# Patient Record
Sex: Female | Born: 1945 | Race: White | Hispanic: No | Smoking: Former smoker
Health system: Southern US, Community
[De-identification: ages and names within clinical notes are randomized; demographics above are authoritative.]

## PROBLEM LIST (undated history)

## (undated) DIAGNOSIS — Z9989 Dependence on other enabling machines and devices: Secondary | ICD-10-CM

## (undated) DIAGNOSIS — I509 Heart failure, unspecified: Secondary | ICD-10-CM

## (undated) DIAGNOSIS — R1310 Dysphagia, unspecified: Secondary | ICD-10-CM

## (undated) DIAGNOSIS — Z720 Tobacco use: Secondary | ICD-10-CM

## (undated) DIAGNOSIS — E119 Type 2 diabetes mellitus without complications: Secondary | ICD-10-CM

## (undated) DIAGNOSIS — I10 Essential (primary) hypertension: Secondary | ICD-10-CM

## (undated) DIAGNOSIS — E785 Hyperlipidemia, unspecified: Secondary | ICD-10-CM

## (undated) DIAGNOSIS — J9690 Respiratory failure, unspecified, unspecified whether with hypoxia or hypercapnia: Secondary | ICD-10-CM

## (undated) DIAGNOSIS — F419 Anxiety disorder, unspecified: Secondary | ICD-10-CM

## (undated) DIAGNOSIS — J449 Chronic obstructive pulmonary disease, unspecified: Secondary | ICD-10-CM

## (undated) DIAGNOSIS — I639 Cerebral infarction, unspecified: Secondary | ICD-10-CM

## (undated) DIAGNOSIS — R600 Localized edema: Secondary | ICD-10-CM

## (undated) HISTORY — PX: FOOT SURGERY: SHX648

## (undated) HISTORY — PX: CATARACT EXTRACTION: SUR2

## (undated) HISTORY — PX: HIP SURGERY: SHX245

---

## 1973-07-23 DIAGNOSIS — I639 Cerebral infarction, unspecified: Secondary | ICD-10-CM

## 1973-07-23 HISTORY — DX: Cerebral infarction, unspecified: I63.9

## 2001-09-19 ENCOUNTER — Inpatient Hospital Stay (HOSPITAL_COMMUNITY): Admission: AC | Admit: 2001-09-19 | Discharge: 2001-10-09 | Payer: Self-pay

## 2001-09-19 ENCOUNTER — Encounter: Payer: Self-pay | Admitting: General Surgery

## 2001-09-20 ENCOUNTER — Encounter: Payer: Self-pay | Admitting: General Surgery

## 2001-10-03 ENCOUNTER — Encounter: Payer: Self-pay | Admitting: Orthopedic Surgery

## 2001-10-09 ENCOUNTER — Inpatient Hospital Stay (HOSPITAL_COMMUNITY)
Admission: RE | Admit: 2001-10-09 | Discharge: 2001-10-23 | Payer: Self-pay | Admitting: Physical Medicine & Rehabilitation

## 2001-10-16 ENCOUNTER — Encounter: Payer: Self-pay | Admitting: Physical Medicine & Rehabilitation

## 2001-10-22 ENCOUNTER — Encounter: Payer: Self-pay | Admitting: Physical Medicine & Rehabilitation

## 2002-02-12 ENCOUNTER — Encounter: Payer: Self-pay | Admitting: Family Medicine

## 2002-02-12 ENCOUNTER — Ambulatory Visit (HOSPITAL_COMMUNITY): Admission: RE | Admit: 2002-02-12 | Discharge: 2002-02-12 | Payer: Self-pay | Admitting: Family Medicine

## 2003-12-07 ENCOUNTER — Ambulatory Visit (HOSPITAL_COMMUNITY): Admission: RE | Admit: 2003-12-07 | Discharge: 2003-12-07 | Payer: Self-pay | Admitting: Internal Medicine

## 2004-04-06 ENCOUNTER — Ambulatory Visit (HOSPITAL_COMMUNITY): Admission: RE | Admit: 2004-04-06 | Discharge: 2004-04-06 | Payer: Self-pay | Admitting: Family Medicine

## 2004-04-30 ENCOUNTER — Inpatient Hospital Stay (HOSPITAL_COMMUNITY): Admission: EM | Admit: 2004-04-30 | Discharge: 2004-05-04 | Payer: Self-pay | Admitting: *Deleted

## 2004-05-03 ENCOUNTER — Ambulatory Visit: Payer: Self-pay | Admitting: Physical Medicine & Rehabilitation

## 2004-06-27 ENCOUNTER — Ambulatory Visit (HOSPITAL_COMMUNITY): Admission: RE | Admit: 2004-06-27 | Discharge: 2004-06-27 | Payer: Self-pay | Admitting: Specialist

## 2004-07-11 ENCOUNTER — Encounter (HOSPITAL_COMMUNITY): Admission: RE | Admit: 2004-07-11 | Discharge: 2004-08-10 | Payer: Self-pay | Admitting: Specialist

## 2006-03-07 ENCOUNTER — Emergency Department (HOSPITAL_COMMUNITY): Admission: EM | Admit: 2006-03-07 | Discharge: 2006-03-07 | Payer: Self-pay | Admitting: Emergency Medicine

## 2006-03-15 ENCOUNTER — Inpatient Hospital Stay (HOSPITAL_COMMUNITY): Admission: EM | Admit: 2006-03-15 | Discharge: 2006-03-22 | Payer: Self-pay | Admitting: Emergency Medicine

## 2006-10-06 ENCOUNTER — Ambulatory Visit: Payer: Self-pay | Admitting: Orthopedic Surgery

## 2006-10-06 ENCOUNTER — Inpatient Hospital Stay (HOSPITAL_COMMUNITY): Admission: EM | Admit: 2006-10-06 | Discharge: 2006-10-14 | Payer: Self-pay | Admitting: Emergency Medicine

## 2006-10-08 ENCOUNTER — Encounter: Payer: Self-pay | Admitting: Orthopedic Surgery

## 2006-12-10 ENCOUNTER — Ambulatory Visit: Payer: Self-pay | Admitting: Orthopedic Surgery

## 2007-02-24 ENCOUNTER — Ambulatory Visit: Payer: Self-pay | Admitting: Orthopedic Surgery

## 2007-07-09 ENCOUNTER — Encounter: Payer: Self-pay | Admitting: Orthopedic Surgery

## 2007-08-01 DIAGNOSIS — T380X5A Adverse effect of glucocorticoids and synthetic analogues, initial encounter: Secondary | ICD-10-CM

## 2007-08-01 DIAGNOSIS — E099 Drug or chemical induced diabetes mellitus without complications: Secondary | ICD-10-CM

## 2007-08-14 ENCOUNTER — Telehealth: Payer: Self-pay | Admitting: Orthopedic Surgery

## 2007-08-14 ENCOUNTER — Encounter: Payer: Self-pay | Admitting: Orthopedic Surgery

## 2007-09-09 ENCOUNTER — Ambulatory Visit: Payer: Self-pay | Admitting: Orthopedic Surgery

## 2007-09-09 DIAGNOSIS — S72143A Displaced intertrochanteric fracture of unspecified femur, initial encounter for closed fracture: Secondary | ICD-10-CM

## 2007-09-09 DIAGNOSIS — M76899 Other specified enthesopathies of unspecified lower limb, excluding foot: Secondary | ICD-10-CM | POA: Insufficient documentation

## 2007-09-09 DIAGNOSIS — M7150 Other bursitis, not elsewhere classified, unspecified site: Secondary | ICD-10-CM

## 2007-09-11 ENCOUNTER — Emergency Department (HOSPITAL_COMMUNITY): Admission: EM | Admit: 2007-09-11 | Discharge: 2007-09-11 | Payer: Self-pay | Admitting: Emergency Medicine

## 2007-10-12 ENCOUNTER — Emergency Department (HOSPITAL_COMMUNITY): Admission: EM | Admit: 2007-10-12 | Discharge: 2007-10-12 | Payer: Self-pay | Admitting: Emergency Medicine

## 2007-12-09 ENCOUNTER — Emergency Department (HOSPITAL_COMMUNITY): Admission: EM | Admit: 2007-12-09 | Discharge: 2007-12-09 | Payer: Self-pay | Admitting: Emergency Medicine

## 2008-01-11 ENCOUNTER — Emergency Department (HOSPITAL_COMMUNITY): Admission: EM | Admit: 2008-01-11 | Discharge: 2008-01-11 | Payer: Self-pay | Admitting: Emergency Medicine

## 2008-01-28 ENCOUNTER — Emergency Department (HOSPITAL_COMMUNITY): Admission: EM | Admit: 2008-01-28 | Discharge: 2008-01-28 | Payer: Self-pay | Admitting: Emergency Medicine

## 2008-02-09 ENCOUNTER — Emergency Department (HOSPITAL_COMMUNITY): Admission: EM | Admit: 2008-02-09 | Discharge: 2008-02-09 | Payer: Self-pay | Admitting: Emergency Medicine

## 2008-02-19 ENCOUNTER — Emergency Department (HOSPITAL_COMMUNITY): Admission: EM | Admit: 2008-02-19 | Discharge: 2008-02-19 | Payer: Self-pay | Admitting: Emergency Medicine

## 2008-03-04 ENCOUNTER — Emergency Department (HOSPITAL_COMMUNITY): Admission: EM | Admit: 2008-03-04 | Discharge: 2008-03-04 | Payer: Self-pay | Admitting: Emergency Medicine

## 2008-03-15 ENCOUNTER — Emergency Department (HOSPITAL_COMMUNITY): Admission: EM | Admit: 2008-03-15 | Discharge: 2008-03-15 | Payer: Self-pay | Admitting: Emergency Medicine

## 2008-03-25 ENCOUNTER — Emergency Department (HOSPITAL_COMMUNITY): Admission: EM | Admit: 2008-03-25 | Discharge: 2008-03-25 | Payer: Self-pay | Admitting: Emergency Medicine

## 2008-04-04 ENCOUNTER — Emergency Department (HOSPITAL_COMMUNITY): Admission: EM | Admit: 2008-04-04 | Discharge: 2008-04-04 | Payer: Self-pay | Admitting: Emergency Medicine

## 2008-04-17 ENCOUNTER — Emergency Department (HOSPITAL_COMMUNITY): Admission: EM | Admit: 2008-04-17 | Discharge: 2008-04-17 | Payer: Self-pay | Admitting: Emergency Medicine

## 2008-05-07 ENCOUNTER — Emergency Department (HOSPITAL_COMMUNITY): Admission: EM | Admit: 2008-05-07 | Discharge: 2008-05-07 | Payer: Self-pay | Admitting: Emergency Medicine

## 2008-05-20 ENCOUNTER — Emergency Department (HOSPITAL_COMMUNITY): Admission: EM | Admit: 2008-05-20 | Discharge: 2008-05-20 | Payer: Self-pay | Admitting: Emergency Medicine

## 2008-07-22 ENCOUNTER — Emergency Department (HOSPITAL_COMMUNITY): Admission: EM | Admit: 2008-07-22 | Discharge: 2008-07-22 | Payer: Self-pay | Admitting: Emergency Medicine

## 2008-08-09 ENCOUNTER — Emergency Department (HOSPITAL_COMMUNITY): Admission: EM | Admit: 2008-08-09 | Discharge: 2008-08-09 | Payer: Self-pay | Admitting: Emergency Medicine

## 2008-09-08 ENCOUNTER — Emergency Department (HOSPITAL_COMMUNITY): Admission: EM | Admit: 2008-09-08 | Discharge: 2008-09-08 | Payer: Self-pay | Admitting: Emergency Medicine

## 2008-09-20 ENCOUNTER — Emergency Department (HOSPITAL_COMMUNITY): Admission: EM | Admit: 2008-09-20 | Discharge: 2008-09-20 | Payer: Self-pay | Admitting: Emergency Medicine

## 2008-09-30 ENCOUNTER — Emergency Department (HOSPITAL_COMMUNITY): Admission: EM | Admit: 2008-09-30 | Discharge: 2008-09-30 | Payer: Self-pay | Admitting: Emergency Medicine

## 2009-10-17 ENCOUNTER — Ambulatory Visit (HOSPITAL_COMMUNITY): Admission: RE | Admit: 2009-10-17 | Discharge: 2009-10-17 | Payer: Self-pay | Admitting: Family Medicine

## 2009-11-01 ENCOUNTER — Encounter: Admission: RE | Admit: 2009-11-01 | Discharge: 2009-11-01 | Payer: Self-pay | Admitting: Family Medicine

## 2010-08-13 ENCOUNTER — Encounter: Payer: Self-pay | Admitting: Family Medicine
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
## 2010-12-08 NOTE — Op Note (Signed)
[facility]  Patient:    Tara Park, Tara Park Visit Number: [id number] MRN: [id number]          Service Type: SUR Location: 4W 0471 [id number] Attending Physician:  Verlee Rossetti. Dictated by:   Sherri Rad, M.D. Proc. Date: 10/03/01 Admit Date:  10/03/2001                             Operative Report  PREOPERATIVE DIAGNOSIS:  Closed right calcaneus fracture, intraarticular, Sanders III.  POSTOPERATIVE DIAGNOSIS:  Closed right calcaneus fracture, intraarticular, Sanders III.  OPERATION:  Open reduction, internal fixation, right intraarticular calcaneus fracture.  ANESTHESIA:  General endotracheal tube.  SURGEON:  Sherri Rad, M.D.  ASSISTANT:  Almedia Balls. Ranell Patrick, M.D.  ESTIMATED BLOOD LOSS:  Minimal.  TOURNIQUET TIME:  Two hours.  COMPLICATIONS:  None.  DISPOSITION:  Stable to PAR.  INDICATIONS:  This is a 65 year old female, who sustained a right calcaneus fracture as well as a tibial plateau fracture.  The tibial plateau fracture is being managed by Dr. Ranell Patrick, currently conservatively.  As for the right calcaneus fracture, she was explained the risks of the procedure which include infection, neurovascular injury, wound breakdown especially if she continues to smoke, stiffness, arthritis, DVT, and possible PE were all explained. Questions were answered.  DESCRIPTION OF OPERATION:  The patient brought to the operating room and placed initially in the supine position.  After adequate general endotracheal tube anesthesia was administered as well as Ancef 1 g IV piggyback, the patient was then placed in the lateral decubitus position with the operative site up after a proximal thigh tourniquet was placed in the right lower extremity.  The right lower extremity was then prepped and draped in a sterile manner after all bony prominences were well-padded.  We then made an L-shaped extensile approach to the calcaneus.  Sural nerve was identified  and protected throughout the entire procedure.  Peroneal tendons were identified distally and protected as well.  A large full-thickness flap off the calcaneus was then elevated, taking care to protect the soft tissues of the flap.  Two 1.6 mm K-wires were then placed in the lateral aspect of the talus and bent up to protect the flap, and the flap was not touched through the remaining portion of the procedure.  The lateral wall of the calcaneus was removed, and there was a depressed lateral portion of the posterior facet that was removed as well for later fixation.  There was an extremely large portion of the posterior facet that was no perpendicular to the bottom of the foot that was depressed.  This was then elevated and freed up as well.  We then identified the primary fracture line, freed this up as well.  We then reduced the calcaneal tuber and fired two 1.6 mm K-wires to reduce the tuber back to the medial wall.  This was visualized under C-arm guidance in the axial of the hindfoot as well as lateral planes of the hindfoot.  Once this was done, we then reduced the large portion of the posterior facet to the medial portion of the posterior facet.  This was then reduced visually and two 1.6 mm K-wires were then placed.  This was visualized under C-arm guidance in the lateral Brodens view as well as axial view of the calcaneus.  The two K-wires were then cut flush with the bone, and then the lateral portion of the posterior facet was then  placed and held reduced with K-wire.  We then placed a 3.5 mm fully-threaded cortical lag screw into this piece and had excellent purchase and compression.  The joint was visualized, and it was anatomic.  We then placed approximately 10 cc of cancellous croutons into the large opened space within the calcaneus.  Once this was done, we then reduced the angled Glissanes fragment to the posterior facet, and a 1.6 mm K-wire was fired, not only axially but  also transversely which held this fragment anatomic.  We then fashioned a Letournel plate to the lateral side of the wall.  We then placed this on the back table.  We then placed the lateral wall piece back to the calcaneus and then applied the plate.  We then placed several 3.5 mm fully-threaded cortical screws in a lag fashion, and this compressed the lateral wall beautifully.  Once this was done, we then cut flush the posterior facet K-wire as well as the angled Glissanes fragment K-wire fixation.  We copiously irrigated the wound with normal saline.  Final x-rays were taken in the lateral axial views of the hindfoot as well as Brodens views of the subtalar joint as well.  The subtalar joint was visualized, and it was anatomically reduced as well.  Once the wound was copiously irrigated with normal saline, the tourniquet was deflated, and bleeding was minimal.  We then closed the deep, soft tissues with 2-0 Vicryl in a series of sutures and then closed them to apply universal pressure over the entire flap as we closed it. The skin was closed with Alcauer-Donati suture technique.  Prior to the procedure, the skin had excellent wrinkles in the skin.  There was ecchymosis. There was no fracture blisters over the lateral aspect of the foot as well. Sterile dressing was applied.  We then obtained an AP and lateral view of the tibial plateau, and there was essentially no change in its alignment from preoperatively.  We then applied a long leg Jones dressing, and patient was stable to the PAR. Dictated by:   Sherri Rad, M.D. Attending Physician:  Malon Kindle R. DD:  10/03/01 TD:  10/06/01 Job: [id number]
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
## 2010-12-08 NOTE — Discharge Summary (Signed)
NAME:  Tara Park, Tara Park               ACCOUNT NO.:  [id number]   MEDICAL RECORD NO.:  [id number]          PATIENT TYPE:  INP   LOCATION:  A201                          FACILITY:  [facility]   PHYSICIAN:  Madelin Rear. Sherwood Gambler, MD  DATE OF BIRTH:  1945-12-26   DATE OF ADMISSION:  03/15/2006  DATE OF DISCHARGE:  08/31/2007LH                                 DISCHARGE SUMMARY   DISCHARGE DIAGNOSES:  1. Acute exacerbation of chronic obstructive pulmonary disease.  2. Respiratory insufficiency.  3. Noninsulin-dependent diabetes mellitus.  4. Tobacco use disorder.  5. Acute bronchitis.  6. Hypokalemia   DISCHARGE MEDICATIONS:  1. Medrol Dosepak.  2. Zithromax 500 mg p.o. daily.  3. Diflucan 100 mg p.o. daily.  4. Theophylline 300 mg p.o. b.i.d.  5. Actos, as previously taking.  6. Metformin, as previously taking.   SUMMARY:  The patient was admitted with respiratory difficulty.  She was  noted to have diffuse inspiratory and expiratory wheezing and prolonged  expiratory phase consistent with acute exacerbation of COPD.  She suffered  mild hypokalemia in-house; however, IV bronchodilators and IV steroids as  well as round-the-clock nebulizers improved her situation.  Her bronchitis  was treated with antibiotics primarily and subsequently changed to p.o.  therapy.  She was discharged for followup in my office in 1 week back to  baseline.      Madelin Rear. Sherwood Gambler, MD  Electronically Signed     LJF/MEDQ  D:  04/18/2006  T:  04/18/2006  Job:  [id number]
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
## 2010-12-08 NOTE — Group Therapy Note (Signed)
NAME:  Tara Park, Tara Park               ACCOUNT NO.:  [id number]   MEDICAL RECORD NO.:  [id number]          PATIENT TYPE:  INP   LOCATION:  A314                          FACILITY:  [facility]   PHYSICIAN:  Vickki Hearing, M.D.DATE OF BIRTH:  05/25/1946   DATE OF PROCEDURE:  DATE OF DISCHARGE:                                 PROGRESS NOTE   Ms. Louissaint had a gamma nail placed for right  basicervical/intertrochanteric fracture on October 08, 2006.  She is doing  well.  She has progressed to be a walk 30 feet, partial weightbearing on  the right leg.  She was stable for discharge but does not have a  disposition at this time.  This will be worked on next week.  She is  uninsured.  She is not qualified for Medicare today.  Today, I stopped  her Rocephin and Zithromax, pulled her IV out.  I did leave her IM  morphine 4 mg q.2 h. p.r.n.  She also has oral pain medicines ordered.  Her wound looks fine today.  The staples can be removed on the 28th if  the patient is still in the hospital.  I need to see her in the office  the week of April 7 for a checkup.  Please send a copy to Dr. Sanjuan Dame  office as he will be covering in my absence.      Vickki Hearing, M.D.  Electronically Signed     SEH/MEDQ  D:  10/12/2006  T:  10/12/2006  Job:  [id number]   cc:   Teola Bradley, M.D.  Fax: [phone]
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
## 2010-12-08 NOTE — H&P (Signed)
NAME:  Tara Park, Tara Park               ACCOUNT NO.:  [id number]   MEDICAL RECORD NO.:  [id number]          PATIENT TYPE:  INP   LOCATION:  IC10                          FACILITY:  [facility]   PHYSICIAN:  Madelin Rear. Sherwood Gambler, MD  DATE OF BIRTH:  09/05/1945   DATE OF ADMISSION:  03/15/2006  DATE OF DISCHARGE:  LH                                HISTORY & PHYSICAL   CHIEF COMPLAINT:  Shortness of breath.   HISTORY OF PRESENT ILLNESS:  The patient had progressively increasing  shortness of breath. She states this was proceeded by rather heated  discussion with her insurance company regarding nonpayment for  prescriptions. She had some recent sinus drainage which may have contributed  to this starting two days prior to it. She got progressively more short of  breath and presented to the emergency department where she was found to be  in distress. She denies any chest pain. She has had a cough that has been  nonproductive.   PAST MEDICAL HISTORY:  1. Noninsulin-dependent diabetes mellitus.  2. Status post surgical repair of fractures.  3. Incidental spleen laceration.   ALLERGIES:  SHE IS NOTED TO BE ALLERGIC TO SULFA AND KEFLEX.   SOCIAL HISTORY:  Positive cigarette smoker. Occasional alcohol use. No  illicit drug use.   FAMILY HISTORY:  Noncontributory for this illness.   REVIEW OF SYSTEMS:  As under HPI. All else is negative.   PHYSICAL EXAMINATION:  GENERAL:  Acutely dyspneic.  HEAD AND NECK:  No JVD or adenopathy. Neck was supple.  CHEST:  Diffuse inspiratory and expiratory wheezing.  CARDIAC:  Regular rhythm. Tachycardic without gallop or rub.  ABDOMEN:  Soft. No organomegaly or masses.  EXTREMITIES:  Without clubbing, cyanosis, or edema.  NEUROLOGICAL:  Nonfocal.   LABORATORY DATA:  Chest x-ray revealed no acute infiltrates or disease. CT  chest was obtained and verbally reported to me as negative for pulmonary  embolus. Electrolytes revealed mild hypokalemia but otherwise  unremarkable.  CBC:  Modest elevation of white count with a slight left shift.   IMPRESSION:  1. Acute exacerbation of chronic obstructive pulmonary disease on no      bronchodilators. The patient will be admitted for monitoring.      Parenteral bronchodilators and around-the-clock nebulizers and      steroids. Antibiotics      will be administered as well to cover atypical and typical community-      acquired infections.  2. Diabetes mellitus. Monitor sugars on steroids. Insulin coverage as      needed. Will maintain her oral hyperglycemic medication.      Madelin Rear. Sherwood Gambler, MD  Electronically Signed     LJF/MEDQ  D:  03/16/2006  T:  03/16/2006  Job:  [id number]
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
## 2012-12-11 ENCOUNTER — Emergency Department (HOSPITAL_COMMUNITY): Payer: Medicare Other

## 2012-12-11 ENCOUNTER — Emergency Department (HOSPITAL_COMMUNITY)
Admission: EM | Admit: 2012-12-11 | Discharge: 2012-12-11 | Disposition: A | Discharge status: Home / Self Care | Payer: Medicare Other | Attending: Emergency Medicine | Admitting: Emergency Medicine

## 2012-12-11 DIAGNOSIS — Y939 Activity, unspecified: Secondary | ICD-10-CM | POA: Insufficient documentation

## 2012-12-11 DIAGNOSIS — Z9889 Other specified postprocedural states: Secondary | ICD-10-CM | POA: Insufficient documentation

## 2012-12-11 DIAGNOSIS — Y92009 Unspecified place in unspecified non-institutional (private) residence as the place of occurrence of the external cause: Secondary | ICD-10-CM | POA: Insufficient documentation

## 2012-12-11 DIAGNOSIS — M5144 Schmorl's nodes, thoracic region: Secondary | ICD-10-CM | POA: Insufficient documentation

## 2012-12-11 DIAGNOSIS — S32000D Wedge compression fracture of unspecified lumbar vertebra, subsequent encounter for fracture with routine healing: Secondary | ICD-10-CM

## 2012-12-11 DIAGNOSIS — M5137 Other intervertebral disc degeneration, lumbosacral region: Secondary | ICD-10-CM | POA: Insufficient documentation

## 2012-12-11 DIAGNOSIS — M51379 Other intervertebral disc degeneration, lumbosacral region without mention of lumbar back pain or lower extremity pain: Secondary | ICD-10-CM | POA: Insufficient documentation

## 2012-12-11 DIAGNOSIS — M5136 Other intervertebral disc degeneration, lumbar region: Secondary | ICD-10-CM

## 2012-12-11 DIAGNOSIS — R296 Repeated falls: Secondary | ICD-10-CM | POA: Insufficient documentation

## 2012-12-11 DIAGNOSIS — S32009A Unspecified fracture of unspecified lumbar vertebra, initial encounter for closed fracture: Secondary | ICD-10-CM | POA: Insufficient documentation

## 2012-12-11 DIAGNOSIS — S20229A Contusion of unspecified back wall of thorax, initial encounter: Secondary | ICD-10-CM

## 2012-12-11 DIAGNOSIS — R609 Edema, unspecified: Secondary | ICD-10-CM | POA: Insufficient documentation

## 2012-12-11 DIAGNOSIS — L97409 Non-pressure chronic ulcer of unspecified heel and midfoot with unspecified severity: Secondary | ICD-10-CM | POA: Insufficient documentation

## 2012-12-11 MED ORDER — OXYCODONE-ACETAMINOPHEN 5-325 MG PO TABS: 1.0000 | ORAL_TABLET | ORAL | Status: DC | PRN

## 2012-12-11 MED ORDER — OXYCODONE-ACETAMINOPHEN 5-325 MG PO TABS
1.0000 | ORAL_TABLET | Freq: Once | ORAL | Status: AC
  Administered 2012-12-11: 1 via ORAL
  Filled 2012-12-11: qty 1

## 2012-12-11 NOTE — ED Provider Notes (Signed)
History    This chart was scribed for Flint Melter, MD by Quintella Reichert, ED scribe.  This patient was seen in room APA05/APA05 and the patient's care was started at 2:50 PM.  CSN: [id number]  Arrival date & time 12/11/12  1357      Chief Complaint  Patient presents with  . Fall     The history is provided by the patient. No language interpreter was used.    HPI Comments: Tara Park is a 67 y.o. female who presents to the Emergency Department complaining of a fall that occurred 2 weeks ago, with subsequent constant lower back and bilateral hip pain.  Pt states that she landed on her lower back and hips.  She denies head impact or LOC.  Pain is described as exacerbated by walking and by some movements while lying down or standing, and she states the pain radiates intermittently into posterior left mid-thigh.  Hip pain is worse on left side than right.  She has attempted to treat pain with ibuprofen, without relief.  She denies numbness or tingling or decreased appetite.  She also reports chronic mild urinary and bowel leakage but states it is not presently above baseline.  Pt denies h/o back surgery.  She admits to h/o surgery on foot, gall bladder, tonsils, hip, and left knee.  Pt goes to [facility].  Pt also reports severe, constant, gradually-worsening swelling to the bilateral lower calves and feet that began 2 weeks ago, with accompanying drainage.  She notes that she smokes occasionally.  PCP is Dr. Janna Arch   No past medical history on file.  No past surgical history on file.  No family history on file.  History  Substance Use Topics  . Smoking status: Not on file  . Smokeless tobacco: Not on file  . Alcohol Use: Not on file    OB History   No data available      Review of Systems A complete 10 system review of systems was obtained and all systems are negative except as noted in the HPI and PMH.    Allergies  Review of patient's allergies  indicates no known allergies.  Home Medications   Current Outpatient Rx  Name  Route  Sig  Dispense  Refill  . albuterol (PROAIR HFA) 108 (90 BASE) MCG/ACT inhaler   Inhalation   Inhale 2 puffs into the lungs every 6 (six) hours as needed for wheezing or shortness of breath.         Marland Kitchen albuterol (PROVENTIL) (2.5 MG/3ML) 0.083% nebulizer solution   Nebulization   Take 2.5 mg by nebulization every 6 (six) hours as needed for wheezing or shortness of breath.         . ciprofloxacin (CIPRO) 500 MG tablet   Oral   Take 500 mg by mouth 2 (two) times daily.         Marland Kitchen ibuprofen (ADVIL,MOTRIN) 200 MG tablet   Oral   Take 400 mg by mouth every 6 (six) hours as needed for pain.         Marland Kitchen lisinopril (PRINIVIL,ZESTRIL) 10 MG tablet   Oral   Take 10 mg by mouth every morning.         . metFORMIN (GLUCOPHAGE) 500 MG tablet   Oral   Take 500 mg by mouth 2 (two) times daily with a meal.         . mometasone (NASONEX) 50 MCG/ACT nasal spray   Nasal   Place 1  spray into the nose 2 (two) times daily.         . pravastatin (PRAVACHOL) 40 MG tablet   Oral   Take 40 mg by mouth every evening.         Marland Kitchen oxyCODONE-acetaminophen (PERCOCET/ROXICET) 5-325 MG per tablet   Oral   Take 1 tablet by mouth every 4 (four) hours as needed for pain.   30 tablet   0     BP 153/55  Pulse 97  Temp(Src) 97.8 F (36.6 C) (Oral)  Resp 20  Ht 5\' 4"  (1.626 m)  Wt 139 lb (63.05 kg)  BMI 23.85 kg/m2  SpO2 98%  Physical Exam  Nursing note and vitals reviewed. Constitutional: She is oriented to person, place, and time. She appears well-developed and well-nourished.  HENT:  Head: Normocephalic and atraumatic.  Eyes: Conjunctivae and EOM are normal. Pupils are equal, round, and reactive to light.  Neck: Normal range of motion and phonation normal. Neck supple.  Cardiovascular: Normal rate, regular rhythm and intact distal pulses.   Pulmonary/Chest: Effort normal and breath sounds normal.  She exhibits no tenderness.  Decreased air movement bilaterally  Abdominal: Soft. She exhibits no distension. There is no tenderness. There is no guarding.  Musculoskeletal: Normal range of motion. She exhibits edema (3+ edema to right lower leg, 2+ to left lower leg.  Asymmetry normal for pt.) and tenderness (Thoraco-lumbar kyphosis.  No lumbar or thoracic tenderness.).  Neurological: She is alert and oriented to person, place, and time. She has normal strength. She exhibits normal muscle tone.  Skin: Skin is warm and dry.  Right lateral heel has a chronic appearing wound, draining a small amount of serous discharge, no associated discharge or swelling.  Psychiatric: She has a normal mood and affect. Her behavior is normal. Judgment and thought content normal.    ED Course  Procedures (including critical care time)  DIAGNOSTIC STUDIES: Oxygen Saturation is 98% on room air, normal by my interpretation.    COORDINATION OF CARE: 2:50 PM-Discussed treatment plan which includes imaging and pain medication with pt at bedside and pt agreed to plan.     5:15 PM Reevaluation with update and discussion. After initial assessment and treatment, an updated evaluation reveals Pt feels better, able to ambulate with decreased pain. Repeat vitals normal. Radiology findings discussed with pt. Mancel Bale L    Dg Lumbar Spine Complete  12/11/2012   *RADIOLOGY REPORT*  Clinical Data: Fall approximately 2 weeks ago, persistent low back pain.  LUMBAR SPINE - COMPLETE 4+ VIEW  Comparison: Lumbar spine x-rays 08/09/2008.  Findings: Five non-rib bearing lumbar vertebrae.  Degenerative grade 1 spondylolisthesis of L4 on L5 approximating 11 mm.  Stable compression fractures involving T11, T12, L1, L3, and L5.  No new compression fractures.  Relatively well-preserved disc spaces. Diffuse facet degenerative changes.  Generalized osseous demineralization.  Visualized sacroiliac joints intact with degenerative  changes.  IMPRESSION: No acute osseous abnormality.  Stable compression fractures of T11, T12, L1, L3, and L5 since January, 2010.  Degenerative grade 1 spondylolisthesis of L4 on L5 approximating 11 mm, increased since the prior examination.   Original Report Authenticated By: Hulan Saas, M.D.     1. Fall at home, initial encounter   2. Contusion, back, unspecified laterality, initial encounter   3. Degenerative disc disease, lumbar   4. Compression fracture of lumbar vertebra, with routine healing, subsequent encounter       MDM  Subacute fall with persistent, ongoing, low back pain.  She's improved with analgesia in the ED. Plain film imaging does not show increased fractures or significant change in her chronic lower back degenerative joint disease. She stable for discharge, with symptomatic treatment.   Nursing Notes Reviewed/ Care Coordinated Applicable Imaging Reviewed. Radiologic imaging report reviewed and images by Radiography  - viewed, by me. Interpretation of Laboratory Data incorporated into ED treatment    I personally performed the services described in this documentation, which was scribed in my presence. The recorded information has been reviewed and is accurate.     Flint Melter, MD 12/11/12 2132

## 2012-12-11 NOTE — ED Notes (Signed)
Pt ambulated in hall independently with cane. States she feels better. nad noted.

## 2012-12-11 NOTE — ED Notes (Signed)
States that she fell about 2 weeks ago and landed on her back.  States that she is having worsening lower back pain and bilateral hip pain that is made worse with ambulation.

## 2013-05-12 ENCOUNTER — Other Ambulatory Visit (HOSPITAL_COMMUNITY): Payer: Self-pay | Admitting: Family Medicine

## 2013-05-12 ENCOUNTER — Ambulatory Visit (HOSPITAL_COMMUNITY)
Admission: RE | Admit: 2013-05-12 | Discharge: 2013-05-12 | Disposition: A | Discharge status: Home / Self Care | Payer: Medicare Other | Source: Ambulatory Visit | Attending: Family Medicine | Admitting: Family Medicine

## 2013-05-12 DIAGNOSIS — J449 Chronic obstructive pulmonary disease, unspecified: Secondary | ICD-10-CM

## 2013-05-12 DIAGNOSIS — R05 Cough: Secondary | ICD-10-CM | POA: Insufficient documentation

## 2013-05-12 DIAGNOSIS — R918 Other nonspecific abnormal finding of lung field: Secondary | ICD-10-CM | POA: Insufficient documentation

## 2013-05-12 DIAGNOSIS — J438 Other emphysema: Secondary | ICD-10-CM | POA: Insufficient documentation

## 2013-05-12 DIAGNOSIS — R059 Cough, unspecified: Secondary | ICD-10-CM | POA: Insufficient documentation

## 2013-07-31 ENCOUNTER — Ambulatory Visit (HOSPITAL_COMMUNITY)
Admission: RE | Admit: 2013-07-31 | Discharge: 2013-07-31 | Disposition: A | Discharge status: Home / Self Care | Payer: Medicare Other | Source: Ambulatory Visit | Attending: Podiatry | Admitting: Podiatry

## 2013-07-31 ENCOUNTER — Other Ambulatory Visit (HOSPITAL_COMMUNITY): Payer: Self-pay | Admitting: Podiatry

## 2013-07-31 DIAGNOSIS — R609 Edema, unspecified: Secondary | ICD-10-CM

## 2013-07-31 DIAGNOSIS — M79609 Pain in unspecified limb: Secondary | ICD-10-CM | POA: Insufficient documentation

## 2013-07-31 DIAGNOSIS — M7989 Other specified soft tissue disorders: Secondary | ICD-10-CM | POA: Insufficient documentation

## 2015-05-31 ENCOUNTER — Inpatient Hospital Stay (HOSPITAL_COMMUNITY): Payer: Medicare Other

## 2015-05-31 ENCOUNTER — Emergency Department (HOSPITAL_COMMUNITY): Payer: Medicare Other

## 2015-05-31 ENCOUNTER — Encounter (HOSPITAL_COMMUNITY): Payer: Self-pay | Admitting: Emergency Medicine

## 2015-05-31 ENCOUNTER — Inpatient Hospital Stay (HOSPITAL_COMMUNITY)
Admission: EM | Admit: 2015-05-31 | Discharge: 2015-06-10 | DRG: 065 | Disposition: A | Discharge status: Skilled Nursing Facility | Payer: Medicare Other | Attending: Family Medicine | Admitting: Family Medicine

## 2015-05-31 DIAGNOSIS — Z7984 Long term (current) use of oral hypoglycemic drugs: Secondary | ICD-10-CM | POA: Diagnosis not present

## 2015-05-31 DIAGNOSIS — Z8249 Family history of ischemic heart disease and other diseases of the circulatory system: Secondary | ICD-10-CM | POA: Diagnosis not present

## 2015-05-31 DIAGNOSIS — J449 Chronic obstructive pulmonary disease, unspecified: Secondary | ICD-10-CM

## 2015-05-31 DIAGNOSIS — E119 Type 2 diabetes mellitus without complications: Secondary | ICD-10-CM

## 2015-05-31 DIAGNOSIS — G8194 Hemiplegia, unspecified affecting left nondominant side: Secondary | ICD-10-CM | POA: Diagnosis present

## 2015-05-31 DIAGNOSIS — F17210 Nicotine dependence, cigarettes, uncomplicated: Secondary | ICD-10-CM | POA: Diagnosis not present

## 2015-05-31 DIAGNOSIS — I255 Ischemic cardiomyopathy: Secondary | ICD-10-CM | POA: Diagnosis not present

## 2015-05-31 DIAGNOSIS — I639 Cerebral infarction, unspecified: Principal | ICD-10-CM | POA: Diagnosis present

## 2015-05-31 DIAGNOSIS — R059 Cough, unspecified: Secondary | ICD-10-CM

## 2015-05-31 DIAGNOSIS — Z825 Family history of asthma and other chronic lower respiratory diseases: Secondary | ICD-10-CM

## 2015-05-31 DIAGNOSIS — R531 Weakness: Secondary | ICD-10-CM | POA: Diagnosis present

## 2015-05-31 DIAGNOSIS — R0602 Shortness of breath: Secondary | ICD-10-CM

## 2015-05-31 DIAGNOSIS — I6350 Cerebral infarction due to unspecified occlusion or stenosis of unspecified cerebral artery: Secondary | ICD-10-CM | POA: Diagnosis not present

## 2015-05-31 DIAGNOSIS — I10 Essential (primary) hypertension: Secondary | ICD-10-CM | POA: Diagnosis present

## 2015-05-31 DIAGNOSIS — J441 Chronic obstructive pulmonary disease with (acute) exacerbation: Secondary | ICD-10-CM | POA: Diagnosis present

## 2015-05-31 DIAGNOSIS — R29898 Other symptoms and signs involving the musculoskeletal system: Secondary | ICD-10-CM

## 2015-05-31 DIAGNOSIS — Z720 Tobacco use: Secondary | ICD-10-CM | POA: Diagnosis present

## 2015-05-31 DIAGNOSIS — E785 Hyperlipidemia, unspecified: Secondary | ICD-10-CM | POA: Diagnosis present

## 2015-05-31 DIAGNOSIS — R05 Cough: Secondary | ICD-10-CM

## 2015-05-31 HISTORY — DX: Tobacco use: Z72.0

## 2015-05-31 HISTORY — DX: Localized edema: R60.0

## 2015-05-31 HISTORY — DX: Dependence on other enabling machines and devices: Z99.89

## 2015-05-31 HISTORY — DX: Essential (primary) hypertension: I10

## 2015-05-31 HISTORY — DX: Type 2 diabetes mellitus without complications: E11.9

## 2015-05-31 LAB — I-STAT CHEM 8, ED
BUN: 30 mg/dL — ABNORMAL HIGH (ref 6–20)
CREATININE: 0.7 mg/dL (ref 0.44–1.00)
Calcium, Ion: 1.06 mmol/L — ABNORMAL LOW (ref 1.13–1.30)
Chloride: 107 mmol/L (ref 101–111)
GLUCOSE: 95 mg/dL (ref 65–99)
HCT: 51 % — ABNORMAL HIGH (ref 36.0–46.0)
HEMOGLOBIN: 17.3 g/dL — AB (ref 12.0–15.0)
POTASSIUM: 7.2 mmol/L — AB (ref 3.5–5.1)
Sodium: 139 mmol/L (ref 135–145)
TCO2: 25 mmol/L (ref 0–100)

## 2015-05-31 LAB — DIFFERENTIAL
BASOS ABS: 0.1 10*3/uL (ref 0.0–0.1)
Basophils Relative: 1 %
EOS ABS: 0.3 10*3/uL (ref 0.0–0.7)
Eosinophils Relative: 4 %
LYMPHS ABS: 1.5 10*3/uL (ref 0.7–4.0)
LYMPHS PCT: 20 %
MONOS PCT: 8 %
Monocytes Absolute: 0.6 10*3/uL (ref 0.1–1.0)
NEUTROS ABS: 5 10*3/uL (ref 1.7–7.7)
Neutrophils Relative %: 67 %

## 2015-05-31 LAB — COMPREHENSIVE METABOLIC PANEL
ALK PHOS: 126 U/L (ref 38–126)
ALT: 23 U/L (ref 14–54)
AST: 24 U/L (ref 15–41)
Albumin: 3.6 g/dL (ref 3.5–5.0)
Anion gap: 6 (ref 5–15)
BILIRUBIN TOTAL: 0.4 mg/dL (ref 0.3–1.2)
BUN: 20 mg/dL (ref 6–20)
CALCIUM: 8.8 mg/dL — AB (ref 8.9–10.3)
CO2: 26 mmol/L (ref 22–32)
CREATININE: 0.77 mg/dL (ref 0.44–1.00)
Chloride: 108 mmol/L (ref 101–111)
GFR calc Af Amer: >60 mL/min (ref >60)
Glucose, Bld: 101 mg/dL — ABNORMAL HIGH (ref 65–99)
Potassium: 4.4 mmol/L (ref 3.5–5.1)
Sodium: 140 mmol/L (ref 135–145)
TOTAL PROTEIN: 6.9 g/dL (ref 6.5–8.1)

## 2015-05-31 LAB — RAPID URINE DRUG SCREEN, HOSP PERFORMED
Amphetamines: NOT DETECTED
BARBITURATES: NOT DETECTED
Benzodiazepines: NOT DETECTED
Cocaine: NOT DETECTED
Opiates: NOT DETECTED
TETRAHYDROCANNABINOL: POSITIVE — AB

## 2015-05-31 LAB — CBC
HEMATOCRIT: 46.2 % — AB (ref 36.0–46.0)
HEMOGLOBIN: 16.1 g/dL — AB (ref 12.0–15.0)
MCH: 27.8 pg (ref 26.0–34.0)
MCHC: 34.8 g/dL (ref 30.0–36.0)
MCV: 79.8 fL (ref 78.0–100.0)
Platelets: 201 10*3/uL (ref 150–400)
RBC: 5.79 MIL/uL — ABNORMAL HIGH (ref 3.87–5.11)
RDW: 14.1 % (ref 11.5–15.5)
WBC: 7.4 10*3/uL (ref 4.0–10.5)

## 2015-05-31 LAB — URINALYSIS, ROUTINE W REFLEX MICROSCOPIC
BILIRUBIN URINE: NEGATIVE
Glucose, UA: NEGATIVE mg/dL
Hgb urine dipstick: NEGATIVE
KETONES UR: NEGATIVE mg/dL
LEUKOCYTES UA: NEGATIVE
NITRITE: NEGATIVE
PROTEIN: NEGATIVE mg/dL
Specific Gravity, Urine: 1.01 (ref 1.005–1.030)
Urobilinogen, UA: 0.2 mg/dL (ref 0.0–1.0)
pH: 6.5 (ref 5.0–8.0)

## 2015-05-31 LAB — PROTIME-INR
INR: 1.11 (ref 0.00–1.49)
Prothrombin Time: 14.5 seconds (ref 11.6–15.2)

## 2015-05-31 LAB — I-STAT TROPONIN, ED: TROPONIN I, POC: 0.01 ng/mL (ref 0.00–0.08)

## 2015-05-31 LAB — ETHANOL: Alcohol, Ethyl (B): <5 mg/dL (ref <5)

## 2015-05-31 LAB — APTT: APTT: 27 s (ref 24–37)

## 2015-05-31 MED ORDER — INSULIN ASPART 100 UNIT/ML ~~LOC~~ SOLN
0.0000 [IU] | Freq: Three times a day (TID) | SUBCUTANEOUS | Status: DC
  Administered 2015-06-01 – 2015-06-04 (×8): 1 [IU] via SUBCUTANEOUS
  Administered 2015-06-04: 2 [IU] via SUBCUTANEOUS
  Administered 2015-06-04: 1 [IU] via SUBCUTANEOUS
  Administered 2015-06-05: 2 [IU] via SUBCUTANEOUS
  Administered 2015-06-05 (×2): 1 [IU] via SUBCUTANEOUS
  Administered 2015-06-06: 2 [IU] via SUBCUTANEOUS
  Administered 2015-06-06 (×2): 1 [IU] via SUBCUTANEOUS
  Administered 2015-06-07 – 2015-06-08 (×4): 2 [IU] via SUBCUTANEOUS
  Administered 2015-06-08: 1 [IU] via SUBCUTANEOUS
  Administered 2015-06-08 – 2015-06-09 (×2): 2 [IU] via SUBCUTANEOUS
  Administered 2015-06-09: 3 [IU] via SUBCUTANEOUS
  Administered 2015-06-09 – 2015-06-10 (×3): 2 [IU] via SUBCUTANEOUS

## 2015-05-31 MED ORDER — AZITHROMYCIN 250 MG PO TABS
250.0000 mg | ORAL_TABLET | Freq: Every day | ORAL | Status: AC
  Administered 2015-06-02 – 2015-06-05 (×4): 250 mg via ORAL
  Filled 2015-05-31 (×4): qty 1

## 2015-05-31 MED ORDER — HYDROCODONE-ACETAMINOPHEN 7.5-325 MG PO TABS
1.0000 | ORAL_TABLET | Freq: Four times a day (QID) | ORAL | Status: DC | PRN
  Administered 2015-06-09 (×2): 1 via ORAL
  Filled 2015-05-31 (×2): qty 1

## 2015-05-31 MED ORDER — METHYLPREDNISOLONE SODIUM SUCC 125 MG IJ SOLR
60.0000 mg | Freq: Every day | INTRAMUSCULAR | Status: DC
  Administered 2015-06-01 – 2015-06-02 (×3): 60 mg via INTRAVENOUS
  Filled 2015-05-31 (×3): qty 2

## 2015-05-31 MED ORDER — IPRATROPIUM-ALBUTEROL 0.5-2.5 (3) MG/3ML IN SOLN
3.0000 mL | RESPIRATORY_TRACT | Status: DC
  Administered 2015-05-31: 3 mL via RESPIRATORY_TRACT
  Filled 2015-05-31: qty 3

## 2015-05-31 MED ORDER — NAPROXEN 250 MG PO TABS
500.0000 mg | ORAL_TABLET | Freq: Two times a day (BID) | ORAL | Status: DC
  Administered 2015-06-01 – 2015-06-03 (×6): 500 mg via ORAL
  Filled 2015-05-31 (×6): qty 2

## 2015-05-31 MED ORDER — STROKE: EARLY STAGES OF RECOVERY BOOK
Freq: Once | Status: AC
  Administered 2015-06-01: 10:00:00
  Filled 2015-05-31: qty 1

## 2015-05-31 MED ORDER — FLUTICASONE PROPIONATE 50 MCG/ACT NA SUSP
1.0000 | Freq: Every day | NASAL | Status: DC
  Administered 2015-06-01 – 2015-06-03 (×3): 1 via NASAL
  Filled 2015-05-31: qty 16

## 2015-05-31 MED ORDER — SODIUM CHLORIDE 0.9 % IV SOLN
INTRAVENOUS | Status: DC
  Administered 2015-06-01 – 2015-06-03 (×3): via INTRAVENOUS
  Administered 2015-06-04: 1000 mL via INTRAVENOUS
  Administered 2015-06-04: 09:00:00 via INTRAVENOUS

## 2015-05-31 MED ORDER — ASPIRIN 325 MG PO TABS
325.0000 mg | ORAL_TABLET | Freq: Every day | ORAL | Status: DC
  Administered 2015-06-01 – 2015-06-10 (×10): 325 mg via ORAL
  Filled 2015-05-31 (×10): qty 1

## 2015-05-31 MED ORDER — ASPIRIN 300 MG RE SUPP
300.0000 mg | Freq: Every day | RECTAL | Status: DC
  Filled 2015-05-31 (×13): qty 1

## 2015-05-31 MED ORDER — TRAZODONE HCL 50 MG PO TABS
50.0000 mg | ORAL_TABLET | Freq: Every day | ORAL | Status: DC
  Administered 2015-06-01 – 2015-06-09 (×8): 50 mg via ORAL
  Filled 2015-05-31 (×9): qty 1

## 2015-05-31 MED ORDER — SENNOSIDES-DOCUSATE SODIUM 8.6-50 MG PO TABS: 1.0000 | ORAL_TABLET | Freq: Every evening | ORAL | Status: DC | PRN

## 2015-05-31 MED ORDER — HEPARIN SODIUM (PORCINE) 5000 UNIT/ML IJ SOLN
5000.0000 [IU] | Freq: Three times a day (TID) | INTRAMUSCULAR | Status: DC
  Administered 2015-06-01 – 2015-06-10 (×29): 5000 [IU] via SUBCUTANEOUS
  Filled 2015-05-31 (×30): qty 1

## 2015-05-31 MED ORDER — PNEUMOCOCCAL VAC POLYVALENT 25 MCG/0.5ML IJ INJ
0.5000 mL | INJECTION | INTRAMUSCULAR | Status: AC
  Administered 2015-06-01: 0.5 mL via INTRAMUSCULAR
  Filled 2015-05-31 (×2): qty 0.5

## 2015-05-31 MED ORDER — AZITHROMYCIN 250 MG PO TABS
500.0000 mg | ORAL_TABLET | Freq: Every day | ORAL | Status: AC
  Administered 2015-06-01: 500 mg via ORAL
  Filled 2015-05-31: qty 2

## 2015-05-31 MED ORDER — PRAVASTATIN SODIUM 40 MG PO TABS
40.0000 mg | ORAL_TABLET | Freq: Every evening | ORAL | Status: DC
  Administered 2015-06-01 – 2015-06-09 (×10): 40 mg via ORAL
  Filled 2015-05-31 (×11): qty 1

## 2015-05-31 MED ORDER — ALBUTEROL SULFATE (2.5 MG/3ML) 0.083% IN NEBU
2.5000 mg | INHALATION_SOLUTION | RESPIRATORY_TRACT | Status: DC | PRN
  Administered 2015-06-01 – 2015-06-08 (×9): 2.5 mg via RESPIRATORY_TRACT
  Filled 2015-05-31 (×11): qty 3

## 2015-05-31 MED ORDER — DM-GUAIFENESIN ER 30-600 MG PO TB12
1.0000 | ORAL_TABLET | Freq: Two times a day (BID) | ORAL | Status: DC
  Administered 2015-06-01 – 2015-06-10 (×19): 1 via ORAL
  Filled 2015-05-31 (×20): qty 1

## 2015-05-31 MED ORDER — VARENICLINE TARTRATE 1 MG PO TABS
1.0000 mg | ORAL_TABLET | Freq: Two times a day (BID) | ORAL | Status: DC
  Administered 2015-06-02 (×2): 1 mg via ORAL
  Filled 2015-05-31 (×25): qty 1

## 2015-05-31 NOTE — ED Notes (Signed)
Patient ambulated to restroom with assist, tolerated well 

## 2015-05-31 NOTE — H&P (Addendum)
[facility] History and Physical  Tara Park ZOX:[id number] DOB: 11-29-1945 DOA: 05/31/2015  Referring physician: ED physician PCP: Isabella Stalling, MD  Specialists:   Chief Complaint: Left-sided weakness  HPI: Tara Park is a 69 y.o. female with PMH of COPD, hypertension, hyperlipidemia, diabetes mellitus, tobacco abuse, COPD, who presented with left-sided weakness.  Patient reports that she started having sudden onset left-sided weakness when she was sitting in the chair at about 18:06. She did not have vision change or hearing loss. Denies tingling sensations. The symptoms lasted for about 15-20 minutes, then resolved spontaneously.  Patient reports that she has cough with yellow colored sputum production. She also has shortness of breath, which is close to her baseline. No chest pain, fever or chills. Patient does not have abdominal pain, diarrhea, symptoms of UTI, rashes, leg edema.  In ED, patient was found to have INR 1.11, PTT 27, troponin negative, positive UDS for THC, negative urinalysis, WBC 7.5, temperature normal, slightly tachycardic, potassium 4.4, renal function okay. MRI of the brain showed 5 mm acute ischemic nonhemorrhagic infarct within the left centrum semi ovale. No significant mass effect; remote lacunar infarcts involving the left lentiform nucleus and right thalamus; age-related cerebral atrophy with moderate chronic microvascular ischemic disease. Patient is admitted to inpatient for further evaluation and treatment.  Where does patient live?   At home    Can patient participate in ADLs?  Some   Review of Systems:   General: no fevers, chills, no changes in body weight, has fatigue HEENT: no blurry vision, hearing changes or sore throat Pulm: has dyspnea, coughing, wheezing CV: no chest pain, palpitations Abd: no nausea, vomiting, abdominal pain, diarrhea, constipation GU: no dysuria, burning on urination, increased urinary frequency, hematuria   Ext: no leg edema Neuro: has right-sided unilateral weakness, no vision change or hearing loss Skin: no rash MSK: No muscle spasm, no deformity, no limitation of range of movement in spin Heme: No easy bruising.  Travel history: No recent long distant travel.  Allergy: No Known Allergies  Past Medical History  Diagnosis Date  . Diabetes mellitus without complication (HCC)   . Hypertension   . Tobacco abuse   . Use of cane as ambulatory aid   . Leg edema, right     chronic    Past Surgical History  Procedure Laterality Date  . Cataract extraction    . Hip surgery    . Foot surgery      Social History:  reports that she has been smoking Cigarettes.  She does not have any smokeless tobacco history on file. She reports that she does not drink alcohol or use illicit drugs.  Family History:  Family History  Problem Relation Age of Onset  . Transient ischemic attack Mother   . COPD Father   . Heart disease Brother      Prior to Admission medications   Medication Sig Start Date End Date Taking? Authorizing Provider  albuterol (PROAIR HFA) 108 (90 BASE) MCG/ACT inhaler Inhale 2 puffs into the lungs every 6 (six) hours as needed for wheezing or shortness of breath.   Yes Historical Provider, MD  albuterol (PROVENTIL) (2.5 MG/3ML) 0.083% nebulizer solution Take 2.5 mg by nebulization every 6 (six) hours as needed for wheezing or shortness of breath.   Yes Historical Provider, MD  chlorpheniramine (CHLOR-TRIMETON) 4 MG tablet Take 4 mg by mouth every 4 (four) hours.   Yes Historical Provider, MD  COMBIVENT RESPIMAT 20-100 MCG/ACT AERS respimat Inhale 2  puffs into the lungs 4 (four) times daily.  05/23/15  Yes Historical Provider, MD  HYDROcodone-acetaminophen (NORCO) 7.5-325 MG tablet Take 1 tablet by mouth every 6 (six) hours as needed for moderate pain or severe pain.  05/04/15  Yes Historical Provider, MD  lisinopril (PRINIVIL,ZESTRIL) 10 MG tablet Take 10 mg by mouth every  morning.   Yes Historical Provider, MD  metFORMIN (GLUCOPHAGE) 500 MG tablet Take 500 mg by mouth 2 (two) times daily with a meal.   Yes Historical Provider, MD  mometasone (NASONEX) 50 MCG/ACT nasal spray Place 1-2 sprays into the nose daily as needed (for allergies).    Yes Historical Provider, MD  naproxen (NAPROSYN) 500 MG tablet Take 500 mg by mouth 2 (two) times daily. 04/27/15  Yes Historical Provider, MD  phenylephrine (SUDAFED PE) 10 MG TABS tablet Take 10 mg by mouth every 4 (four) hours.   Yes Historical Provider, MD  pravastatin (PRAVACHOL) 40 MG tablet Take 40 mg by mouth every evening.   Yes Historical Provider, MD  traZODone (DESYREL) 50 MG tablet Take 50 mg by mouth at bedtime. 05/09/15  Yes Historical Provider, MD  CHANTIX CONTINUING MONTH PAK 1 MG tablet Take 1 tablet by mouth 2 (two) times daily. 05/06/15   Historical Provider, MD    Physical Exam: Filed Vitals:   05/31/15 2352 06/01/15 0143 06/01/15 0343 06/01/15 0543  BP:  139/82 134/79 130/89  Pulse:  98 90 104  Temp:  97.7 F (36.5 C) 98 F (36.7 C) 98.4 F (36.9 C)  TempSrc:  Oral Oral Oral  Resp:  20 20 20   Height:      Weight:      SpO2: 99% 99% 99% 97%   General: Not in acute distress HEENT:       Eyes: PERRL, EOMI, no scleral icterus.       ENT: No discharge from the ears and nose, no pharynx injection, no tonsillar enlargement.        Neck: No JVD, no bruit, no mass felt. Heme: No neck lymph node enlargement. Cardiac: S1/S2, RRR, No murmurs, No gallops or rubs. Pulm: Has bilateral wheezing, No rales or rubs. Abd: Soft, nondistended, nontender, no rebound pain, no organomegaly, BS present. Ext: 1+ pitting leg edema bilaterally. As venous insufficient change. 2+DP/PT pulse bilaterally. Musculoskeletal: No joint deformities, No joint redness or warmth, no limitation of ROM in spin. Skin: No rashes.  Neuro: Alert, oriented X3, cranial nerves II-XII grossly intact, muscle strength 5/5 in all extremities,  sensation to light touch intact. Brachial reflex 2+ bilaterally. Knee reflex 1+ bilaterally. Negative Babinski's sign. Normal finger to nose test. Psych: Patient is not psychotic, no suicidal or hemocidal ideation.  Labs on Admission:  Basic Metabolic Panel:  Recent Labs Lab 05/31/15 2026 05/31/15 2045  NA 139 140  K 7.2* 4.4  CL 107 108  CO2  --  26  GLUCOSE 95 101*  BUN 30* 20  CREATININE 0.70 0.77  CALCIUM  --  8.8*   Liver Function Tests:  Recent Labs Lab 05/31/15 2045  AST 24  ALT 23  ALKPHOS 126  BILITOT 0.4  PROT 6.9  ALBUMIN 3.6   No results for input(s): LIPASE, AMYLASE in the last 168 hours. No results for input(s): AMMONIA in the last 168 hours. CBC:  Recent Labs Lab 05/31/15 2020 05/31/15 2026  WBC 7.4  --   NEUTROABS 5.0  --   HGB 16.1* 17.3*  HCT 46.2* 51.0*  MCV 79.8  --  PLT 201  --    Cardiac Enzymes: No results for input(s): CKTOTAL, CKMB, CKMBINDEX, TROPONINI in the last 168 hours.  BNP (last 3 results) No results for input(s): BNP in the last 8760 hours.  ProBNP (last 3 results) No results for input(s): PROBNP in the last 8760 hours.  CBG:  Recent Labs Lab 06/01/15 0039  GLUCAP 105*    Radiological Exams on Admission: Dg Chest 2 View  05/31/2015  CLINICAL DATA:  Sudden onset of left-sided weakness, shortness of breath and cough tonight. EXAM: CHEST  2 VIEW COMPARISON:  05/12/2013. FINDINGS: The cardiac silhouette, mediastinal and hilar contours are within normal limits and stable. There is tortuosity of the thoracic aorta. There are chronic emphysematous and bronchitic lung changes but no acute overlying pulmonary process. No definite pleural effusions. The bony thorax appears stable. Numerous remote thoracic and lumbar compression deformities and osteoporosis. IMPRESSION: Chronic emphysematous and bronchitic lung changes but no acute overlying pulmonary process. Electronically Signed   By: Rudie Meyer M.D.   On: 05/31/2015 23:04    Mr Brain Wo Contrast (neuro Protocol)  05/31/2015  CLINICAL DATA:  Initial evaluation for acute onset left-sided weakness. EXAM: MRI HEAD WITHOUT CONTRAST TECHNIQUE: Multiplanar, multiecho pulse sequences of the brain and surrounding structures were obtained without intravenous contrast. COMPARISON:  None. FINDINGS: There is a in 5 mm focus of restricted diffusion involving the deep white matter of the left centrum semi ovale and the mid to posterior left frontal lobe (series 100, image 38), consistent with a small acute ischemic infarct. No associated edema or hemorrhage. No other acute ischemic infarct. Normal intracranial vascular flow voids are maintained. No acute intracranial hemorrhage. Mild age-related cerebral atrophy is present. Patchy and confluent T2/FLAIR hyperintensity within the periventricular and deep white matter both cerebral hemispheres most likely related to chronic small vessel ischemic disease, moderate nature. Similar changes seen within the pons. Remote hemorrhagic lacunar type infarct within the left lentiform nucleus. Additional remote lacunar infarct within the right thalamus. No mass lesion, midline shift, or mass effect. No hydrocephalus. No extra-axial fluid collection. Craniocervical junction within normal limits. Pituitary gland normal. No acute abnormality about the orbits. Sequela prior bilateral lens extraction noted. Minimal layering fluid within the maxillary sinuses bilaterally. Paranasal sinuses and mastoid air cells are otherwise clear. Inner ear structures grossly normal. Bone marrow signal intensity within normal limits. No scalp soft tissue abnormality. IMPRESSION: 1. 5 mm acute ischemic nonhemorrhagic infarct within the left centrum semi ovale. No significant mass effect. 2. Remote lacunar infarcts involving the left lentiform nucleus and right thalamus. 3. Age-related cerebral atrophy with moderate chronic microvascular ischemic disease. Electronically Signed   By:  Rise Mu M.D.   On: 05/31/2015 20:20    EKG: Independently reviewed.  QTC 438, no ischemic change  Assessment/Plan Principal Problem:   Acute ischemic stroke [facility]) Active Problems:   Tobacco abuse   Diabetes mellitus without complication (HCC)   Hypertension   COPD exacerbation (HCC)  Acute ischemic stroke [facility]): as showed by MRI. EDP discussed with the neurologist, Dr. Roseanne Reno, who recommend keep patient in [facility] and be seen by [facility] Neuro MD in the morning.   - will admit to tele bed - May need to call [facility] neuro in AM - Risk factor modification: HgbA1c, fasting lipid panel  - MRA of the brain without contrast  - PT consult, OT consult, Speech consult  - 2 d Echocardiogram  - Ekg  - Carotid dopplers. 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 Patient is taking med forming at home -SSI -Check A1c  Hypertension:  -Hold lisinopril in the setting of acute stroke  Tobacco abuse: -Did counseling about importance of quitting smoking -Chantix  COPD exacerbation (HCC): Patient has some mild COPD exacerbation with wheezing and a productive cough. -CXR -Nebulizers: scheduled Duoneb and prn albuterol -Solu-Medrol 60 mg IV daily  -Oral azithromycin for 5 days.  -Mucinex for cough  -Urine S. pneumococcal antigen -Follow up blood culture x2, sputum culture, Flu pcr  HLD: Last LDL was not on record -Continue home medications: Pravastatin -Check FLP   DVT ppx: SQ Heparin    Code Status: Full code Family Communication:   Yes, patient's son and daughter-in-law   at bed side Disposition Plan: Admit to inpatient   Date of Service 06/01/2015    Lorretta Harp [facility] Pager [phone]  If 7PM-7AM, please contact night-coverage [email or web address] Password [email or web address] 06/01/2015, 6:03 AM

## 2015-05-31 NOTE — ED Notes (Signed)
Pt c/o sudden onset of left side weakness that started around 1806 and resolved by 1820.

## 2015-05-31 NOTE — ED Provider Notes (Signed)
CSN: [id number]     Arrival date & time 05/31/15  1907 History   First MD Initiated Contact with Patient 05/31/15 1915     Chief Complaint  Patient presents with  . Weakness      HPI  Pt was seen at 1920.  Per pt and her family, c/o sudden onset and resolution of one episode of left sided "weakness" that occurred approximately 1805 PTA. Pt states he was sitting in a chair watching TV when her symptoms began. Pt's symptoms included: slurred speech, LUE and LLE "heaviness," with "decreased movement" as well as inability to walk. Pt states her symptoms lasted until 1820, when they fully resolved. Pt's family states pt was then able to walk and speak per her baseline. Pt currently denies any complaints. Denies CP/palpitations, no SOB/cough, no abd pain, no N/V/D, no facial droop, no visual changes.    Past Medical History  Diagnosis Date  . Diabetes mellitus without complication (HCC)   . Hypertension   . Tobacco abuse   . Use of cane as ambulatory aid   . Leg edema, right     chronic   Past Surgical History  Procedure Laterality Date  . Cataract extraction    . Hip surgery    . Foot surgery      Social History  Substance Use Topics  . Smoking status: Current Every Day Smoker    Types: Cigarettes  . Smokeless tobacco: None  . Alcohol Use: No    Review of Systems ROS: Statement: All systems negative except as marked or noted in the HPI; Constitutional: Negative for fever and chills. ; ; Eyes: Negative for eye pain, redness and discharge. ; ; ENMT: Negative for ear pain, hoarseness, nasal congestion, sinus pressure and sore throat. ; ; Cardiovascular: Negative for chest pain, palpitations, diaphoresis, dyspnea and peripheral edema. ; ; Respiratory: Negative for cough, wheezing and stridor. ; ; Gastrointestinal: Negative for nausea, vomiting, diarrhea, abdominal pain, blood in stool, hematemesis, jaundice and rectal bleeding. . ; ; Genitourinary: Negative for dysuria, flank pain and  hematuria. ; ; Musculoskeletal: Negative for back pain and neck pain. Negative for new swelling and trauma.; ; Skin: Negative for pruritus, rash, abrasions, blisters, bruising and skin lesion.; ; Neuro: +slurred speech, left sided weakness, left sided paresthesias.  Negative for headache, lightheadedness and neck stiffness. Negative for weakness, altered level of consciousness , altered mental status, involuntary movement, seizure and syncope.      Allergies  Review of patient's allergies indicates no known allergies.  Home Medications   Prior to Admission medications   Medication Sig Start Date End Date Taking? Authorizing Provider  albuterol (PROAIR HFA) 108 (90 BASE) MCG/ACT inhaler Inhale 2 puffs into the lungs every 6 (six) hours as needed for wheezing or shortness of breath.   Yes Historical Provider, MD  albuterol (PROVENTIL) (2.5 MG/3ML) 0.083% nebulizer solution Take 2.5 mg by nebulization every 6 (six) hours as needed for wheezing or shortness of breath.   Yes Historical Provider, MD  chlorpheniramine (CHLOR-TRIMETON) 4 MG tablet Take 4 mg by mouth every 4 (four) hours.   Yes Historical Provider, MD  COMBIVENT RESPIMAT 20-100 MCG/ACT AERS respimat Inhale 2 puffs into the lungs 4 (four) times daily.  05/23/15  Yes Historical Provider, MD  HYDROcodone-acetaminophen (NORCO) 7.5-325 MG tablet Take 1 tablet by mouth every 6 (six) hours as needed for moderate pain or severe pain.  05/04/15  Yes Historical Provider, MD  lisinopril (PRINIVIL,ZESTRIL) 10 MG tablet Take 10 mg  by mouth every morning.   Yes Historical Provider, MD  metFORMIN (GLUCOPHAGE) 500 MG tablet Take 500 mg by mouth 2 (two) times daily with a meal.   Yes Historical Provider, MD  mometasone (NASONEX) 50 MCG/ACT nasal spray Place 1-2 sprays into the nose daily as needed (for allergies).    Yes Historical Provider, MD  naproxen (NAPROSYN) 500 MG tablet Take 500 mg by mouth 2 (two) times daily. 04/27/15  Yes Historical Provider, MD   phenylephrine (SUDAFED PE) 10 MG TABS tablet Take 10 mg by mouth every 4 (four) hours.   Yes Historical Provider, MD  pravastatin (PRAVACHOL) 40 MG tablet Take 40 mg by mouth every evening.   Yes Historical Provider, MD  traZODone (DESYREL) 50 MG tablet Take 50 mg by mouth at bedtime. 05/09/15  Yes Historical Provider, MD  CHANTIX CONTINUING MONTH PAK 1 MG tablet Take 1 tablet by mouth 2 (two) times daily. 05/06/15   Historical Provider, MD   BP 162/102 mmHg  Pulse 98  Temp(Src) 97.4 F (36.3 C) (Oral)  Resp 22  Ht 5\' 5"  (1.651 m)  Wt 140 lb (63.504 kg)  BMI 23.30 kg/m2  SpO2 97% Physical Exam  1925: Physical examination:  Nursing notes reviewed; Vital signs and O2 SAT reviewed;  Constitutional: Well developed, Well nourished, Well hydrated, In no acute distress; Head:  Normocephalic, atraumatic; Eyes: EOMI, PERRL, No scleral icterus; ENMT: Mouth and pharynx normal, Mucous membranes moist; Neck: Supple, Full range of motion, No lymphadenopathy; Cardiovascular: Regular rate and rhythm, No gallop; Respiratory: Breath sounds clear & equal bilaterally, No wheezes.  Speaking full sentences with ease, Normal respiratory effort/excursion; Chest: Nontender, Movement normal; Abdomen: Soft, Nontender, Nondistended, Normal bowel sounds; Genitourinary: No CVA tenderness; Extremities: Pulses normal, No tenderness, +1 RLE edema per baseline.;; Neuro: AA&Ox3, Major CN grossly intact. Speech clear.  No facial droop.  No nystagmus. Grips equal. Strength 5/5 equal bilat UE's and LE's.  DTR 2/4 equal bilat UE's and LE's.  No gross sensory deficits.  Normal cerebellar testing bilat UE's (finger-nose) and LE's (heel-shin).; Skin: Color normal, Warm, Dry.   ED Course  Procedures (including critical care time) Labs Review   Imaging Review  I have personally reviewed and evaluated these images and lab results as part of my medical decision-making.   EKG Interpretation   Date/Time:  Tuesday May 31 2015  19:26:39 EST Ventricular Rate:  90 PR Interval:  137 QRS Duration: 85 QT Interval:  358 QTC Calculation: 438 R Axis:   85 Text Interpretation:  Sinus rhythm Borderline right axis deviation  Baseline wander Artifact When compared with ECG of 10/06/2006 No  significant change was found Confirmed by [facility]  MD, Nicholos JohnsKATHLEEN [phone]([id number]) on  05/31/2015 7:35:22 PM      MDM  MDM Reviewed: previous chart, nursing note and vitals Reviewed previous: labs and ECG Interpretation: labs, ECG and MRI     Results for orders placed or performed during the hospital encounter of 05/31/15  Ethanol  Result Value Ref Range   Alcohol, Ethyl (B) <5 <5 mg/dL  Protime-INR  Result Value Ref Range   Prothrombin Time 14.5 11.6 - 15.2 seconds   INR 1.11 0.00 - 1.49  APTT  Result Value Ref Range   aPTT 27 24 - 37 seconds  CBC  Result Value Ref Range   WBC 7.4 4.0 - 10.5 K/uL   RBC 5.79 (H) 3.87 - 5.11 MIL/uL   Hemoglobin 16.1 (H) 12.0 - 15.0 g/dL   HCT 13.046.2 (H) 86.536.0 -  46.0 %   MCV 79.8 78.0 - 100.0 fL   MCH 27.8 26.0 - 34.0 pg   MCHC 34.8 30.0 - 36.0 g/dL   RDW 16.1 09.6 - 04.5 %   Platelets 201 150 - 400 K/uL  Differential  Result Value Ref Range   Neutrophils Relative % 67 %   Neutro Abs 5.0 1.7 - 7.7 K/uL   Lymphocytes Relative 20 %   Lymphs Abs 1.5 0.7 - 4.0 K/uL   Monocytes Relative 8 %   Monocytes Absolute 0.6 0.1 - 1.0 K/uL   Eosinophils Relative 4 %   Eosinophils Absolute 0.3 0.0 - 0.7 K/uL   Basophils Relative 1 %   Basophils Absolute 0.1 0.0 - 0.1 K/uL  Comprehensive metabolic panel  Result Value Ref Range   Sodium 140 135 - 145 mmol/L   Potassium 4.4 3.5 - 5.1 mmol/L   Chloride 108 101 - 111 mmol/L   CO2 26 22 - 32 mmol/L   Glucose, Bld 101 (H) 65 - 99 mg/dL   BUN 20 6 - 20 mg/dL   Creatinine, Ser 4.09 0.44 - 1.00 mg/dL   Calcium 8.8 (L) 8.9 - 10.3 mg/dL   Total Protein 6.9 6.5 - 8.1 g/dL   Albumin 3.6 3.5 - 5.0 g/dL   AST 24 15 - 41 U/L   ALT 23 14 - 54 U/L   Alkaline  Phosphatase 126 38 - 126 U/L   Total Bilirubin 0.4 0.3 - 1.2 mg/dL   GFR calc non Af Amer >60 >60 mL/min   GFR calc Af Amer >60 >60 mL/min   Anion gap 6 5 - 15  Urine rapid drug screen (hosp performed)not at [facility]  Result Value Ref Range   Opiates NONE DETECTED NONE DETECTED   Cocaine NONE DETECTED NONE DETECTED   Benzodiazepines NONE DETECTED NONE DETECTED   Amphetamines NONE DETECTED NONE DETECTED   Tetrahydrocannabinol POSITIVE (A) NONE DETECTED   Barbiturates NONE DETECTED NONE DETECTED  Urinalysis, Routine w reflex microscopic (not at [facility])  Result Value Ref Range   Color, Urine YELLOW YELLOW   APPearance CLEAR CLEAR   Specific Gravity, Urine 1.010 1.005 - 1.030   pH 6.5 5.0 - 8.0   Glucose, UA NEGATIVE NEGATIVE mg/dL   Hgb urine dipstick NEGATIVE NEGATIVE   Bilirubin Urine NEGATIVE NEGATIVE   Ketones, ur NEGATIVE NEGATIVE mg/dL   Protein, ur NEGATIVE NEGATIVE mg/dL   Urobilinogen, UA 0.2 0.0 - 1.0 mg/dL   Nitrite NEGATIVE NEGATIVE   Leukocytes, UA NEGATIVE NEGATIVE  I-Stat Chem 8, ED  (not at [facility])  Result Value Ref Range   Sodium 139 135 - 145 mmol/L   Potassium 7.2 (HH) 3.5 - 5.1 mmol/L   Chloride 107 101 - 111 mmol/L   BUN 30 (H) 6 - 20 mg/dL   Creatinine, Ser 8.11 0.44 - 1.00 mg/dL   Glucose, Bld 95 65 - 99 mg/dL   Calcium, Ion 9.14 (L) 1.13 - 1.30 mmol/L   TCO2 25 0 - 100 mmol/L   Hemoglobin 17.3 (H) 12.0 - 15.0 g/dL   HCT 78.2 (H) 95.6 - 21.3 %   Comment NOTIFIED PHYSICIAN   I-stat troponin, ED (not at [facility], [facility])  Result Value Ref Range   Troponin i, poc 0.01 0.00 - 0.08 ng/mL   Comment 3           Mr Brain Wo Contrast (neuro Protocol) 05/31/2015  CLINICAL DATA:  Initial evaluation for acute onset left-sided weakness. EXAM: MRI HEAD WITHOUT  CONTRAST TECHNIQUE: Multiplanar, multiecho pulse sequences of the brain and surrounding structures were obtained without intravenous contrast. COMPARISON:  None. FINDINGS: There is a in 5 mm focus of restricted  diffusion involving the deep white matter of the left centrum semi ovale and the mid to posterior left frontal lobe (series 100, image 38), consistent with a small acute ischemic infarct. No associated edema or hemorrhage. No other acute ischemic infarct. Normal intracranial vascular flow voids are maintained. No acute intracranial hemorrhage. Mild age-related cerebral atrophy is present. Patchy and confluent T2/FLAIR hyperintensity within the periventricular and deep white matter both cerebral hemispheres most likely related to chronic small vessel ischemic disease, moderate nature. Similar changes seen within the pons. Remote hemorrhagic lacunar type infarct within the left lentiform nucleus. Additional remote lacunar infarct within the right thalamus. No mass lesion, midline shift, or mass effect. No hydrocephalus. No extra-axial fluid collection. Craniocervical junction within normal limits. Pituitary gland normal. No acute abnormality about the orbits. Sequela prior bilateral lens extraction noted. Minimal layering fluid within the maxillary sinuses bilaterally. Paranasal sinuses and mastoid air cells are otherwise clear. Inner ear structures grossly normal. Bone marrow signal intensity within normal limits. No scalp soft tissue abnormality. IMPRESSION: 1. 5 mm acute ischemic nonhemorrhagic infarct within the left centrum semi ovale. No significant mass effect. 2. Remote lacunar infarcts involving the left lentiform nucleus and right thalamus. 3. Age-related cerebral atrophy with moderate chronic microvascular ischemic disease. Electronically Signed   By: Rise Mu M.D.   On: 05/31/2015 20:20    2155:  Pt not code stroke on arrival due to resolved symptoms; no indication for intervention at this time. Pt has been ambulatory with her cane per baseline gait. Pt continues to deny any complaints since arrival to the ED. Neuro exam remains intact and unchanged. Dx and testing d/w pt and family.   Questions answered.  Verb understanding, agreeable to admit. T/C to [facility] Dr. Clyde Lundborg, case discussed, including:  HPI, pertinent PM/SHx, VS/PE, dx testing, ED course and treatment:  Agreeable to admit, requests to call [facility] Neuro to see if they want him transferred to [facility]. T/C to [facility] Neuro Dr. Roseanne Reno , case discussed, including:  HPI, pertinent PM/SHx, VS/PE, dx testing, ED course and treatment:  Agrees there is no indication for intervention at this time due to pt's resolved symptoms, pt can stay at [facility] and be seen by [facility] Neuro MD in the morning. [facility] MD updated.    Samuel Jester, DO 06/02/15 2353

## 2015-05-31 NOTE — ED Notes (Signed)
Pt presently in xray-

## 2015-06-01 ENCOUNTER — Inpatient Hospital Stay (HOSPITAL_COMMUNITY): Payer: Medicare Other

## 2015-06-01 ENCOUNTER — Encounter (HOSPITAL_COMMUNITY): Payer: Self-pay | Admitting: Internal Medicine

## 2015-06-01 DIAGNOSIS — I639 Cerebral infarction, unspecified: Secondary | ICD-10-CM | POA: Diagnosis not present

## 2015-06-01 LAB — GLUCOSE, CAPILLARY
GLUCOSE-CAPILLARY: 130 mg/dL — AB (ref 65–99)
GLUCOSE-CAPILLARY: 146 mg/dL — AB (ref 65–99)
Glucose-Capillary: 105 mg/dL — ABNORMAL HIGH (ref 65–99)
Glucose-Capillary: 140 mg/dL — ABNORMAL HIGH (ref 65–99)
Glucose-Capillary: 202 mg/dL — ABNORMAL HIGH (ref 65–99)

## 2015-06-01 LAB — LIPID PANEL
CHOL/HDL RATIO: 5.3 ratio
Cholesterol: 155 mg/dL (ref 0–200)
HDL: 29 mg/dL — AB (ref >40)
LDL CALC: 113 mg/dL — AB (ref 0–99)
Triglycerides: 67 mg/dL (ref <150)
VLDL: 13 mg/dL (ref 0–40)

## 2015-06-01 LAB — BRAIN NATRIURETIC PEPTIDE: B Natriuretic Peptide: 203 pg/mL — ABNORMAL HIGH (ref 0.0–100.0)

## 2015-06-01 LAB — INFLUENZA PANEL BY PCR (TYPE A & B)
H1N1FLUPCR: NOT DETECTED
INFLBPCR: NEGATIVE
Influenza A By PCR: NEGATIVE

## 2015-06-01 LAB — EXPECTORATED SPUTUM ASSESSMENT W REFEX TO RESP CULTURE

## 2015-06-01 LAB — EXPECTORATED SPUTUM ASSESSMENT W GRAM STAIN, RFLX TO RESP C

## 2015-06-01 LAB — STREP PNEUMONIAE URINARY ANTIGEN: Strep Pneumo Urinary Antigen: NEGATIVE

## 2015-06-01 MED ORDER — IPRATROPIUM-ALBUTEROL 0.5-2.5 (3) MG/3ML IN SOLN
3.0000 mL | Freq: Four times a day (QID) | RESPIRATORY_TRACT | Status: DC
  Administered 2015-06-01 – 2015-06-02 (×6): 3 mL via RESPIRATORY_TRACT
  Filled 2015-06-01 (×6): qty 3

## 2015-06-01 NOTE — Progress Notes (Signed)
Patient admitted with left hemiparesis which was transient persisting for 10-15 minutes which has resolved completely spontaneously shortly after admission patient is alert and oriented imaging reveals a 5 mm nonhemorrhagic infarct of left semi-centrum semiovale carotids revealed less than 50% bilateral stenosis echo is pending at this time. Patient currently on full dose aspirin Tara Park ZOX:[id number] DOB: 1945/11/22 DOA: 05/31/2015 PCP: Isabella Stalling, MD             Physical Exam: Blood pressure 131/74, pulse 110, temperature 98.3 F (36.8 C), temperature source Oral, resp. rate 20, height  (1.626 m), weight 139 lb 1.8 oz (63.1 kg), SpO2 98 %. alert and oriented 3 cranial nerve XII grossly intact sensorimotor 5 over 5 all extremities plantar responses downgoing bilaterally no tremor visible neck she has no carotid bruits no thyromegaly no thyroid bruits lungs show prolonged history phase scattered rhonchi no rales no wheezes appreciable heart regular rhythm no S3 or S4 no heaves thrills rubs abdomen soft nontender bowel sounds normoactive guarding or rebound masses no megaly extremities trace to 1+ pedal edema bilaterally   Investigations:  No results found for this or any previous visit (from the past 240 hour(s)).   Basic Metabolic Panel:  Recent Labs  81/19/14 2026 05/31/15 2045  NA 139 140  K 7.2* 4.4  CL 107 108  CO2  --  26  GLUCOSE 95 101*  BUN 30* 20  CREATININE 0.70 0.77  CALCIUM  --  8.8*   Liver Function Tests:  Recent Labs  05/31/15 2045  AST 24  ALT 23  ALKPHOS 126  BILITOT 0.4  PROT 6.9  ALBUMIN 3.6     CBC:  Recent Labs  05/31/15 2020 05/31/15 2026  WBC 7.4  --   NEUTROABS 5.0  --   HGB 16.1* 17.3*  HCT 46.2* 51.0*  MCV 79.8  --   PLT 201  --     Dg Chest 2 View  05/31/2015  CLINICAL DATA:  Sudden onset of left-sided weakness, shortness of breath and cough tonight. EXAM: CHEST  2 VIEW COMPARISON:  05/12/2013.  FINDINGS: The cardiac silhouette, mediastinal and hilar contours are within normal limits and stable. There is tortuosity of the thoracic aorta. There are chronic emphysematous and bronchitic lung changes but no acute overlying pulmonary process. No definite pleural effusions. The bony thorax appears stable. Numerous remote thoracic and lumbar compression deformities and osteoporosis. IMPRESSION: Chronic emphysematous and bronchitic lung changes but no acute overlying pulmonary process. Electronically Signed   By: Rudie Meyer M.D.   On: 05/31/2015 23:04   Mr Maxine Glenn Head Wo Contrast  06/01/2015  CLINICAL DATA:  Left-sided weakness. Acute infarct in the left centrum semiovale on MRI yesterday. EXAM: MRA HEAD WITHOUT CONTRAST TECHNIQUE: Angiographic images of the Circle of Willis were obtained using MRA technique without intravenous contrast. COMPARISON:  None. FINDINGS: The visualized distal right vertebral artery is patent and dominant without stenosis. The distal left vertebral artery appears hypoplastic. PICA origins are patent. Right AICA and bilateral SCA origins are patent. Basilar artery is patent and slightly small in caliber developmentally without superimposed stenosis. There are prominent posterior communicating arteries bilaterally. The right P1 segment is hypoplastic, and the left P1 segment may be absent. The proximal P2 segments are widely patent. The more distal PCAs are not well evaluated due to diminished flow related signal, and underlying mid to distal P2 stenoses are not excluded. The internal carotid arteries are patent from skullbase to carotid termini. There is  bilateral carotid siphon atherosclerotic irregularity with minimal proximal right cavernous stenosis and mild proximal left supraclinoid ICA stenosis. The M1 segments are patent without stenosis. MCA bifurcations are patent without evidence of major branch vessel occlusion, however there is mild-to-moderate MCA branch vessel  irregularity and attenuation bilaterally. Right A1 segment is widely patent. There is a mild proximal left A1 stenosis. No intracranial aneurysm is identified. IMPRESSION: 1. No evidence of medium or large vessel intracranial occlusion or flow-limiting proximal stenosis. 2. Mild left supraclinoid ICA and proximal left ACA stenoses. 3. Anterior and posterior circulation branch vessel irregularity and attenuation suggestive of atherosclerosis. Electronically Signed   By: Sebastian AcheAllen  Grady M.D.   On: 06/01/2015 09:02   Mr Brain Wo Contrast (neuro Protocol)  05/31/2015  CLINICAL DATA:  Initial evaluation for acute onset left-sided weakness. EXAM: MRI HEAD WITHOUT CONTRAST TECHNIQUE: Multiplanar, multiecho pulse sequences of the brain and surrounding structures were obtained without intravenous contrast. COMPARISON:  None. FINDINGS: There is a in 5 mm focus of restricted diffusion involving the deep white matter of the left centrum semi ovale and the mid to posterior left frontal lobe (series 100, image 38), consistent with a small acute ischemic infarct. No associated edema or hemorrhage. No other acute ischemic infarct. Normal intracranial vascular flow voids are maintained. No acute intracranial hemorrhage. Mild age-related cerebral atrophy is present. Patchy and confluent T2/FLAIR hyperintensity within the periventricular and deep white matter both cerebral hemispheres most likely related to chronic small vessel ischemic disease, moderate nature. Similar changes seen within the pons. Remote hemorrhagic lacunar type infarct within the left lentiform nucleus. Additional remote lacunar infarct within the right thalamus. No mass lesion, midline shift, or mass effect. No hydrocephalus. No extra-axial fluid collection. Craniocervical junction within normal limits. Pituitary gland normal. No acute abnormality about the orbits. Sequela prior bilateral lens extraction noted. Minimal layering fluid within the maxillary sinuses  bilaterally. Paranasal sinuses and mastoid air cells are otherwise clear. Inner ear structures grossly normal. Bone marrow signal intensity within normal limits. No scalp soft tissue abnormality. IMPRESSION: 1. 5 mm acute ischemic nonhemorrhagic infarct within the left centrum semi ovale. No significant mass effect. 2. Remote lacunar infarcts involving the left lentiform nucleus and right thalamus. 3. Age-related cerebral atrophy with moderate chronic microvascular ischemic disease. Electronically Signed   By: Rise MuBenjamin  McClintock M.D.   On: 05/31/2015 20:20   [place] Carotid Bilateral  06/01/2015  CLINICAL DATA:  Left hemispheric cerebral infarction, hypertension, syncope, hyperlipidemia, diabetes and tobacco use. EXAM: BILATERAL CAROTID DUPLEX ULTRASOUND TECHNIQUE: Wallace CullensGray scale imaging, color Doppler and duplex ultrasound were performed of bilateral carotid and vertebral arteries in the neck. COMPARISON:  None. FINDINGS: Criteria: Quantification of carotid stenosis is based on velocity parameters that correlate the residual internal carotid diameter with NASCET-based stenosis levels, using the diameter of the distal internal carotid lumen as the denominator for stenosis measurement. The following velocity measurements were obtained: RIGHT ICA:  102/24 cm/sec CCA:  81/15 cm/sec SYSTOLIC ICA/CCA RATIO:  1.3 DIASTOLIC ICA/CCA RATIO:  1.6 ECA:  223 cm/sec LEFT ICA:  75/19 cm/sec CCA:  54/13 cm/sec SYSTOLIC ICA/CCA RATIO:  1.4 DIASTOLIC ICA/CCA RATIO:  1.4 ECA:  187 cm/sec RIGHT CAROTID ARTERY: There is a moderate amount of predominately calcified plaque at the level of the carotid bulb and proximal internal carotid artery. Based on velocities, estimated right ICA stenosis is less than 50%. Based on grayscale images, stenosis is likely closer to the 50% range. RIGHT VERTEBRAL ARTERY: Antegrade flow with normal waveform and  velocity. LEFT CAROTID ARTERY: Moderate amount of partially calcified plaque is present at the level  of the left carotid bulb and proximal internal carotid artery. Velocities correspond to an estimated less than 50% left ICA stenosis. LEFT VERTEBRAL ARTERY: Antegrade flow with normal waveform and velocity. IMPRESSION: Moderate calcified plaque at the level of both carotid bulbs and proximal internal carotid arteries. Plaque burden slightly more prominent on the right compared to the left by ultrasound. Bilateral ICA stenoses are estimated at less than 50% based on velocity criteria. Based on grayscale imaging, the right ICA stenosis is likely closer to the 50% range. Electronically Signed   By: Irish Lack M.D.   On: 06/01/2015 09:11      Medications:   Impression: Hyperlipidemia  Transient ischemic attack now clinically resolved Principal Problem:   Acute ischemic stroke [facility] Active Problems:   Tobacco abuse   Diabetes mellitus without complication (HCC)   Hypertension   COPD exacerbation (HCC)     Plan: Aspirin 325 by mouth daily neurology consult requested for today neuro checks every 4 hours 2-D echo is pending at present  Consultants: Neurology requested   Procedures   Antibiotics:                   Code Status: Full  Family Communication:  Spoke with son and daughter-in-law in room as well as patient  Disposition Plan see plan above  Time spent: 30 minutes   LOS: 1 day   Andi Mahaffy M   06/01/2015, 1:42 PM

## 2015-06-01 NOTE — Care Management Note (Signed)
Case Management Note  Patient Details  Name: Clint GuyJane F Bussiere MRN: [id number] Date of Birth: 1946/06/18  Subjective/Objective:                  Pt admitted from home with CVA. Pt lives alone and will return home at discharge. Pt is independent with ADL's. Pt has a quad cane that she use.  Action/Plan: No CM needs anticipated.  Expected Discharge Date:                  Expected Discharge Plan:  Home/Self Care  In-House Referral:  NA  Discharge planning Services  CM Consult  Post Acute Care Choice:  NA Choice offered to:  NA  DME Arranged:    DME Agency:     HH Arranged:    HH Agency:     Status of Service:  Completed, signed off  Medicare Important Message Given:    Date Medicare IM Given:    Medicare IM give by:    Date Additional Medicare IM Given:    Additional Medicare Important Message give by:     If discussed at Long Length of Stay Meetings, dates discussed:    Additional Comments:  Cheryl FlashBlackwell, Taralee Marcus Crowder, RN 06/01/2015, 2:51 PM

## 2015-06-01 NOTE — Progress Notes (Signed)
SLP Cancellation Note  Patient Details Name: Tara Park MRN: [id number] DOB: 5/[name]10/1947   Cancelled treatment:       Reason Eval/Treat Not Completed: SLP screened, no needs identified, will sign off; Pt passed RN swallow screen shortly after admission so swallow evaluation deferred/not needed. SLP alerted RN who called and spoke with Dr. Delbert Harnesson Diego. SLP screened pt at bedside who reports return to baseline status shortly after admission. Pt denies any changes in speech, language, cognition, or swallowing. SLP will sign off.   Thank you,  Havery MorosDabney Domonic Kimball, CCC-SLP [phone]    [name] 06/01/2015, 12:17 PM

## 2015-06-01 NOTE — Evaluation (Signed)
  Occupational Therapy Evaluation Patient Details Name: Tara Park MRN: [id number] DOB: 06-Aug-1945 Today's Date: 06/01/2015    History of Present Illness Pt is a 69 y.o. female with PMH of COPD, hypertension, hyperlipidemia, diabetes mellitus, tobacco abuse, COPD, who presented with left-sided weakness.  Patient reports that she started having sudden onset left-sided weakness when she was sitting in the chair at about 18:06. She did not have vision change or hearing loss. Denies tingling sensations. The symptoms lasted for about 15-20 minutes, then resolved spontaneously.   Clinical Impression   Pt awake, alert, and oriented this am. Returning from MRI on OT arrival. Pt demonstrates range of motion and strength WNL, mod I in bed mobility and ADL tasks. Pt reports symptoms lasted a short time and resolved prior to arriving at hospital. Pts son lives nearby and grandson lives down the street, family available to assist pt when pt calls. Pt appears to be at functional baseline with ADL tasks, no further OT services required at this time.     Follow Up Recommendations  No OT follow up    Equipment Recommendations  None recommended by OT       Precautions / Restrictions Precautions Precautions: None Restrictions Weight Bearing Restrictions: No      Mobility Bed Mobility Overal bed mobility: Modified Independent                Transfers Overall transfer level: Modified independent                         ADL Overall ADL's : Modified independent                     Lower Body Dressing: Modified independent                       Vision Vision Assessment?: No apparent visual deficits          Pertinent Vitals/Pain Pain Assessment: No/denies pain     Hand Dominance Right   Extremity/Trunk Assessment Upper Extremity Assessment Upper Extremity Assessment: Overall WFL for tasks assessed   Lower Extremity Assessment Lower Extremity  Assessment: Defer to PT evaluation       Communication Communication Communication: No difficulties   Cognition Arousal/Alertness: Awake/alert Behavior During Therapy: WFL for tasks assessed/performed Overall Cognitive Status: Within Functional Limits for tasks assessed                                Home Living Family/patient expects to be discharged to:: Private residence Living Arrangements: Alone Available Help at Discharge: Family;Available PRN/intermittently                         Home Equipment: Gilmer MorCane - single point;Shower seat - built in          Prior Functioning/Environment Level of Independence: Independent with assistive device(s)              End of Session    Activity Tolerance: Patient tolerated treatment well Patient left: in bed;with call bell/phone within reach;with bed alarm set   Time: [id number] OT Time Calculation (min): 18 min Charges:  OT General Charges $OT Visit: 1 Procedure OT Evaluation $Initial OT Evaluation Tier I: 1 Procedure  Ezra SitesLeslie Mihir Flanigan, OTR/L  [phone]  06/01/2015, 9:21 AM

## 2015-06-01 NOTE — Progress Notes (Signed)
Pt refuses yellow socks and fall risk precautions. Pt educated on fall risks. Will continue to monitor pt.

## 2015-06-01 NOTE — Consult Note (Addendum)
Renfrow A. Merlene Laughter, MD     [email or web address]          Tara Park is an 69 y.o. female.   ASSESSMENT/PLAN: Acute lacunar infarction. Risk factors DM, HTN, tobacco use, and age.  RECOMMENDATION: Agree with antiplatelet aspirin therapy. 325 mg daily as recommended. Smoking cessation. Diabetes control. Blood pressure control. Stop Naproxen and NSAIDS -  given the known risks to increase thromboembolic event such as stroke and heart attack.  The patient is 70 year old white female who is functional and independent at baseline. The patient has requested over-the-counter medications for her chronic cough which she has because of COPD. The patient reports that she just didn't feel right although she did not lose consciousness or had alteration of consciousness. She tells me that shortly afterwards she reports having weakness and incoordination of the left upper extremity and left leg. She called her son and he noted that she had significant slurring of her speech. The patient does not report having visual obscuration or diplopia. No chest pain is reported. No shortness of breath and was about the same as she typically has. The entire event lasted for about 20 minutes. She returned to baseline. No GI or GU symptoms are reported. The review of systems otherwise negative.  GENERAL: This is a pleasant female in no acute distress.  HEENT: Supple. Atraumatic normocephalic.   ABDOMEN: soft  EXTREMITIES: There is marked pitting edema of the right distal leg and ankle with marked limitation of dorsiflexion and the plantarflexion of the ankle.  She had a fracture there and it is fused. Significant arthritic changes of the knees bilaterally.   BACK: Normal.  SKIN: Normal by inspection.    MENTAL STATUS: Alert and oriented. Month and age are stated appropriately. Speech, language and cognition are generally intact. Judgment and insight normal.   CRANIAL NERVES: Pupils  are equal, round and reactive to light and accommodation; extra ocular movements are full, there is no significant nystagmus; visual fields are full; upper and lower facial muscles are normal in strength and symmetric, there is no flattening of the nasolabial folds; tongue is midline; uvula is midline; shoulder elevation is normal.  MOTOR: Normal tone, bulk and strength; no pronator drift.  COORDINATION: Left finger to nose is normal, right finger to nose is normal, No rest tremor; no intention tremor; no postural tremor; no bradykinesia.  REFLEXES: Deep tendon reflexes are symmetrical and normal. Babinski reflexes are flexor bilaterally.   SENSATION: Normal to light touch. Normal tactile and visual double simultaneous stimulation.  NIH stroke scale 0.   Blood pressure 137/85, pulse 98, temperature 98.6 F (37 C), temperature source Oral, resp. rate 20, height _0  (1.626 m), weight 63.1 kg (139 lb 1.8 oz), SpO2 94 %.  Past Medical History  Diagnosis Date  . Diabetes mellitus without complication ([place])   . Hypertension   . Tobacco abuse   . Use of cane as ambulatory aid   . Leg edema, right     chronic    Past Surgical History  Procedure Laterality Date  . Cataract extraction    . Hip surgery    . Foot surgery      Family History  Problem Relation Age of Onset  . Transient ischemic attack Mother   . COPD Father   . Heart disease Brother     Social History:  reports that she has been smoking Cigarettes.  She does not have any smokeless tobacco history on file. She reports  that she does not drink alcohol or use illicit drugs.  Allergies: No Known Allergies  Medications: Prior to Admission medications   Medication Sig Start Date End Date Taking? Authorizing Provider  albuterol (PROAIR HFA) 108 (90 BASE) MCG/ACT inhaler Inhale 2 puffs into the lungs every 6 (six) hours as needed for wheezing or shortness of breath.   Yes Historical Provider, MD  albuterol (PROVENTIL) (2.5  MG/3ML) 0.083% nebulizer solution Take 2.5 mg by nebulization every 6 (six) hours as needed for wheezing or shortness of breath.   Yes Historical Provider, MD  chlorpheniramine (CHLOR-TRIMETON) 4 MG tablet Take 4 mg by mouth every 4 (four) hours.   Yes Historical Provider, MD  COMBIVENT RESPIMAT 20-100 MCG/ACT AERS respimat Inhale 2 puffs into the lungs 4 (four) times daily.  05/23/15  Yes Historical Provider, MD  HYDROcodone-acetaminophen (NORCO) 7.5-325 MG tablet Take 1 tablet by mouth every 6 (six) hours as needed for moderate pain or severe pain.  05/04/15  Yes Historical Provider, MD  lisinopril (PRINIVIL,ZESTRIL) 10 MG tablet Take 10 mg by mouth every morning.   Yes Historical Provider, MD  metFORMIN (GLUCOPHAGE) 500 MG tablet Take 500 mg by mouth 2 (two) times daily with a meal.   Yes Historical Provider, MD  mometasone (NASONEX) 50 MCG/ACT nasal spray Place 1-2 sprays into the nose daily as needed (for allergies).    Yes Historical Provider, MD  naproxen (NAPROSYN) 500 MG tablet Take 500 mg by mouth 2 (two) times daily. 04/27/15  Yes Historical Provider, MD  phenylephrine (SUDAFED PE) 10 MG TABS tablet Take 10 mg by mouth every 4 (four) hours.   Yes Historical Provider, MD  pravastatin (PRAVACHOL) 40 MG tablet Take 40 mg by mouth every evening.   Yes Historical Provider, MD  traZODone (DESYREL) 50 MG tablet Take 50 mg by mouth at bedtime. 05/09/15  Yes Historical Provider, MD  CHANTIX CONTINUING MONTH PAK 1 MG tablet Take 1 tablet by mouth 2 (two) times daily. 05/06/15   Historical Provider, MD    Scheduled Meds: . aspirin  300 mg Rectal Daily   Or  . aspirin  325 mg Oral Daily  . [START ON 06/02/2015] azithromycin  250 mg Oral Daily  . dextromethorphan-guaiFENesin  1 tablet Oral BID  . fluticasone  1 spray Each Nare Daily  . heparin  5,000 Units Subcutaneous 3 times per day  . insulin aspart  0-9 Units Subcutaneous TID WC  . ipratropium-albuterol  3 mL Nebulization QID  .  methylPREDNISolone (SOLU-MEDROL) injection  60 mg Intravenous Daily  . naproxen  500 mg Oral BID  . pravastatin  40 mg Oral QPM  . traZODone  50 mg Oral QHS  . varenicline  1 mg Oral BID WC   Continuous Infusions: . sodium chloride 75 mL/hr at 06/01/15 0011   PRN Meds:.albuterol, HYDROcodone-acetaminophen, senna-docusate     Results for orders placed or performed during the hospital encounter of 05/31/15 (from the past 48 hour(s))  CBC     Status: Abnormal   Collection Time: 05/31/15  8:20 PM  Result Value Ref Range   WBC 7.4 4.0 - 10.5 K/uL   RBC 5.79 (H) 3.87 - 5.11 MIL/uL   Hemoglobin 16.1 (H) 12.0 - 15.0 g/dL   HCT 46.2 (H) 36.0 - 46.0 %   MCV 79.8 78.0 - 100.0 fL   MCH 27.8 26.0 - 34.0 pg   MCHC 34.8 30.0 - 36.0 g/dL   RDW 14.1 11.5 - 15.5 %   Platelets 201  150 - 400 K/uL  Differential     Status: None   Collection Time: 05/31/15  8:20 PM  Result Value Ref Range   Neutrophils Relative % 67 %   Neutro Abs 5.0 1.7 - 7.7 K/uL   Lymphocytes Relative 20 %   Lymphs Abs 1.5 0.7 - 4.0 K/uL   Monocytes Relative 8 %   Monocytes Absolute 0.6 0.1 - 1.0 K/uL   Eosinophils Relative 4 %   Eosinophils Absolute 0.3 0.0 - 0.7 K/uL   Basophils Relative 1 %   Basophils Absolute 0.1 0.0 - 0.1 K/uL  Urine rapid drug screen (hosp performed)not at [facility]     Status: Abnormal   Collection Time: 05/31/15  8:20 PM  Result Value Ref Range   Opiates NONE DETECTED NONE DETECTED   Cocaine NONE DETECTED NONE DETECTED   Benzodiazepines NONE DETECTED NONE DETECTED   Amphetamines NONE DETECTED NONE DETECTED   Tetrahydrocannabinol POSITIVE (A) NONE DETECTED   Barbiturates NONE DETECTED NONE DETECTED    Comment:        DRUG SCREEN FOR MEDICAL PURPOSES ONLY.  IF CONFIRMATION IS NEEDED FOR ANY PURPOSE, NOTIFY LAB WITHIN 5 DAYS.        LOWEST DETECTABLE LIMITS FOR URINE DRUG SCREEN Drug Class       Cutoff (ng/mL) Amphetamine      1000 Barbiturate      200 Benzodiazepine   614 Tricyclics        431 Opiates          300 Cocaine          300 THC              50   Urinalysis, Routine w reflex microscopic (not at [facility])     Status: None   Collection Time: 05/31/15  8:20 PM  Result Value Ref Range   Color, Urine YELLOW YELLOW   APPearance CLEAR CLEAR   Specific Gravity, Urine 1.010 1.005 - 1.030   pH 6.5 5.0 - 8.0   Glucose, UA NEGATIVE NEGATIVE mg/dL   Hgb urine dipstick NEGATIVE NEGATIVE   Bilirubin Urine NEGATIVE NEGATIVE   Ketones, ur NEGATIVE NEGATIVE mg/dL   Protein, ur NEGATIVE NEGATIVE mg/dL   Urobilinogen, UA 0.2 0.0 - 1.0 mg/dL   Nitrite NEGATIVE NEGATIVE   Leukocytes, UA NEGATIVE NEGATIVE    Comment: MICROSCOPIC NOT DONE ON URINES WITH NEGATIVE PROTEIN, BLOOD, LEUKOCYTES, NITRITE, OR GLUCOSE <1000 mg/dL.  I-Stat Chem 8, ED  (not at [facility], [facility])     Status: Abnormal   Collection Time: 05/31/15  8:26 PM  Result Value Ref Range   Sodium 139 135 - 145 mmol/L   Potassium 7.2 (HH) 3.5 - 5.1 mmol/L   Chloride 107 101 - 111 mmol/L   BUN 30 (H) 6 - 20 mg/dL   Creatinine, Ser 0.70 0.44 - 1.00 mg/dL   Glucose, Bld 95 65 - 99 mg/dL   Calcium, Ion 1.06 (L) 1.13 - 1.30 mmol/L   TCO2 25 0 - 100 mmol/L   Hemoglobin 17.3 (H) 12.0 - 15.0 g/dL   HCT 51.0 (H) 36.0 - 46.0 %   Comment NOTIFIED PHYSICIAN   I-stat troponin, ED (not at [facility], [facility])     Status: None   Collection Time: 05/31/15  8:26 PM  Result Value Ref Range   Troponin i, poc 0.01 0.00 - 0.08 ng/mL   Comment 3            Comment: Due to the release kinetics of  cTnI, a negative result within the first hours of the onset of symptoms does not rule out myocardial infarction with certainty. If myocardial infarction is still suspected, repeat the test at appropriate intervals.   Ethanol     Status: None   Collection Time: 05/31/15  8:45 PM  Result Value Ref Range   Alcohol, Ethyl (B) <5 <5 mg/dL    Comment:        LOWEST DETECTABLE LIMIT FOR SERUM ALCOHOL IS 5 mg/dL FOR MEDICAL PURPOSES ONLY   Protime-INR      Status: None   Collection Time: 05/31/15  8:45 PM  Result Value Ref Range   Prothrombin Time 14.5 11.6 - 15.2 seconds   INR 1.11 0.00 - 1.49  APTT     Status: None   Collection Time: 05/31/15  8:45 PM  Result Value Ref Range   aPTT 27 24 - 37 seconds  Comprehensive metabolic panel     Status: Abnormal   Collection Time: 05/31/15  8:45 PM  Result Value Ref Range   Sodium 140 135 - 145 mmol/L   Potassium 4.4 3.5 - 5.1 mmol/L    Comment: DELTA CHECK NOTED   Chloride 108 101 - 111 mmol/L   CO2 26 22 - 32 mmol/L   Glucose, Bld 101 (H) 65 - 99 mg/dL   BUN 20 6 - 20 mg/dL   Creatinine, Ser 0.77 0.44 - 1.00 mg/dL   Calcium 8.8 (L) 8.9 - 10.3 mg/dL   Total Protein 6.9 6.5 - 8.1 g/dL   Albumin 3.6 3.5 - 5.0 g/dL   AST 24 15 - 41 U/L   ALT 23 14 - 54 U/L   Alkaline Phosphatase 126 38 - 126 U/L   Total Bilirubin 0.4 0.3 - 1.2 mg/dL   GFR calc non Af Amer >60 >60 mL/min   GFR calc Af Amer >60 >60 mL/min    Comment: (NOTE) The eGFR has been calculated using the CKD EPI equation. This calculation has not been validated in all clinical situations. eGFR's persistently <60 mL/min signify possible Chronic Kidney Disease.    Anion gap 6 5 - 15  Influenza panel by PCR (type A & B, H1N1)     Status: None   Collection Time: 06/01/15 12:34 AM  Result Value Ref Range   Influenza A By PCR NEGATIVE NEGATIVE   Influenza B By PCR NEGATIVE NEGATIVE   H1N1 flu by pcr NOT DETECTED NOT DETECTED    Comment:        The Xpert Flu assay (FDA approved for nasal aspirates or washes and nasopharyngeal swab specimens), is intended as an aid in the diagnosis of influenza and should not be used as a sole basis for treatment.   Glucose, capillary     Status: Abnormal   Collection Time: 06/01/15 12:39 AM  Result Value Ref Range   Glucose-Capillary 105 (H) 65 - 99 mg/dL   Comment 1 Notify RN    Comment 2 Document in Chart   Strep pneumoniae urinary antigen     Status: None   Collection Time: 06/01/15   6:16 AM  Result Value Ref Range   Strep Pneumo Urinary Antigen NEGATIVE NEGATIVE    Comment:        Infection due to S. pneumoniae cannot be absolutely ruled out since the antigen present may be below the detection limit of the test. Performed at [facility]   Lipid panel     Status: Abnormal   Collection Time: 06/01/15  6:18 AM  Result Value Ref Range   Cholesterol 155 0 - 200 mg/dL   Triglycerides 67 <150 mg/dL   HDL 29 (L) >40 mg/dL   Total CHOL/HDL Ratio 5.3 RATIO   VLDL 13 0 - 40 mg/dL   LDL Cholesterol 113 (H) 0 - 99 mg/dL    Comment:        Total Cholesterol/HDL:CHD Risk Coronary Heart Disease Risk Table                     Men   Women  1/2 Average Risk   3.4   3.3  Average Risk       5.0   4.4  2 X Average Risk   9.6   7.1  3 X Average Risk  23.4   11.0        Use the calculated Patient Ratio above and the CHD Risk Table to determine the patient's CHD Risk.        ATP III CLASSIFICATION (LDL):  <100     mg/dL   Optimal  100-129  mg/dL   Near or Above                    Optimal  130-159  mg/dL   Borderline  160-189  mg/dL   High  >190     mg/dL   Very High   Brain natriuretic peptide     Status: Abnormal   Collection Time: 06/01/15  6:18 AM  Result Value Ref Range   B Natriuretic Peptide 203.0 (H) 0.0 - 100.0 pg/mL  Glucose, capillary     Status: Abnormal   Collection Time: 06/01/15  9:36 AM  Result Value Ref Range   Glucose-Capillary 140 (H) 65 - 99 mg/dL   Comment 1 Notify RN   Glucose, capillary     Status: Abnormal   Collection Time: 06/01/15 11:53 AM  Result Value Ref Range   Glucose-Capillary 130 (H) 65 - 99 mg/dL  Glucose, capillary     Status: Abnormal   Collection Time: 06/01/15  5:16 PM  Result Value Ref Range   Glucose-Capillary 146 (H) 65 - 99 mg/dL   Comment 1 Notify RN    Comment 2 Document in Chart     Studies/Results: Carotid dopplers fine: ECHO pending    Izick Gasbarro A. Merlene Laughter, M.D.  Diplomate, Tax adviser of Psychiatry  and Neurology ( Neurology). 06/01/2015, 5:40 PM

## 2015-06-01 NOTE — Progress Notes (Signed)
PT Cancellation Note  Patient Details Name: Tara GuyJane F Nunziato MRN: [id number] DOB: 21-Feb-1946   Cancelled Treatment:    Reason Eval/Treat Not Completed: PT screened, no needs identified, will sign off. Chart reviewed, RN consulted. Pt screened, with no acute abnormality in terms of sensation, strength, mobility, or balance. All symptoms initially reported as they pertain to CVA has resolved at this time. Of note: pt demonstrating uncontrolled tachycardia into 130's after gait trial. Pt denies any previous problems with irregular heart rhythm. PT signing off: no further PT services required at this time. Please place new orders if additional services are warranted.    Mellisa Arshad C 06/01/2015, 2:28 PM  2:30 PM  Rosamaria LintsAllan C Braylan Faul, PT, DPT [place] License # [id number]

## 2015-06-01 NOTE — Plan of Care (Signed)
Problem: Education: Goal: Knowledge of patient specific risk factors addressed and post discharge goals established will improve Outcome: Progressing Pt still a current smoker. Discussed smoking cessation. Discussed diet and importance of taking blood sugars with pt.   Problem: Coping: Goal: Ability to verbalize positive feelings about self will improve Outcome: Progressing Pt verbalizes that she needs to make lifestyle modifications.

## 2015-06-02 ENCOUNTER — Inpatient Hospital Stay (HOSPITAL_COMMUNITY): Payer: Medicare Other

## 2015-06-02 DIAGNOSIS — I10 Essential (primary) hypertension: Secondary | ICD-10-CM

## 2015-06-02 DIAGNOSIS — I6350 Cerebral infarction due to unspecified occlusion or stenosis of unspecified cerebral artery: Secondary | ICD-10-CM

## 2015-06-02 LAB — GLUCOSE, CAPILLARY
GLUCOSE-CAPILLARY: 181 mg/dL — AB (ref 65–99)
GLUCOSE-CAPILLARY: 165 mg/dL — AB (ref 65–99)
Glucose-Capillary: 142 mg/dL — ABNORMAL HIGH (ref 65–99)
Glucose-Capillary: 114 mg/dL — ABNORMAL HIGH (ref 65–99)
Glucose-Capillary: 153 mg/dL — ABNORMAL HIGH (ref 65–99)

## 2015-06-02 LAB — BASIC METABOLIC PANEL
ANION GAP: 9 (ref 5–15)
BUN: 27 mg/dL — ABNORMAL HIGH (ref 6–20)
CHLORIDE: 108 mmol/L (ref 101–111)
CO2: 22 mmol/L (ref 22–32)
Calcium: 9 mg/dL (ref 8.9–10.3)
Creatinine, Ser: 0.86 mg/dL (ref 0.44–1.00)
GFR calc Af Amer: >60 mL/min (ref >60)
GFR calc non Af Amer: >60 mL/min (ref >60)
GLUCOSE: 149 mg/dL — AB (ref 65–99)
POTASSIUM: 4.4 mmol/L (ref 3.5–5.1)
Sodium: 139 mmol/L (ref 135–145)

## 2015-06-02 LAB — CBC
HEMATOCRIT: 43 % (ref 36.0–46.0)
HEMOGLOBIN: 14.8 g/dL (ref 12.0–15.0)
MCH: 27.2 pg (ref 26.0–34.0)
MCHC: 34.4 g/dL (ref 30.0–36.0)
MCV: 79 fL (ref 78.0–100.0)
Platelets: 212 10*3/uL (ref 150–400)
RBC: 5.44 MIL/uL — AB (ref 3.87–5.11)
RDW: 14.1 % (ref 11.5–15.5)
WBC: 11.7 10*3/uL — AB (ref 4.0–10.5)

## 2015-06-02 LAB — HEMOGLOBIN A1C
Hgb A1c MFr Bld: 6 % — ABNORMAL HIGH (ref 4.8–5.6)
Mean Plasma Glucose: 126 mg/dL

## 2015-06-02 LAB — PROTIME-INR
INR: 1.08 (ref 0.00–1.49)
Prothrombin Time: 14.2 seconds (ref 11.6–15.2)

## 2015-06-02 LAB — APTT: aPTT: 25 seconds (ref 24–37)

## 2015-06-02 LAB — TROPONIN I: Troponin I: 0.71 ng/mL (ref <0.031)

## 2015-06-02 MED ORDER — METHYLPREDNISOLONE SODIUM SUCC 125 MG IJ SOLR
125.0000 mg | Freq: Three times a day (TID) | INTRAMUSCULAR | Status: DC
  Administered 2015-06-02 – 2015-06-05 (×8): 125 mg via INTRAVENOUS
  Filled 2015-06-02 (×8): qty 2

## 2015-06-02 MED ORDER — IPRATROPIUM-ALBUTEROL 0.5-2.5 (3) MG/3ML IN SOLN
3.0000 mL | RESPIRATORY_TRACT | Status: DC
  Administered 2015-06-02 – 2015-06-03 (×8): 3 mL via RESPIRATORY_TRACT
  Filled 2015-06-02 (×8): qty 3

## 2015-06-02 MED ORDER — SODIUM CHLORIDE 0.9 % IV SOLN: INTRAVENOUS | Status: DC

## 2015-06-02 MED ORDER — FUROSEMIDE 10 MG/ML IJ SOLN
40.0000 mg | Freq: Once | INTRAMUSCULAR | Status: AC
  Administered 2015-06-02: 40 mg via INTRAVENOUS
  Filled 2015-06-02: qty 4

## 2015-06-02 MED ORDER — IOHEXOL 350 MG/ML SOLN
75.0000 mL | Freq: Once | INTRAVENOUS | Status: AC | PRN
  Administered 2015-06-02: 75 mL via INTRAVENOUS

## 2015-06-02 MED ORDER — LORAZEPAM 1 MG PO TABS
1.0000 mg | ORAL_TABLET | Freq: Every evening | ORAL | Status: DC | PRN
  Administered 2015-06-02 – 2015-06-09 (×6): 1 mg via ORAL
  Filled 2015-06-02 (×6): qty 1

## 2015-06-02 NOTE — Progress Notes (Signed)
Spoke with Dr. Janna ArchonDiego to update him on pt status. Orders received. Dr. Gerilyn Pilgrimoonquah just arrived to floor to check in on pt.

## 2015-06-02 NOTE — Progress Notes (Signed)
Patient ID: Tara Park, female   DOB: [birth year], 69 y.o.   MRN: [id number]  [place] A. Merlene Laughter, MD     [email or web address]          Tara Park is an 69 y.o. female.   Assessment/Plan:  It appears that the patient has had an acute event at about 1:00pm today she developed the acute onset of dysarthria and left upper extremity weakness. Stroke code was called. She had imaging which is reviewed and shows nothing acute. There is some concern that she may be having embolic issues although I think this is most likely small vessel ischemic problems. She currently seems somewhat dyspneic. This is being evaluated by her PCP. As this is most likely small vessel issue, recommend continuation of aspirin antiplatelet therapy. The patient most likely had a TIA a couple days ago and now has progressed to a completed infarct unfortunately.  Acute left subcortical infarct likely asymptomatic given the location.   Multiple bilateral lacunar infarcts.  Acute lacunar infarction. Risk factors DM, HTN, tobacco use, and age.  RECOMMENDATION: Agree with antiplatelet aspirin therapy. 325 mg daily as recommended. Smoking cessation. Diabetes control. Blood pressure control. Stop Naproxen and NSAIDS - given the known risks to increase thromboembolic event such as stroke and heart attack.    GENERAL: She is acutely dyspneic.  HEENT: Supple. Atraumatic normocephalic.   ABDOMEN: soft  EXTREMITIES: There is marked pitting edema of the right distal leg and ankle with marked limitation of dorsiflexion and the plantarflexion of the ankle.  She had a fracture there and it is fused. Significant arthritic changes of the knees bilaterally.   BACK: Normal.  SKIN: Normal by inspection.   MENTAL STATUS: Alert and oriented. She does have moderate dysarthria. There is flattening of the nasolabial fold on the left side.   CRANIAL NERVES: Pupils are equal, round and reactive to light  and accommodation; extra ocular movements are full, there is no significant nystagmus; visual fields are full; upper and lower facial muscles are normal in strength and symmetric, there is no flattening of the nasolabial folds; tongue is midline; uvula is midline; shoulder elevation is normal.  MOTOR:Right side shows normal tone, bulk and strength. Left upper extremity 4+/5. Left leg 5; LUE  drift.  COORDINATION: Left finger to nose is normal, right finger to nose is normal, No rest tremor; no intention tremor; no postural tremor; no bradykinesia.  REFLEXES: Deep tendon reflexes are symmetrical and normal. Babinski reflexes are flexor bilaterally.   SENSATION: Normal to light touch. Normal tactile and visual double simultaneous stimulation.  NIH stroke scale 2.     Objective: Vital signs in last 24 hours: Temp:  [97.5 F (36.4 C)-98.7 F (37.1 C)] 98 F (36.7 C) (11/10 1746) Pulse Rate:  [88-124] 122 (11/10 1746) Resp:  [18-24] 24 (11/10 1746) BP: (98-170)/(55-111) 153/83 mmHg (11/10 1746) SpO2:  [89 %-99 %] 97 % (11/10 1746)  Intake/Output from previous day: 11/09 0701 - 11/10 0700 In: 720 [P.O.:720] Out: 300 [Urine:300] Intake/Output this shift: Total I/O In: 480 [P.O.:480] Out: -  Nutritional status: Diet NPO time specified   Lab Results: Results for orders placed or performed during the hospital encounter of 05/31/15 (from the past 48 hour(s))  CBC     Status: Abnormal   Collection Time: 05/31/15  8:20 PM  Result Value Ref Range   WBC 7.4 4.0 - 10.5 K/uL   RBC 5.79 (H) 3.87 - 5.11 MIL/uL   Hemoglobin 16.1 (H)  12.0 - 15.0 g/dL   HCT 46.2 (H) 36.0 - 46.0 %   MCV 79.8 78.0 - 100.0 fL   MCH 27.8 26.0 - 34.0 pg   MCHC 34.8 30.0 - 36.0 g/dL   RDW 14.1 11.5 - 15.5 %   Platelets 201 150 - 400 K/uL  Differential     Status: None   Collection Time: 05/31/15  8:20 PM  Result Value Ref Range   Neutrophils Relative % 67 %   Neutro Abs 5.0 1.7 - 7.7 K/uL   Lymphocytes  Relative 20 %   Lymphs Abs 1.5 0.7 - 4.0 K/uL   Monocytes Relative 8 %   Monocytes Absolute 0.6 0.1 - 1.0 K/uL   Eosinophils Relative 4 %   Eosinophils Absolute 0.3 0.0 - 0.7 K/uL   Basophils Relative 1 %   Basophils Absolute 0.1 0.0 - 0.1 K/uL  Urine rapid drug screen (hosp performed)not at [facility]     Status: Abnormal   Collection Time: 05/31/15  8:20 PM  Result Value Ref Range   Opiates NONE DETECTED NONE DETECTED   Cocaine NONE DETECTED NONE DETECTED   Benzodiazepines NONE DETECTED NONE DETECTED   Amphetamines NONE DETECTED NONE DETECTED   Tetrahydrocannabinol POSITIVE (A) NONE DETECTED   Barbiturates NONE DETECTED NONE DETECTED    Comment:        DRUG SCREEN FOR MEDICAL PURPOSES ONLY.  IF CONFIRMATION IS NEEDED FOR ANY PURPOSE, NOTIFY LAB WITHIN 5 DAYS.        LOWEST DETECTABLE LIMITS FOR URINE DRUG SCREEN Drug Class       Cutoff (ng/mL) Amphetamine      1000 Barbiturate      200 Benzodiazepine   863 Tricyclics       817 Opiates          300 Cocaine          300 THC              50   Urinalysis, Routine w reflex microscopic (not at [facility])     Status: None   Collection Time: 05/31/15  8:20 PM  Result Value Ref Range   Color, Urine YELLOW YELLOW   APPearance CLEAR CLEAR   Specific Gravity, Urine 1.010 1.005 - 1.030   pH 6.5 5.0 - 8.0   Glucose, UA NEGATIVE NEGATIVE mg/dL   Hgb urine dipstick NEGATIVE NEGATIVE   Bilirubin Urine NEGATIVE NEGATIVE   Ketones, ur NEGATIVE NEGATIVE mg/dL   Protein, ur NEGATIVE NEGATIVE mg/dL   Urobilinogen, UA 0.2 0.0 - 1.0 mg/dL   Nitrite NEGATIVE NEGATIVE   Leukocytes, UA NEGATIVE NEGATIVE    Comment: MICROSCOPIC NOT DONE ON URINES WITH NEGATIVE PROTEIN, BLOOD, LEUKOCYTES, NITRITE, OR GLUCOSE <1000 mg/dL.  I-Stat Chem 8, ED  (not at [facility], [facility])     Status: Abnormal   Collection Time: 05/31/15  8:26 PM  Result Value Ref Range   Sodium 139 135 - 145 mmol/L   Potassium 7.2 (HH) 3.5 - 5.1 mmol/L   Chloride 107 101 - 111 mmol/L   BUN 30  (H) 6 - 20 mg/dL   Creatinine, Ser 0.70 0.44 - 1.00 mg/dL   Glucose, Bld 95 65 - 99 mg/dL   Calcium, Ion 1.06 (L) 1.13 - 1.30 mmol/L   TCO2 25 0 - 100 mmol/L   Hemoglobin 17.3 (H) 12.0 - 15.0 g/dL   HCT 51.0 (H) 36.0 - 46.0 %   Comment NOTIFIED PHYSICIAN   I-stat troponin, ED (not at [facility], [facility])  Status: None   Collection Time: 05/31/15  8:26 PM  Result Value Ref Range   Troponin i, poc 0.01 0.00 - 0.08 ng/mL   Comment 3            Comment: Due to the release kinetics of cTnI, a negative result within the first hours of the onset of symptoms does not rule out myocardial infarction with certainty. If myocardial infarction is still suspected, repeat the test at appropriate intervals.   Ethanol     Status: None   Collection Time: 05/31/15  8:45 PM  Result Value Ref Range   Alcohol, Ethyl (B) <5 <5 mg/dL    Comment:        LOWEST DETECTABLE LIMIT FOR SERUM ALCOHOL IS 5 mg/dL FOR MEDICAL PURPOSES ONLY   Protime-INR     Status: None   Collection Time: 05/31/15  8:45 PM  Result Value Ref Range   Prothrombin Time 14.5 11.6 - 15.2 seconds   INR 1.11 0.00 - 1.49  APTT     Status: None   Collection Time: 05/31/15  8:45 PM  Result Value Ref Range   aPTT 27 24 - 37 seconds  Comprehensive metabolic panel     Status: Abnormal   Collection Time: 05/31/15  8:45 PM  Result Value Ref Range   Sodium 140 135 - 145 mmol/L   Potassium 4.4 3.5 - 5.1 mmol/L    Comment: DELTA CHECK NOTED   Chloride 108 101 - 111 mmol/L   CO2 26 22 - 32 mmol/L   Glucose, Bld 101 (H) 65 - 99 mg/dL   BUN 20 6 - 20 mg/dL   Creatinine, Ser 0.77 0.44 - 1.00 mg/dL   Calcium 8.8 (L) 8.9 - 10.3 mg/dL   Total Protein 6.9 6.5 - 8.1 g/dL   Albumin 3.6 3.5 - 5.0 g/dL   AST 24 15 - 41 U/L   ALT 23 14 - 54 U/L   Alkaline Phosphatase 126 38 - 126 U/L   Total Bilirubin 0.4 0.3 - 1.2 mg/dL   GFR calc non Af Amer >60 >60 mL/min   GFR calc Af Amer >60 >60 mL/min    Comment: (NOTE) The eGFR has been calculated using  the CKD EPI equation. This calculation has not been validated in all clinical situations. eGFR's persistently <60 mL/min signify possible Chronic Kidney Disease.    Anion gap 6 5 - 15  Influenza panel by PCR (type A & B, H1N1)     Status: None   Collection Time: 06/01/15 12:34 AM  Result Value Ref Range   Influenza A By PCR NEGATIVE NEGATIVE   Influenza B By PCR NEGATIVE NEGATIVE   H1N1 flu by pcr NOT DETECTED NOT DETECTED    Comment:        The Xpert Flu assay (FDA approved for nasal aspirates or washes and nasopharyngeal swab specimens), is intended as an aid in the diagnosis of influenza and should not be used as a sole basis for treatment.   Glucose, capillary     Status: Abnormal   Collection Time: 06/01/15 12:39 AM  Result Value Ref Range   Glucose-Capillary 105 (H) 65 - 99 mg/dL   Comment 1 Notify RN    Comment 2 Document in Chart   Strep pneumoniae urinary antigen     Status: None   Collection Time: 06/01/15  6:16 AM  Result Value Ref Range   Strep Pneumo Urinary Antigen NEGATIVE NEGATIVE    Comment:  Infection due to S. pneumoniae cannot be absolutely ruled out since the antigen present may be below the detection limit of the test. Performed at [facility]   Hemoglobin A1c     Status: Abnormal   Collection Time: 06/01/15  6:18 AM  Result Value Ref Range   Hgb A1c MFr Bld 6.0 (H) 4.8 - 5.6 %    Comment: (NOTE)         Pre-diabetes: 5.7 - 6.4         Diabetes: >6.4         Glycemic control for adults with diabetes: <7.0    Mean Plasma Glucose 126 mg/dL    Comment: (NOTE) Performed At: [facility] [place], [place] [id number] Lindon Romp MD [id number]   Lipid panel     Status: Abnormal   Collection Time: 06/01/15  6:18 AM  Result Value Ref Range   Cholesterol 155 0 - 200 mg/dL   Triglycerides 67 <150 mg/dL   HDL 29 (L) >40 mg/dL   Total CHOL/HDL Ratio 5.3 RATIO   VLDL 13 0 - 40 mg/dL   LDL Cholesterol 113  (H) 0 - 99 mg/dL    Comment:        Total Cholesterol/HDL:CHD Risk Coronary Heart Disease Risk Table                     Men   Women  1/2 Average Risk   3.4   3.3  Average Risk       5.0   4.4  2 X Average Risk   9.6   7.1  3 X Average Risk  23.4   11.0        Use the calculated Patient Ratio above and the CHD Risk Table to determine the patient's CHD Risk.        ATP III CLASSIFICATION (LDL):  <100     mg/dL   Optimal  100-129  mg/dL   Near or Above                    Optimal  130-159  mg/dL   Borderline  160-189  mg/dL   High  >190     mg/dL   Very High   Brain natriuretic peptide     Status: Abnormal   Collection Time: 06/01/15  6:18 AM  Result Value Ref Range   B Natriuretic Peptide 203.0 (H) 0.0 - 100.0 pg/mL  Glucose, capillary     Status: Abnormal   Collection Time: 06/01/15  9:36 AM  Result Value Ref Range   Glucose-Capillary 140 (H) 65 - 99 mg/dL   Comment 1 Notify RN   Glucose, capillary     Status: Abnormal   Collection Time: 06/01/15 11:53 AM  Result Value Ref Range   Glucose-Capillary 130 (H) 65 - 99 mg/dL  Culture, sputum-assessment     Status: None   Collection Time: 06/01/15  2:16 PM  Result Value Ref Range   Specimen Description SPU    Special Requests NONE    Sputum evaluation      THIS SPECIMEN IS ACCEPTABLE FOR SPUTUM CULTURE PERFORMED AT [facility]    Report Status 06/01/2015 FINAL   Culture, respiratory (NON-Expectorated)     Status: None (Preliminary result)   Collection Time: 06/01/15  2:20 PM  Result Value Ref Range   Specimen Description SPUTUM    Special Requests NONE    Gram Stain  RARE WBC PRESENT, PREDOMINANTLY PMN FEW SQUAMOUS EPITHELIAL CELLS PRESENT MODERATE GRAM POSITIVE COCCI IN PAIRS IN CHAINS IN CLUSTERS RARE GRAM NEGATIVE RODS Performed at [facility]    Culture PENDING    Report Status PENDING   Glucose, capillary     Status: Abnormal   Collection Time: 06/01/15  5:16 PM  Result Value Ref Range    Glucose-Capillary 146 (H) 65 - 99 mg/dL   Comment 1 Notify RN    Comment 2 Document in Chart   Glucose, capillary     Status: Abnormal   Collection Time: 06/01/15  8:40 PM  Result Value Ref Range   Glucose-Capillary 202 (H) 65 - 99 mg/dL   Comment 1 Notify RN    Comment 2 Document in Chart   Glucose, capillary     Status: Abnormal   Collection Time: 06/02/15  7:37 AM  Result Value Ref Range   Glucose-Capillary 142 (H) 65 - 99 mg/dL   Comment 1 Notify RN    Comment 2 Document in Chart   Glucose, capillary     Status: Abnormal   Collection Time: 06/02/15 11:47 AM  Result Value Ref Range   Glucose-Capillary 114 (H) 65 - 99 mg/dL   Comment 1 Notify RN    Comment 2 Document in Chart   CBC     Status: Abnormal   Collection Time: 06/02/15  2:07 PM  Result Value Ref Range   WBC 11.7 (H) 4.0 - 10.5 K/uL   RBC 5.44 (H) 3.87 - 5.11 MIL/uL   Hemoglobin 14.8 12.0 - 15.0 g/dL   HCT 43.0 36.0 - 46.0 %   MCV 79.0 78.0 - 100.0 fL   MCH 27.2 26.0 - 34.0 pg   MCHC 34.4 30.0 - 36.0 g/dL   RDW 14.1 11.5 - 15.5 %   Platelets 212 150 - 400 K/uL  Basic metabolic panel     Status: Abnormal   Collection Time: 06/02/15  2:07 PM  Result Value Ref Range   Sodium 139 135 - 145 mmol/L   Potassium 4.4 3.5 - 5.1 mmol/L   Chloride 108 101 - 111 mmol/L   CO2 22 22 - 32 mmol/L   Glucose, Bld 149 (H) 65 - 99 mg/dL   BUN 27 (H) 6 - 20 mg/dL   Creatinine, Ser 0.86 0.44 - 1.00 mg/dL   Calcium 9.0 8.9 - 10.3 mg/dL   GFR calc non Af Amer >60 >60 mL/min   GFR calc Af Amer >60 >60 mL/min    Comment: (NOTE) The eGFR has been calculated using the CKD EPI equation. This calculation has not been validated in all clinical situations. eGFR's persistently <60 mL/min signify possible Chronic Kidney Disease.    Anion gap 9 5 - 15  APTT     Status: None   Collection Time: 06/02/15  2:07 PM  Result Value Ref Range   aPTT 25 24 - 37 seconds  Protime-INR     Status: None   Collection Time: 06/02/15  2:07 PM    Result Value Ref Range   Prothrombin Time 14.2 11.6 - 15.2 seconds   INR 1.08 0.00 - 1.49  Troponin I     Status: Abnormal   Collection Time: 06/02/15  2:07 PM  Result Value Ref Range   Troponin I 0.71 (HH) <0.031 ng/mL    Comment:        POSSIBLE MYOCARDIAL ISCHEMIA. SERIAL TESTING RECOMMENDED. CRITICAL RESULT CALLED TO, READ BACK BY AND VERIFIED WITH: HOLBROOK,B ON  06/02/15 AT 1510 BY LOY,C   Glucose, capillary     Status: Abnormal   Collection Time: 06/02/15  2:09 PM  Result Value Ref Range   Glucose-Capillary 181 (H) 65 - 99 mg/dL   Comment 1 Notify RN    Comment 2 Document in Chart   Glucose, capillary     Status: Abnormal   Collection Time: 06/02/15  5:11 PM  Result Value Ref Range   Glucose-Capillary 165 (H) 65 - 99 mg/dL   Comment 1 Notify RN    Comment 2 Document in Chart     Lipid Panel  Recent Labs  06/01/15 0618  CHOL 155  TRIG 67  HDL 29*  CHOLHDL 5.3  VLDL 13  LDLCALC 113*    Studies/Results: The patient had CT scan is reviewed in person. 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 Nothing acute is seen. There is also moderate global atrophy. No hemorrhages.   Medications:  Scheduled Meds: . aspirin  300 mg Rectal Daily   Or  . aspirin  325 mg Oral Daily  . azithromycin  250 mg Oral Daily  . dextromethorphan-guaiFENesin  1 tablet Oral BID  . fluticasone  1 spray Each Nare Daily  . heparin  5,000 Units Subcutaneous 3 times per day  . insulin aspart  0-9 Units Subcutaneous TID WC  . ipratropium-albuterol  3 mL Nebulization QID  . methylPREDNISolone (SOLU-MEDROL) injection  60 mg Intravenous Daily  . naproxen  500 mg Oral BID  . pravastatin  40 mg Oral QPM  . traZODone  50 mg Oral QHS  . varenicline  1 mg Oral BID WC   Continuous Infusions: . sodium chloride 75 mL/hr at 06/01/15 0011  . sodium chloride 100 mL/hr at 06/02/15 1604   PRN Meds:.albuterol, HYDROcodone-acetaminophen, senna-docusate    Critical  care time 16mn  LOS: 2 days   Samaia Iwata A. DMerlene Laughter M.D.  Diplomate, ATax adviserof Psychiatry and Neurology ( Neurology).

## 2015-06-02 NOTE — Progress Notes (Addendum)
Notified by nurse tech, Tara Park, that pt is c/o of left sided weakness and " not feeling right". Pt noted to have left facial droop, slightly garbled speech. BP 170/11, [id number]. Code Stroke initiated at this time and stroke protocols started. Dr. Janna ArchonDiego and Dr. Gerilyn Pilgrimoonquah notified.

## 2015-06-02 NOTE — Progress Notes (Signed)
Appreciate neurology expertise currently on aspirin 325 clinically improving no respiratory distress hemodynamically stable Tara Park [name] DOB: 1946/02/05 DOA: 05/31/2015 PCP: Isabella Stalling, MD             Physical Exam: Blood pressure 98/55, pulse 88, temperature 97.5 F (36.4 C), temperature source Oral, resp. rate 20, height  (1.626 m), weight 139 lb 1.8 oz (63.1 kg), SpO2 97 %. lungs show prolonged respiratory phase scattered rhonchi no rales no wheezes appreciable heart regular rhythm no murmurs gallops measles rubs abdomen soft nontender bowel sounds normoactive neurologic exam no focal neurologic deficits 3 through 12 sensorimotor 5 out of 5 all from his plans are downgoing   Investigations:  Recent Results (from the past 240 hour(s))  Culture, sputum-assessment     Status: None   Collection Time: 06/01/15  2:16 PM  Result Value Ref Range Status   Specimen Description SPU  Final   Special Requests NONE  Final   Sputum evaluation   Final    THIS SPECIMEN IS ACCEPTABLE FOR SPUTUM CULTURE PERFORMED AT [facility]    Report Status 06/01/2015 FINAL  Final     Basic Metabolic Panel:  Recent Labs  95/62/13 2026 05/31/15 2045  NA 139 140  K 7.2* 4.4  CL 107 108  CO2  --  26  GLUCOSE 95 101*  BUN 30* 20  CREATININE 0.70 0.77  CALCIUM  --  8.8*   Liver Function Tests:  Recent Labs  05/31/15 2045  AST 24  ALT 23  ALKPHOS 126  BILITOT 0.4  PROT 6.9  ALBUMIN 3.6     CBC:  Recent Labs  05/31/15 2020 05/31/15 2026  WBC 7.4  --   NEUTROABS 5.0  --   HGB 16.1* 17.3*  HCT 46.2* 51.0*  MCV 79.8  --   PLT 201  --     Dg Chest 2 View  05/31/2015  CLINICAL DATA:  Sudden onset of left-sided weakness, shortness of breath and cough tonight. EXAM: CHEST  2 VIEW COMPARISON:  05/12/2013. FINDINGS: The cardiac silhouette, mediastinal and hilar contours are within normal limits and stable. There is tortuosity of the thoracic aorta. There are  chronic emphysematous and bronchitic lung changes but no acute overlying pulmonary process. No definite pleural effusions. The bony thorax appears stable. Numerous remote thoracic and lumbar compression deformities and osteoporosis. IMPRESSION: Chronic emphysematous and bronchitic lung changes but no acute overlying pulmonary process. Electronically Signed   By: Rudie Meyer M.D.   On: 05/31/2015 23:04   Mr Maxine Glenn Head Wo Contrast  06/01/2015  CLINICAL DATA:  Left-sided weakness. Acute infarct in the left centrum semiovale on MRI yesterday. EXAM: MRA HEAD WITHOUT CONTRAST TECHNIQUE: Angiographic images of the Circle of Willis were obtained using MRA technique without intravenous contrast. COMPARISON:  None. FINDINGS: The visualized distal right vertebral artery is patent and dominant without stenosis. The distal left vertebral artery appears hypoplastic. PICA origins are patent. Right AICA and bilateral SCA origins are patent. Basilar artery is patent and slightly small in caliber developmentally without superimposed stenosis. There are prominent posterior communicating arteries bilaterally. The right P1 segment is hypoplastic, and the left P1 segment may be absent. The proximal P2 segments are widely patent. The more distal PCAs are not well evaluated due to diminished flow related signal, and underlying mid to distal P2 stenoses are not excluded. The internal carotid arteries are patent from skullbase to carotid termini. There is bilateral carotid siphon atherosclerotic irregularity with minimal proximal right  cavernous stenosis and mild proximal left supraclinoid ICA stenosis. The M1 segments are patent without stenosis. MCA bifurcations are patent without evidence of major branch vessel occlusion, however there is mild-to-moderate MCA branch vessel irregularity and attenuation bilaterally. Right A1 segment is widely patent. There is a mild proximal left A1 stenosis. No intracranial aneurysm is identified.  IMPRESSION: 1. No evidence of medium or large vessel intracranial occlusion or flow-limiting proximal stenosis. 2. Mild left supraclinoid ICA and proximal left ACA stenoses. 3. Anterior and posterior circulation branch vessel irregularity and attenuation suggestive of atherosclerosis. Electronically Signed   By: Sebastian Ache M.D.   On: 06/01/2015 09:02   Mr Brain Wo Contrast (neuro Protocol)  05/31/2015  CLINICAL DATA:  Initial evaluation for acute onset left-sided weakness. EXAM: MRI HEAD WITHOUT CONTRAST TECHNIQUE: Multiplanar, multiecho pulse sequences of the brain and surrounding structures were obtained without intravenous contrast. COMPARISON:  None. FINDINGS: There is a in 5 mm focus of restricted diffusion involving the deep white matter of the left centrum semi ovale and the mid to posterior left frontal lobe (series 100, image 38), consistent with a small acute ischemic infarct. No associated edema or hemorrhage. No other acute ischemic infarct. Normal intracranial vascular flow voids are maintained. No acute intracranial hemorrhage. Mild age-related cerebral atrophy is present. Patchy and confluent T2/FLAIR hyperintensity within the periventricular and deep white matter both cerebral hemispheres most likely related to chronic small vessel ischemic disease, moderate nature. Similar changes seen within the pons. Remote hemorrhagic lacunar type infarct within the left lentiform nucleus. Additional remote lacunar infarct within the right thalamus. No mass lesion, midline shift, or mass effect. No hydrocephalus. No extra-axial fluid collection. Craniocervical junction within normal limits. Pituitary gland normal. No acute abnormality about the orbits. Sequela prior bilateral lens extraction noted. Minimal layering fluid within the maxillary sinuses bilaterally. Paranasal sinuses and mastoid air cells are otherwise clear. Inner ear structures grossly normal. Bone marrow signal intensity within normal limits.  No scalp soft tissue abnormality. IMPRESSION: 1. 5 mm acute ischemic nonhemorrhagic infarct within the left centrum semi ovale. No significant mass effect. 2. Remote lacunar infarcts involving the left lentiform nucleus and right thalamus. 3. Age-related cerebral atrophy with moderate chronic microvascular ischemic disease. Electronically Signed   By: Rise Mu M.D.   On: 05/31/2015 20:20   US Carotid Bilateral  06/01/2015  CLINICAL DATA:  Left hemispheric cerebral infarction, hypertension, syncope, hyperlipidemia, diabetes and tobacco use. EXAM: BILATERAL CAROTID DUPLEX ULTRASOUND TECHNIQUE: Wallace Cullens scale imaging, color Doppler and duplex ultrasound were performed of bilateral carotid and vertebral arteries in the neck. COMPARISON:  None. FINDINGS: Criteria: Quantification of carotid stenosis is based on velocity parameters that correlate the residual internal carotid diameter with NASCET-based stenosis levels, using the diameter of the distal internal carotid lumen as the denominator for stenosis measurement. The following velocity measurements were obtained: RIGHT ICA:  102/24 cm/sec CCA:  81/15 cm/sec SYSTOLIC ICA/CCA RATIO:  1.3 DIASTOLIC ICA/CCA RATIO:  1.6 ECA:  223 cm/sec LEFT ICA:  75/19 cm/sec CCA:  54/13 cm/sec SYSTOLIC ICA/CCA RATIO:  1.4 DIASTOLIC ICA/CCA RATIO:  1.4 ECA:  187 cm/sec RIGHT CAROTID ARTERY: There is a moderate amount of predominately calcified plaque at the level of the carotid bulb and proximal internal carotid artery. Based on velocities, estimated right ICA stenosis is less than 50%. Based on grayscale images, stenosis is likely closer to the 50% range. RIGHT VERTEBRAL ARTERY: Antegrade flow with normal waveform and velocity. LEFT CAROTID ARTERY: Moderate amount of partially calcified  plaque is present at the level of the left carotid bulb and proximal internal carotid artery. Velocities correspond to an estimated less than 50% left ICA stenosis. LEFT VERTEBRAL ARTERY:  Antegrade flow with normal waveform and velocity. IMPRESSION: Moderate calcified plaque at the level of both carotid bulbs and proximal internal carotid arteries. Plaque burden slightly more prominent on the right compared to the left by ultrasound. Bilateral ICA stenoses are estimated at less than 50% based on velocity criteria. Based on grayscale imaging, the right ICA stenosis is likely closer to the 50% range. Electronically Signed   By: Irish LackGlenn  Yamagata M.D.   On: 06/01/2015 09:11      Medications:   Impression:  Principal Problem:   Acute ischemic stroke (HCC) Active Problems:   Tobacco abuse   Diabetes mellitus without complication (HCC)   Hypertension   COPD exacerbation (HCC)     Plan: Aspirin 325 by mouth daily physical therapy strengthening and ambulation into new aggressive pulmonary toilet decrease IV steroids continue nebulizer therapy hold NSAIDs  Consultants: Neurology   Procedure   Antibiotics:                   Code Status:  Family Communication:  Spoke with son and patient  Disposition Plan see plan above  Time spent: 30 minutes   LOS: 2 days   Tara Park M   06/02/2015, 12:23 PM

## 2015-06-03 LAB — GLUCOSE, CAPILLARY
GLUCOSE-CAPILLARY: 147 mg/dL — AB (ref 65–99)
Glucose-Capillary: 130 mg/dL — ABNORMAL HIGH (ref 65–99)
Glucose-Capillary: 144 mg/dL — ABNORMAL HIGH (ref 65–99)

## 2015-06-03 LAB — BASIC METABOLIC PANEL
Anion gap: 10 (ref 5–15)
BUN: 27 mg/dL — AB (ref 6–20)
CHLORIDE: 102 mmol/L (ref 101–111)
CO2: 27 mmol/L (ref 22–32)
Calcium: 9.2 mg/dL (ref 8.9–10.3)
Creatinine, Ser: 0.75 mg/dL (ref 0.44–1.00)
GFR calc non Af Amer: >60 mL/min (ref >60)
Glucose, Bld: 150 mg/dL — ABNORMAL HIGH (ref 65–99)
Potassium: 3.7 mmol/L (ref 3.5–5.1)
SODIUM: 139 mmol/L (ref 135–145)

## 2015-06-03 MED ORDER — FUROSEMIDE 20 MG PO TABS
20.0000 mg | ORAL_TABLET | Freq: Every day | ORAL | Status: DC
  Administered 2015-06-03 – 2015-06-10 (×8): 20 mg via ORAL
  Filled 2015-06-03 (×8): qty 1

## 2015-06-03 MED ORDER — ISOSORBIDE MONONITRATE ER 60 MG PO TB24
30.0000 mg | ORAL_TABLET | Freq: Every day | ORAL | Status: DC
  Administered 2015-06-03 – 2015-06-10 (×8): 30 mg via ORAL
  Filled 2015-06-03 (×8): qty 1

## 2015-06-03 MED ORDER — LISINOPRIL 10 MG PO TABS
10.0000 mg | ORAL_TABLET | Freq: Every day | ORAL | Status: DC
  Administered 2015-06-03 – 2015-06-10 (×8): 10 mg via ORAL
  Filled 2015-06-03 (×8): qty 1

## 2015-06-03 MED ORDER — DIGOXIN 125 MCG PO TABS
0.1250 mg | ORAL_TABLET | Freq: Every day | ORAL | Status: DC
  Administered 2015-06-03 – 2015-06-10 (×8): 0.125 mg via ORAL
  Filled 2015-06-03 (×8): qty 1

## 2015-06-03 NOTE — Progress Notes (Signed)
2-D echo reveals a saturation between 25 and 30% consistent with ischemic cardiopathy multiple segmental regional wall motion abnormalities aortic sclerosis without overt stenosis patient continues to wheeze we'll continue to treat COPD aggressively with concomitant anti-CHF therapy Tara Park NWG:[id number] DOB: 02-Aug-1945 DOA: 05/31/2015 PCP: Isabella Stalling, MD             Physical Exam: Blood pressure 125/84, pulse 100, temperature 98 F (36.7 C), temperature source Oral, resp. rate 18, height 5\' 4"  (1.626 m), weight 139 lb 1.8 oz (63.1 kg), SpO2 94 %. No JVD carotid arteries English no carotid bruits no thyromegaly lungs show prolonged in moderate respiratory respiratory wheezes scattered rhonchi diminished breath sounds in bases no overt rales audible heart regular rhythm no S3-S4 no heaves thrills rubs appreciable extremities 1+ chronic pedal edema   Investigations:  Recent Results (from the past 240 hour(s))  Culture, blood (routine x 2) Call MD if unable to obtain prior to antibiotics being given     Status: None (Preliminary result)   Collection Time: 05/31/15 10:51 PM  Result Value Ref Range Status   Specimen Description BLOOD LEFT ARM  Final   Special Requests BOTTLES DRAWN AEROBIC AND ANAEROBIC 6CC  Final   Culture NO GROWTH 3 DAYS  Final   Report Status PENDING  Incomplete  Culture, blood (routine x 2) Call MD if unable to obtain prior to antibiotics being given     Status: None (Preliminary result)   Collection Time: 05/31/15 10:55 PM  Result Value Ref Range Status   Specimen Description RIGHT ANTECUBITAL  Final   Special Requests BOTTLES DRAWN AEROBIC ONLY 6CC  Final   Culture NO GROWTH 3 DAYS  Final   Report Status PENDING  Incomplete  Culture, sputum-assessment     Status: None   Collection Time: 06/01/15  2:16 PM  Result Value Ref Range Status   Specimen Description SPU  Final   Special Requests NONE  Final   Sputum evaluation   Final    THIS  SPECIMEN IS ACCEPTABLE FOR SPUTUM CULTURE PERFORMED AT [facility]    Report Status 06/01/2015 FINAL  Final  Culture, respiratory (NON-Expectorated)     Status: None (Preliminary result)   Collection Time: 06/01/15  2:20 PM  Result Value Ref Range Status   Specimen Description SPUTUM  Final   Special Requests NONE  Final   Gram Stain   Final    RARE WBC PRESENT, PREDOMINANTLY PMN FEW SQUAMOUS EPITHELIAL CELLS PRESENT MODERATE GRAM POSITIVE COCCI IN PAIRS IN CHAINS IN CLUSTERS RARE GRAM NEGATIVE RODS Performed at [facility]    Culture   Final    NORMAL OROPHARYNGEAL FLORA Performed at [facility]    Report Status PENDING  Incomplete     Basic Metabolic Panel:  Recent Labs  57/84/69 1407 06/03/15 0624  NA 139 139  K 4.4 3.7  CL 108 102  CO2 22 27  GLUCOSE 149* 150*  BUN 27* 27*  CREATININE 0.86 0.75  CALCIUM 9.0 9.2   Liver Function Tests:  Recent Labs  05/31/15 2045  AST 24  ALT 23  ALKPHOS 126  BILITOT 0.4  PROT 6.9  ALBUMIN 3.6     CBC:  Recent Labs  05/31/15 2020 05/31/15 2026 06/02/15 1407  WBC 7.4  --  11.7*  NEUTROABS 5.0  --   --   HGB 16.1* 17.3* 14.8  HCT 46.2* 51.0* 43.0  MCV 79.8  --  79.0  PLT 201  --  212    Ct Angio Head W/cm &/or Wo Cm  06/02/2015  CLINICAL DATA:  69 year old female with left side weakness found to have left MCA white matter lacunar infarct on MRI. Initial encounter. EXAM: CT ANGIOGRAPHY HEAD AND NECK TECHNIQUE: Multidetector CT imaging of the head and neck was performed using the standard protocol during bolus administration of intravenous contrast. Multiplanar CT image reconstructions and MIPs were obtained to evaluate the vascular anatomy. Carotid stenosis measurements (when applicable) are obtained utilizing NASCET criteria, using the distal internal carotid diameter as the denominator. CONTRAST:  75mL OMNIPAQUE IOHEXOL 350 MG/ML SOLN COMPARISON:  Head CT without contrast 1357 hours today. Brain MRI  05/31/2015. Intracranial MRA 06/01/2015. FINDINGS: CTA NECK Skeleton: No acute osseous abnormality identified. Degenerative changes and osteopenia in the spine. Absent dentition. Paranasal sinuses and mastoids are clear. Other neck: Moderate to severe pulmonary emphysema. No superior mediastinal lymphadenopathy. Thyroid, larynx, pharynx, parapharyngeal spaces, retropharyngeal space, sublingual space, submandibular glands, and parotid glands are within normal limits. Postoperative changes to the globes. Negative scalp soft tissues. No cervical lymphadenopathy. Aortic arch: 4 vessel arch configuration, the left vertebral artery arises directly from the arch (series 9, image 21). Moderate mostly calcified arch atherosclerosis. No great vessel origin stenosis. Right carotid system: Partially retropharyngeal course of the right CCA. No right CCA stenosis. At the right carotid bifurcation there is soft and calcified plaque resulting in 60 % stenosis with respect to the distal vessel at the right ICA origin and lesser stenosis in the bulb. Distal to the bulb the cervical right ICA is negative. Left carotid system: Negative left CCA. At the left carotid bifurcation there is mostly calcified atherosclerosis with no definite hemodynamically significant proximal left ICA stenosis. There is also mild calcified plaque distal to the bulb just below the skullbase, but no cervical left ICA stenosis. Vertebral arteries:No proximal right subclavian artery stenosis despite calcified plaque. Dense calcified plaque at the right vertebral artery origin with moderate to severe associated stenosis. Tortuous right V1 segment. The right vertebral artery is dominant. There is occasional V2 segment calcified plaque. No other vertebral artery stenosis in the neck. The will left vertebral artery is non dominant, diminutive, and arises directly from the arch. No left vertebral artery stenosis identified. CTA HEAD Posterior circulation: Dominant  distal right vertebral artery with V4 segment calcified plaque resulting in moderate to severe stenosis best seen on series 605, image 105. The non dominant left vertebral artery is diminutive and functionally terminates in PICA. The basilar artery is diminutive and mildly tortuous. There is mild proximal third basilar artery stenosis. The SCA origins are normal. Fetal type bilateral PCA origins. Right PCA branches are within normal limits. There is left P2 segment moderate stenosis with preserved distal enhancement. Anterior circulation: Densely calcified bilateral cavernous and supra clinoid ICA siphon segments. Moderate bilateral supraclinoid segment stenosis seen on the recent MRA comparison as anterior genu and proximal supr clinoid signal loss bilaterally which was probably suspected due to artifact on that study. Ophthalmic artery and posterior communicating artery origins are normal. Both carotid termini remain patent. MCA origins are normal. Moderate left ACA origin stenosis as seen on the MRA. Normal right ACA origin. Diminutive or absent anterior communicating artery. Bilateral ACA branches are within normal limits. Right MCA M1 segment, bifurcation, and right MCA branches are within normal limits. Left MCA M1 segment, bifurcation, and left MCA branches are within normal limits. Venous sinuses: Patent. Anatomic variants: Non dominant left vertebral artery that arises directly from the  arch. Fetal type bilateral PCA origins. Delayed phase: No midline shift, mass effect, or evidence of intracranial mass lesion. Stable gray-white matter differentiation with Patchy and confluent bilateral white matter hypodensity. Chronic left basal ganglia lacunar infarcts. No acute intracranial hemorrhage identified. No abnormal enhancement identified. IMPRESSION: 1. Bilateral cervical carotid atherosclerosis greater on the right. 60% stenosis of the right ICA origin. 2. Extensive calcified plaque in both ICA siphons with  moderate bilateral supraclinoid segment stenosis. 3. Calcified plaque causes moderate to severe stenosis at the origin of the dominant right vertebral artery. Non dominant left vertebral artery arises directly from the arch and functionally terminates in PICA. 4. Stable circle of Willis branches from the recent MRA including moderate left PCA stenosis. 5. Stable CT appearance of the brain from earlier today. 6. Pulmonary emphysema. Electronically Signed   By: Odessa Fleming M.D.   On: 06/02/2015 18:24   Ct Head Wo Contrast  06/02/2015  ADDENDUM REPORT: 06/02/2015 14:44 ADDENDUM: These results were called by telephone at the time of interpretation on 06/02/2015 at 2:44 pm to Dr. Gerilyn Pilgrim, who verbally acknowledged these results. Electronically Signed   By: Delbert Phenix M.D.   On: 06/02/2015 14:44  06/02/2015  CLINICAL DATA:  CVA.  Cough.  Altered mental status. EXAM: CT HEAD WITHOUT CONTRAST TECHNIQUE: Contiguous axial images were obtained from the base of the skull through the vertex without intravenous contrast. COMPARISON:  05/31/2015 MRI brain. FINDINGS: No evidence of parenchymal hemorrhage or extra-axial fluid collection. No mass lesion, mass effect, or midline shift. There is patchy hypodensity in the left deep white matter at the site of the recently described left centrum semiovale ischemic infarct on the brain MRI from 2 days prior. There is additional prominent patchy periventricular and deep white matter hypodensity bilaterally. There is intracranial atherosclerosis. Old lacunar infarcts are noted in the right thalamus and left basal ganglia. Cerebral volume is age appropriate. No ventriculomegaly. The visualized paranasal sinuses are essentially clear. The mastoid air cells are unopacified. No evidence of calvarial fracture. IMPRESSION: 1. Patchy hypodensity in the left deep white matter at the site of the recently described left centrum semiovale ischemic infarct on the brain MRI from 2 days prior, in  keeping with a subacute left frontal white matter infarct. No acute intracranial hemorrhage, mass effect or midline shift. 2. Intracranial atherosclerosis, old right thalamic and left basal ganglia lacunar infarcts and prominent chronic small vessel ischemic white matter change. These results were called by telephone at the time of interpretation on 06/02/2015 at 2:13 pm to Dr. Oval Linsey , who verbally acknowledged these results. Electronically Signed: By: Delbert Phenix M.D. On: 06/02/2015 14:19   Ct Angio Neck W/cm &/or Wo/cm  06/02/2015  CLINICAL DATA:  69 year old female with left side weakness found to have left MCA white matter lacunar infarct on MRI. Initial encounter. EXAM: CT ANGIOGRAPHY HEAD AND NECK TECHNIQUE: Multidetector CT imaging of the head and neck was performed using the standard protocol during bolus administration of intravenous contrast. Multiplanar CT image reconstructions and MIPs were obtained to evaluate the vascular anatomy. Carotid stenosis measurements (when applicable) are obtained utilizing NASCET criteria, using the distal internal carotid diameter as the denominator. CONTRAST:  75mL OMNIPAQUE IOHEXOL 350 MG/ML SOLN COMPARISON:  Head CT without contrast 1357 hours today. Brain MRI 05/31/2015. Intracranial MRA 06/01/2015. FINDINGS: CTA NECK Skeleton: No acute osseous abnormality identified. Degenerative changes and osteopenia in the spine. Absent dentition. Paranasal sinuses and mastoids are clear. Other neck: Moderate to severe pulmonary emphysema.  No superior mediastinal lymphadenopathy. Thyroid, larynx, pharynx, parapharyngeal spaces, retropharyngeal space, sublingual space, submandibular glands, and parotid glands are within normal limits. Postoperative changes to the globes. Negative scalp soft tissues. No cervical lymphadenopathy. Aortic arch: 4 vessel arch configuration, the left vertebral artery arises directly from the arch (series 9, image 21). Moderate mostly  calcified arch atherosclerosis. No great vessel origin stenosis. Right carotid system: Partially retropharyngeal course of the right CCA. No right CCA stenosis. At the right carotid bifurcation there is soft and calcified plaque resulting in 60 % stenosis with respect to the distal vessel at the right ICA origin and lesser stenosis in the bulb. Distal to the bulb the cervical right ICA is negative. Left carotid system: Negative left CCA. At the left carotid bifurcation there is mostly calcified atherosclerosis with no definite hemodynamically significant proximal left ICA stenosis. There is also mild calcified plaque distal to the bulb just below the skullbase, but no cervical left ICA stenosis. Vertebral arteries:No proximal right subclavian artery stenosis despite calcified plaque. Dense calcified plaque at the right vertebral artery origin with moderate to severe associated stenosis. Tortuous right V1 segment. The right vertebral artery is dominant. There is occasional V2 segment calcified plaque. No other vertebral artery stenosis in the neck. The will left vertebral artery is non dominant, diminutive, and arises directly from the arch. No left vertebral artery stenosis identified. CTA HEAD Posterior circulation: Dominant distal right vertebral artery with V4 segment calcified plaque resulting in moderate to severe stenosis best seen on series 605, image 105. The non dominant left vertebral artery is diminutive and functionally terminates in PICA. The basilar artery is diminutive and mildly tortuous. There is mild proximal third basilar artery stenosis. The SCA origins are normal. Fetal type bilateral PCA origins. Right PCA branches are within normal limits. There is left P2 segment moderate stenosis with preserved distal enhancement. Anterior circulation: Densely calcified bilateral cavernous and supra clinoid ICA siphon segments. Moderate bilateral supraclinoid segment stenosis seen on the recent MRA  comparison as anterior genu and proximal supr clinoid signal loss bilaterally which was probably suspected due to artifact on that study. Ophthalmic artery and posterior communicating artery origins are normal. Both carotid termini remain patent. MCA origins are normal. Moderate left ACA origin stenosis as seen on the MRA. Normal right ACA origin. Diminutive or absent anterior communicating artery. Bilateral ACA branches are within normal limits. Right MCA M1 segment, bifurcation, and right MCA branches are within normal limits. Left MCA M1 segment, bifurcation, and left MCA branches are within normal limits. Venous sinuses: Patent. Anatomic variants: Non dominant left vertebral artery that arises directly from the arch. Fetal type bilateral PCA origins. Delayed phase: No midline shift, mass effect, or evidence of intracranial mass lesion. Stable gray-white matter differentiation with Patchy and confluent bilateral white matter hypodensity. Chronic left basal ganglia lacunar infarcts. No acute intracranial hemorrhage identified. No abnormal enhancement identified. IMPRESSION: 1. Bilateral cervical carotid atherosclerosis greater on the right. 60% stenosis of the right ICA origin. 2. Extensive calcified plaque in both ICA siphons with moderate bilateral supraclinoid segment stenosis. 3. Calcified plaque causes moderate to severe stenosis at the origin of the dominant right vertebral artery. Non dominant left vertebral artery arises directly from the arch and functionally terminates in PICA. 4. Stable circle of Willis branches from the recent MRA including moderate left PCA stenosis. 5. Stable CT appearance of the brain from earlier today. 6. Pulmonary emphysema. Electronically Signed   By: Odessa FlemingH  Hall M.D.   On:  06/02/2015 18:24      Medications:   Impression: Ischemic cardiomyopathy  Principal Problem:   Acute ischemic stroke [facility]) Active Problems:   Tobacco abuse   Diabetes mellitus without complication  (HCC)   Hypertension   COPD exacerbation (HCC)     Plan: We'll add ACE inhibitor lisinopril 10 mg by mouth daily in addition Imdur 30 mg by mouth daily digoxin 0.125 daily we'll continue aggressive nebulizer therapy every 4 hours continue Solu-Medrol will add Lasix 20 mg by mouth daily monitor electrolytes patient is not a candidate for cardiac cath at present due to her fluctuating neurologic symptomatology  Consultants: Neurology   Procedures   Antibiotics:                   Code Status: Full  Family Communication:  Spoke with patient at length concerning guarded prognosis  Disposition Plan see plan above  Time spent: 40 minutes  LOS: 3 days   Bishoy Cupp M   06/03/2015, 11:02 AM

## 2015-06-03 NOTE — Evaluation (Signed)
Clinical/Bedside Swallow Evaluation Patient Details  Name: Tara GuyJane F Wiens MRN: [id number] Date of Birth: April 23, 1946  Today's Date: 06/03/2015 Time:        Past Medical History:  Past Medical History  Diagnosis Date  . Diabetes mellitus without complication (HCC)   . Hypertension   . Tobacco abuse   . Use of cane as ambulatory aid   . Leg edema, right     chronic   Past Surgical History:  Past Surgical History  Procedure Laterality Date  . Cataract extraction    . Hip surgery    . Foot surgery     HPI: Pt is a 69 y.o. female with PMH of COPD, hypertension, hyperlipidemia, diabetes mellitus, tobacco abuse, COPD, who presented with left-sided weakness. 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 Pt was initially evaluated by SLP on11-9-16 subsequentely without associated swallow deficits. Since then pt with recurrent onset of stroke symptoms and this date exhibits notable left sided weakness, dysarthria, and dysphagia. ST to further assess current swallow function.       Assessment / Plan / Recommendation Clinical Impression   Pt presents with moderate oropharygeal dysphagia. This being an acute change since initial hospitalization. Pt and RN report recurrence of stroke like symptoms on 06-02-15 including  slurred speech and dysphagia which have not resolved. Oral motor exam significant for suspected involvement of CN VII and CN XII. Noted left sided facial droop, left sided lingual weakness, and left sided lingual decreased range of motion. Pt with consistent signs and symptoms of inadequate airway protection with ice chips, thin liquids, and nectar thick liquids characterized by immediate coughing spells. Note history of COPD and baseline decreased respiratory support further increasing severity of aspiration risk. No signs or symptoms of aspiration with honey thick liquids. Pt with prolonged mastication of solid PO,  anticipate potential left sided pocketting given lingual and buccal weakness. Recommend dysphagia 3 (mechanical soft) and honey thick liquids. Educated pt extensively importance of compliance with diet recommnedations. ST to follow up.        Aspiration Risk    moderate   Diet Recommendation   honey thick, dysphagia 3 (mechanical soft)        Other  Recommendations   BID  Follow up Recommendations       Frequency and Duration  2x a week for 1 week          Swallow Study   General      Oral/Motor/Sensory Function     Ice Chips     Thin Liquid      Nectar Thick     Honey Thick     Puree     Solid        Marcene Duoshelsea Sumney MA, CCC-SLP Acute Care Speech Language Pathologist    Marcene DuosSumney, Shyloh Krinke E 06/03/2015,5:30 PM

## 2015-06-03 NOTE — Care Management Important Message (Signed)
Important Message  Patient Details  Name: Tara GuyJane F Park MRN: [id number] Date of Birth: 1945/10/27   Medicare Important Message Given:  Yes    Cheryl FlashBlackwell, Wesleigh Markovic Crowder, RN 06/03/2015, 12:25 PM

## 2015-06-03 NOTE — Care Management Note (Signed)
Case Management Note  Patient Details  Name: Tara GuyJane F Carico MRN: [id number] Date of Birth: 10-25-1945  Subjective/Objective:                    Action/Plan:   Expected Discharge Date:                  Expected Discharge Plan:  Home/Self Care  In-House Referral:  NA  Discharge planning Services  CM Consult  Post Acute Care Choice:  NA Choice offered to:  NA  DME Arranged:    DME Agency:     HH Arranged:    HH Agency:     Status of Service:  Completed, signed off  Medicare Important Message Given:  Yes Date Medicare IM Given:    Medicare IM give by:    Date Additional Medicare IM Given:    Additional Medicare Important Message give by:     If discussed at Long Length of Stay Meetings, dates discussed:    Additional Comments: Pt had another Code Stroke on 06/02/15. Pt still has some drooping of the mouth and slurred speech. ST consulted. Pt still on IV steroids and O2. Will need home O2 assessment prior to discharge. Will continue to follow for discharge planning needs. Arlyss QueenBlackwell, Jya Hughston Hillsvillerowder, RN 06/03/2015, 12:27 PM

## 2015-06-03 NOTE — Progress Notes (Addendum)
Patient ID: AVNEET ASHMORE, female   DOB: 06/26/1946, 69 y.o.   MRN: [id number]  [place] A. Merlene Laughter, MD     [email or web address]          KEYATTA TOLLES is an 69 y.o. female.   Assessment/Plan:  The patient most likely had a TIA a couple days ago and now has progressed to a completed infarct unfortunately - lacunar type.  Acute left subcortical infarct likely asymptomatic given the location.   Multiple bilateral lacunar infarcts.  Acute lacunar infarction. Risk factors DM, HTN, tobacco use, and age.  RECOMMENDATION: Agree with antiplatelet aspirin therapy. 325 mg daily as recommended. Smoking cessation. Diabetes control. Blood pressure control. Stop Naproxen and NSAIDS - given the known risks to increase thromboembolic event such as stroke and heart attack.       She reports that she has improved. She indicates that her speech has improved. She also indicates that the left upper extremity weakness has improved although not back to normal.  GENERAL: She is acutely dyspneic.  HEENT: Supple. Atraumatic normocephalic.   ABDOMEN: soft  EXTREMITIES: There is marked pitting edema of the right distal leg and ankle with marked limitation of dorsiflexion and the plantarflexion of the ankle.  She had a fracture there and it is fused. Significant arthritic changes of the knees bilaterally.   BACK: Normal.  SKIN: Normal by inspection.   MENTAL STATUS: Alert and oriented. She does have mild dysarthria. There is flattening of the nasolabial fold on the left side.   CRANIAL NERVES: Pupils are equal, round and reactive to light and accommodation; extra ocular movements are full, there is no significant nystagmus; visual fields are full; upper and lower facial muscles are normal in strength and symmetric, there is no flattening of the nasolabial folds; tongue is midline; uvula is midline; shoulder elevation is normal.  MOTOR:Right side shows normal tone, bulk  and strength. Left upper extremity 5/5. Left leg 5; LUE  drift.  COORDINATION: Left finger to nose is normal, right finger to nose is normal, No rest tremor; no intention tremor; no postural tremor; no bradykinesia.  REFLEXES: Deep tendon reflexes are symmetrical and normal. Babinski reflexes are flexor bilaterally.   SENSATION: Normal to light touch. Normal tactile and visual double simultaneous stimulation.  NIH stroke scale 2.     Objective: Vital signs in last 24 hours: Temp:  [97.4 F (36.3 C)-98.4 F (36.9 C)] 98.4 F (36.9 C) (11/11 1746) Pulse Rate:  [100-120] 116 (11/11 1746) Resp:  [18-24] 18 (11/11 1746) BP: (98-169)/(53-111) 159/87 mmHg (11/11 1746) SpO2:  [93 %-98 %] 97 % (11/11 1746)  Intake/Output from previous day: 11/10 0701 - 11/11 0700 In: 720 [P.O.:720] Out: 3000 [Urine:3000] Intake/Output this shift:   Nutritional status: DIET DYS 3 Room service appropriate?: Yes; Fluid consistency:: Honey Thick   Lab Results: Results for orders placed or performed during the hospital encounter of 05/31/15 (from the past 48 hour(s))  Glucose, capillary     Status: Abnormal   Collection Time: 06/01/15  8:40 PM  Result Value Ref Range   Glucose-Capillary 202 (H) 65 - 99 mg/dL   Comment 1 Notify RN    Comment 2 Document in Chart   Glucose, capillary     Status: Abnormal   Collection Time: 06/02/15  7:37 AM  Result Value Ref Range   Glucose-Capillary 142 (H) 65 - 99 mg/dL   Comment 1 Notify RN    Comment 2 Document in Chart   Glucose,  capillary     Status: Abnormal   Collection Time: 06/02/15 11:47 AM  Result Value Ref Range   Glucose-Capillary 114 (H) 65 - 99 mg/dL   Comment 1 Notify RN    Comment 2 Document in Chart   CBC     Status: Abnormal   Collection Time: 06/02/15  2:07 PM  Result Value Ref Range   WBC 11.7 (H) 4.0 - 10.5 K/uL   RBC 5.44 (H) 3.87 - 5.11 MIL/uL   Hemoglobin 14.8 12.0 - 15.0 g/dL   HCT 43.0 36.0 - 46.0 %   MCV 79.0 78.0 - 100.0 fL    MCH 27.2 26.0 - 34.0 pg   MCHC 34.4 30.0 - 36.0 g/dL   RDW 14.1 11.5 - 15.5 %   Platelets 212 150 - 400 K/uL  Basic metabolic panel     Status: Abnormal   Collection Time: 06/02/15  2:07 PM  Result Value Ref Range   Sodium 139 135 - 145 mmol/L   Potassium 4.4 3.5 - 5.1 mmol/L   Chloride 108 101 - 111 mmol/L   CO2 22 22 - 32 mmol/L   Glucose, Bld 149 (H) 65 - 99 mg/dL   BUN 27 (H) 6 - 20 mg/dL   Creatinine, Ser 0.86 0.44 - 1.00 mg/dL   Calcium 9.0 8.9 - 10.3 mg/dL   GFR calc non Af Amer >60 >60 mL/min   GFR calc Af Amer >60 >60 mL/min    Comment: (NOTE) The eGFR has been calculated using the CKD EPI equation. This calculation has not been validated in all clinical situations. eGFR's persistently <60 mL/min signify possible Chronic Kidney Disease.    Anion gap 9 5 - 15  APTT     Status: None   Collection Time: 06/02/15  2:07 PM  Result Value Ref Range   aPTT 25 24 - 37 seconds  Protime-INR     Status: None   Collection Time: 06/02/15  2:07 PM  Result Value Ref Range   Prothrombin Time 14.2 11.6 - 15.2 seconds   INR 1.08 0.00 - 1.49  Troponin I     Status: Abnormal   Collection Time: 06/02/15  2:07 PM  Result Value Ref Range   Troponin I 0.71 (HH) <0.031 ng/mL    Comment:        POSSIBLE MYOCARDIAL ISCHEMIA. SERIAL TESTING RECOMMENDED. CRITICAL RESULT CALLED TO, READ BACK BY AND VERIFIED WITH: HOLBROOK,B ON 06/02/15 AT 1510 BY LOY,C   Glucose, capillary     Status: Abnormal   Collection Time: 06/02/15  2:09 PM  Result Value Ref Range   Glucose-Capillary 181 (H) 65 - 99 mg/dL   Comment 1 Notify RN    Comment 2 Document in Chart   Glucose, capillary     Status: Abnormal   Collection Time: 06/02/15  5:11 PM  Result Value Ref Range   Glucose-Capillary 165 (H) 65 - 99 mg/dL   Comment 1 Notify RN    Comment 2 Document in Chart   Glucose, capillary     Status: Abnormal   Collection Time: 06/02/15  9:45 PM  Result Value Ref Range   Glucose-Capillary 153 (H) 65 - 99  mg/dL  Basic metabolic panel     Status: Abnormal   Collection Time: 06/03/15  6:24 AM  Result Value Ref Range   Sodium 139 135 - 145 mmol/L   Potassium 3.7 3.5 - 5.1 mmol/L   Chloride 102 101 - 111 mmol/L   CO2 27 22 -  32 mmol/L   Glucose, Bld 150 (H) 65 - 99 mg/dL   BUN 27 (H) 6 - 20 mg/dL   Creatinine, Ser 0.75 0.44 - 1.00 mg/dL   Calcium 9.2 8.9 - 10.3 mg/dL   GFR calc non Af Amer >60 >60 mL/min   GFR calc Af Amer >60 >60 mL/min    Comment: (NOTE) The eGFR has been calculated using the CKD EPI equation. This calculation has not been validated in all clinical situations. eGFR's persistently <60 mL/min signify possible Chronic Kidney Disease.    Anion gap 10 5 - 15  Glucose, capillary     Status: Abnormal   Collection Time: 06/03/15  7:32 AM  Result Value Ref Range   Glucose-Capillary 130 (H) 65 - 99 mg/dL  Glucose, capillary     Status: Abnormal   Collection Time: 06/03/15 11:24 AM  Result Value Ref Range   Glucose-Capillary 144 (H) 65 - 99 mg/dL  Glucose, capillary     Status: Abnormal   Collection Time: 06/03/15  4:10 PM  Result Value Ref Range   Glucose-Capillary 147 (H) 65 - 99 mg/dL    Lipid Panel  Recent Labs  06/01/15 0618  CHOL 155  TRIG 67  HDL 29*  CHOLHDL 5.3  VLDL 13  LDLCALC 113*    Studies/Results: The patient had CT scan is reviewed in person. 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 Nothing acute is seen. There is also moderate global atrophy. No hemorrhages.   Medications:  Scheduled Meds: . aspirin  300 mg Rectal Daily   Or  . aspirin  325 mg Oral Daily  . azithromycin  250 mg Oral Daily  . dextromethorphan-guaiFENesin  1 tablet Oral BID  . digoxin  0.125 mg Oral Daily  . furosemide  20 mg Oral Daily  . heparin  5,000 Units Subcutaneous 3 times per day  . insulin aspart  0-9 Units Subcutaneous TID WC  . ipratropium-albuterol  3 mL Nebulization Q4H  . isosorbide mononitrate  30 mg Oral Daily  .  lisinopril  10 mg Oral Daily  . methylPREDNISolone (SOLU-MEDROL) injection  125 mg Intravenous 3 times per day  . naproxen  500 mg Oral BID  . pravastatin  40 mg Oral QPM  . traZODone  50 mg Oral QHS  . varenicline  1 mg Oral BID WC   Continuous Infusions: . sodium chloride 75 mL/hr at 06/03/15 0549   PRN Meds:.albuterol, HYDROcodone-acetaminophen, LORazepam, senna-docusate    Critical care time 5mn  LOS: 3 days   Alexiz Sustaita A. DMerlene Laughter M.D.  Diplomate, ATax adviserof Psychiatry and Neurology ( Neurology).

## 2015-06-04 ENCOUNTER — Inpatient Hospital Stay (HOSPITAL_COMMUNITY): Payer: Medicare Other

## 2015-06-04 LAB — BASIC METABOLIC PANEL
ANION GAP: 9 (ref 5–15)
BUN: 35 mg/dL — ABNORMAL HIGH (ref 6–20)
CO2: 23 mmol/L (ref 22–32)
Calcium: 8.9 mg/dL (ref 8.9–10.3)
Chloride: 106 mmol/L (ref 101–111)
Creatinine, Ser: 0.74 mg/dL (ref 0.44–1.00)
Glucose, Bld: 151 mg/dL — ABNORMAL HIGH (ref 65–99)
POTASSIUM: 3.8 mmol/L (ref 3.5–5.1)
SODIUM: 138 mmol/L (ref 135–145)

## 2015-06-04 LAB — GLUCOSE, CAPILLARY
GLUCOSE-CAPILLARY: 138 mg/dL — AB (ref 65–99)
GLUCOSE-CAPILLARY: 127 mg/dL — AB (ref 65–99)
GLUCOSE-CAPILLARY: 172 mg/dL — AB (ref 65–99)
Glucose-Capillary: 139 mg/dL — ABNORMAL HIGH (ref 65–99)
Glucose-Capillary: 133 mg/dL — ABNORMAL HIGH (ref 65–99)
Glucose-Capillary: 135 mg/dL — ABNORMAL HIGH (ref 65–99)

## 2015-06-04 LAB — CULTURE, RESPIRATORY: CULTURE: NORMAL

## 2015-06-04 LAB — CULTURE, RESPIRATORY W GRAM STAIN

## 2015-06-04 MED ORDER — IPRATROPIUM-ALBUTEROL 0.5-2.5 (3) MG/3ML IN SOLN
3.0000 mL | RESPIRATORY_TRACT | Status: DC
  Administered 2015-06-04 – 2015-06-10 (×36): 3 mL via RESPIRATORY_TRACT
  Filled 2015-06-04 (×36): qty 3

## 2015-06-04 NOTE — Progress Notes (Signed)
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 rate 20, height 5\' 4"  (1.626 m), weight 139 lb 1.8 oz (63.1 kg), SpO2 96 %. lungs coarse rhonchi bilaterally prolonged mild to moderate intrauterine respiratory wheezes no rales audible but diminished breath sounds in the bases heart regular rhythm no S3 or S4 no heaves rubs   Investigations:  Recent Results (from the past 240 hour(s))  Culture, blood (routine x 2) Call MD if unable to obtain prior to antibiotics being given     Status: None (Preliminary result)   Collection Time: 05/31/15 10:51 PM  Result Value Ref Range Status   Specimen Description BLOOD LEFT ARM  Final   Special Requests BOTTLES DRAWN AEROBIC AND ANAEROBIC 6CC  Final   Culture NO GROWTH 4 DAYS  Final   Report Status PENDING  Incomplete  Culture, blood (routine x 2) Call MD if unable to obtain prior to antibiotics being given     Status: None (Preliminary result)   Collection Time: 05/31/15 10:55 PM  Result Value Ref Range Status   Specimen Description RIGHT ANTECUBITAL  Final   Special Requests BOTTLES DRAWN AEROBIC ONLY 6CC  Final   Culture NO GROWTH 4 DAYS  Final   Report Status PENDING  Incomplete  Culture, sputum-assessment     Status: None   Collection Time: 06/01/15  2:16 PM  Result Value Ref Range Status   Specimen Description SPU  Final   Special Requests NONE  Final   Sputum evaluation   Final    THIS SPECIMEN IS ACCEPTABLE FOR SPUTUM CULTURE PERFORMED AT [facility]    Report Status  06/01/2015 FINAL  Final  Culture, respiratory (NON-Expectorated)     Status: None   Collection Time: 06/01/15  2:20 PM  Result Value Ref Range Status   Specimen Description SPUTUM  Final   Special Requests NONE  Final   Gram Stain   Final    RARE WBC PRESENT, PREDOMINANTLY PMN FEW SQUAMOUS EPITHELIAL CELLS PRESENT MODERATE GRAM POSITIVE COCCI IN PAIRS IN CHAINS IN CLUSTERS RARE GRAM NEGATIVE RODS Performed at [facility]    Culture   Final    NORMAL OROPHARYNGEAL FLORA Performed at [facility]    Report Status 06/04/2015 FINAL  Final     Basic Metabolic Panel:  Recent Labs  81/19/14 0624 06/04/15 0510  NA 139 138  K 3.7 3.8  CL 102 106  CO2 27 23  GLUCOSE 150* 151*  BUN 27* 35*  CREATININE 0.75 0.74  CALCIUM 9.2 8.9   Liver Function Tests: No results for input(s): AST, ALT, ALKPHOS, BILITOT, PROT, ALBUMIN in the last 72 hours.   CBC:  Recent Labs  06/02/15 1407  WBC 11.7*  HGB 14.8  HCT 43.0  MCV 79.0  PLT 212    Ct Angio Head W/cm &/or Wo Cm  06/02/2015  CLINICAL DATA:  69 year old female with left side weakness found to have left MCA white matter lacunar infarct on MRI. Initial encounter. EXAM: CT ANGIOGRAPHY HEAD AND NECK TECHNIQUE: Multidetector CT imaging of the  head and neck was performed using the standard protocol during bolus administration of intravenous contrast. Multiplanar CT image reconstructions and MIPs were obtained to evaluate the vascular anatomy. Carotid stenosis measurements (when applicable) are obtained utilizing NASCET criteria, using the distal internal carotid diameter as the denominator. CONTRAST:  75mL OMNIPAQUE IOHEXOL 350 MG/ML SOLN COMPARISON:  Head CT without contrast 1357 hours today. Brain MRI 05/31/2015. Intracranial MRA 06/01/2015. FINDINGS: CTA NECK Skeleton: No acute osseous abnormality identified. Degenerative changes and osteopenia in the spine. Absent dentition. Paranasal sinuses and mastoids are clear.  Other neck: Moderate to severe pulmonary emphysema. No superior mediastinal lymphadenopathy. Thyroid, larynx, pharynx, parapharyngeal spaces, retropharyngeal space, sublingual space, submandibular glands, and parotid glands are within normal limits. Postoperative changes to the globes. Negative scalp soft tissues. No cervical lymphadenopathy. Aortic arch: 4 vessel arch configuration, the left vertebral artery arises directly from the arch (series 9, image 21). Moderate mostly calcified arch atherosclerosis. No great vessel origin stenosis. Right carotid system: Partially retropharyngeal course of the right CCA. No right CCA stenosis. At the right carotid bifurcation there is soft and calcified plaque resulting in 60 % stenosis with respect to the distal vessel at the right ICA origin and lesser stenosis in the bulb. Distal to the bulb the cervical right ICA is negative. Left carotid system: Negative left CCA. At the left carotid bifurcation there is mostly calcified atherosclerosis with no definite hemodynamically significant proximal left ICA stenosis. There is also mild calcified plaque distal to the bulb just below the skullbase, but no cervical left ICA stenosis. Vertebral arteries:No proximal right subclavian artery stenosis despite calcified plaque. Dense calcified plaque at the right vertebral artery origin with moderate to severe associated stenosis. Tortuous right V1 segment. The right vertebral artery is dominant. There is occasional V2 segment calcified plaque. No other vertebral artery stenosis in the neck. The will left vertebral artery is non dominant, diminutive, and arises directly from the arch. No left vertebral artery stenosis identified. CTA HEAD Posterior circulation: Dominant distal right vertebral artery with V4 segment calcified plaque resulting in moderate to severe stenosis best seen on series 605, image 105. The non dominant left vertebral artery is diminutive and functionally terminates  in PICA. The basilar artery is diminutive and mildly tortuous. There is mild proximal third basilar artery stenosis. The SCA origins are normal. Fetal type bilateral PCA origins. Right PCA branches are within normal limits. There is left P2 segment moderate stenosis with preserved distal enhancement. Anterior circulation: Densely calcified bilateral cavernous and supra clinoid ICA siphon segments. Moderate bilateral supraclinoid segment stenosis seen on the recent MRA comparison as anterior genu and proximal supr clinoid signal loss bilaterally which was probably suspected due to artifact on that study. Ophthalmic artery and posterior communicating artery origins are normal. Both carotid termini remain patent. MCA origins are normal. Moderate left ACA origin stenosis as seen on the MRA. Normal right ACA origin. Diminutive or absent anterior communicating artery. Bilateral ACA branches are within normal limits. Right MCA M1 segment, bifurcation, and right MCA branches are within normal limits. Left MCA M1 segment, bifurcation, and left MCA branches are within normal limits. Venous sinuses: Patent. Anatomic variants: Non dominant left vertebral artery that arises directly from the arch. Fetal type bilateral PCA origins. Delayed phase: No midline shift, mass effect, or evidence of intracranial mass lesion. Stable gray-white matter differentiation with Patchy and confluent bilateral white matter hypodensity. Chronic left basal ganglia lacunar infarcts. No acute intracranial hemorrhage identified. No abnormal enhancement identified. IMPRESSION: 1.  Bilateral cervical carotid atherosclerosis greater on the right. 60% stenosis of the right ICA origin. 2. Extensive calcified plaque in both ICA siphons with moderate bilateral supraclinoid segment stenosis. 3. Calcified plaque causes moderate to severe stenosis at the origin of the dominant right vertebral artery. Non dominant left vertebral artery arises directly from the  arch and functionally terminates in PICA. 4. Stable circle of Willis branches from the recent MRA including moderate left PCA stenosis. 5. Stable CT appearance of the brain from earlier today. 6. Pulmonary emphysema. Electronically Signed   By: Odessa Fleming M.D.   On: 06/02/2015 18:24   Ct Head Wo Contrast  06/04/2015  CLINICAL DATA:  Left arm weakness. EXAM: CT HEAD WITHOUT CONTRAST TECHNIQUE: Contiguous axial images were obtained from the base of the skull through the vertex without intravenous contrast. COMPARISON:  Yesterday FINDINGS: No new or acute osseous or extracranial findings. New ovoid low-density in the right putamen and corona radiata consistent with interval infarct. Left centrum semiovale infarct noted on brain MRI 05/31/2015 is largely obscured by extensive chronic small vessel ischemic gliosis. Remote lacune in the left putamen with flattened cavity. No hemorrhagic conversion. No hydrocephalus or shift. Age related cerebral volume loss. IMPRESSION: 1. Acute, nonhemorrhagic infarct in the right putamen and corona radiata that is new from head CT yesterday. 2. Recently demonstrated left corona radiata infarct is obscured by extensive chronic ischemic gliosis. Electronically Signed   By: Marnee Spring M.D.   On: 06/04/2015 05:08   Ct Head Wo Contrast  06/02/2015  ADDENDUM REPORT: 06/02/2015 14:44 ADDENDUM: These results were called by telephone at the time of interpretation on 06/02/2015 at 2:44 pm to Dr. Gerilyn Pilgrim, who verbally acknowledged these results. Electronically Signed   By: Delbert Phenix M.D.   On: 06/02/2015 14:44  06/02/2015  CLINICAL DATA:  CVA.  Cough.  Altered mental status. EXAM: CT HEAD WITHOUT CONTRAST TECHNIQUE: Contiguous axial images were obtained from the base of the skull through the vertex without intravenous contrast. COMPARISON:  05/31/2015 MRI brain. FINDINGS: No evidence of parenchymal hemorrhage or extra-axial fluid collection. No mass lesion, mass effect, or midline  shift. There is patchy hypodensity in the left deep white matter at the site of the recently described left centrum semiovale ischemic infarct on the brain MRI from 2 days prior. There is additional prominent patchy periventricular and deep white matter hypodensity bilaterally. There is intracranial atherosclerosis. Old lacunar infarcts are noted in the right thalamus and left basal ganglia. Cerebral volume is age appropriate. No ventriculomegaly. The visualized paranasal sinuses are essentially clear. The mastoid air cells are unopacified. No evidence of calvarial fracture. IMPRESSION: 1. Patchy hypodensity in the left deep white matter at the site of the recently described left centrum semiovale ischemic infarct on the brain MRI from 2 days prior, in keeping with a subacute left frontal white matter infarct. No acute intracranial hemorrhage, mass effect or midline shift. 2. Intracranial atherosclerosis, old right thalamic and left basal ganglia lacunar infarcts and prominent chronic small vessel ischemic white matter change. These results were called by telephone at the time of interpretation on 06/02/2015 at 2:13 pm to Dr. Oval Linsey , who verbally acknowledged these results. Electronically Signed: By: Delbert Phenix M.D. On: 06/02/2015 14:19   Ct Angio Neck W/cm &/or Wo/cm  06/02/2015  CLINICAL DATA:  69 year old female with left side weakness found to have left MCA white matter lacunar infarct on MRI. Initial encounter. EXAM: CT ANGIOGRAPHY HEAD AND NECK TECHNIQUE: Multidetector CT imaging  of the head and neck was performed using the standard protocol during bolus administration of intravenous contrast. Multiplanar CT image reconstructions and MIPs were obtained to evaluate the vascular anatomy. Carotid stenosis measurements (when applicable) are obtained utilizing NASCET criteria, using the distal internal carotid diameter as the denominator. CONTRAST:  75mL OMNIPAQUE IOHEXOL 350 MG/ML SOLN COMPARISON:   Head CT without contrast 1357 hours today. Brain MRI 05/31/2015. Intracranial MRA 06/01/2015. FINDINGS: CTA NECK Skeleton: No acute osseous abnormality identified. Degenerative changes and osteopenia in the spine. Absent dentition. Paranasal sinuses and mastoids are clear. Other neck: Moderate to severe pulmonary emphysema. No superior mediastinal lymphadenopathy. Thyroid, larynx, pharynx, parapharyngeal spaces, retropharyngeal space, sublingual space, submandibular glands, and parotid glands are within normal limits. Postoperative changes to the globes. Negative scalp soft tissues. No cervical lymphadenopathy. Aortic arch: 4 vessel arch configuration, the left vertebral artery arises directly from the arch (series 9, image 21). Moderate mostly calcified arch atherosclerosis. No great vessel origin stenosis. Right carotid system: Partially retropharyngeal course of the right CCA. No right CCA stenosis. At the right carotid bifurcation there is soft and calcified plaque resulting in 60 % stenosis with respect to the distal vessel at the right ICA origin and lesser stenosis in the bulb. Distal to the bulb the cervical right ICA is negative. Left carotid system: Negative left CCA. At the left carotid bifurcation there is mostly calcified atherosclerosis with no definite hemodynamically significant proximal left ICA stenosis. There is also mild calcified plaque distal to the bulb just below the skullbase, but no cervical left ICA stenosis. Vertebral arteries:No proximal right subclavian artery stenosis despite calcified plaque. Dense calcified plaque at the right vertebral artery origin with moderate to severe associated stenosis. Tortuous right V1 segment. The right vertebral artery is dominant. There is occasional V2 segment calcified plaque. No other vertebral artery stenosis in the neck. The will left vertebral artery is non dominant, diminutive, and arises directly from the arch. No left vertebral artery stenosis  identified. CTA HEAD Posterior circulation: Dominant distal right vertebral artery with V4 segment calcified plaque resulting in moderate to severe stenosis best seen on series 605, image 105. The non dominant left vertebral artery is diminutive and functionally terminates in PICA. The basilar artery is diminutive and mildly tortuous. There is mild proximal third basilar artery stenosis. The SCA origins are normal. Fetal type bilateral PCA origins. Right PCA branches are within normal limits. There is left P2 segment moderate stenosis with preserved distal enhancement. Anterior circulation: Densely calcified bilateral cavernous and supra clinoid ICA siphon segments. Moderate bilateral supraclinoid segment stenosis seen on the recent MRA comparison as anterior genu and proximal supr clinoid signal loss bilaterally which was probably suspected due to artifact on that study. Ophthalmic artery and posterior communicating artery origins are normal. Both carotid termini remain patent. MCA origins are normal. Moderate left ACA origin stenosis as seen on the MRA. Normal right ACA origin. Diminutive or absent anterior communicating artery. Bilateral ACA branches are within normal limits. Right MCA M1 segment, bifurcation, and right MCA branches are within normal limits. Left MCA M1 segment, bifurcation, and left MCA branches are within normal limits. Venous sinuses: Patent. Anatomic variants: Non dominant left vertebral artery that arises directly from the arch. Fetal type bilateral PCA origins. Delayed phase: No midline shift, mass effect, or evidence of intracranial mass lesion. Stable gray-white matter differentiation with Patchy and confluent bilateral white matter hypodensity. Chronic left basal ganglia lacunar infarcts. No acute intracranial hemorrhage identified. No abnormal enhancement identified.  IMPRESSION: 1. Bilateral cervical carotid atherosclerosis greater on the right. 60% stenosis of the right ICA origin. 2.  Extensive calcified plaque in both ICA siphons with moderate bilateral supraclinoid segment stenosis. 3. Calcified plaque causes moderate to severe stenosis at the origin of the dominant right vertebral artery. Non dominant left vertebral artery arises directly from the arch and functionally terminates in PICA. 4. Stable circle of Willis branches from the recent MRA including moderate left PCA stenosis. 5. Stable CT appearance of the brain from earlier today. 6. Pulmonary emphysema. Electronically Signed   By: Odessa Fleming M.D.   On: 06/02/2015 18:24      Medications:   Impression:  Principal Problem:   Acute ischemic stroke (HCC) Active Problems:   Tobacco abuse   Diabetes mellitus without complication (HCC)   Hypertension   COPD exacerbation (HCC)     Plan: Continue anti-CHF medical therapy continue aggressive nebulizer therapy as well as IV steroids will address 7 physical therapy rehabilitation  Consultants: Neurology   Procedures   Antibiotics:                   Code Status:   Family Communication:    Disposition Plan see plan above  Time spent: 40 minutes   LOS: 4 days   Johnathon Olden M   06/04/2015, 11:47 AM

## 2015-06-05 ENCOUNTER — Inpatient Hospital Stay (HOSPITAL_COMMUNITY): Payer: Medicare Other

## 2015-06-05 LAB — BLOOD GAS, ARTERIAL
ACID-BASE EXCESS: 3.6 mmol/L — AB (ref 0.0–2.0)
BICARBONATE: 27.6 meq/L — AB (ref 20.0–24.0)
DRAWN BY: 22223
O2 Content: 3 L/min
O2 SAT: 97.3 %
PATIENT TEMPERATURE: 37
TCO2: 20.1 mmol/L (ref 0–100)
pCO2 arterial: 39.7 mmHg (ref 35.0–45.0)
pH, Arterial: 7.453 — ABNORMAL HIGH (ref 7.350–7.450)
pO2, Arterial: 91.8 mmHg (ref 80.0–100.0)

## 2015-06-05 LAB — CULTURE, BLOOD (ROUTINE X 2)
Culture: NO GROWTH
Culture: NO GROWTH

## 2015-06-05 LAB — BASIC METABOLIC PANEL
ANION GAP: 6 (ref 5–15)
BUN: 27 mg/dL — ABNORMAL HIGH (ref 6–20)
CALCIUM: 8.3 mg/dL — AB (ref 8.9–10.3)
CHLORIDE: 108 mmol/L (ref 101–111)
CO2: 25 mmol/L (ref 22–32)
Creatinine, Ser: 0.63 mg/dL (ref 0.44–1.00)
GFR calc non Af Amer: >60 mL/min (ref >60)
GLUCOSE: 161 mg/dL — AB (ref 65–99)
POTASSIUM: 3.5 mmol/L (ref 3.5–5.1)
Sodium: 139 mmol/L (ref 135–145)

## 2015-06-05 LAB — GLUCOSE, CAPILLARY
GLUCOSE-CAPILLARY: 135 mg/dL — AB (ref 65–99)
GLUCOSE-CAPILLARY: 142 mg/dL — AB (ref 65–99)
GLUCOSE-CAPILLARY: 136 mg/dL — AB (ref 65–99)
Glucose-Capillary: 151 mg/dL — ABNORMAL HIGH (ref 65–99)

## 2015-06-05 MED ORDER — METHYLPREDNISOLONE SODIUM SUCC 125 MG IJ SOLR
125.0000 mg | Freq: Four times a day (QID) | INTRAMUSCULAR | Status: DC
  Administered 2015-06-05 – 2015-06-09 (×17): 125 mg via INTRAVENOUS
  Filled 2015-06-05 (×17): qty 2

## 2015-06-05 NOTE — Progress Notes (Addendum)
Patient is still complaining of having dyspnea and calling for neb treatments. Her wheezing appears to be upper airway. ABG obtained to check her oxygen status. Results are 7.453 , 39.27 , 91.8 ,27.6 on 3 lpm / [place] . Her dyspnea appears to be anxiety related which exacerbates her wheezes.

## 2015-06-05 NOTE — Plan of Care (Signed)
Problem: Acute Rehab PT Goals(only PT should resolve) Goal: Patient Will Perform Sitting Balance After 4 days pt will demonstrate sitting balance at EOB requiring only min-modA to remain upright without falling for 5 consecutive minutes.  Goal: PT Additional Goal #1 Pt will demonstrate the ability to provide 50% assistance with A/AROM in L knee and hip in supine after 3 days.  Goal: PT Additional Goal #2 After 5 days pt will be able to provide 50% assistance with rolling in bed supine to R sidelying.

## 2015-06-05 NOTE — Progress Notes (Signed)
Patient has dense left hemiplegia associated stridor or upper airway lungs show mild to moderate his return x-ray wheezes somewhat improved over yesterday continuing with Solu-Medrol and nebulizer therapy Tara GuyJane F Park [id number] DOB: 1946-06-16 DOA: 05/31/2015 PCP: Tara StallingNDIEGO,[name], MD             Physical Exam: Blood pressure 115/57, pulse 71, temperature 97.9 F (36.6 C), temperature source Oral, resp. rate 20, height 5\' 4"  (1.626 Park), weight 139 lb 1.8 oz (63.1 kg), SpO2 94 %. no JVD no carotid bruit no thyromegaly lungs show mild to moderate intrauterine history wheeze scattered rhonchi no rales appreciable positive stridor and upper airway noted inspiratory and expiratory heart regular rhythm no S3-S4 no heaves thrills rubs abdomen soft nontender bowel sounds normoactive dense left hemiplegia noted on neurologic exam patient alert and oriented   Investigations:  Recent Results (from the past 240 hour(s))  Culture, blood (routine x 2) Call MD if unable to obtain prior to antibiotics being given     Status: None   Collection Time: 05/31/15 10:51 PM  Result Value Ref Range Status   Specimen Description BLOOD LEFT ARM  Final   Special Requests BOTTLES DRAWN AEROBIC AND ANAEROBIC 6CC  Final   Culture NO GROWTH 5 DAYS  Final   Report Status 06/05/2015 FINAL  Final  Culture, blood (routine x 2) Call MD if unable to obtain prior to antibiotics being given     Status: None   Collection Time: 05/31/15 10:55 PM  Result Value Ref Range Status   Specimen Description RIGHT ANTECUBITAL  Final   Special Requests BOTTLES DRAWN AEROBIC ONLY 6CC  Final   Culture NO GROWTH 5 DAYS  Final   Report Status 06/05/2015 FINAL  Final  Culture, sputum-assessment     Status: None   Collection Time: 06/01/15  2:16 PM  Result Value Ref Range Status   Specimen Description SPU  Final   Special Requests NONE  Final   Sputum evaluation   Final    THIS SPECIMEN IS ACCEPTABLE FOR SPUTUM  CULTURE PERFORMED AT [facility]    Report Status 06/01/2015 FINAL  Final  Culture, respiratory (NON-Expectorated)     Status: None   Collection Time: 06/01/15  2:20 PM  Result Value Ref Range Status   Specimen Description SPUTUM  Final   Special Requests NONE  Final   Gram Stain   Final    RARE WBC PRESENT, PREDOMINANTLY PMN FEW SQUAMOUS EPITHELIAL CELLS PRESENT MODERATE GRAM POSITIVE COCCI IN PAIRS IN CHAINS IN CLUSTERS RARE GRAM NEGATIVE RODS [facility]    Culture   Final    NORMAL OROPHARYNGEAL FLORA Performed at [facility]    Report Status 06/04/2015 FINAL  Final     Basic Metabolic Panel:  Recent Labs  81/19/1410/07/07 0510 06/05/15 0627  NA 138 139  K 3.8 3.5  CL 106 108  CO2 23 25  GLUCOSE 151* 161*  BUN 35* 27*  CREATININE 0.74 0.63  CALCIUM 8.9 8.3*   Liver Function Tests: No results for input(s): AST, ALT, ALKPHOS, BILITOT, PROT, ALBUMIN in the last 72 hours.   CBC:  Recent Labs  06/02/15 1407  WBC 11.7*  HGB 14.8  HCT 43.0  MCV 79.0  PLT 212    Ct Head Wo Contrast  06/04/2015  CLINICAL DATA:  Left arm weakness. EXAM: CT HEAD WITHOUT CONTRAST TECHNIQUE: Contiguous axial images were obtained from the base of the skull through the vertex without intravenous contrast. COMPARISON:  Yesterday FINDINGS: No new or acute osseous or extracranial findings. New ovoid low-density in the right putamen and corona radiata consistent with interval infarct. Left centrum semiovale infarct noted on brain MRI 05/31/2015 is largely obscured by extensive chronic small vessel ischemic gliosis. Remote lacune in the left putamen with flattened cavity. No hemorrhagic conversion. No hydrocephalus or shift. Age related cerebral volume loss. IMPRESSION: 1. Acute, nonhemorrhagic infarct in the right putamen and corona radiata that is new from head CT yesterday. 2. Recently demonstrated left corona radiata infarct is obscured by extensive chronic ischemic gliosis.  Electronically Signed   By: Tara Park Park.D.   On: 06/04/2015 05:08   Dg Chest Port 1 View  06/05/2015  CLINICAL DATA:  Shortness of breath EXAM: PORTABLE CHEST 1 VIEW COMPARISON:  05/31/2015 FINDINGS: Hyperinflated lungs with emphysematous and bronchitic changes. Markings are diffusely even more prominent than prior. No focal consolidation, effusion, or pneumothorax. Normal heart size and stable mediastinal contours when allowing for rightward rotation. No acute osseous findings. IMPRESSION: 1. Interstitial coarsening above baseline which could be acute bronchitic or congestive. 2. COPD. Electronically Signed   By: Tara Park Park.D.   On: 06/05/2015 05:43      Medications:  Impression:  Principal Problem:   Acute ischemic stroke (HCC) Active Problems:   Tobacco abuse   Diabetes mellitus without complication (HCC)   Hypertension   COPD exacerbation (HCC)     Plan: Continue DuoNeb increase frequency of Solu-Medrol 125 every 6 hours continue physical therapy monitor renal function continue nitrates and diuretics potassium digoxin  Consultants:     Procedures   Antibiotics:                   Code Status:   Family Communication: Discussed with son and daughter-in-law and granddaughter who is graduate nurse   Disposition Plan see plan above  Time spent: 40 minutes   LOS: 5 days   Tara Park Park   06/05/2015, 11:21 AM

## 2015-06-05 NOTE — Evaluation (Signed)
Physical Therapy Evaluation Patient Details Name: Tara Park MRN: [id number] DOB: August 10, 1945 Today's Date: 06/05/2015   History of Present Illness  Pt is a 69 y.o. female with PMH of COPD, HTN, HLD, DM, continued tobacco abuse, COPD, who presented with left-sided weakness.  Patient reports that she started having sudden onset left-sided weakness when she was sitting in the chair at about 18:06. The symptoms lasted for about 15-20 minutes, then resolved spontaneously. At time of screening  on 11/9 all symptoms had resolved. Pt sustained a new CVA while admitted and is being evaluated to determine the extent of this new event.   Clinical Impression  Pt is received semirecumbent in bed upon entry, awake, drowsy, but motivated to participate. No acute distress noted.  Pt is A&Ox3 and pleasant, conversing with considerable dysarthria, facial droop noted on L side. Pt reports one fall in the last 6 months. Pt strength is considerably limited on the left side with greater weakness and hypertonia on the LUE and weakness>hypertonia on the LLE (see below for detail). Light touch sensation intact at this time. Pt remaining on 4L O2 throughout evaluation, a need that has increased since 11/9 PT screening, but remains stable with activity (97%). Pt trunk weakness has limited ability of pt to perform a cough, despite wheezing and phlegm accumulation. Patient presenting with impairment of strength, range of motion, balance, oxygen perfusion, tone, and activity tolerance, limiting ability to perform ADL and mobility tasks at  baseline level of function. Patient will benefit from skilled intervention to address the above impairments and limitations, in order to restore to prior level of function, improve patient safety upon discharge, and to decrease falls risk.       Follow Up Recommendations SNF    Equipment Recommendations  None recommended by PT    Recommendations for Other Services       Precautions /  Restrictions Precautions Precautions: None Restrictions Weight Bearing Restrictions: No      Mobility  Bed Mobility Overal bed mobility: +2 for physical assistance;Needs Assistance Bed Mobility: Supine to Sit;Sit to Supine     Supine to sit: +2 for physical assistance;Total assist Sit to supine: +2 for physical assistance;Total assist      Transfers Overall transfer level: Needs assistance Equipment used: None Transfers: Sit to/from Stand Sit to Stand: Total assist;+2 physical assistance         General transfer comment: Dependent style transfer, holding on to therapist brachium with RUE, requires total assistance to remain up, due to inability to bear weight on LLE.   Ambulation/Gait             General Gait Details: Unable at this time.   Stairs            Wheelchair Mobility    Modified Rankin (Stroke Patients Only)       Balance Overall balance assessment: History of Falls;Needs assistance (1 fall in last 6 months)   Sitting balance-Leahy Scale: Zero       Standing balance-Leahy Scale: Zero                               Pertinent Vitals/Pain Pain Assessment: No/denies pain    Home Living Family/patient expects to be discharged to:: Private residence Living Arrangements: Alone (3 cats) Available Help at Discharge: Family;Available PRN/intermittently (Son and sdaughte rin law live nearby, [place] family in [place] ) Type of Home: House Home Access: Stairs to  enter   Entrance Stairs-Number of Steps: 3 Home Layout: One level Home Equipment: Cane - single point      Prior Function Level of Independence: Independent with assistive device(s)         Comments: not driving, requires transportation assistance.      Hand Dominance   Dominant Hand: Right    Extremity/Trunk Assessment   Upper Extremity Assessment: LUE deficits/detail;Generalized weakness       LUE Deficits / Details: completely flaccid LUE, with  flexor tone in wrist, extensor tone in elbow, and moderate tone in pecs that are maintaining the shoulder in scapular protraction and internal rotation.    Lower Extremity Assessment: LLE deficits/detail   LLE Deficits / Details: Flaccid ankle, with 2/5 strength in quads/hamstrings, and 2/5 in the hip. Mild hypertonicity in ankle and knee, but more difficult to determine in hip in supine. Pt is unable to utilize LLE in a meaningful way for WB in stance.   Cervical / Trunk Assessment: Other exceptions (very weak, slumped forward and/or to sides. Unable to maintain sitting upright without max-total assistance. )  Communication   Communication: No difficulties  Cognition Arousal/Alertness: Lethargic (fell asleep 2x during evaluation) Behavior During Therapy: WFL for tasks assessed/performed Overall Cognitive Status: Within Functional Limits for tasks assessed                      General Comments      Exercises General Exercises - Upper Extremity Shoulder Flexion: PROM;20 reps;Supine;Left Shoulder ADduction: PROM;20 reps;Supine;Left Elbow Flexion: PROM;Left;20 reps Wrist Extension: PROM;Left;20 reps General Exercises - Lower Extremity Ankle Circles/Pumps: PROM;Left;Supine;20 reps Short Arc QuadBarbaraann Park;Left;10 reps;Supine Hip Flexion/Marching: PROM;Left;20 reps;Supine      Assessment/Plan    PT Assessment Patient needs continued PT services  PT Diagnosis Difficulty walking;Hemiplegia non-dominant side   PT Problem List Decreased strength;Decreased range of motion;Decreased activity tolerance;Decreased balance;Decreased mobility;Impaired tone;Decreased coordination;Cardiopulmonary status limiting activity  PT Treatment Interventions DME instruction;Gait training;Functional mobility training;Therapeutic activities;Therapeutic exercise;Patient/family education   PT Goals (Current goals can be found in the Care Plan section) Acute Rehab PT Goals Patient Stated Goal: Pt wants to  get her strength back and go to rehab.  PT Goal Formulation: With patient Time For Goal Achievement: 06/19/15 Potential to Achieve Goals: Fair    Frequency 7X/week   Barriers to discharge Inaccessible home environment;Decreased caregiver support      Co-evaluation               End of Session Equipment Utilized During Treatment: Gait belt Activity Tolerance: Patient limited by lethargy;No increased pain;Patient tolerated treatment well Patient left: in bed;with call bell/phone within reach;with nursing/sitter in room;with family/visitor present Nurse Communication: Mobility status;Other (comment) (explain best practices for positioning of L shoulder. )         Time: 2025-4270 PT Time Calculation (min) (ACUTE ONLY): 28 min   Charges:   PT Evaluation $Initial PT Evaluation Tier I: 1 Procedure PT Treatments $Therapeutic Activity: 8-22 mins   PT G Codes:        Buccola,Allan C 06/21/2015, 11:33 AM 11:37 AM  Rosamaria Lints, PT, DPT [place] License # [id number]

## 2015-06-05 NOTE — Plan of Care (Addendum)
Pt complaining of diff breathing. Vitals indicated no distress, but RN assessment revealed tightness in resps and increased bilat wheezing. Pt lungs sounded wet.  Resp contacted for poss PRN neb treatment and to re-assess pt.  Dr. Truitt MerleNotified to discuss poss reduction rate in IV fluids and maybe one time dose of lasix.

## 2015-06-05 NOTE — Progress Notes (Addendum)
Patient appears to be more labored and wheezing appears worse. She appears positive on her fluids by about 3000. Suspect this causing her breathing to be more labored. Nurse notified. Oxygen increased to 4lpm saturation 98%. Most likely needs diuretic. Patient has foley catheter. Dr. returned page - Orders given to DC IV fluids and order stat CXR. Orders placed.

## 2015-06-06 ENCOUNTER — Inpatient Hospital Stay (HOSPITAL_COMMUNITY): Payer: Medicare Other

## 2015-06-06 LAB — BASIC METABOLIC PANEL
Anion gap: 7 (ref 5–15)
BUN: 24 mg/dL — AB (ref 6–20)
CHLORIDE: 103 mmol/L (ref 101–111)
CO2: 29 mmol/L (ref 22–32)
CREATININE: 0.58 mg/dL (ref 0.44–1.00)
Calcium: 8.6 mg/dL — ABNORMAL LOW (ref 8.9–10.3)
GFR calc Af Amer: >60 mL/min (ref >60)
GFR calc non Af Amer: >60 mL/min (ref >60)
GLUCOSE: 152 mg/dL — AB (ref 65–99)
POTASSIUM: 3.8 mmol/L (ref 3.5–5.1)
Sodium: 139 mmol/L (ref 135–145)

## 2015-06-06 LAB — CBC WITH DIFFERENTIAL/PLATELET
Basophils Absolute: 0 10*3/uL (ref 0.0–0.1)
Basophils Relative: 0 %
EOS PCT: 0 %
Eosinophils Absolute: 0 10*3/uL (ref 0.0–0.7)
HCT: 46.4 % — ABNORMAL HIGH (ref 36.0–46.0)
Hemoglobin: 15.8 g/dL — ABNORMAL HIGH (ref 12.0–15.0)
LYMPHS ABS: 1 10*3/uL (ref 0.7–4.0)
LYMPHS PCT: 8 %
MCH: 27 pg (ref 26.0–34.0)
MCHC: 34.1 g/dL (ref 30.0–36.0)
MCV: 79.3 fL (ref 78.0–100.0)
MONO ABS: 0.2 10*3/uL (ref 0.1–1.0)
MONOS PCT: 2 %
Neutro Abs: 11.2 10*3/uL — ABNORMAL HIGH (ref 1.7–7.7)
Neutrophils Relative %: 90 %
PLATELETS: 229 10*3/uL (ref 150–400)
RBC: 5.85 MIL/uL — ABNORMAL HIGH (ref 3.87–5.11)
RDW: 13.9 % (ref 11.5–15.5)
WBC: 12.4 10*3/uL — ABNORMAL HIGH (ref 4.0–10.5)

## 2015-06-06 LAB — GLUCOSE, CAPILLARY
GLUCOSE-CAPILLARY: 138 mg/dL — AB (ref 65–99)
Glucose-Capillary: 141 mg/dL — ABNORMAL HIGH (ref 65–99)
Glucose-Capillary: 194 mg/dL — ABNORMAL HIGH (ref 65–99)
Glucose-Capillary: 162 mg/dL — ABNORMAL HIGH (ref 65–99)

## 2015-06-06 MED ORDER — INFLUENZA VAC SPLIT QUAD 0.5 ML IM SUSY
0.5000 mL | PREFILLED_SYRINGE | INTRAMUSCULAR | Status: AC
  Administered 2015-06-07: 0.5 mL via INTRAMUSCULAR
  Filled 2015-06-06: qty 0.5

## 2015-06-06 MED ORDER — CLOPIDOGREL BISULFATE 75 MG PO TABS
75.0000 mg | ORAL_TABLET | Freq: Every day | ORAL | Status: DC
  Administered 2015-06-07 – 2015-06-10 (×4): 75 mg via ORAL
  Filled 2015-06-06 (×4): qty 1

## 2015-06-06 NOTE — Progress Notes (Signed)
Speech Language Pathology Treatment: Dysphagia  Patient Details Name: Tara Park MRN: [id number] DOB: 1946-04-13 Today's Date: 06/06/2015 Time: [id number] SLP Time Calculation (min) (ACUTE ONLY): 44 min  Assessment / Plan / Recommendation Clinical Impression  Pt seen for assessment/tolerance of current dysphagia 3 (mechanical soft) and honey thick liquid diet. Noted xerostomia and poor oral hygiene, which improved with oral care. SLP assisted with self feeding due to left upper extremity weakness. Pt exhibited left sided anterior spillage of all boluses, prolonged mastication of mech soft consistencies with left sided buccal pocketting, oral residuals, and difficulty with AP transfer.  Educated regarding safe swallow strategies including lingual or finger sweep, placement of food and liquid in right oral sulci, swallow x2, and alternation of liquids and solids. Pt able to implement with verbal cues. Concern for weight loss given severity of dysphagia affecting endurance across meals. Pt with noted fatigue towards end of session. Recommend diet downgrade to dysphagia 2 (chopped) and honey thick liquids. Pt would benefit from nutritional consult. ST to monitor for DC planning and follow up if still in acute setting.     HPI HPI: Pt is a 69 y.o. female with PMH of COPD, hypertension, hyperlipidemia, diabetes mellitus, tobacco abuse, COPD, who presented with left-sided weakness. 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 Pt was initially evaluated by SLP on 06-01-15 subsequentely without associated swallow deficits. Since then pt with recurrent onset of stroke symptoms and exhibited notable left sided weakness, dysarthria, and dysphagia. CT of head on 06-04-15 revealed acute, nonhemorrhagic infarct in the right putamen and corona radiata.       SLP Plan  Continue with current plan of care     Recommendations  Diet  recommendations: Dysphagia 2 (fine chop);Honey-thick liquid Liquids provided via: Teaspoon;Cup Medication Administration: Whole meds with puree Supervision: Full supervision/cueing for compensatory strategies;Staff to assist with self feeding Compensations: Slow rate;Lingual sweep for clearance of pocketing;Multiple dry swallows after each bite/sip;Follow solids with liquid;Small sips/bites Postural Changes and/or Swallow Maneuvers: Seated upright 90 degrees;Upright 30-60 min after meal              Oral Care Recommendations: Oral care BID Follow up Recommendations: Skilled Nursing facility Plan: Continue with current plan of care  Marcene Duoshelsea Sumney MA, CCC-SLP Acute Care Speech Language Pathologist    Kennieth RadSumney, Jenny Lai E 06/06/2015, 3:46 PM

## 2015-06-06 NOTE — Progress Notes (Signed)
Patient with dense left him he please should bronchospasm 30% improved since yesterday still with stridor Clint GuyJane F Jorgensen QMV:[id number] DOB: [birth year] DOA: 05/31/2015 PCP: [name],Octavis Sheeler M, MD             Physical Exam: Blood pressure 167/80, pulse 89, temperature 97.6 F (36.4 C), temperature source Oral, resp. rate 25, height 5\' 4"  (1.626 m), weight 139 lb 1.8 oz (63.1 kg), SpO2 93 %. lungs scattered rhonchi mild inspiratory Wheezes no rales audible heart regular rhythm no S3-S4 no heaves thrills rubs   Investigations:  Recent Results (from the past 240 hour(s))  Culture, blood (routine x 2) Call MD if unable to obtain prior to antibiotics being given     Status: None   Collection Time: 05/31/15 10:51 PM  Result Value Ref Range Status   Specimen Description BLOOD LEFT ARM  Final   Special Requests BOTTLES DRAWN AEROBIC AND ANAEROBIC 6CC  Final   Culture NO GROWTH 5 DAYS  Final   Report Status 06/05/2015 FINAL  Final  Culture, blood (routine x 2) Call MD if unable to obtain prior to antibiotics being given     Status: None   Collection Time: 05/31/15 10:55 PM  Result Value Ref Range Status   Specimen Description RIGHT ANTECUBITAL  Final   Special Requests BOTTLES DRAWN AEROBIC ONLY 6CC  Final   Culture NO GROWTH 5 DAYS  Final   Report Status 06/05/2015 FINAL  Final  Culture, sputum-assessment     Status: None   Collection Time: 06/01/15  2:16 PM  Result Value Ref Range Status   Specimen Description SPU  Final   Special Requests NONE  Final   Sputum evaluation   Final    THIS SPECIMEN IS ACCEPTABLE FOR SPUTUM CULTURE PERFORMED AT [facility]    Report Status 06/01/2015 FINAL  Final  Culture, respiratory (NON-Expectorated)     Status: None   Collection Time: 06/01/15  2:20 PM  Result Value Ref Range Status   Specimen Description SPUTUM  Final   Special Requests NONE  Final   Gram Stain   Final    RARE WBC PRESENT, PREDOMINANTLY PMN FEW SQUAMOUS EPITHELIAL CELLS  PRESENT MODERATE GRAM POSITIVE COCCI IN PAIRS IN CHAINS IN CLUSTERS RARE GRAM NEGATIVE RODS Performed at [facility]    Culture   Final    NORMAL OROPHARYNGEAL FLORA Performed at [facility]    Report Status 06/04/2015 FINAL  Final     Basic Metabolic Panel:  Recent Labs  28/41/3211/13/16 0627 06/06/15 0632  NA 139 139  K 3.5 3.8  CL 108 103  CO2 25 29  GLUCOSE 161* 152*  BUN 27* 24*  CREATININE 0.63 0.58  CALCIUM 8.3* 8.6*   Liver Function Tests: No results for input(s): AST, ALT, ALKPHOS, BILITOT, PROT, ALBUMIN in the last 72 hours.   CBC: No results for input(s): WBC, NEUTROABS, HGB, HCT, MCV, PLT in the last 72 hours.  Dg Chest Port 1 View  06/05/2015  CLINICAL DATA:  Shortness of breath EXAM: PORTABLE CHEST 1 VIEW COMPARISON:  05/31/2015 FINDINGS: Hyperinflated lungs with emphysematous and bronchitic changes. Markings are diffusely even more prominent than prior. No focal consolidation, effusion, or pneumothorax. Normal heart size and stable mediastinal contours when allowing for rightward rotation. No acute osseous findings. IMPRESSION: 1. Interstitial coarsening above baseline which could be acute bronchitic or congestive. 2. COPD. Electronically Signed   By: Marnee SpringJonathon  Watts M.D.   On: 06/05/2015 05:43      Medications:  Impression:  Principal Problem:   Acute ischemic stroke [facility]) Active Problems:   Tobacco abuse   Diabetes mellitus without complication (HCC)   Hypertension   COPD exacerbation (HCC)     Plan: Continue Solu-Medrol 125 IV every 6 continue aggressive nebulizer therapy will check CBC in a.m.  Consultants:    Procedure   Antibiotics:                   Code Status:  Family Communication:    Disposition Plan   Time spent: 30 minutes   LOS: 6 days   Cai Anfinson M   06/06/2015, 12:29 PM

## 2015-06-06 NOTE — Clinical Social Work Placement (Signed)
   CLINICAL SOCIAL WORK PLACEMENT  NOTE  Date:  06/06/2015  Patient Details  Name: Tara GuyJane F Park MRN: [id number] Date of Birth: 02/14/1946  Clinical Social Work is seeking post-discharge placement for this patient at the Skilled  Nursing Facility level of care (*CSW will initial, date and re-position this form in  chart as items are completed):  Yes   Patient/family provided with [place] Clinical Social Work Department's list of facilities offering this level of care within the geographic area requested by the patient (or if unable, by the patient's family).  Yes   Patient/family informed of their freedom to choose among providers that offer the needed level of care, that participate in Medicare, Medicaid or managed care program needed by the patient, have an available bed and are willing to accept the patient.  Yes   Patient/family informed of [facility]'s ownership interest in [facility] and [facility], as well as of the fact that they are under no obligation to receive care at these facilities.  PASRR submitted to EDS on 06/06/15     PASRR number received on 06/06/15     Existing PASRR number confirmed on       FL2 transmitted to all facilities in geographic area requested by pt/family on 06/06/15     FL2 transmitted to all facilities within larger geographic area on       Patient informed that his/her managed care company has contracts with or will negotiate with certain facilities, including the following:            Patient/family informed of bed offers received.  Patient chooses bed at       Physician recommends and patient chooses bed at      Patient to be transferred to   on  .  Patient to be transferred to facility by       Patient family notified on   of transfer.  Name of family member notified:        PHYSICIAN       Additional Comment:    _______________________________________________ Karn CassisStultz, Nehemias Sauceda [place]aberger, LCSW 06/06/2015, 10:14  AM [phone]

## 2015-06-06 NOTE — Progress Notes (Signed)
Patient still has audible exp wheezes with exertion or during breathing treatment. These appear to be upper airway for the most part. Have tried to decrease number of treatments patient is receiving and allow more rest. Patient appears to have done better with less nebs than more.

## 2015-06-06 NOTE — Clinical Social Work Note (Signed)
Clinical Social Work Assessment  Patient Details  Name: Tara Park MRN: [id number] Date of Birth: 18-Apr-1946  Date of referral:  06/06/15               Reason for consult:  Discharge Planning                Permission sought to share information with:    Permission granted to share information::     Name::        Agency::     Relationship::     Contact Information:     Housing/Transportation Living arrangements for the past 2 months:  Single Family Home Source of Information:  Patient Patient Interpreter Needed:  None Criminal Activity/Legal Involvement Pertinent to Current Situation/Hospitalization:  No - Comment as needed Significant Relationships:  Adult Children, Other Family Members Lives with:  Self Do you feel safe going back to the place where you live?  No Need for family participation in patient care:  Yes (Comment)  Care giving concerns:  Pt lives alone and will require extensive care at d/c.   Social Worker assessment / plan:  CSW met with pt at bedside. Pt alert and oriented, but short of breath and said she is feeling "rough". She states she lives alone, but has good support from her son and daughter-in-law who live nearby. Pt is independent at baseline and ambulates with a cane. She relies on the [facility] bus, [facility], or family for transport. Pt admitted due to stroke. PT evaluated pt and recommend SNF. CSW discussed this with her and she requests placement in [place]. She is aware of Medicare coverage/criteria. Will initiate bed search. Pt plans to discuss with her family later today.   Employment status:  Retired Forensic scientist:  Medicare PT Recommendations:  [place] / Referral to community resources:  [place]  Patient/Family's Response to care:  Pt agreeable to begin SNF bed search.   Patient/Family's Understanding of and Emotional Response to Diagnosis, Current Treatment, and Prognosis:  Pt reports  understanding of admission diagnosis and treatment plan. She is concerned about when she will d/c and will discuss with MD. Pt shared she is scared about her future due to stroke. CSW listened and provided support.   Emotional Assessment Appearance:  Appears stated age Attitude/Demeanor/Rapport:  Apprehensive Affect (typically observed):  Other (Pt states she is scared) Orientation:  Oriented to Self, Oriented to Place, Oriented to  Time, Oriented to Situation Alcohol / Substance use:  Not Applicable Psych involvement (Current and /or in the community):  No (Comment)  Discharge Needs  Concerns to be addressed:  Discharge Planning Concerns Readmission within the last 30 days:  No Current discharge risk:  Dependent with Mobility Barriers to Discharge:  Continued Medical Work up   [employer], [employer], [place] 06/06/2015, 10:19 AM [phone]

## 2015-06-06 NOTE — Care Management Note (Signed)
Case Management Note  Patient Details  Name: Tara GuyJane F Park MRN: [id number] Date of Birth: [birth year]  Subjective/Objective:                    Action/Plan:   Expected Discharge Date:                  Expected Discharge Plan:  Home/Self Care  In-House Referral:  NA  Discharge planning Services  CM Consult  Post Acute Care Choice:  NA Choice offered to:  NA  DME Arranged:    DME Agency:     HH Arranged:    HH Agency:     Status of Service:  Completed, signed off  Medicare Important Message Given:  Yes Date Medicare IM Given:    Medicare IM give by:    Date Additional Medicare IM Given:    Additional Medicare Important Message give by:     If discussed at Long Length of Stay Meetings, dates discussed:    Additional Comments: Pt still with wheezing and requiring IV steroids. MD anticipates discharge within 48 hours. CSW is aware that PT is recommending SNF. Pt had a new CVA on the weekend.  Arlyss QueenBlackwell, Juliona Vales Vermillionrowder, RN 06/06/2015, 2:57 PM

## 2015-06-06 NOTE — Progress Notes (Signed)
Physical Therapy Treatment Patient Details Name: Tara Park MRN: [id number] DOB: 09-Jun-1946 Today's Date: 06/06/2015    History of Present Illness Pt is a 69 y.o. female with PMH of COPD, HTN, HLD, DM, continued tobacco abuse, COPD, who presented with left-sided weakness.  Patient reports that she started having sudden onset left-sided weakness when she was sitting in the chair at about 18:06. The symptoms lasted for about 15-20 minutes, then resolved spontaneously. At time of screening  on 11/9 all symptoms had resolved. Pt sustained a new CVA while admitted and is being evaluated to determine the extent of this new event.     PT Comments    Pt very drowsy and lethargic, unable to stay awake.  She is noted to have extremely strong extensor tone in the left UE and LE.  Some active motion found at the left pectorals and posterior deltoid left.  She had no active motion noted in the LLE but very strong extensor tone.  Rotation used to try to help decrease tone but this did not affect it.  I indicated that I would like to progress her to sitting today but she declined due to fatigue.  Follow Up Recommendations  SNF     Equipment Recommendations  None recommended by PT    Recommendations for Other Services OT consult     Precautions / Restrictions Precautions Precautions: Fall Restrictions Weight Bearing Restrictions: No    Mobility  Bed Mobility               General bed mobility comments: pt refuses due to fatigue  Transfers                 General transfer comment: pt refuses due to fatigue  Ambulation/Gait             General Gait Details: unable   Stairs            Wheelchair Mobility    Modified Rankin (Stroke Patients Only)       Balance                                    Cognition Arousal/Alertness: Lethargic Behavior During Therapy: WFL for tasks assessed/performed Overall Cognitive Status: Within Functional  Limits for tasks assessed                      Exercises General Exercises - Upper Extremity Shoulder Flexion: PROM;Left;15 reps;Supine Shoulder Horizontal ABduction: AAROM;Left;10 reps;Supine Shoulder Horizontal ADduction: AAROM;Left;10 reps;Supine Elbow Flexion: PROM;Left;10 reps;Supine Wrist Extension: PROM;Left;10 reps;Supine Digit Composite Flexion: PROM;Left;10 reps;Supine Composite Extension: AROM;Left;10 reps;Supine General Exercises - Lower Extremity Ankle Circles/Pumps: AROM;PROM;10 reps;Supine;Right;Left Heel Slides: PROM;AROM;Right;Left;10 reps;Supine Hip ABduction/ADduction: PROM;AROM;Left;Right;10 reps;Supine Straight Leg Raises: AROM;Right;10 reps;Supine    General Comments        Pertinent Vitals/Pain Pain Assessment: No/denies pain    Home Living                      Prior Function            PT Goals (current goals can now be found in the care plan section) Progress towards PT goals: Not progressing toward goals - comment (very lethargic)    Frequency  Min 5X/week    PT Plan Frequency needs to be updated (due to pt lethargy)    Co-evaluation  End of Session   Activity Tolerance: Patient limited by fatigue;Patient limited by lethargy Patient left: in bed;with call bell/phone within reach;with bed alarm set     Time: [id number] PT Time Calculation (min) (ACUTE ONLY): 34 min  Charges:  $Therapeutic Exercise: 23-37 mins                    G CodesKonrad Penta:      Selda Jalbert L  PT 06/06/2015, 2:46 PM [phone]

## 2015-06-06 NOTE — Progress Notes (Signed)
Patient ID: Tara Park, female   DOB: Jan 24, 1946, 69 y.o.   MRN: [id number]  [place] A. Merlene Laughter, MD     [email or web address]          Tara Park is an 69 y.o. female.   Assessment/Plan:  The patient most likely had a TIA a couple days ago and now has progressed to a completed infarct unfortunately - lacunar type.  Acute left subcortical infarct likely asymptomatic given the location.   Multiple bilateral lacunar infarcts.  Acute lacunar infarction. Risk factors DM, HTN, tobacco use, and age.  RECOMMENDATION: Agree with antiplatelet aspirin therapy. 325 mg daily as recommended. Add plavix short term 4 weeks Smoking cessation. Diabetes control. Blood pressure control. Stop Naproxen and NSAIDS - given the known risks to increase thromboembolic event such as stroke and heart attack.       The patient unfortunately has had significant progression of her left-sided symptoms over the last 2-3 days. She reports that she has not been able to move the left side for the last 2 nights. Swallowing is okay however. She also continues to have some dyspnea.  GENERAL: She is acutely dyspneic.  HEENT: Supple. Atraumatic normocephalic.   ABDOMEN: soft  EXTREMITIES: There is marked pitting edema of the right distal leg and ankle with marked limitation of dorsiflexion and the plantarflexion of the ankle.  She had a fracture there and it is fused. Significant arthritic changes of the knees bilaterally.   BACK: Normal.  SKIN: Normal by inspection.   MENTAL STATUS: Alert and oriented. She does have mild dysarthria. There is flattening of the nasolabial fold on the left side.   CRANIAL NERVES: Pupils are equal, round and reactive to light and accommodation; extra ocular movements are full, there is no significant nystagmus; visual fields are full; There is mild left lower facial weakness; tongue is midline; uvula is midline; shoulder elevation is  normal.  MOTOR:Right side shows normal tone, bulk and strength. Left upper extremity 1/5 and the left leg 2/5.  COORDINATION: Left finger to nose is normal, right finger to nose is normal, No rest tremor; no intention tremor; no postural tremor; no bradykinesia.  REFLEXES: Deep tendon reflexes are symmetrical and normal. Babinski reflexes are flexor bilaterally.   SENSATION: Normal to light touch. Normal tactile and visual double simultaneous stimulation.  NIH stroke scale 2.     Objective: Vital signs in last 24 hours: Temp:  [97.6 F (36.4 C)-98.9 F (37.2 C)] 97.6 F (36.4 C) (11/14 0816) Pulse Rate:  [89-121] 89 (11/14 0816) Resp:  [20-25] 25 (11/14 0816) BP: (148-203)/(80-129) 167/80 mmHg (11/14 0816) SpO2:  [91 %-97 %] 93 % (11/14 1443)  Intake/Output from previous day: 11/13 0701 - 11/14 0700 In: 120 [P.O.:120] Out: 800 [Urine:800] Intake/Output this shift: Total I/O In: 360 [P.O.:360] Out: 750 [Urine:750] Nutritional status: DIET DYS 2 Room service appropriate?: Yes; Fluid consistency:: Honey Thick   Lab Results: Results for orders placed or performed during the hospital encounter of 05/31/15 (from the past 48 hour(s))  Glucose, capillary     Status: Abnormal   Collection Time: 06/04/15 11:01 PM  Result Value Ref Range   Glucose-Capillary 135 (H) 65 - 99 mg/dL  Basic metabolic panel     Status: Abnormal   Collection Time: 06/05/15  6:27 AM  Result Value Ref Range   Sodium 139 135 - 145 mmol/L   Potassium 3.5 3.5 - 5.1 mmol/L   Chloride 108 101 - 111 mmol/L  CO2 25 22 - 32 mmol/L   Glucose, Bld 161 (H) 65 - 99 mg/dL   BUN 27 (H) 6 - 20 mg/dL   Creatinine, Ser 0.63 0.44 - 1.00 mg/dL   Calcium 8.3 (L) 8.9 - 10.3 mg/dL   GFR calc non Af Amer >60 >60 mL/min   GFR calc Af Amer >60 >60 mL/min    Comment: (NOTE) The eGFR has been calculated using the CKD EPI equation. This calculation has not been validated in all clinical situations. eGFR's persistently  <60 mL/min signify possible Chronic Kidney Disease.    Anion gap 6 5 - 15  Glucose, capillary     Status: Abnormal   Collection Time: 06/05/15  7:51 AM  Result Value Ref Range   Glucose-Capillary 151 (H) 65 - 99 mg/dL  Glucose, capillary     Status: Abnormal   Collection Time: 06/05/15 11:35 AM  Result Value Ref Range   Glucose-Capillary 135 (H) 65 - 99 mg/dL  Glucose, capillary     Status: Abnormal   Collection Time: 06/05/15  4:49 PM  Result Value Ref Range   Glucose-Capillary 142 (H) 65 - 99 mg/dL  Blood gas, arterial     Status: Abnormal   Collection Time: 06/05/15  8:45 PM  Result Value Ref Range   O2 Content 3.0 L/min   Delivery systems NASAL CANNULA    pH, Arterial 7.453 (H) 7.350 - 7.450   pCO2 arterial 39.7 35.0 - 45.0 mmHg   pO2, Arterial 91.8 80.0 - 100.0 mmHg   Bicarbonate 27.6 (H) 20.0 - 24.0 mEq/L   TCO2 20.1 0 - 100 mmol/L   Acid-Base Excess 3.6 (H) 0.0 - 2.0 mmol/L   O2 Saturation 97.3 %   Patient temperature 37.0    Collection site RIGHT RADIAL    Drawn by [id number]    Sample type ARTERIAL    Allens test (pass/fail) PASS PASS  Glucose, capillary     Status: Abnormal   Collection Time: 06/05/15 10:06 PM  Result Value Ref Range   Glucose-Capillary 136 (H) 65 - 99 mg/dL  Basic metabolic panel     Status: Abnormal   Collection Time: 06/06/15  6:32 AM  Result Value Ref Range   Sodium 139 135 - 145 mmol/L   Potassium 3.8 3.5 - 5.1 mmol/L   Chloride 103 101 - 111 mmol/L   CO2 29 22 - 32 mmol/L   Glucose, Bld 152 (H) 65 - 99 mg/dL   BUN 24 (H) 6 - 20 mg/dL   Creatinine, Ser 0.58 0.44 - 1.00 mg/dL   Calcium 8.6 (L) 8.9 - 10.3 mg/dL   GFR calc non Af Amer >60 >60 mL/min   GFR calc Af Amer >60 >60 mL/min    Comment: (NOTE) The eGFR has been calculated using the CKD EPI equation. This calculation has not been validated in all clinical situations. eGFR's persistently <60 mL/min signify possible Chronic Kidney Disease.    Anion gap 7 5 - 15  Glucose, capillary      Status: Abnormal   Collection Time: 06/06/15  8:00 AM  Result Value Ref Range   Glucose-Capillary 138 (H) 65 - 99 mg/dL   Comment 1 Notify RN   Glucose, capillary     Status: Abnormal   Collection Time: 06/06/15 12:12 PM  Result Value Ref Range   Glucose-Capillary 141 (H) 65 - 99 mg/dL   Comment 1 Notify RN   CBC with Differential/Platelet     Status: Abnormal   Collection Time:  06/06/15  1:45 PM  Result Value Ref Range   WBC 12.4 (H) 4.0 - 10.5 K/uL   RBC 5.85 (H) 3.87 - 5.11 MIL/uL   Hemoglobin 15.8 (H) 12.0 - 15.0 g/dL   HCT 46.4 (H) 36.0 - 46.0 %   MCV 79.3 78.0 - 100.0 fL   MCH 27.0 26.0 - 34.0 pg   MCHC 34.1 30.0 - 36.0 g/dL   RDW 13.9 11.5 - 15.5 %   Platelets 229 150 - 400 K/uL   Neutrophils Relative % 90 %   Neutro Abs 11.2 (H) 1.7 - 7.7 K/uL   Lymphocytes Relative 8 %   Lymphs Abs 1.0 0.7 - 4.0 K/uL   Monocytes Relative 2 %   Monocytes Absolute 0.2 0.1 - 1.0 K/uL   Eosinophils Relative 0 %   Eosinophils Absolute 0.0 0.0 - 0.7 K/uL   Basophils Relative 0 %   Basophils Absolute 0.0 0.0 - 0.1 K/uL  Glucose, capillary     Status: Abnormal   Collection Time: 06/06/15  4:45 PM  Result Value Ref Range   Glucose-Capillary 194 (H) 65 - 99 mg/dL   Comment 1 Notify RN     Lipid Panel No results for input(s): CHOL, TRIG, HDL, CHOLHDL, VLDL, LDLCALC in the last 72 hours.  Studies/Results: The patient had CT scan is reviewed in person. 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 Nothing acute is seen. There is also moderate global atrophy. No hemorrhages.   Medications:  Scheduled Meds: . aspirin  300 mg Rectal Daily   Or  . aspirin  325 mg Oral Daily  . dextromethorphan-guaiFENesin  1 tablet Oral BID  . digoxin  0.125 mg Oral Daily  . furosemide  20 mg Oral Daily  . heparin  5,000 Units Subcutaneous 3 times per day  . [START ON 06/07/2015] Influenza vac split quadrivalent PF  0.5 mL Intramuscular Tomorrow-1000  . insulin aspart   0-9 Units Subcutaneous TID WC  . ipratropium-albuterol  3 mL Nebulization Q4H WA  . isosorbide mononitrate  30 mg Oral Daily  . lisinopril  10 mg Oral Daily  . methylPREDNISolone (SOLU-MEDROL) injection  125 mg Intravenous Q6H  . pravastatin  40 mg Oral QPM  . traZODone  50 mg Oral QHS  . varenicline  1 mg Oral BID WC   Continuous Infusions:   PRN Meds:.albuterol, HYDROcodone-acetaminophen, LORazepam, senna-docusate    Critical care time 55mn  LOS: 6 days   Linell Meldrum A. DMerlene Laughter M.D.  Diplomate, ATax adviserof Psychiatry and Neurology ( Neurology).

## 2015-06-06 NOTE — NC FL2 (Signed)
[place] MEDICAID FL2 LEVEL OF CARE SCREENING TOOL     IDENTIFICATION  Patient Name: Tara Park Birthdate: 06-21-1946 Sex: female Admission Date (Current Location): 05/31/2015  [place] and [place] Number: Aaron Edelman [id number] K Facility and Address:  [facility],  [address], [place] [address]      Provider Number: [id number]  Attending Physician Name and Address:  Oval Linsey, MD  Relative Name and Phone Number:       Current Level of Care: Hospital Recommended Level of Care: Skilled Nursing Facility Prior Approval Number:    Date Approved/Denied:   PASRR Number:  [id number]   Discharge Plan: SNF    Current Diagnoses: Patient Active Problem List   Diagnosis Date Noted  . Acute ischemic stroke (HCC) 05/31/2015  . COPD exacerbation (HCC) 05/31/2015  . Tobacco abuse   . Diabetes mellitus without complication (HCC)   . Hypertension   . BURSITIS, HIP 09/09/2007  . TRIGGER FINGER 09/09/2007  . FRACTURE, FEMUR, INTERTROCHANTERIC REGION 09/09/2007  . DIABETES 08/01/2007    Orientation ACTIVITIES/SOCIAL BLADDER RESPIRATION    Self, Time, Situation, Place  Family supportive Indwelling catheter O2 (As needed) (3L)  BEHAVIORAL SYMPTOMS/MOOD NEUROLOGICAL BOWEL NUTRITION STATUS  Other (Comment) (n/a)  (n/a) Incontinent Diet (Dysphagia 3 with honey thick liquids. See d/c summary for updates)  PHYSICIAN VISITS COMMUNICATION OF NEEDS Height & Weight Skin    Verbally  (162.6 cm) 139 lbs. Other (Comment) (Bruising)          AMBULATORY STATUS RESPIRATION     (not ambulatory at this time) O2 (As needed) (3L)      Personal Care Assistance Level of Assistance  Bathing, Feeding, Dressing Bathing Assistance: Maximum assistance Feeding assistance: Maximum assistance Dressing Assistance: Maximum assistance      Functional Limitations Info  Sight, Hearing, Speech Sight Info: Adequate Hearing Info: Adequate Speech Info: Adequate        SPECIAL CARE FACTORS FREQUENCY  Speech therapy     PT Frequency: 5             Additional Factors Info  Psychotropic, Insulin Sliding Scale Code Status Info: Full code Allergies Info: No known allergies Psychotropic Info: Trazodone Insulin Sliding Scale Info: 3x day       Current Medications (06/06/2015): Current Facility-Administered Medications  Medication Dose Route Frequency Provider Last Rate Last Dose  . albuterol (PROVENTIL) (2.5 MG/3ML) 0.083% nebulizer solution 2.5 mg  2.5 mg Nebulization Q2H PRN Lorretta Harp, MD   2.5 mg at 06/05/15 1342  . aspirin suppository 300 mg  300 mg Rectal Daily Lorretta Harp, MD       Or  . aspirin tablet 325 mg  325 mg Oral Daily Lorretta Harp, MD   325 mg at 06/05/15 0840  . dextromethorphan-guaiFENesin (MUCINEX DM) 30-600 MG per 12 hr tablet 1 tablet  1 tablet Oral BID Lorretta Harp, MD   1 tablet at 06/05/15 2129  . digoxin (LANOXIN) tablet 0.125 mg  0.125 mg Oral Daily Oval Linsey, MD   0.125 mg at 06/05/15 0840  . furosemide (LASIX) tablet 20 mg  20 mg Oral Daily Oval Linsey, MD   20 mg at 06/05/15 0839  . heparin injection 5,000 Units  5,000 Units Subcutaneous 3 times per day Lorretta Harp, MD   5,000 Units at 06/06/15 0701  . HYDROcodone-acetaminophen (NORCO) 7.5-325 MG per tablet 1 tablet  1 tablet Oral Q6H PRN Lorretta Harp, MD      . Melene Muller ON 06/07/2015] Influenza vac split quadrivalent  PF (FLUARIX) injection 0.5 mL  0.5 mL Intramuscular Tomorrow-1000 Oval Linsey, MD      . insulin aspart (novoLOG) injection 0-9 Units  0-9 Units Subcutaneous TID WC Lorretta Harp, MD   1 Units at 06/05/15 1748  . ipratropium-albuterol (DUONEB) 0.5-2.5 (3) MG/3ML nebulizer solution 3 mL  3 mL Nebulization Q4H WA Oval Linsey, MD   3 mL at 06/06/15 0738  . isosorbide mononitrate (IMDUR) 24 hr tablet 30 mg  30 mg Oral Daily Oval Linsey, MD   30 mg at 06/05/15 0839  . lisinopril (PRINIVIL,ZESTRIL) tablet 10 mg  10 mg Oral Daily Oval Linsey, MD   10  mg at 06/05/15 0839  . LORazepam (ATIVAN) tablet 1 mg  1 mg Oral QHS PRN Oval Linsey, MD   1 mg at 06/06/15 0018  . methylPREDNISolone sodium succinate (SOLU-MEDROL) 125 mg/2 mL injection 125 mg  125 mg Intravenous Q6H Oval Linsey, MD   125 mg at 06/06/15 0701  . pravastatin (PRAVACHOL) tablet 40 mg  40 mg Oral QPM Lorretta Harp, MD   40 mg at 06/05/15 1749  . senna-docusate (Senokot-S) tablet 1 tablet  1 tablet Oral QHS PRN Lorretta Harp, MD      . traZODone (DESYREL) tablet 50 mg  50 mg Oral QHS Lorretta Harp, MD   50 mg at 06/05/15 2129  . varenicline (CHANTIX) tablet 1 mg  1 mg Oral BID WC Lorretta Harp, MD   1 mg at 06/02/15 2021   Do not use this list as official medication orders. Please verify with discharge summary.  Discharge Medications:   Medication List    ASK your doctor about these medications        albuterol (2.5 MG/3ML) 0.083% nebulizer solution  Commonly known as:  PROVENTIL  Take 2.5 mg by nebulization every 6 (six) hours as needed for wheezing or shortness of breath.     PROAIR HFA 108 (90 BASE) MCG/ACT inhaler  Generic drug:  albuterol  Inhale 2 puffs into the lungs every 6 (six) hours as needed for wheezing or shortness of breath.     CHANTIX CONTINUING MONTH PAK 1 MG tablet  Generic drug:  varenicline  Take 1 tablet by mouth 2 (two) times daily.     chlorpheniramine 4 MG tablet  Commonly known as:  CHLOR-TRIMETON  Take 4 mg by mouth every 4 (four) hours.     COMBIVENT RESPIMAT 20-100 MCG/ACT Aers respimat  Generic drug:  Ipratropium-Albuterol  Inhale 2 puffs into the lungs 4 (four) times daily.     HYDROcodone-acetaminophen 7.5-325 MG tablet  Commonly known as:  NORCO  Take 1 tablet by mouth every 6 (six) hours as needed for moderate pain or severe pain.     lisinopril 10 MG tablet  Commonly known as:  PRINIVIL,ZESTRIL  Take 10 mg by mouth every morning.     metFORMIN 500 MG tablet  Commonly known as:  GLUCOPHAGE  Take 500 mg by mouth 2 (two) times  daily with a meal.     mometasone 50 MCG/ACT nasal spray  Commonly known as:  NASONEX  Place 1-2 sprays into the nose daily as needed (for allergies).     naproxen 500 MG tablet  Commonly known as:  NAPROSYN  Take 500 mg by mouth 2 (two) times daily.     phenylephrine 10 MG Tabs tablet  Commonly known as:  SUDAFED PE  Take 10 mg by mouth every 4 (four) hours.     pravastatin 40 MG tablet  Commonly known as:  PRAVACHOL  Take 40 mg by mouth every evening.     traZODone 50 MG tablet  Commonly known as:  DESYREL  Take 50 mg by mouth at bedtime.        Relevant Imaging Results:  Relevant Lab Results:  Recent Labs    Additional Information    Karn CassisStultz, Aeralyn Barna [place], [place] [phone]

## 2015-06-07 DIAGNOSIS — I639 Cerebral infarction, unspecified: Secondary | ICD-10-CM | POA: Diagnosis not present

## 2015-06-07 LAB — BASIC METABOLIC PANEL
ANION GAP: 10 (ref 5–15)
BUN: 24 mg/dL — ABNORMAL HIGH (ref 6–20)
CHLORIDE: 97 mmol/L — AB (ref 101–111)
CO2: 31 mmol/L (ref 22–32)
Calcium: 8.8 mg/dL — ABNORMAL LOW (ref 8.9–10.3)
Creatinine, Ser: 0.62 mg/dL (ref 0.44–1.00)
GFR calc Af Amer: >60 mL/min (ref >60)
GLUCOSE: 159 mg/dL — AB (ref 65–99)
POTASSIUM: 3.5 mmol/L (ref 3.5–5.1)
Sodium: 138 mmol/L (ref 135–145)

## 2015-06-07 LAB — GLUCOSE, CAPILLARY
GLUCOSE-CAPILLARY: 157 mg/dL — AB (ref 65–99)
GLUCOSE-CAPILLARY: 194 mg/dL — AB (ref 65–99)
Glucose-Capillary: 180 mg/dL — ABNORMAL HIGH (ref 65–99)
Glucose-Capillary: 195 mg/dL — ABNORMAL HIGH (ref 65–99)

## 2015-06-07 MED ORDER — CLONIDINE HCL 0.2 MG PO TABS
0.2000 mg | ORAL_TABLET | Freq: Two times a day (BID) | ORAL | Status: DC
  Administered 2015-06-07 – 2015-06-10 (×5): 0.2 mg via ORAL
  Filled 2015-06-07 (×6): qty 1

## 2015-06-07 NOTE — Progress Notes (Signed)
Physical Therapy Treatment Patient Details Name: Tara GuyJane F Park MRN: [id number] DOB: Nov 30, 1945 Today's Date: 06/07/2015    History of Present Illness Pt is a 69 y.o. female with PMH of COPD, HTN, HLD, DM, continued tobacco abuse, COPD, who presented with left-sided weakness.  Patient reports that she started having sudden onset left-sided weakness when she was sitting in the chair at about 18:06. The symptoms lasted for about 15-20 minutes, then resolved spontaneously. At time of screening  on 11/9 all symptoms had resolved. Pt sustained a new CVA while admitted and is being evaluated to determine the extent of this new event.     PT Comments    Pt needed some encouragement before agreeing to work with me.  She continues to be on supplemental O2 at 4 L/min with O2 sat=94%.  She continues to have no function of the LUE with extensor tone present as well as fairly strong extensor tone in the LLE.  She is able to actively pull in the left quad during SAQ exercise.  She required less assist today with bed mobility and transfers and during pivot transfer to the chair was able to assume full weight bearing on both LEs.  She is still agreeable to SNF at d/c.  Follow Up Recommendations  SNF     Equipment Recommendations  None recommended by PT    Recommendations for Other Services OT consult     Precautions / Restrictions Precautions Precautions: Fall Restrictions Weight Bearing Restrictions: No    Mobility  Bed Mobility Overal bed mobility: Needs Assistance Bed Mobility: Supine to Sit     Supine to sit: Max assist;HOB elevated     General bed mobility comments: needs assist to move trunk and LEs to EOB  Transfers Overall transfer level: Needs assistance Equipment used: None Transfers: Sit to/from Stand Sit to Stand: Max assist         General transfer comment: pt was able to hug therapist with RUE and come to standing with assist...she was then able to slowly pivot both  LEs around to sit in recliner.  Ambulation/Gait             General Gait Details: not yet ready   Stairs            Wheelchair Mobility    Modified Rankin (Stroke Patients Only)       Balance Overall balance assessment: Needs assistance Sitting-balance support: Feet supported;Single extremity supported   Sitting balance - Comments: pt is now able to sit independently with RUE support (without any challenge)...if center of gravity shifts she falls to the left       Standing balance comment: unable to test                    Cognition Arousal/Alertness: Awake/alert Behavior During Therapy: WFL for tasks assessed/performed Overall Cognitive Status: Within Functional Limits for tasks assessed                      Exercises General Exercises - Lower Extremity Ankle Circles/Pumps: AROM;PROM;10 reps;Supine;Right;Left Short Arc QuadBarbaraann Park: AAROM;Left;10 reps;Supine Heel Slides: PROM;AROM;Right;Left;10 reps;Supine Hip ABduction/ADduction: PROM;AROM;Left;Right;10 reps;Supine Straight Leg Raises: AROM;Right;10 reps;Supine Hip Flexion/Marching: PROM;Left;Supine;10 reps    General Comments        Pertinent Vitals/Pain Pain Assessment: No/denies pain    Home Living                      Prior Function  PT Goals (current goals can now be found in the care plan section) Progress towards PT goals: Progressing toward goals    Frequency  Min 5X/week    PT Plan Current plan remains appropriate    Co-evaluation             End of Session Equipment Utilized During Treatment: Gait belt;Oxygen Activity Tolerance: Patient tolerated treatment well Patient left: in chair;with call bell/phone within reach     Time: 4098-1191 PT Time Calculation (min) (ACUTE ONLY): 44 min  Charges:  $Therapeutic Exercise: 8-22 mins $Therapeutic Activity: 8-22 mins                    G CodesKonrad Penta PT 06/07/2015, 4:37  PM [phone]

## 2015-06-07 NOTE — Progress Notes (Signed)
Patient ID: Tara Park, female   DOB: August 12, 1945, 69 y.o.   MRN: [id number]  [place] A. Merlene Laughter, MD     [email or web address]          Tara FEUERSTEIN is an 69 y.o. female.   Assessment/Plan:  The patient most likely had a TIA a couple days ago and now has progressed to a completed infarct unfortunately. Possibly embolic given bilateral infarctions.   Acute left subcortical infarct likely asymptomatic given the location.   Multiple bilateral lacunar infarcts.  Acute lacunar infarction. Risk factors DM, HTN, tobacco use, and age.  RECOMMENDATION: Suggest 30 day events monitor to check for AFIBB Agree with antiplatelet aspirin therapy. 325 mg daily as recommended. Add plavix short term 4 weeks Smoking cessation. Diabetes control. Blood pressure control. Stop Naproxen and NSAIDS - given the known risks to increase thromboembolic event such as stroke and heart attack.       The patient unfortunately has had significant progression of her left-sided symptoms over the last 2-3 days. She reports that she has not been able to move the left side for the last 2 nights. Swallowing is okay however. No dyspnea today.  GENERAL: She is acutely dyspneic.  HEENT: Supple. Atraumatic normocephalic.   ABDOMEN: soft  EXTREMITIES: There is marked pitting edema of the right distal leg and ankle with marked limitation of dorsiflexion and the plantarflexion of the ankle.  She had a fracture there and it is fused. Significant arthritic changes of the knees bilaterally.   BACK: Normal.  SKIN: Normal by inspection.   MENTAL STATUS: Alert and oriented. She does have mild dysarthria. There is flattening of the nasolabial fold on the left side.   CRANIAL NERVES: Pupils are equal, round and reactive to light and accommodation; extra ocular movements are full, there is no significant nystagmus; visual fields are full; There is mild left lower facial weakness; tongue is  midline; uvula is midline; shoulder elevation is normal.  MOTOR:Right side shows normal tone, bulk and strength. Left upper extremity 1/5 and the left leg 2/5.  COORDINATION: Left finger to nose is normal, right finger to nose is normal, No rest tremor; no intention tremor; no postural tremor; no bradykinesia.  REFLEXES: Deep tendon reflexes are symmetrical and normal. Babinski reflexes are flexor bilaterally.   SENSATION: Normal to light touch. Normal tactile and visual double simultaneous stimulation.  NIH stroke scale 2.     Objective: Vital signs in last 24 hours: Temp:  [97.7 F (36.5 C)-98.6 F (37 C)] 97.7 F (36.5 C) (11/15 1600) Pulse Rate:  [81-113] 104 (11/15 1600) Resp:  [20-22] 20 (11/15 1600) BP: (109-189)/(69-107) 130/80 mmHg (11/15 1600) SpO2:  [89 %-99 %] 98 % (11/15 1707)  Intake/Output from previous day: 11/14 0701 - 11/15 0700 In: 840 [P.O.:840] Out: 2175 [Urine:2175] Intake/Output this shift: Total I/O In: 240 [P.O.:240] Out: -  Nutritional status: DIET DYS 2 Room service appropriate?: Yes; Fluid consistency:: Honey Thick   Lab Results: Results for orders placed or performed during the hospital encounter of 05/31/15 (from the past 48 hour(s))  Blood gas, arterial     Status: Abnormal   Collection Time: 06/05/15  8:45 PM  Result Value Ref Range   O2 Content 3.0 L/min   Delivery systems NASAL CANNULA    pH, Arterial 7.453 (H) 7.350 - 7.450   pCO2 arterial 39.7 35.0 - 45.0 mmHg   pO2, Arterial 91.8 80.0 - 100.0 mmHg   Bicarbonate 27.6 (H) 20.0 - 24.0  mEq/L   TCO2 20.1 0 - 100 mmol/L   Acid-Base Excess 3.6 (H) 0.0 - 2.0 mmol/L   O2 Saturation 97.3 %   Patient temperature 37.0    Collection site RIGHT RADIAL    Drawn by [id number]    Sample type ARTERIAL    Allens test (pass/fail) PASS PASS  Glucose, capillary     Status: Abnormal   Collection Time: 06/05/15 10:06 PM  Result Value Ref Range   Glucose-Capillary 136 (H) 65 - 99 mg/dL  Basic  metabolic panel     Status: Abnormal   Collection Time: 06/06/15  6:32 AM  Result Value Ref Range   Sodium 139 135 - 145 mmol/L   Potassium 3.8 3.5 - 5.1 mmol/L   Chloride 103 101 - 111 mmol/L   CO2 29 22 - 32 mmol/L   Glucose, Bld 152 (H) 65 - 99 mg/dL   BUN 24 (H) 6 - 20 mg/dL   Creatinine, Ser 0.58 0.44 - 1.00 mg/dL   Calcium 8.6 (L) 8.9 - 10.3 mg/dL   GFR calc non Af Amer >60 >60 mL/min   GFR calc Af Amer >60 >60 mL/min    Comment: (NOTE) The eGFR has been calculated using the CKD EPI equation. This calculation has not been validated in all clinical situations. eGFR's persistently <60 mL/min signify possible Chronic Kidney Disease.    Anion gap 7 5 - 15  Glucose, capillary     Status: Abnormal   Collection Time: 06/06/15  8:00 AM  Result Value Ref Range   Glucose-Capillary 138 (H) 65 - 99 mg/dL   Comment 1 Notify RN   Glucose, capillary     Status: Abnormal   Collection Time: 06/06/15 12:12 PM  Result Value Ref Range   Glucose-Capillary 141 (H) 65 - 99 mg/dL   Comment 1 Notify RN   CBC with Differential/Platelet     Status: Abnormal   Collection Time: 06/06/15  1:45 PM  Result Value Ref Range   WBC 12.4 (H) 4.0 - 10.5 K/uL   RBC 5.85 (H) 3.87 - 5.11 MIL/uL   Hemoglobin 15.8 (H) 12.0 - 15.0 g/dL   HCT 46.4 (H) 36.0 - 46.0 %   MCV 79.3 78.0 - 100.0 fL   MCH 27.0 26.0 - 34.0 pg   MCHC 34.1 30.0 - 36.0 g/dL   RDW 13.9 11.5 - 15.5 %   Platelets 229 150 - 400 K/uL   Neutrophils Relative % 90 %   Neutro Abs 11.2 (H) 1.7 - 7.7 K/uL   Lymphocytes Relative 8 %   Lymphs Abs 1.0 0.7 - 4.0 K/uL   Monocytes Relative 2 %   Monocytes Absolute 0.2 0.1 - 1.0 K/uL   Eosinophils Relative 0 %   Eosinophils Absolute 0.0 0.0 - 0.7 K/uL   Basophils Relative 0 %   Basophils Absolute 0.0 0.0 - 0.1 K/uL  Glucose, capillary     Status: Abnormal   Collection Time: 06/06/15  4:45 PM  Result Value Ref Range   Glucose-Capillary 194 (H) 65 - 99 mg/dL   Comment 1 Notify RN   Glucose,  capillary     Status: Abnormal   Collection Time: 06/06/15  9:36 PM  Result Value Ref Range   Glucose-Capillary 162 (H) 65 - 99 mg/dL  Basic metabolic panel     Status: Abnormal   Collection Time: 06/07/15  6:50 AM  Result Value Ref Range   Sodium 138 135 - 145 mmol/L   Potassium 3.5 3.5 -  5.1 mmol/L   Chloride 97 (L) 101 - 111 mmol/L   CO2 31 22 - 32 mmol/L   Glucose, Bld 159 (H) 65 - 99 mg/dL   BUN 24 (H) 6 - 20 mg/dL   Creatinine, Ser 0.62 0.44 - 1.00 mg/dL   Calcium 8.8 (L) 8.9 - 10.3 mg/dL   GFR calc non Af Amer >60 >60 mL/min   GFR calc Af Amer >60 >60 mL/min    Comment: (NOTE) The eGFR has been calculated using the CKD EPI equation. This calculation has not been validated in all clinical situations. eGFR's persistently <60 mL/min signify possible Chronic Kidney Disease.    Anion gap 10 5 - 15  Glucose, capillary     Status: Abnormal   Collection Time: 06/07/15  8:02 AM  Result Value Ref Range   Glucose-Capillary 157 (H) 65 - 99 mg/dL  Glucose, capillary     Status: Abnormal   Collection Time: 06/07/15 11:45 AM  Result Value Ref Range   Glucose-Capillary 180 (H) 65 - 99 mg/dL  Glucose, capillary     Status: Abnormal   Collection Time: 06/07/15  4:15 PM  Result Value Ref Range   Glucose-Capillary 194 (H) 65 - 99 mg/dL    Lipid Panel No results for input(s): CHOL, TRIG, HDL, CHOLHDL, VLDL, LDLCALC in the last 72 hours.  Studies/Results: The patient had CT scan is reviewed in person. 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 Nothing acute is seen. There is also moderate global atrophy. No hemorrhages.   Medications:  Scheduled Meds: . aspirin  300 mg Rectal Daily   Or  . aspirin  325 mg Oral Daily  . cloNIDine  0.2 mg Oral BID  . clopidogrel  75 mg Oral Q breakfast  . dextromethorphan-guaiFENesin  1 tablet Oral BID  . digoxin  0.125 mg Oral Daily  . furosemide  20 mg Oral Daily  . heparin  5,000 Units Subcutaneous 3 times per  day  . insulin aspart  0-9 Units Subcutaneous TID WC  . ipratropium-albuterol  3 mL Nebulization Q4H WA  . isosorbide mononitrate  30 mg Oral Daily  . lisinopril  10 mg Oral Daily  . methylPREDNISolone (SOLU-MEDROL) injection  125 mg Intravenous Q6H  . pravastatin  40 mg Oral QPM  . traZODone  50 mg Oral QHS  . varenicline  1 mg Oral BID WC   Continuous Infusions:   PRN Meds:.albuterol, HYDROcodone-acetaminophen, LORazepam, senna-docusate    Critical care time 30mn  LOS: 7 days   Samariyah Cowles A. DMerlene Laughter M.D.  Diplomate, ATax adviserof Psychiatry and Neurology ( Neurology).

## 2015-06-07 NOTE — Care Management Note (Signed)
Case Management Note  Patient Details  Name: Tara GuyJane F Calo MRN: [id number] Date of Birth: 1946-04-03  Subjective/Objective:                    Action/Plan:   Expected Discharge Date:                  Expected Discharge Plan:  Home/Self Care  In-House Referral:  NA  Discharge planning Services  CM Consult  Post Acute Care Choice:  NA Choice offered to:  NA  DME Arranged:    DME Agency:     HH Arranged:    HH Agency:     Status of Service:  Completed, signed off  Medicare Important Message Given:  Yes Date Medicare IM Given:    Medicare IM give by:    Date Additional Medicare IM Given:    Additional Medicare Important Message give by:     If discussed at Long Length of Stay Meetings, dates discussed:06/07/15    Additional Comments:  Cheryl FlashBlackwell, Rexton Greulich Crowder, RN 06/07/2015, 1:49 PM

## 2015-06-07 NOTE — Clinical Social Work Placement (Signed)
   CLINICAL SOCIAL WORK PLACEMENT  NOTE  Date:  06/07/2015  Patient Details  Name: Tara GuyJane F Venard MRN: [id number] Date of Birth: 01-21-1946  Clinical Social Work is seeking post-discharge placement for this patient at the Skilled  Nursing Facility level of care (*CSW will initial, date and re-position this form in  chart as items are completed):  Yes   Patient/family provided with [facility] Clinical Social Work Department's list of facilities offering this level of care within the geographic area requested by the patient (or if unable, by the patient's family).  Yes   Patient/family informed of their freedom to choose among providers that offer the needed level of care, that participate in Medicare, Medicaid or managed care program needed by the patient, have an available bed and are willing to accept the patient.  Yes   Patient/family informed of [facility] ownership interest in [facility] and [facility], as well as of the fact that they are under no obligation to receive care at these facilities.  PASRR submitted to EDS on 06/06/15     PASRR number received on 06/06/15     Existing PASRR number confirmed on       FL2 transmitted to all facilities in geographic area requested by pt/family on 06/06/15     FL2 transmitted to all facilities within larger geographic area on       Patient informed that his/her managed care company has contracts with or will negotiate with certain facilities, including the following:            Patient/family informed of bed offers received.  Patient [facility]     Physician recommends and patient chooses bed at      Patient to [facility] on  .  Patient to be transferred to facility by       Patient family notified on   of transfer.  Name of family member notified:        PHYSICIAN       Additional Comment:    _______________________________________________ Karn CassisStultz, Iwalani Templeton  [place], LCSW 06/07/2015, 1:03 PM [phone]

## 2015-06-07 NOTE — Progress Notes (Signed)
PT Cancellation Note  Patient Details Name: Clint GuyJane F Netto MRN: [id number] DOB: 03-Dec-1945   Cancelled Treatment:    Reason Eval/Treat Not Completed: Patient at procedure or test/unavailable Attempted to complete physical therapy session.  While out of room to gather necessary material return to nursing in room.   Will attempt later today if available.    [address]Corayma Cashatt, LPTA; CBIS [phone]  Juel BurrowCockerham, Kemyah Buser Jo 06/07/2015, 1:41 PM

## 2015-06-07 NOTE — Progress Notes (Signed)
Respiratory therapy:  Extra prn tx given; patient congested and appears anxious.  Lauretta ChesterMike McDanielRRT

## 2015-06-07 NOTE — Progress Notes (Signed)
Patient continues with dense left hemiplegia and persistent bronchospasm although somewhat improved over the previous day early of mild to moderate intrauterine respiratory wheezes chest x-ray reveals no evidence of infiltrate early asthmatic and chronic bronchitis currently on IV steroids as well as nebulizer therapy Tara Park ZOX:[id number] DOB: 1945/12/12 DOA: 05/31/2015 PCP: [name],[name], MD             Physical Exam: Blood pressure 172/107, pulse 110, temperature 98.6 F (37 C), temperature source Oral, resp. rate 20, height 5\' 4"  (1.626 m), weight 139 lb 1.8 oz (63.1 kg), SpO2 90 %. lungs show diminished breath sounds in the bases scattered coarse rhonchi mild to moderate his return next 3 wheezes noted in all lung fields no rales audible heart regular rhythm no S3-S4 no heaves thrills rubs   Investigations:  Recent Results (from the past 240 hour(s))  Culture, blood (routine x 2) Call MD if unable to obtain prior to antibiotics being given     Status: None   Collection Time: 05/31/15 10:51 PM  Result Value Ref Range Status   Specimen Description BLOOD LEFT ARM  Final   Special Requests BOTTLES DRAWN AEROBIC AND ANAEROBIC 6CC  Final   Culture NO GROWTH 5 DAYS  Final   Report Status 06/05/2015 FINAL  Final  Culture, blood (routine x 2) Call MD if unable to obtain prior to antibiotics being given     Status: None   Collection Time: 05/31/15 10:55 PM  Result Value Ref Range Status   Specimen Description RIGHT ANTECUBITAL  Final   Special Requests BOTTLES DRAWN AEROBIC ONLY 6CC  Final   Culture NO GROWTH 5 DAYS  Final   Report Status 06/05/2015 FINAL  Final  Culture, sputum-assessment     Status: None   Collection Time: 06/01/15  2:16 PM  Result Value Ref Range Status   Specimen Description SPU  Final   Special Requests NONE  Final   Sputum evaluation   Final    THIS SPECIMEN IS ACCEPTABLE FOR SPUTUM CULTURE PERFORMED AT [facility]    Report Status 06/01/2015 FINAL   Final  Culture, respiratory (NON-Expectorated)     Status: None   Collection Time: 06/01/15  2:20 PM  Result Value Ref Range Status   Specimen Description SPUTUM  Final   Special Requests NONE  Final   Gram Stain   Final    RARE WBC PRESENT, PREDOMINANTLY PMN FEW SQUAMOUS EPITHELIAL CELLS PRESENT MODERATE GRAM POSITIVE COCCI IN PAIRS IN CHAINS IN CLUSTERS RARE GRAM NEGATIVE RODS Performed at [facility]    Culture   Final    NORMAL OROPHARYNGEAL FLORA Performed at [facility]    Report Status 06/04/2015 FINAL  Final     Basic Metabolic Panel:  Recent Labs  81/19/1411/14/16 0632 06/07/15 0650  NA 139 138  K 3.8 3.5  CL 103 97*  CO2 29 31  GLUCOSE 152* 159*  BUN 24* 24*  CREATININE 0.58 0.62  CALCIUM 8.6* 8.8*   Liver Function Tests: No results for input(s): AST, ALT, ALKPHOS, BILITOT, PROT, ALBUMIN in the last 72 hours.   CBC:  Recent Labs  06/06/15 1345  WBC 12.4*  NEUTROABS 11.2*  HGB 15.8*  HCT 46.4*  MCV 79.3  PLT 229    Dg Chest Port 1 View  06/06/2015  CLINICAL DATA:  Short of breath.  Wheezing.  History of COPD. EXAM: PORTABLE CHEST 1 VIEW COMPARISON:  06/05/2015 FINDINGS: Cardiac silhouette is normal in size and configuration.  No mediastinal or hilar masses or evidence of adenopathy. Prominent interstitial markings most evident at the lung bases is similar to the prior exam. There are no areas of lung consolidation. No pleural effusion or pneumothorax. Bony thorax is demineralized but intact. IMPRESSION: 1. Prominent interstitial markings mostly in the lung bases. This is most likely due to chronic bronchitic change. There is no evidence of pneumonia or pulmonary edema. No change from the recent prior study. Electronically Signed   By: Amie Portland M.D.   On: 06/06/2015 13:18      Medications:   Impression:  Principal Problem:   Acute ischemic stroke (HCC) Active Problems:   Tobacco abuse   Diabetes mellitus without complication  (HCC)   Hypertension   COPD exacerbation (HCC)     Plan: Continue IV steroids nebulizer therapy physical therapy will need several weeks rehabilitation stay for physical therapy   Consultants: Neurology physical therpy   Procedures   Antibiotics:                   Code Status:   Family Communication:    Disposition Plan see plan above  Time spent: 30 minutes   LOS: 7 days   [name]   06/07/2015, 12:19 PM

## 2015-06-08 ENCOUNTER — Inpatient Hospital Stay (HOSPITAL_COMMUNITY): Payer: Medicare Other

## 2015-06-08 LAB — GLUCOSE, CAPILLARY
GLUCOSE-CAPILLARY: 228 mg/dL — AB (ref 65–99)
Glucose-Capillary: 166 mg/dL — ABNORMAL HIGH (ref 65–99)
Glucose-Capillary: 149 mg/dL — ABNORMAL HIGH (ref 65–99)
Glucose-Capillary: 172 mg/dL — ABNORMAL HIGH (ref 65–99)

## 2015-06-08 MED ORDER — RESOURCE THICKENUP CLEAR PO POWD
ORAL | Status: DC | PRN
  Administered 2015-06-09: 06:00:00 via ORAL
  Filled 2015-06-08: qty 125

## 2015-06-08 NOTE — Progress Notes (Signed)
MBS report now available under imaging section of the notes.   Teagan Ozawa Sumney MA, CCC-SLP Acute Care Speech Language Pathologist    

## 2015-06-08 NOTE — Progress Notes (Addendum)
Patient ID: Tara Park, female   DOB: 06/01/1946, 69 y.o.   MRN: [id number]  [place] A. Merlene Laughter, MD     [email or web address]          Tara Park is an 68 y.o. female.   Assessment/Plan:  The patient most likely had a TIA a couple days ago and now has progressed to a completed infarct unfortunately. Possibly embolic given bilateral infarctions.   Acute left subcortical infarct likely asymptomatic given the location.   Multiple bilateral lacunar infarcts.  Acute lacunar infarction. Risk factors DM, HTN, tobacco use, and age.  RECOMMENDATION: Suggest 30 day events monitor to check for AFIBB Agree with antiplatelet aspirin therapy. 325 mg daily as recommended. Add plavix short term 4 weeks Smoking cessation. Diabetes control. Blood pressure control. Stop Naproxen and NSAIDS - given the known risks to increase thromboembolic event such as stroke and heart attack.       NO new complaints   GENERAL: She is acutely dyspneic.  HEENT: Supple. Atraumatic normocephalic.   ABDOMEN: soft  EXTREMITIES: There is marked pitting edema of the right distal leg and ankle with marked limitation of dorsiflexion and the plantarflexion of the ankle.  She had a fracture there and it is fused. Significant arthritic changes of the knees bilaterally.   BACK: Normal.  SKIN: Normal by inspection.   MENTAL STATUS: Alert and oriented. She does have mild dysarthria. There is flattening of the nasolabial fold on the left side.   CRANIAL NERVES: Pupils are equal, round and reactive to light and accommodation; extra ocular movements are full, there is no significant nystagmus; visual fields are full; There is mild left lower facial weakness; tongue is midline; uvula is midline; shoulder elevation is normal.  MOTOR:Right side shows normal tone, bulk and strength. Left upper extremity 0/5 and the left leg 1/5.  COORDINATION: Left finger to nose is normal, right finger  to nose is normal, No rest tremor; no intention tremor; no postural tremor; no bradykinesia.  REFLEXES: Deep tendon reflexes are symmetrical and normal. Babinski reflexes are flexor bilaterally.   SENSATION: Normal to light touch. Normal tactile and visual double simultaneous stimulation.  NIH stroke scale 2.     Objective: Vital signs in last 24 hours: Temp:  [97.4 F (36.3 C)-98.4 F (36.9 C)] 97.4 F (36.3 C) (11/16 1200) Pulse Rate:  [87-98] 98 (11/16 1200) Resp:  [16-18] 16 (11/16 1200) BP: (86-132)/(48-71) 86/56 mmHg (11/16 1200) SpO2:  [89 %-100 %] 92 % (11/16 1522)  Intake/Output from previous day: 11/15 0701 - 11/16 0700 In: 240 [P.O.:240] Out: 200 [Urine:200] Intake/Output this shift:   Nutritional status: DIET DYS 2 Room service appropriate?: Yes; Fluid consistency:: Honey Thick   Lab Results: Results for orders placed or performed during the hospital encounter of 05/31/15 (from the past 48 hour(s))  Glucose, capillary     Status: Abnormal   Collection Time: 06/06/15  4:45 PM  Result Value Ref Range   Glucose-Capillary 194 (H) 65 - 99 mg/dL   Comment 1 Notify RN   Glucose, capillary     Status: Abnormal   Collection Time: 06/06/15  9:36 PM  Result Value Ref Range   Glucose-Capillary 162 (H) 65 - 99 mg/dL  Basic metabolic panel     Status: Abnormal   Collection Time: 06/07/15  6:50 AM  Result Value Ref Range   Sodium 138 135 - 145 mmol/L   Potassium 3.5 3.5 - 5.1 mmol/L   Chloride 97 (L) 101 -  111 mmol/L   CO2 31 22 - 32 mmol/L   Glucose, Bld 159 (H) 65 - 99 mg/dL   BUN 24 (H) 6 - 20 mg/dL   Creatinine, Ser 0.62 0.44 - 1.00 mg/dL   Calcium 8.8 (L) 8.9 - 10.3 mg/dL   GFR calc non Af Amer >60 >60 mL/min   GFR calc Af Amer >60 >60 mL/min    Comment: (NOTE) The eGFR has been calculated using the CKD EPI equation. This calculation has not been validated in all clinical situations. eGFR's persistently <60 mL/min signify possible Chronic Kidney Disease.     Anion gap 10 5 - 15  Glucose, capillary     Status: Abnormal   Collection Time: 06/07/15  8:02 AM  Result Value Ref Range   Glucose-Capillary 157 (H) 65 - 99 mg/dL  Glucose, capillary     Status: Abnormal   Collection Time: 06/07/15 11:45 AM  Result Value Ref Range   Glucose-Capillary 180 (H) 65 - 99 mg/dL  Glucose, capillary     Status: Abnormal   Collection Time: 06/07/15  4:15 PM  Result Value Ref Range   Glucose-Capillary 194 (H) 65 - 99 mg/dL  Glucose, capillary     Status: Abnormal   Collection Time: 06/07/15  8:25 PM  Result Value Ref Range   Glucose-Capillary 195 (H) 65 - 99 mg/dL   Comment 1 Notify RN    Comment 2 Document in Chart   Glucose, capillary     Status: Abnormal   Collection Time: 06/08/15  7:13 AM  Result Value Ref Range   Glucose-Capillary 166 (H) 65 - 99 mg/dL   Comment 1 Notify RN   Glucose, capillary     Status: Abnormal   Collection Time: 06/08/15 11:42 AM  Result Value Ref Range   Glucose-Capillary 149 (H) 65 - 99 mg/dL   Comment 1 Notify RN     Lipid Panel No results for input(s): CHOL, TRIG, HDL, CHOLHDL, VLDL, LDLCALC in the last 72 hours.  Studies/Results: The patient had CT scan is reviewed in person. 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 Nothing acute is seen. There is also moderate global atrophy. No hemorrhages.   Medications:  Scheduled Meds: . aspirin  300 mg Rectal Daily   Or  . aspirin  325 mg Oral Daily  . cloNIDine  0.2 mg Oral BID  . clopidogrel  75 mg Oral Q breakfast  . dextromethorphan-guaiFENesin  1 tablet Oral BID  . digoxin  0.125 mg Oral Daily  . furosemide  20 mg Oral Daily  . heparin  5,000 Units Subcutaneous 3 times per day  . insulin aspart  0-9 Units Subcutaneous TID WC  . ipratropium-albuterol  3 mL Nebulization Q4H WA  . isosorbide mononitrate  30 mg Oral Daily  . lisinopril  10 mg Oral Daily  . methylPREDNISolone (SOLU-MEDROL) injection  125 mg Intravenous Q6H  .  pravastatin  40 mg Oral QPM  . traZODone  50 mg Oral QHS  . varenicline  1 mg Oral BID WC   Continuous Infusions:   PRN Meds:.albuterol, HYDROcodone-acetaminophen, LORazepam, senna-docusate    Critical care time 66mn  LOS: 8 days   Ebunoluwa Gernert A. DMerlene Laughter M.D.  Diplomate, ATax adviserof Psychiatry and Neurology ( Neurology).

## 2015-06-08 NOTE — Progress Notes (Signed)
Speech Pathology  MBSS scheduled for today at 3:30 PM.  Thank you,  Havery MorosDabney Porter, CCC-SLP [phone]

## 2015-06-08 NOTE — Progress Notes (Signed)
Patient is residual bronchospastic component of her severe COPD currently still maintained on Solu-Medrol 125 every 6 and DuoNeb every 4 hours while awake intermittent albuterol to supplement TSH reveals no evidence of infiltrate she has dense left hemiplegia and is undergoing physical therapy will require rehabilitation stay Tara GuyJane F Park ZOX:[id number] DOB: 1946/01/03 DOA: 05/31/2015 PCP: [name],Leocadio Heal M, MD             Physical Exam: Blood pressure 86/56, pulse 98, temperature 97.4 F (36.3 C), temperature source Axillary, resp. rate 16, height 5\' 4"  (1.626 m), weight 139 lb 1.8 oz (63.1 kg), SpO2 96 %. lungs show mild to moderate return x-ray wheezes scattered coarse rhonchi no rales audible heart regular rhythm no S3 or S4 no heaves thrills rubs abdomen soft nontender bowel sounds normoactive   Investigations:  Recent Results (from the past 240 hour(s))  Culture, blood (routine x 2) Call MD if unable to obtain prior to antibiotics being given     Status: None   Collection Time: 05/31/15 10:51 PM  Result Value Ref Range Status   Specimen Description BLOOD LEFT ARM  Final   Special Requests BOTTLES DRAWN AEROBIC AND ANAEROBIC 6CC  Final   Culture NO GROWTH 5 DAYS  Final   Report Status 06/05/2015 FINAL  Final  Culture, blood (routine x 2) Call MD if unable to obtain prior to antibiotics being given     Status: None   Collection Time: 05/31/15 10:55 PM  Result Value Ref Range Status   Specimen Description RIGHT ANTECUBITAL  Final   Special Requests BOTTLES DRAWN AEROBIC ONLY 6CC  Final   Culture NO GROWTH 5 DAYS  Final   Report Status 06/05/2015 FINAL  Final  Culture, sputum-assessment     Status: None   Collection Time: 06/01/15  2:16 PM  Result Value Ref Range Status   Specimen Description SPU  Final   Special Requests NONE  Final   Sputum evaluation   Final    THIS SPECIMEN IS ACCEPTABLE FOR SPUTUM CULTURE PERFORMED AT [facility]    Report Status 06/01/2015 FINAL  Final   Culture, respiratory (NON-Expectorated)     Status: None   Collection Time: 06/01/15  2:20 PM  Result Value Ref Range Status   Specimen Description SPUTUM  Final   Special Requests NONE  Final   Gram Stain   Final    RARE WBC PRESENT, PREDOMINANTLY PMN FEW SQUAMOUS EPITHELIAL CELLS PRESENT MODERATE GRAM POSITIVE COCCI IN PAIRS IN CHAINS IN CLUSTERS RARE GRAM NEGATIVE RODS [facility]formed at [facility]    Culture   Final    NORMAL OROPHARYNGEAL FLORA Performed at [facility]    Report Status 06/04/2015 FINAL  Final     Basic Metabolic Panel:  Recent Labs  81/19/1411/14/16 0632 06/07/15 0650  NA 139 138  K 3.8 3.5  CL 103 97*  CO2 29 31  GLUCOSE 152* 159*  BUN 24* 24*  CREATININE 0.58 0.62  CALCIUM 8.6* 8.8*   Liver Function Tests: No results for input(s): AST, ALT, ALKPHOS, BILITOT, PROT, ALBUMIN in the last 72 hours.   CBC:  Recent Labs  06/06/15 1345  WBC 12.4*  NEUTROABS 11.2*  HGB 15.8*  HCT 46.4*  MCV 79.3  PLT 229    Dg Chest Port 1 View  06/06/2015  CLINICAL DATA:  Short of breath.  Wheezing.  History of COPD. EXAM: PORTABLE CHEST 1 VIEW COMPARISON:  06/05/2015 FINDINGS: Cardiac silhouette is normal in size and configuration. No mediastinal  or hilar masses or evidence of adenopathy. Prominent interstitial markings most evident at the lung bases is similar to the prior exam. There are no areas of lung consolidation. No pleural effusion or pneumothorax. Bony thorax is demineralized but intact. IMPRESSION: 1. Prominent interstitial markings mostly in the lung bases. This is most likely due to chronic bronchitic change. There is no evidence of pneumonia or pulmonary edema. No change from the recent prior study. Electronically Signed   By: Amie Portland M.D.   On: 06/06/2015 13:18      Medications:   Impression:  Principal Problem:   Acute ischemic stroke [facility]) Active Problems:   Tobacco abuse   Diabetes mellitus without complication (HCC)    Hypertension   COPD exacerbation (HCC)     Plan: Continue Solu-Medrol 125 IV every 6 hours continue DuoNeb nebulizer every 4 hours while awake additional supplemental albuterol physical therapy   Consultants: Neurology   Procedures   Antibiotics:                   Code Status: Full  Family Communication:    Disposition Plan see plan above  Time spent: 30 minutes   LOS: 8 days   Teleshia Lemere M   06/08/2015, 12:31 PM

## 2015-06-08 NOTE — Progress Notes (Signed)
.Physical Therapy Treatment Patient Details Name: Tara Park MRN: [id number] DOB: [birth year] Today's Date: 06/08/2015    History of Present Illness Pt is a 69 y.o. female with PMH of COPD, HTN, HLD, DM, continued tobacco abuse, COPD, who presented with left-sided weakness.  Patient reports that she started having sudden onset left-sided weakness when she was sitting in the chair at about 18:06. The symptoms lasted for about 15-20 minutes, then resolved spontaneously. At time of screening  on 11/9 all symptoms had resolved. Pt sustained a new CVA while admitted and is being evaluated to determine the extent of this new event.     PT Comments    Pt tolerating treatment session well, motivated and able to complete entire PT sesssion as planned. Pt continues to make progress toward goals as evidenced by improved tolerance toward activities and exercise. Hypertonia on LUE presenting with mild to moderate improvement today, as is true with LLE, however ability to activate muscles in LLE seems more limited. Pt's greatest limitation continues to be L sided hemiplegia which continues to limit ability to perform unsupported sitting without losing balance and falling over. Patient presenting with impairment of strength, O2 perfusion, range of motion, balance, and activity tolerance, limiting ability to perform ADL and mobility tasks at  baseline level of function. Patient will benefit from skilled intervention to address the above impairments and limitations, in order to restore to prior level of function, improve patient safety upon discharge, and to decrease caregiver burden.    Follow Up Recommendations  SNF     Equipment Recommendations  None recommended by PT    Recommendations for Other Services OT consult     Precautions / Restrictions Precautions Precautions: Fall Restrictions Weight Bearing Restrictions: No    Mobility  Bed Mobility Overal bed mobility: Needs Assistance;+2 for  physical assistance Bed Mobility: Supine to Sit     Supine to sit: Max assist;HOB elevated        Transfers Overall transfer level: Needs assistance Equipment used: None Transfers: Stand Pivot Transfers;Sit to/from Stand Sit to Stand: Max assist Stand pivot transfers: Max assist       General transfer comment: pt was able to hug therapist with RUE and come to standing with assist..pt requires assistance mobilizing LLE during transfer.  Ambulation/Gait             General Gait Details: not yet ready   Stairs            Wheelchair Mobility    Modified Rankin (Stroke Patients Only)       Balance     Sitting balance-Leahy Scale: Zero Sitting balance - Comments: Practiced rocking from side to side with Max assistance, 20x bilat; practiced reaching toward floor and ceiling, alternating and leading with LUE 20x each way.                             Cognition Arousal/Alertness: Awake/alert;Lethargic Behavior During Therapy: WFL for tasks assessed/performed Overall Cognitive Status: Within Functional Limits for tasks assessed                      Exercises General Exercises - Lower Extremity Ankle Circles/Pumps: Left;15 reps;Seated (stretching of plantar flexors) Long Arc Quad: Seated;Both;20 reps (AAROM on L TotalA; Supervision on R ) Hip Flexion/Marching: Both;20 reps;Seated (AAROM on L TotalA; MinA on R )    General Comments        Pertinent Vitals/Pain  Pain Assessment: No/denies pain    Home Living                      Prior Function            PT Goals (current goals can now be found in the care plan section) Acute Rehab PT Goals Patient Stated Goal: Pt wants to get her strength back and go to rehab.  PT Goal Formulation: With patient Time For Goal Achievement: 06/19/15 Potential to Achieve Goals: Fair Progress towards PT goals: Progressing toward goals    Frequency  Min 5X/week    PT Plan Current plan  remains appropriate    Co-evaluation             End of Session Equipment Utilized During Treatment: Gait belt;Oxygen Activity Tolerance: Patient tolerated treatment well;Patient limited by fatigue (Still c/o difficulty breathing; coughing is imporved sicne sunday, but still 90% limited. ) Patient left: in chair;with call bell/phone within reach     Time: 1111-1135 PT Time Calculation (min) (ACUTE ONLY): 24 min  Charges:  $Therapeutic Activity: 23-37 mins                    G Codes:      Buccola,Allan C 06/08/2015, 12:08 PM  12:11 PM  Rosamaria LintsAllan C Buccola, PT, DPT [place] License # [id number]

## 2015-06-08 NOTE — Progress Notes (Signed)
Speech Language Pathology Dysphagia Treatment Patient Details Name: Tara Park MRN: [id number] DOB: August 03, 1945 Today's Date: 06/08/2015 Time: [id number] SLP Time Calculation (min) (ACUTE ONLY): 15 min  Assessment / Plan / Recommendation Clinical Impression   Pt seen sitting up in chair in room with lunch tray nearby. Pt reports adequate tolerance of diet and states she doesn't mind the honey thick liquids. Pt has been consuming HTL via straw sips, so pt observed with straw today. Dense hemiplegia persists, however pt able to control thickened liquids orally. Suspect delay in swallow initiation. No overt coughing/throat clearing this date. Pt would benefit from objective test via MBSS prior to transfer to SNF. Will try to arrange for later today/tomorrow.     Diet Recommendation   D2 with Honey-thick liquids   SLP Plan Continue with current plan of care   Pertinent Vitals/Pain VSS   Swallowing Goals     General Behavior/Cognition: Alert;Cooperative;Pleasant mood Patient Positioning: Upright in chair/Tumbleform Oral care provided: N/A (pt consuming lunch) HPI: Pt is a 69 y.o. female with PMH of COPD, hypertension, hyperlipidemia, diabetes mellitus, tobacco abuse, COPD, who presented with left-sided weakness. 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 Pt was initially evaluated by SLP on 06-01-15 subsequentely without associated swallow deficits. Since then pt with recurrent onset of stroke symptoms and exhibited notable left sided weakness, dysarthria, and dysphagia. CT of head on 06-04-15 revealed acute, nonhemorrhagic infarct in the right putamen and corona radiata.       Dysphagia Treatment Treatment focused on: Skilled observation of diet tolerance;Patient/family/caregiver education;Utilization of compensatory strategies Treatment Methods: Skilled observation;Differential diagnosis;Compensation strategy  training;Patient/caregiver education Patient observed directly with PO's: Yes Type of PO's observed: Honey-thick liquids Feeding: Needs assist Liquids provided via: Straw Oral Phase Signs & Symptoms:  (adequate oral containment despite weakness) Pharyngeal Phase Signs & Symptoms: Suspected delayed swallow initiation;Multiple swallows Type of cueing: Verbal;Tactile Amount of cueing: Moderate   Thank you,  Havery MorosDabney Tulsi Crossett, CCC-SLP [phone]       Tara Park 06/08/2015, 1:08 PM

## 2015-06-09 LAB — BASIC METABOLIC PANEL
ANION GAP: 9 (ref 5–15)
BUN: 47 mg/dL — ABNORMAL HIGH (ref 6–20)
CALCIUM: 8.1 mg/dL — AB (ref 8.9–10.3)
CO2: 30 mmol/L (ref 22–32)
CREATININE: 0.76 mg/dL (ref 0.44–1.00)
Chloride: 96 mmol/L — ABNORMAL LOW (ref 101–111)
Glucose, Bld: 181 mg/dL — ABNORMAL HIGH (ref 65–99)
Potassium: 3.3 mmol/L — ABNORMAL LOW (ref 3.5–5.1)
SODIUM: 135 mmol/L (ref 135–145)

## 2015-06-09 LAB — CBC WITH DIFFERENTIAL/PLATELET
BASOS ABS: 0 10*3/uL (ref 0.0–0.1)
Basophils Relative: 0 %
EOS ABS: 0 10*3/uL (ref 0.0–0.7)
Eosinophils Relative: 0 %
HEMATOCRIT: 39.5 % (ref 36.0–46.0)
HEMOGLOBIN: 13.5 g/dL (ref 12.0–15.0)
LYMPHS PCT: 4 %
Lymphs Abs: 0.8 10*3/uL (ref 0.7–4.0)
MCH: 26.7 pg (ref 26.0–34.0)
MCHC: 34.2 g/dL (ref 30.0–36.0)
MCV: 78.1 fL (ref 78.0–100.0)
MONO ABS: 1.2 10*3/uL — AB (ref 0.1–1.0)
Monocytes Relative: 6 %
NEUTROS PCT: 90 %
Neutro Abs: 17.5 10*3/uL — ABNORMAL HIGH (ref 1.7–7.7)
Platelets: 239 10*3/uL (ref 150–400)
RBC: 5.06 MIL/uL (ref 3.87–5.11)
RDW: 13.7 % (ref 11.5–15.5)
WBC: 19.5 10*3/uL — AB (ref 4.0–10.5)

## 2015-06-09 LAB — GLUCOSE, CAPILLARY
GLUCOSE-CAPILLARY: 177 mg/dL — AB (ref 65–99)
GLUCOSE-CAPILLARY: 207 mg/dL — AB (ref 65–99)
GLUCOSE-CAPILLARY: 219 mg/dL — AB (ref 65–99)
Glucose-Capillary: 151 mg/dL — ABNORMAL HIGH (ref 65–99)

## 2015-06-09 MED ORDER — METHYLPREDNISOLONE SODIUM SUCC 125 MG IJ SOLR
80.0000 mg | Freq: Four times a day (QID) | INTRAMUSCULAR | Status: DC
  Administered 2015-06-09: 20:00:00 via INTRAVENOUS
  Administered 2015-06-10 (×3): 80 mg via INTRAVENOUS
  Filled 2015-06-09 (×3): qty 2

## 2015-06-09 MED ORDER — POTASSIUM CHLORIDE 10 MEQ/100ML IV SOLN
10.0000 meq | INTRAVENOUS | Status: AC
  Administered 2015-06-09 (×2): 10 meq via INTRAVENOUS
  Filled 2015-06-09 (×2): qty 100

## 2015-06-09 NOTE — Progress Notes (Signed)
Bronchospasm significantly improved over yesterday we'll diminish Solu-Medrol to 80 IV every 8 hours continue aggressive nebulizer therapy potassium 3.3 we will replenish IV Tara Park RUE:[id number] DOB: 1945-10-21 DOA: 05/31/2015 PCP: Isabella Stalling, MD             Physical Exam: Blood pressure 125/66, pulse 106, temperature 97.5 F (36.4 C), temperature source Axillary, resp. rate 20, height  (1.626 m), weight 139 lb 1.8 oz (63.1 kg), SpO2 91 %. lungs show mild inspiratory and extremity wheezes diminished breath sounds in the bases no rales audible scattered rhonchi all lung fields heart regular rhythm no S3-S4 no heaves thrills rubs abdomen soft nontender bowel sounds normoactive patient remained with persistent dense left hemiplegia   Investigations:  Recent Results (from the past 240 hour(s))  Culture, blood (routine x 2) Call MD if unable to obtain prior to antibiotics being given     Status: None   Collection Time: 05/31/15 10:51 PM  Result Value Ref Range Status   Specimen Description BLOOD LEFT ARM  Final   Special Requests BOTTLES DRAWN AEROBIC AND ANAEROBIC 6CC  Final   Culture NO GROWTH 5 DAYS  Final   Report Status 06/05/2015 FINAL  Final  Culture, blood (routine x 2) Call MD if unable to obtain prior to antibiotics being given     Status: None   Collection Time: 05/31/15 10:55 PM  Result Value Ref Range Status   Specimen Description RIGHT ANTECUBITAL  Final   Special Requests BOTTLES DRAWN AEROBIC ONLY 6CC  Final   Culture NO GROWTH 5 DAYS  Final   Report Status 06/05/2015 FINAL  Final  Culture, sputum-assessment     Status: None   Collection Time: 06/01/15  2:16 PM  Result Value Ref Range Status   Specimen Description SPU  Final   Special Requests NONE  Final   Sputum evaluation   Final    THIS SPECIMEN IS ACCEPTABLE FOR SPUTUM CULTURE PERFORMED AT [facility]    Report Status 06/01/2015 FINAL  Final  Culture, respiratory (NON-Expectorated)      Status: None   Collection Time: 06/01/15  2:20 PM  Result Value Ref Range Status   Specimen Description SPUTUM  Final   Special Requests NONE  Final   Gram Stain   Final    RARE WBC PRESENT, PREDOMINANTLY PMN FEW SQUAMOUS EPITHELIAL CELLS PRESENT MODERATE GRAM POSITIVE COCCI IN PAIRS IN CHAINS IN CLUSTERS RARE GRAM NEGATIVE RODS Performed at [facility]    Culture   Final    NORMAL OROPHARYNGEAL FLORA Performed at [facility]    Report Status 06/04/2015 FINAL  Final     Basic Metabolic Panel:  Recent Labs  14/78/29 0650 06/09/15 0620  NA 138 135  K 3.5 3.3*  CL 97* 96*  CO2 31 30  GLUCOSE 159* 181*  BUN 24* 47*  CREATININE 0.62 0.76  CALCIUM 8.8* 8.1*   Liver Function Tests: No results for input(s): AST, ALT, ALKPHOS, BILITOT, PROT, ALBUMIN in the last 72 hours.   CBC:  Recent Labs  06/06/15 1345 06/09/15 0620  WBC 12.4* 19.5*  NEUTROABS 11.2* 17.5*  HGB 15.8* 13.5  HCT 46.4* 39.5  MCV 79.3 78.1  PLT 229 239    Dg Swallowing Func-speech Pathology  06/08/2015  Objective Swallowing Evaluation:   Patient Details Name: Tara Park MRN: [id number] Date of Birth: Aug 24, 1945 Today's Date: 06/08/2015 Time: SLP Start Time (ACUTE ONLY): 1546-SLP Stop Time (ACUTE ONLY): 1606 SLP Time Calculation (min) (  ACUTE ONLY): 20 min Past Medical History: Past Medical History Diagnosis Date . Diabetes mellitus without complication (HCC)  . Hypertension  . Tobacco abuse  . Use of cane as ambulatory aid  . Leg edema, right    chronic Past Surgical History: Past Surgical History Procedure Laterality Date . Cataract extraction   . Hip surgery   . Foot surgery   HPI: Pt is a 69 y.o. female with PMH of COPD, hypertension, hyperlipidemia, diabetes mellitus, tobacco abuse, COPD, who presented with left-sided weakness. 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 Pt was initially evaluated by SLP on 06-01-15  subsequentely without associated swallow deficits. Since then pt with recurrent onset of stroke symptoms and exhibited notable left sided weakness, dysarthria, and dysphagia. CT of head on 06-04-15 revealed acute, nonhemorrhagic infarct in the right putamen and corona radiata.  No Data Recorded Assessment / Plan / Recommendation CHL IP CLINICAL IMPRESSIONS 06/08/2015 Therapy Diagnosis Moderate oral phase dysphagia;Mild pharyngeal phase dysphagia Clinical Impression Pt presents with sensory motor moderate oral and mild pharyngeal dysphagia of neurogenic etiology. Weak lingual manipulation, left sided sulci pocketting, and reduced timely AP transfer resulted in premature spillage and delay in swallow initiation across all consistencies. Pt with consistent deep penetration of thin liquids via teaspoon, cup, and straw to the level of the true vocal cords with one suspected episode of aspiration which was sensed with overt reflexive cough by the patient. (Initial presentation of thin via teaspoon with limited visibility). Aspiration noted with barium tablet and thin liquids via cup, which was sensed with overt coughing but not effectively cleared. Penetration of thin liquids also evidenced after the swallow secondary to oral residuals spilling to valleculae, and into larygneal vestibule. Nectar thick liquid trials yielded flash penetration and no observable aspiration. Pt requests puree diet given oral weakness and fatigue with mechanical soft and chopped consistencies. Recommend conservative diet implementation of nectar thick liquids and dysphagia 1 (puree) consistencies given history of decreased respiratory support and previous clinically observed increased signs and symptoms of reduced airway protection with prolonged PO trials and meals. Treating SLP at next level of care to upgrade as clinically appropriate.       CHL IP TREATMENT RECOMMENDATION 06/08/2015 Treatment Recommendations Defer treatment plan to f/u with  SLP   CHL IP DIET RECOMMENDATION 06/08/2015 SLP Diet Recommendations Dysphagia 1 (Puree) solids;Nectar thick liquid Liquid Administration via Cup;Straw Medication Administration Whole meds with puree Compensations Slow rate;Small sips/bites;Multiple dry swallows after each bite/sip;Minimize environmental distractions;Lingual sweep for clearance of pocketing;Follow solids with liquid Postural Changes Seated upright at 90 degrees;Remain semi-upright after after feeds/meals (Comment)   CHL IP OTHER RECOMMENDATIONS 06/08/2015 Recommended Consults -- Oral Care Recommendations Oral care BID Other Recommendations Order thickener from pharmacy   CHL IP FOLLOW UP RECOMMENDATIONS 06/08/2015 Follow up Recommendations Skilled Nursing facility   [facility] IP FREQUENCY AND DURATION 06/01/2015 Speech Therapy Frequency (ACUTE ONLY) min 2x/week Treatment Duration 1 week      CHL IP ORAL PHASE 06/08/2015 Oral Phase Impaired Oral - Pudding Teaspoon -- Oral - Pudding Cup -- Oral - Honey Teaspoon -- Oral - Honey Cup -- Oral - Nectar Teaspoon Weak lingual manipulation;Reduced posterior propulsion;Left pocketing in lateral sulci;Delayed oral transit;Premature spillage Oral - Nectar Cup Weak lingual manipulation;Left pocketing in lateral sulci;Reduced posterior propulsion;Delayed oral transit;Premature spillage Oral - Nectar Straw Weak lingual manipulation;Reduced posterior propulsion;Left pocketing in lateral sulci;Delayed oral transit;Premature spillage Oral - Thin Teaspoon Weak lingual  manipulation;Delayed oral transit;Reduced posterior propulsion;Left pocketing in lateral sulci;Lingual/palatal residue;Premature spillage Oral - Thin Cup Weak lingual manipulation;Reduced posterior propulsion;Left pocketing in lateral sulci;Premature spillage;Delayed oral transit Oral - Thin Straw Weak lingual manipulation;Premature spillage;Delayed oral transit;Left pocketing in lateral sulci;Reduced posterior propulsion Oral - Puree Weak lingual  manipulation;Lingual pumping;Delayed oral transit;Reduced posterior propulsion;Premature spillage Oral - Mech Soft Weak lingual manipulation;Left pocketing in lateral sulci;Piecemeal swallowing;Delayed oral transit;Decreased bolus cohesion;Premature spillage;Impaired mastication Oral - Regular -- Oral - Multi-Consistency -- Oral - Pill Delayed oral transit;Premature spillage;Weak lingual manipulation;Piecemeal swallowing;Left pocketing in lateral sulci;Reduced posterior propulsion Oral Phase - Comment --  CHL IP PHARYNGEAL PHASE 06/08/2015 Pharyngeal Phase Thin;Nectar Pharyngeal- Pudding Teaspoon -- Pharyngeal -- Pharyngeal- Pudding Cup -- Pharyngeal -- Pharyngeal- Honey Teaspoon -- Pharyngeal -- Pharyngeal- Honey Cup -- Pharyngeal -- Pharyngeal- Nectar Teaspoon Delayed swallow initiation-vallecula;Penetration/Aspiration during swallow Pharyngeal Material enters airway, remains ABOVE vocal cords and not ejected out Pharyngeal- Nectar Cup Delayed swallow initiation-vallecula;Penetration/Aspiration during swallow Pharyngeal Material enters airway, CONTACTS cords and then ejected out Pharyngeal- Nectar Straw Delayed swallow initiation-pyriform sinuses Pharyngeal -- Pharyngeal- Thin Teaspoon Penetration/Aspiration during swallow;Delayed swallow initiation-vallecula;Trace aspiration Pharyngeal Material enters airway, passes BELOW cords then ejected out Pharyngeal- Thin Cup Penetration/Aspiration during swallow;Penetration/Apiration after swallow;Delayed swallow initiation-vallecula;Pharyngeal residue - valleculae Pharyngeal Material enters airway, CONTACTS cords and not ejected out Pharyngeal- Thin Straw Penetration/Aspiration during swallow;Delayed swallow initiation-vallecula Pharyngeal Material enters airway, CONTACTS cords and not ejected out Pharyngeal- Puree Delayed swallow initiation-vallecula Pharyngeal -- Pharyngeal- Mechanical Soft Delayed swallow initiation-vallecula Pharyngeal -- Pharyngeal- Regular --  Pharyngeal -- Pharyngeal- Multi-consistency -- Pharyngeal -- Pharyngeal- Pill Penetration/Aspiration during swallow;Delayed swallow initiation-vallecula Pharyngeal Material enters airway, passes BELOW cords and not ejected out despite cough attempt by patient Pharyngeal Comment --  No flowsheet data found. Marcene Duoshelsea Sumney MA, CCC-SLP Acute Care Speech Language Pathologist  Kennieth RadSumney, Chelsea E 06/08/2015, 4:46 PM                 Medications:   Impression:  Principal Problem:   Acute ischemic stroke (HCC) Active Problems:   Tobacco abuse   Diabetes mellitus without complication (HCC)   Hypertension   COPD exacerbation (HCC)     Plan: Decrease Solu-Medrol to 80 mg IV every 8 hours  Consultants: Physical therapy and neurology   Procedures   Antibiotics:                   Code Status:   Family Communication:    Disposition Plan see plan above  Time spent: 30 minutes   LOS: 9 days   Najae Rathert M   06/09/2015, 1:05 PM

## 2015-06-09 NOTE — Care Management Note (Signed)
Case Management Note  Patient Details  Name: Tara Park MRN: [id number] Date of Birth: 09-Jul-1946  Subjective/Objective:                    Action/Plan:   Expected Discharge Date:                  Expected Discharge Plan:  Home/Self Care  In-House Referral:  NA  Discharge planning Services  CM Consult  Post Acute Care Choice:  NA Choice offered to:  NA  DME Arranged:    DME Agency:     HH Arranged:    HH Agency:     Status of Service:  Completed, signed off  Medicare Important Message Given:  Yes Date Medicare IM Given:    Medicare IM give by:    Date Additional Medicare IM Given:    Additional Medicare Important Message give by:     If discussed at Long Length of Stay Meetings, dates discussed:06/09/15    Additional Comments:  Cheryl FlashBlackwell, Cait Locust Crowder, RN 06/09/2015, 2:05 PM

## 2015-06-09 NOTE — Progress Notes (Signed)
PT Cancellation Note  Patient Details Name: Tara GuyJane F Park MRN: [id number] DOB: 1945-12-19   Cancelled Treatment:    Reason Eval/Treat Not Completed: Medical issues which prohibited therapy.  Pt found supine in bed on 4 L O2 with labored breathing.  She stated that she needed a breathing tx.  Her O2 sat=91%, HR=103.  She also c/o extreme fatigue from not sleeping at all last night and falls asleep mid sentence.  RN was notified about respiratory status.  RT has been called.  Will have to defer PT for today.   Myrlene BrokerBrown, Kayron Kalmar L  PT 06/09/2015, 11:15 AM [phone]

## 2015-06-09 NOTE — [place] FL2 (Signed)
[name] FL2 LEVEL OF CARE SCREENING TOOL     IDENTIFICATION  Patient Name: Tara Park Birthdate: January 25, 1946 Sex: female Admission Date (Current Location): 05/31/2015  [place] and [place] Number: Aaron Edelman [id number] Facility and Address:  [facility],  [address] [place] [address]      Provider Number: [id number]  Attending Physician Name and Address:  Oval Linsey, MD  Relative Name and Phone Number:       Current Level of Care: Hospital Recommended Level of Care: Skilled Nursing Facility Prior Approval Number:    Date Approved/Denied:   PASRR Number:  [id number]   Discharge Plan: SNF    Current Diagnoses: Patient Active Problem List   Diagnosis Date Noted  . Acute ischemic stroke (HCC) 05/31/2015  . COPD exacerbation (HCC) 05/31/2015  . Tobacco abuse   . Diabetes mellitus without complication (HCC)   . Hypertension   . BURSITIS, HIP 09/09/2007  . TRIGGER FINGER 09/09/2007  . FRACTURE, FEMUR, INTERTROCHANTERIC REGION 09/09/2007  . DIABETES 08/01/2007    Orientation ACTIVITIES/SOCIAL BLADDER RESPIRATION    Self, Time, Situation, Place  Family supportive Indwelling catheter O2 (As needed) (4L)  BEHAVIORAL SYMPTOMS/MOOD NEUROLOGICAL BOWEL NUTRITION STATUS  Other (Comment) (n/a)  (n/a) Incontinent Diet (dysphagia 1 with nectar thick liquids. carb modified. see d/c summary for updates)  PHYSICIAN VISITS COMMUNICATION OF NEEDS Height & Weight Skin    Verbally  (162.6 cm) 139 lbs. Bruising, Skin abrasions (left arm abrasion. right leg cracking)          AMBULATORY STATUS RESPIRATION     (not ambulatory at this time) O2 (As needed) (4L)      Personal Care Assistance Level of Assistance  Bathing, Feeding, Dressing Bathing Assistance: Maximum assistance Feeding assistance: Maximum assistance Dressing Assistance: Maximum assistance      Functional Limitations Info  Sight, Hearing, Speech Sight Info:  Adequate Hearing Info: Adequate Speech Info: Adequate       SPECIAL CARE FACTORS FREQUENCY  PT (By licensed PT)     PT Frequency: 5             Additional Factors Info  Psychotropic, Insulin Sliding Scale Code Status Info: Full code Allergies Info: No known allergies Psychotropic Info: Trazodone Insulin Sliding Scale Info: 3x day       Current Medications (06/09/2015): Current Facility-Administered Medications  Medication Dose Route Frequency Provider Last Rate Last Dose  . albuterol (PROVENTIL) (2.5 MG/3ML) 0.083% nebulizer solution 2.5 mg  2.5 mg Nebulization Q2H PRN Lorretta Harp, MD   2.5 mg at 06/08/15 1834  . aspirin suppository 300 mg  300 mg Rectal Daily Lorretta Harp, MD       Or  . aspirin tablet 325 mg  325 mg Oral Daily Lorretta Harp, MD   325 mg at 06/09/15 0955  . cloNIDine (CATAPRES) tablet 0.2 mg  0.2 mg Oral BID Oval Linsey, MD   0.2 mg at 06/09/15 0807  . clopidogrel (PLAVIX) tablet 75 mg  75 mg Oral Q breakfast Beryle Beams, MD   75 mg at 06/09/15 0807  . dextromethorphan-guaiFENesin (MUCINEX DM) 30-600 MG per 12 hr tablet 1 tablet  1 tablet Oral BID Lorretta Harp, MD   1 tablet at 06/09/15 0955  . digoxin (LANOXIN) tablet 0.125 mg  0.125 mg Oral Daily Oval Linsey, MD   0.125 mg at 06/09/15 0955  . furosemide (LASIX) tablet 20 mg  20 mg Oral Daily Oval Linsey, MD   20 mg at 06/09/15 0955  .  heparin injection 5,000 Units  5,000 Units Subcutaneous 3 times per day Lorretta HarpXilin Niu, MD   5,000 Units at 06/09/15 1349  . HYDROcodone-acetaminophen (NORCO) 7.5-325 MG per tablet 1 tablet  1 tablet Oral Q6H PRN Lorretta HarpXilin Niu, MD   1 tablet at 06/09/15 1504  . insulin aspart (novoLOG) injection 0-9 Units  0-9 Units Subcutaneous TID WC Lorretta HarpXilin Niu, MD   3 Units at 06/09/15 1205  . ipratropium-albuterol (DUONEB) 0.5-2.5 (3) MG/3ML nebulizer solution 3 mL  3 mL Nebulization Q4H WA Oval Linseyichard Dondiego, MD   3 mL at 06/09/15 1519  . isosorbide mononitrate (IMDUR) 24 hr tablet 30 mg  30 mg  Oral Daily Oval Linseyichard Dondiego, MD   30 mg at 06/09/15 0955  . lisinopril (PRINIVIL,ZESTRIL) tablet 10 mg  10 mg Oral Daily Oval Linseyichard Dondiego, MD   10 mg at 06/09/15 0955  . LORazepam (ATIVAN) tablet 1 mg  1 mg Oral QHS PRN Oval Linseyichard Dondiego, MD   1 mg at 06/09/15 1145  . methylPREDNISolone sodium succinate (SOLU-MEDROL) 125 mg/2 mL injection 80 mg  80 mg Intravenous Q6H Richard Janna Archondiego, MD      . potassium chloride 10 mEq in 100 mL IVPB  10 mEq Intravenous Q1 Hr x 3 Oval Linseyichard Dondiego, MD   10 mEq at 06/09/15 1341  . pravastatin (PRAVACHOL) tablet 40 mg  40 mg Oral QPM Lorretta HarpXilin Niu, MD   40 mg at 06/08/15 1732  . RESOURCE THICKENUP CLEAR   Oral PRN Oval Linseyichard Dondiego, MD      . senna-docusate (Senokot-S) tablet 1 tablet  1 tablet Oral QHS PRN Lorretta HarpXilin Niu, MD      . traZODone (DESYREL) tablet 50 mg  50 mg Oral QHS Lorretta HarpXilin Niu, MD   50 mg at 06/08/15 2229  . varenicline (CHANTIX) tablet 1 mg  1 mg Oral BID WC Lorretta HarpXilin Niu, MD   1 mg at 06/02/15 2021   Do not use this list as official medication orders. Please verify with discharge summary.  Discharge Medications:   Medication List    ASK your doctor about these medications        albuterol (2.5 MG/3ML) 0.083% nebulizer solution  Commonly known as:  PROVENTIL  Take 2.5 mg by nebulization every 6 (six) hours as needed for wheezing or shortness of breath.     PROAIR HFA 108 (90 BASE) MCG/ACT inhaler  Generic drug:  albuterol  Inhale 2 puffs into the lungs every 6 (six) hours as needed for wheezing or shortness of breath.     CHANTIX CONTINUING MONTH PAK 1 MG tablet  Generic drug:  varenicline  Take 1 tablet by mouth 2 (two) times daily.     chlorpheniramine 4 MG tablet  Commonly known as:  CHLOR-TRIMETON  Take 4 mg by mouth every 4 (four) hours.     COMBIVENT RESPIMAT 20-100 MCG/ACT Aers respimat  Generic drug:  Ipratropium-Albuterol  Inhale 2 puffs into the lungs 4 (four) times daily.     HYDROcodone-acetaminophen 7.5-325 MG tablet  Commonly known  as:  NORCO  Take 1 tablet by mouth every 6 (six) hours as needed for moderate pain or severe pain.     lisinopril 10 MG tablet  Commonly known as:  PRINIVIL,ZESTRIL  Take 10 mg by mouth every morning.     metFORMIN 500 MG tablet  Commonly known as:  GLUCOPHAGE  Take 500 mg by mouth 2 (two) times daily with a meal.     mometasone 50 MCG/ACT nasal spray  Commonly known as:  NASONEX  Place 1-2 sprays into the nose daily as needed (for allergies).     naproxen 500 MG tablet  Commonly known as:  NAPROSYN  Take 500 mg by mouth 2 (two) times daily.     phenylephrine 10 MG Tabs tablet  Commonly known as:  SUDAFED PE  Take 10 mg by mouth every 4 (four) hours.     pravastatin 40 MG tablet  Commonly known as:  PRAVACHOL  Take 40 mg by mouth every evening.     traZODone 50 MG tablet  Commonly known as:  DESYREL  Take 50 mg by mouth at bedtime.        Relevant Imaging Results:  Relevant Lab Results:  Recent Labs    Additional Information    Karn [place], [place] [phone]

## 2015-06-09 NOTE — Progress Notes (Signed)
Patient ID: Tara Park, female   DOB: 12-23-1945, 69 y.o.   MRN: [id number]  [place] A. Merlene Laughter, MD     [email or web address]          Tara Park is an 69 y.o. female.   Assessment/Plan:  The patient most likely had a TIA a couple days ago and now has progressed to a completed infarct unfortunately. Possibly embolic given bilateral infarctions.   Acute left subcortical infarct likely asymptomatic given the location.   Multiple bilateral lacunar infarcts.  Acute lacunar infarction. Risk factors DM, HTN, tobacco use, and age.  RECOMMENDATION: Suggest 30 day events monitor to check for AFIBB Agree with antiplatelet aspirin therapy. 325 mg daily as recommended. Add plavix short term 4 weeks Smoking cessation. Diabetes control. Blood pressure control. Stop Naproxen and NSAIDS - given the known risks to increase thromboembolic event such as stroke and heart attack.       Doing about the same. Some mild dyspnea   GENERAL: She is acutely dyspneic.  HEENT: Supple. Atraumatic normocephalic.   ABDOMEN: soft  EXTREMITIES: There is marked pitting edema of the right distal leg and ankle with marked limitation of dorsiflexion and the plantarflexion of the ankle.  She had a fracture there and it is fused. Significant arthritic changes of the knees bilaterally.   BACK: Normal.  SKIN: Normal by inspection.   MENTAL STATUS: Alert and oriented. She does have mild dysarthria. There is flattening of the nasolabial fold on the left side.   CRANIAL NERVES: Pupils are equal, round and reactive to light and accommodation; extra ocular movements are full, there is no significant nystagmus; visual fields are full; There is mild left lower facial weakness; tongue is midline; uvula is midline; shoulder elevation is normal.  MOTOR:Right side shows normal tone, bulk and strength. Left upper extremity 0/5 and the left leg 1/5.  COORDINATION: Left finger to nose is  normal, right finger to nose is normal, No rest tremor; no intention tremor; no postural tremor; no bradykinesia.  REFLEXES: Deep tendon reflexes are symmetrical and normal. Babinski reflexes are flexor bilaterally.   SENSATION: Normal to light touch. Normal tactile and visual double simultaneous stimulation.  NIH stroke scale 2.     Objective: Vital signs in last 24 hours: Temp:  [97.5 F (36.4 C)-98.4 F (36.9 C)] 98.4 F (36.9 C) (11/17 1200) Pulse Rate:  [93-106] 96 (11/17 1200) Resp:  [18-20] 20 (11/17 1200) BP: (106-136)/(54-73) 106/54 mmHg (11/17 1200) SpO2:  [82 %-96 %] 93 % (11/17 1944)  Intake/Output from previous day: 11/16 0701 - 11/17 0700 In: 120 [P.O.:120] Out: 300 [Urine:300] Intake/Output this shift:   Nutritional status: DIET - DYS 1 Room service appropriate?: Yes; Fluid consistency:: Nectar Thick   Lab Results: Results for orders placed or performed during the hospital encounter of 05/31/15 (from the past 48 hour(s))  Glucose, capillary     Status: Abnormal   Collection Time: 06/07/15  8:25 PM  Result Value Ref Range   Glucose-Capillary 195 (H) 65 - 99 mg/dL   Comment 1 Notify RN    Comment 2 Document in Chart   Glucose, capillary     Status: Abnormal   Collection Time: 06/08/15  7:13 AM  Result Value Ref Range   Glucose-Capillary 166 (H) 65 - 99 mg/dL   Comment 1 Notify RN   Glucose, capillary     Status: Abnormal   Collection Time: 06/08/15 11:42 AM  Result Value Ref Range   Glucose-Capillary 149 (  H) 65 - 99 mg/dL   Comment 1 Notify RN   Glucose, capillary     Status: Abnormal   Collection Time: 06/08/15  4:25 PM  Result Value Ref Range   Glucose-Capillary 172 (H) 65 - 99 mg/dL   Comment 1 Notify RN   Glucose, capillary     Status: Abnormal   Collection Time: 06/08/15  8:18 PM  Result Value Ref Range   Glucose-Capillary 228 (H) 65 - 99 mg/dL   Comment 1 Notify RN    Comment 2 Document in Chart   Basic metabolic panel     Status:  Abnormal   Collection Time: 06/09/15  6:20 AM  Result Value Ref Range   Sodium 135 135 - 145 mmol/L   Potassium 3.3 (L) 3.5 - 5.1 mmol/L   Chloride 96 (L) 101 - 111 mmol/L   CO2 30 22 - 32 mmol/L   Glucose, Bld 181 (H) 65 - 99 mg/dL   BUN 47 (H) 6 - 20 mg/dL   Creatinine, Ser 0.76 0.44 - 1.00 mg/dL   Calcium 8.1 (L) 8.9 - 10.3 mg/dL   GFR calc non Af Amer >60 >60 mL/min   GFR calc Af Amer >60 >60 mL/min    Comment: (NOTE) The eGFR has been calculated using the CKD EPI equation. This calculation has not been validated in all clinical situations. eGFR's persistently <60 mL/min signify possible Chronic Kidney Disease.    Anion gap 9 5 - 15  CBC with Differential/Platelet     Status: Abnormal   Collection Time: 06/09/15  6:20 AM  Result Value Ref Range   WBC 19.5 (H) 4.0 - 10.5 K/uL   RBC 5.06 3.87 - 5.11 MIL/uL   Hemoglobin 13.5 12.0 - 15.0 g/dL   HCT 39.5 36.0 - 46.0 %   MCV 78.1 78.0 - 100.0 fL   MCH 26.7 26.0 - 34.0 pg   MCHC 34.2 30.0 - 36.0 g/dL   RDW 13.7 11.5 - 15.5 %   Platelets 239 150 - 400 K/uL   Neutrophils Relative % 90 %   Lymphocytes Relative 4 %   Monocytes Relative 6 %   Eosinophils Relative 0 %   Basophils Relative 0 %   Neutro Abs 17.5 (H) 1.7 - 7.7 K/uL   Lymphs Abs 0.8 0.7 - 4.0 K/uL   Monocytes Absolute 1.2 (H) 0.1 - 1.0 K/uL   Eosinophils Absolute 0.0 0.0 - 0.7 K/uL   Basophils Absolute 0.0 0.0 - 0.1 K/uL  Glucose, capillary     Status: Abnormal   Collection Time: 06/09/15  7:21 AM  Result Value Ref Range   Glucose-Capillary 177 (H) 65 - 99 mg/dL   Comment 1 Notify RN   Glucose, capillary     Status: Abnormal   Collection Time: 06/09/15 11:10 AM  Result Value Ref Range   Glucose-Capillary 207 (H) 65 - 99 mg/dL   Comment 1 Notify RN   Glucose, capillary     Status: Abnormal   Collection Time: 06/09/15  4:36 PM  Result Value Ref Range   Glucose-Capillary 151 (H) 65 - 99 mg/dL   Comment 1 Notify RN     Lipid Panel No results for input(s):  CHOL, TRIG, HDL, CHOLHDL, VLDL, LDLCALC in the last 72 hours.  Studies/Results: The patient had CT scan is reviewed in person. 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 Nothing acute is seen. There is also moderate global atrophy. No hemorrhages.   Medications:  Scheduled Meds: .   aspirin  300 mg Rectal Daily   Or  . aspirin  325 mg Oral Daily  . cloNIDine  0.2 mg Oral BID  . clopidogrel  75 mg Oral Q breakfast  . dextromethorphan-guaiFENesin  1 tablet Oral BID  . digoxin  0.125 mg Oral Daily  . furosemide  20 mg Oral Daily  . heparin  5,000 Units Subcutaneous 3 times per day  . insulin aspart  0-9 Units Subcutaneous TID WC  . ipratropium-albuterol  3 mL Nebulization Q4H WA  . isosorbide mononitrate  30 mg Oral Daily  . lisinopril  10 mg Oral Daily  . methylPREDNISolone (SOLU-MEDROL) injection  80 mg Intravenous Q6H  . pravastatin  40 mg Oral QPM  . traZODone  50 mg Oral QHS  . varenicline  1 mg Oral BID WC   Continuous Infusions:   PRN Meds:.albuterol, HYDROcodone-acetaminophen, LORazepam, RESOURCE THICKENUP CLEAR, senna-docusate    Critical care time 77mn  LOS: 9 days   Shoshanah Dapper A. DMerlene Laughter M.D.  Diplomate, ATax adviserof Psychiatry and Neurology ( Neurology).

## 2015-06-10 LAB — GLUCOSE, CAPILLARY
GLUCOSE-CAPILLARY: 168 mg/dL — AB (ref 65–99)
Glucose-Capillary: 163 mg/dL — ABNORMAL HIGH (ref 65–99)

## 2015-06-10 LAB — BASIC METABOLIC PANEL
Anion gap: 8 (ref 5–15)
BUN: 53 mg/dL — AB (ref 6–20)
CO2: 32 mmol/L (ref 22–32)
Calcium: 8.2 mg/dL — ABNORMAL LOW (ref 8.9–10.3)
Chloride: 96 mmol/L — ABNORMAL LOW (ref 101–111)
Creatinine, Ser: 0.84 mg/dL (ref 0.44–1.00)
GFR calc Af Amer: >60 mL/min (ref >60)
Glucose, Bld: 178 mg/dL — ABNORMAL HIGH (ref 65–99)
POTASSIUM: 3.9 mmol/L (ref 3.5–5.1)
SODIUM: 136 mmol/L (ref 135–145)

## 2015-06-10 MED ORDER — ISOSORBIDE MONONITRATE ER 30 MG PO TB24: 30.0000 mg | ORAL_TABLET | Freq: Every day | ORAL | Status: DC

## 2015-06-10 MED ORDER — FUROSEMIDE 20 MG PO TABS: 20.0000 mg | ORAL_TABLET | Freq: Every day | ORAL | Status: DC

## 2015-06-10 MED ORDER — CLONIDINE HCL 0.2 MG PO TABS: 0.2000 mg | ORAL_TABLET | Freq: Two times a day (BID) | ORAL | Status: DC

## 2015-06-10 MED ORDER — PREDNISONE 50 MG PO TABS: 50.0000 mg | ORAL_TABLET | Freq: Every day | ORAL | Status: DC

## 2015-06-10 MED ORDER — PREDNISONE 20 MG PO TABS: 50.0000 mg | ORAL_TABLET | Freq: Every day | ORAL | Status: DC

## 2015-06-10 MED ORDER — DIGOXIN 125 MCG PO TABS: 0.1250 mg | ORAL_TABLET | Freq: Every day | ORAL | Status: DC

## 2015-06-10 MED ORDER — HYDROCODONE-ACETAMINOPHEN 5-325 MG PO TABS
1.0000 | ORAL_TABLET | Freq: Four times a day (QID) | ORAL | Status: DC | PRN
Start: 2015-06-10 — End: 2015-06-14

## 2015-06-10 MED ORDER — ASPIRIN 300 MG RE SUPP: 300.0000 mg | Freq: Every day | RECTAL | Status: DC

## 2015-06-10 MED ORDER — CLOPIDOGREL BISULFATE 75 MG PO TABS: 75.0000 mg | ORAL_TABLET | Freq: Every day | ORAL | Status: AC

## 2015-06-10 NOTE — Progress Notes (Signed)
Pt's IV catheter removed and intact.  Pt's IV site clean dry and intact. Pt to be transferred to [facility] for [facility]. [facility]. Pt in stable condition and in no acute distress at time of discharge. Pt escorted by nurse tech.

## 2015-06-10 NOTE — Discharge Summary (Signed)
Physician Discharge Summary  NATAYLA CADENHEAD ZOX:[id number] DOB: 1946-06-28 DOA: 05/31/2015  PCP: Isabella Stalling, MD  Admit date: 05/31/2015 Discharge date: 06/10/2015   Recommendations for Outpatient Follow-up:  Patient is discharged to skilled nursing facility for an inpatient rehabilitation stay for aggressive physical therapy for addressing dense left repletion ambulation and strengthening as well as monitoring of cardiovascular status and COPD which is now under control Discharge Diagnoses:  Principal Problem:   Acute ischemic stroke [facility]) Active Problems:   Tobacco abuse   Diabetes mellitus without complication (HCC)   Hypertension   COPD exacerbation (HCC)   Discharge Condition: Guarded and improving  Filed Weights   05/31/15 1918 05/31/15 2348  Weight: 140 lb (63.504 kg) 139 lb 1.8 oz (63.1 kg)    History of present illness:  Patient is a 69 year old white female with severe history of COPD was currently smoking 2 packs per day history of diabetes hypertension hyperlipidemia who presented with a right cerebral infarction with dense left hemiplegia subsequently manifesting patient was seen in consultation by neurology as well as physical therapy while in hospital she had COPD exacerbation 2-D echo performed revealed an ischemic cardiopathy with multiple regional segmental wall motion abnormalities uterus fraction 3035% in addition to her COPD possible congestive heart failure was also addressed she had nitrates added diuretics added and digoxin added as well as aggressive nebulizer therapy and intravenous Solu-Medrol her lung situation improved her breathing was normal on the day of discharge she was subtotally discharged with an additional 7 days of prednisone 50 mg by mouth daily for 7 days and then reevaluate she is discharged for aggressive physical therapy to skilled nursing facility and aggressive rehabilitation stay  Hospital Course:  See history of present illness  above  Procedures:    Consultations:  Neurology and physical therapy  Discharge Instructions  Discharge Instructions    Discharge instructions    Complete by:  As directed      Discharge patient    Complete by:  As directed             Medication List    STOP taking these medications        CHANTIX CONTINUING MONTH PAK 1 MG tablet  Generic drug:  varenicline     chlorpheniramine 4 MG tablet  Commonly known as:  CHLOR-TRIMETON     HYDROcodone-acetaminophen 7.5-325 MG tablet  Commonly known as:  NORCO  Replaced by:  HYDROcodone-acetaminophen 5-325 MG tablet     mometasone 50 MCG/ACT nasal spray  Commonly known as:  NASONEX     naproxen 500 MG tablet  Commonly known as:  NAPROSYN     phenylephrine 10 MG Tabs tablet  Commonly known as:  SUDAFED PE      TAKE these medications        albuterol (2.5 MG/3ML) 0.083% nebulizer solution  Commonly known as:  PROVENTIL  Take 2.5 mg by nebulization every 6 (six) hours as needed for wheezing or shortness of breath.     aspirin 300 MG suppository  Place 1 suppository (300 mg total) rectally daily.     cloNIDine 0.2 MG tablet  Commonly known as:  CATAPRES  Take 1 tablet (0.2 mg total) by mouth 2 (two) times daily.     clopidogrel 75 MG tablet  Commonly known as:  PLAVIX  Take 1 tablet (75 mg total) by mouth daily with breakfast.     COMBIVENT RESPIMAT 20-100 MCG/ACT Aers respimat  Generic drug:  Ipratropium-Albuterol  Inhale  2 puffs into the lungs 4 (four) times daily.     digoxin 0.125 MG tablet  Commonly known as:  LANOXIN  Take 1 tablet (0.125 mg total) by mouth daily.     furosemide 20 MG tablet  Commonly known as:  LASIX  Take 1 tablet (20 mg total) by mouth daily.     HYDROcodone-acetaminophen 5-325 MG tablet  Commonly known as:  NORCO  Take 1 tablet by mouth every 6 (six) hours as needed for moderate pain.     isosorbide mononitrate 30 MG 24 hr tablet  Commonly known as:  IMDUR  Take 1 tablet (30  mg total) by mouth daily.     lisinopril 10 MG tablet  Commonly known as:  PRINIVIL,ZESTRIL  Take 10 mg by mouth every morning.     metFORMIN 500 MG tablet  Commonly known as:  GLUCOPHAGE  Take 500 mg by mouth 2 (two) times daily with a meal.     pravastatin 40 MG tablet  Commonly known as:  PRAVACHOL  Take 40 mg by mouth every evening.     predniSONE 50 MG tablet  Commonly known as:  DELTASONE  Take 1 tablet (50 mg total) by mouth daily with breakfast.  Start taking on:  06/11/2015     traZODone 50 MG tablet  Commonly known as:  DESYREL  Take 50 mg by mouth at bedtime.       No Known Allergies    The results of significant diagnostics from this hospitalization (including imaging, microbiology, ancillary and laboratory) are listed below for reference.    Significant Diagnostic Studies: Ct Angio Head W/cm &/or Wo Cm  06/02/2015  CLINICAL DATA:  69 year old female with left side weakness found to have left MCA white matter lacunar infarct on MRI. Initial encounter. EXAM: CT ANGIOGRAPHY HEAD AND NECK TECHNIQUE: Multidetector CT imaging of the head and neck was performed using the standard protocol during bolus administration of intravenous contrast. Multiplanar CT image reconstructions and MIPs were obtained to evaluate the vascular anatomy. Carotid stenosis measurements (when applicable) are obtained utilizing NASCET criteria, using the distal internal carotid diameter as the denominator. CONTRAST:  75mL OMNIPAQUE IOHEXOL 350 MG/ML SOLN COMPARISON:  Head CT without contrast 1357 hours today. Brain MRI 05/31/2015. Intracranial MRA 06/01/2015. FINDINGS: CTA NECK Skeleton: No acute osseous abnormality identified. Degenerative changes and osteopenia in the spine. Absent dentition. Paranasal sinuses and mastoids are clear. Other neck: Moderate to severe pulmonary emphysema. No superior mediastinal lymphadenopathy. Thyroid, larynx, pharynx, parapharyngeal spaces, retropharyngeal space,  sublingual space, submandibular glands, and parotid glands are within normal limits. Postoperative changes to the globes. Negative scalp soft tissues. No cervical lymphadenopathy. Aortic arch: 4 vessel arch configuration, the left vertebral artery arises directly from the arch (series 9, image 21). Moderate mostly calcified arch atherosclerosis. No great vessel origin stenosis. Right carotid system: Partially retropharyngeal course of the right CCA. No right CCA stenosis. At the right carotid bifurcation there is soft and calcified plaque resulting in 60 % stenosis with respect to the distal vessel at the right ICA origin and lesser stenosis in the bulb. Distal to the bulb the cervical right ICA is negative. Left carotid system: Negative left CCA. At the left carotid bifurcation there is mostly calcified atherosclerosis with no definite hemodynamically significant proximal left ICA stenosis. There is also mild calcified plaque distal to the bulb just below the skullbase, but no cervical left ICA stenosis. Vertebral arteries:No proximal right subclavian artery stenosis despite calcified plaque. Dense calcified plaque  at the right vertebral artery origin with moderate to severe associated stenosis. Tortuous right V1 segment. The right vertebral artery is dominant. There is occasional V2 segment calcified plaque. No other vertebral artery stenosis in the neck. The will left vertebral artery is non dominant, diminutive, and arises directly from the arch. No left vertebral artery stenosis identified. CTA HEAD Posterior circulation: Dominant distal right vertebral artery with V4 segment calcified plaque resulting in moderate to severe stenosis best seen on series 605, image 105. The non dominant left vertebral artery is diminutive and functionally terminates in PICA. The basilar artery is diminutive and mildly tortuous. There is mild proximal third basilar artery stenosis. The SCA origins are normal. Fetal type bilateral  PCA origins. Right PCA branches are within normal limits. There is left P2 segment moderate stenosis with preserved distal enhancement. Anterior circulation: Densely calcified bilateral cavernous and supra clinoid ICA siphon segments. Moderate bilateral supraclinoid segment stenosis seen on the recent MRA comparison as anterior genu and proximal supr clinoid signal loss bilaterally which was probably suspected due to artifact on that study. Ophthalmic artery and posterior communicating artery origins are normal. Both carotid termini remain patent. MCA origins are normal. Moderate left ACA origin stenosis as seen on the MRA. Normal right ACA origin. Diminutive or absent anterior communicating artery. Bilateral ACA branches are within normal limits. Right MCA M1 segment, bifurcation, and right MCA branches are within normal limits. Left MCA M1 segment, bifurcation, and left MCA branches are within normal limits. Venous sinuses: Patent. Anatomic variants: Non dominant left vertebral artery that arises directly from the arch. Fetal type bilateral PCA origins. Delayed phase: No midline shift, mass effect, or evidence of intracranial mass lesion. Stable gray-white matter differentiation with Patchy and confluent bilateral white matter hypodensity. Chronic left basal ganglia lacunar infarcts. No acute intracranial hemorrhage identified. No abnormal enhancement identified. IMPRESSION: 1. Bilateral cervical carotid atherosclerosis greater on the right. 60% stenosis of the right ICA origin. 2. Extensive calcified plaque in both ICA siphons with moderate bilateral supraclinoid segment stenosis. 3. Calcified plaque causes moderate to severe stenosis at the origin of the dominant right vertebral artery. Non dominant left vertebral artery arises directly from the arch and functionally terminates in PICA. 4. Stable circle of Willis branches from the recent MRA including moderate left PCA stenosis. 5. Stable CT appearance of the  brain from earlier today. 6. Pulmonary emphysema. Electronically Signed   By: Odessa Fleming M.D.   On: 06/02/2015 18:24   Dg Chest 2 View  05/31/2015  CLINICAL DATA:  Sudden onset of left-sided weakness, shortness of breath and cough tonight. EXAM: CHEST  2 VIEW COMPARISON:  05/12/2013. FINDINGS: The cardiac silhouette, mediastinal and hilar contours are within normal limits and stable. There is tortuosity of the thoracic aorta. There are chronic emphysematous and bronchitic lung changes but no acute overlying pulmonary process. No definite pleural effusions. The bony thorax appears stable. Numerous remote thoracic and lumbar compression deformities and osteoporosis. IMPRESSION: Chronic emphysematous and bronchitic lung changes but no acute overlying pulmonary process. Electronically Signed   By: Rudie Meyer M.D.   On: 05/31/2015 23:04   Ct Head Wo Contrast  06/04/2015  CLINICAL DATA:  Left arm weakness. EXAM: CT HEAD WITHOUT CONTRAST TECHNIQUE: Contiguous axial images were obtained from the base of the skull through the vertex without intravenous contrast. COMPARISON:  Yesterday FINDINGS: No new or acute osseous or extracranial findings. New ovoid low-density in the right putamen and corona radiata consistent with interval infarct. Left  centrum semiovale infarct noted on brain MRI 05/31/2015 is largely obscured by extensive chronic small vessel ischemic gliosis. Remote lacune in the left putamen with flattened cavity. No hemorrhagic conversion. No hydrocephalus or shift. Age related cerebral volume loss. IMPRESSION: 1. Acute, nonhemorrhagic infarct in the right putamen and corona radiata that is new from head CT yesterday. 2. Recently demonstrated left corona radiata infarct is obscured by extensive chronic ischemic gliosis. Electronically Signed   By: Marnee Spring M.D.   On: 06/04/2015 05:08   Ct Head Wo Contrast  06/02/2015  ADDENDUM REPORT: 06/02/2015 14:44 ADDENDUM: These results were called by  telephone at the time of interpretation on 06/02/2015 at 2:44 pm to Dr. Gerilyn Pilgrim, who verbally acknowledged these results. Electronically Signed   By: Delbert Phenix M.D.   On: 06/02/2015 14:44  06/02/2015  CLINICAL DATA:  CVA.  Cough.  Altered mental status. EXAM: CT HEAD WITHOUT CONTRAST TECHNIQUE: Contiguous axial images were obtained from the base of the skull through the vertex without intravenous contrast. COMPARISON:  05/31/2015 MRI brain. FINDINGS: No evidence of parenchymal hemorrhage or extra-axial fluid collection. No mass lesion, mass effect, or midline shift. There is patchy hypodensity in the left deep white matter at the site of the recently described left centrum semiovale ischemic infarct on the brain MRI from 2 days prior. There is additional prominent patchy periventricular and deep white matter hypodensity bilaterally. There is intracranial atherosclerosis. Old lacunar infarcts are noted in the right thalamus and left basal ganglia. Cerebral volume is age appropriate. No ventriculomegaly. The visualized paranasal sinuses are essentially clear. The mastoid air cells are unopacified. No evidence of calvarial fracture. IMPRESSION: 1. Patchy hypodensity in the left deep white matter at the site of the recently described left centrum semiovale ischemic infarct on the brain MRI from 2 days prior, in keeping with a subacute left frontal white matter infarct. No acute intracranial hemorrhage, mass effect or midline shift. 2. Intracranial atherosclerosis, old right thalamic and left basal ganglia lacunar infarcts and prominent chronic small vessel ischemic white matter change. These results were called by telephone at the time of interpretation on 06/02/2015 at 2:13 pm to Dr. Oval Linsey , who verbally acknowledged these results. Electronically Signed: By: Delbert Phenix M.D. On: 06/02/2015 14:19   Ct Angio Neck W/cm &/or Wo/cm  06/02/2015  CLINICAL DATA:  69 year old female with left side weakness  found to have left MCA white matter lacunar infarct on MRI. Initial encounter. EXAM: CT ANGIOGRAPHY HEAD AND NECK TECHNIQUE: Multidetector CT imaging of the head and neck was performed using the standard protocol during bolus administration of intravenous contrast. Multiplanar CT image reconstructions and MIPs were obtained to evaluate the vascular anatomy. Carotid stenosis measurements (when applicable) are obtained utilizing NASCET criteria, using the distal internal carotid diameter as the denominator. CONTRAST:  75mL OMNIPAQUE IOHEXOL 350 MG/ML SOLN COMPARISON:  Head CT without contrast 1357 hours today. Brain MRI 05/31/2015. Intracranial MRA 06/01/2015. FINDINGS: CTA NECK Skeleton: No acute osseous abnormality identified. Degenerative changes and osteopenia in the spine. Absent dentition. Paranasal sinuses and mastoids are clear. Other neck: Moderate to severe pulmonary emphysema. No superior mediastinal lymphadenopathy. Thyroid, larynx, pharynx, parapharyngeal spaces, retropharyngeal space, sublingual space, submandibular glands, and parotid glands are within normal limits. Postoperative changes to the globes. Negative scalp soft tissues. No cervical lymphadenopathy. Aortic arch: 4 vessel arch configuration, the left vertebral artery arises directly from the arch (series 9, image 21). Moderate mostly calcified arch atherosclerosis. No great vessel origin stenosis. Right carotid  system: Partially retropharyngeal course of the right CCA. No right CCA stenosis. At the right carotid bifurcation there is soft and calcified plaque resulting in 60 % stenosis with respect to the distal vessel at the right ICA origin and lesser stenosis in the bulb. Distal to the bulb the cervical right ICA is negative. Left carotid system: Negative left CCA. At the left carotid bifurcation there is mostly calcified atherosclerosis with no definite hemodynamically significant proximal left ICA stenosis. There is also mild calcified  plaque distal to the bulb just below the skullbase, but no cervical left ICA stenosis. Vertebral arteries:No proximal right subclavian artery stenosis despite calcified plaque. Dense calcified plaque at the right vertebral artery origin with moderate to severe associated stenosis. Tortuous right V1 segment. The right vertebral artery is dominant. There is occasional V2 segment calcified plaque. No other vertebral artery stenosis in the neck. The will left vertebral artery is non dominant, diminutive, and arises directly from the arch. No left vertebral artery stenosis identified. CTA HEAD Posterior circulation: Dominant distal right vertebral artery with V4 segment calcified plaque resulting in moderate to severe stenosis best seen on series 605, image 105. The non dominant left vertebral artery is diminutive and functionally terminates in PICA. The basilar artery is diminutive and mildly tortuous. There is mild proximal third basilar artery stenosis. The SCA origins are normal. Fetal type bilateral PCA origins. Right PCA branches are within normal limits. There is left P2 segment moderate stenosis with preserved distal enhancement. Anterior circulation: Densely calcified bilateral cavernous and supra clinoid ICA siphon segments. Moderate bilateral supraclinoid segment stenosis seen on the recent MRA comparison as anterior genu and proximal supr clinoid signal loss bilaterally which was probably suspected due to artifact on that study. Ophthalmic artery and posterior communicating artery origins are normal. Both carotid termini remain patent. MCA origins are normal. Moderate left ACA origin stenosis as seen on the MRA. Normal right ACA origin. Diminutive or absent anterior communicating artery. Bilateral ACA branches are within normal limits. Right MCA M1 segment, bifurcation, and right MCA branches are within normal limits. Left MCA M1 segment, bifurcation, and left MCA branches are within normal limits. Venous  sinuses: Patent. Anatomic variants: Non dominant left vertebral artery that arises directly from the arch. Fetal type bilateral PCA origins. Delayed phase: No midline shift, mass effect, or evidence of intracranial mass lesion. Stable gray-white matter differentiation with Patchy and confluent bilateral white matter hypodensity. Chronic left basal ganglia lacunar infarcts. No acute intracranial hemorrhage identified. No abnormal enhancement identified. IMPRESSION: 1. Bilateral cervical carotid atherosclerosis greater on the right. 60% stenosis of the right ICA origin. 2. Extensive calcified plaque in both ICA siphons with moderate bilateral supraclinoid segment stenosis. 3. Calcified plaque causes moderate to severe stenosis at the origin of the dominant right vertebral artery. Non dominant left vertebral artery arises directly from the arch and functionally terminates in PICA. 4. Stable circle of Willis branches from the recent MRA including moderate left PCA stenosis. 5. Stable CT appearance of the brain from earlier today. 6. Pulmonary emphysema. Electronically Signed   By: Odessa Fleming M.D.   On: 06/02/2015 18:24   Mr Maxine Glenn Head Wo Contrast  06/01/2015  CLINICAL DATA:  Left-sided weakness. Acute infarct in the left centrum semiovale on MRI yesterday. EXAM: MRA HEAD WITHOUT CONTRAST TECHNIQUE: Angiographic images of the Circle of Willis were obtained using MRA technique without intravenous contrast. COMPARISON:  None. FINDINGS: The visualized distal right vertebral artery is patent and dominant without stenosis. The  distal left vertebral artery appears hypoplastic. PICA origins are patent. Right AICA and bilateral SCA origins are patent. Basilar artery is patent and slightly small in caliber developmentally without superimposed stenosis. There are prominent posterior communicating arteries bilaterally. The right P1 segment is hypoplastic, and the left P1 segment may be absent. The proximal P2 segments are widely  patent. The more distal PCAs are not well evaluated due to diminished flow related signal, and underlying mid to distal P2 stenoses are not excluded. The internal carotid arteries are patent from skullbase to carotid termini. There is bilateral carotid siphon atherosclerotic irregularity with minimal proximal right cavernous stenosis and mild proximal left supraclinoid ICA stenosis. The M1 segments are patent without stenosis. MCA bifurcations are patent without evidence of major branch vessel occlusion, however there is mild-to-moderate MCA branch vessel irregularity and attenuation bilaterally. Right A1 segment is widely patent. There is a mild proximal left A1 stenosis. No intracranial aneurysm is identified. IMPRESSION: 1. No evidence of medium or large vessel intracranial occlusion or flow-limiting proximal stenosis. 2. Mild left supraclinoid ICA and proximal left ACA stenoses. 3. Anterior and posterior circulation branch vessel irregularity and attenuation suggestive of atherosclerosis. Electronically Signed   By: Sebastian Ache M.D.   On: 06/01/2015 09:02   Mr Brain Wo Contrast (neuro Protocol)  05/31/2015  CLINICAL DATA:  Initial evaluation for acute onset left-sided weakness. EXAM: MRI HEAD WITHOUT CONTRAST TECHNIQUE: Multiplanar, multiecho pulse sequences of the brain and surrounding structures were obtained without intravenous contrast. COMPARISON:  None. FINDINGS: There is a in 5 mm focus of restricted diffusion involving the deep white matter of the left centrum semi ovale and the mid to posterior left frontal lobe (series 100, image 38), consistent with a small acute ischemic infarct. No associated edema or hemorrhage. No other acute ischemic infarct. Normal intracranial vascular flow voids are maintained. No acute intracranial hemorrhage. Mild age-related cerebral atrophy is present. Patchy and confluent T2/FLAIR hyperintensity within the periventricular and deep white matter both cerebral  hemispheres most likely related to chronic small vessel ischemic disease, moderate nature. Similar changes seen within the pons. Remote hemorrhagic lacunar type infarct within the left lentiform nucleus. Additional remote lacunar infarct within the right thalamus. No mass lesion, midline shift, or mass effect. No hydrocephalus. No extra-axial fluid collection. Craniocervical junction within normal limits. Pituitary gland normal. No acute abnormality about the orbits. Sequela prior bilateral lens extraction noted. Minimal layering fluid within the maxillary sinuses bilaterally. Paranasal sinuses and mastoid air cells are otherwise clear. Inner ear structures grossly normal. Bone marrow signal intensity within normal limits. No scalp soft tissue abnormality. IMPRESSION: 1. 5 mm acute ischemic nonhemorrhagic infarct within the left centrum semi ovale. No significant mass effect. 2. Remote lacunar infarcts involving the left lentiform nucleus and right thalamus. 3. Age-related cerebral atrophy with moderate chronic microvascular ischemic disease. Electronically Signed   By: Rise Mu M.D.   On: 05/31/2015 20:20   US Carotid Bilateral  06/01/2015  CLINICAL DATA:  Left hemispheric cerebral infarction, hypertension, syncope, hyperlipidemia, diabetes and tobacco use. EXAM: BILATERAL CAROTID DUPLEX ULTRASOUND TECHNIQUE: Wallace Cullens scale imaging, color Doppler and duplex ultrasound were performed of bilateral carotid and vertebral arteries in the neck. COMPARISON:  None. FINDINGS: Criteria: Quantification of carotid stenosis is based on velocity parameters that correlate the residual internal carotid diameter with NASCET-based stenosis levels, using the diameter of the distal internal carotid lumen as the denominator for stenosis measurement. The following velocity measurements were obtained: RIGHT ICA:  102/24 cm/sec CCA:  81/15 cm/sec SYSTOLIC ICA/CCA RATIO:  1.3 DIASTOLIC ICA/CCA RATIO:  1.6 ECA:  223 cm/sec LEFT  ICA:  75/19 cm/sec CCA:  54/13 cm/sec SYSTOLIC ICA/CCA RATIO:  1.4 DIASTOLIC ICA/CCA RATIO:  1.4 ECA:  187 cm/sec RIGHT CAROTID ARTERY: There is a moderate amount of predominately calcified plaque at the level of the carotid bulb and proximal internal carotid artery. Based on velocities, estimated right ICA stenosis is less than 50%. Based on grayscale images, stenosis is likely closer to the 50% range. RIGHT VERTEBRAL ARTERY: Antegrade flow with normal waveform and velocity. LEFT CAROTID ARTERY: Moderate amount of partially calcified plaque is present at the level of the left carotid bulb and proximal internal carotid artery. Velocities correspond to an estimated less than 50% left ICA stenosis. LEFT VERTEBRAL ARTERY: Antegrade flow with normal waveform and velocity. IMPRESSION: Moderate calcified plaque at the level of both carotid bulbs and proximal internal carotid arteries. Plaque burden slightly more prominent on the right compared to the left by ultrasound. Bilateral ICA stenoses are estimated at less than 50% based on velocity criteria. Based on grayscale imaging, the right ICA stenosis is likely closer to the 50% range. Electronically Signed   By: Irish Lack M.D.   On: 06/01/2015 09:11   Dg Chest Port 1 View  06/06/2015  CLINICAL DATA:  Short of breath.  Wheezing.  History of COPD. EXAM: PORTABLE CHEST 1 VIEW COMPARISON:  06/05/2015 FINDINGS: Cardiac silhouette is normal in size and configuration. No mediastinal or hilar masses or evidence of adenopathy. Prominent interstitial markings most evident at the lung bases is similar to the prior exam. There are no areas of lung consolidation. No pleural effusion or pneumothorax. Bony thorax is demineralized but intact. IMPRESSION: 1. Prominent interstitial markings mostly in the lung bases. This is most likely due to chronic bronchitic change. There is no evidence of pneumonia or pulmonary edema. No change from the recent prior study. Electronically  Signed   By: Amie Portland M.D.   On: 06/06/2015 13:18   Dg Chest Port 1 View  06/05/2015  CLINICAL DATA:  Shortness of breath EXAM: PORTABLE CHEST 1 VIEW COMPARISON:  05/31/2015 FINDINGS: Hyperinflated lungs with emphysematous and bronchitic changes. Markings are diffusely even more prominent than prior. No focal consolidation, effusion, or pneumothorax. Normal heart size and stable mediastinal contours when allowing for rightward rotation. No acute osseous findings. IMPRESSION: 1. Interstitial coarsening above baseline which could be acute bronchitic or congestive. 2. COPD. Electronically Signed   By: Marnee Spring M.D.   On: 06/05/2015 05:43   Dg Swallowing Func-speech Pathology  06/08/2015  Objective Swallowing Evaluation:   Patient Details Name: KEAH LAMBA MRN: [id number] Date of Birth: 05-06-1946 Today's Date: 06/08/2015 Time: SLP Start Time (ACUTE ONLY): 1546-SLP Stop Time (ACUTE ONLY): 1606 SLP Time Calculation (min) (ACUTE ONLY): 20 min Past Medical History: Past Medical History Diagnosis Date . Diabetes mellitus without complication (HCC)  . Hypertension  . Tobacco abuse  . Use of cane as ambulatory aid  . Leg edema, right    chronic Past Surgical History: Past Surgical History Procedure Laterality Date . Cataract extraction   . Hip surgery   . Foot surgery   HPI: Pt is a 69 y.o. female with PMH of COPD, hypertension, hyperlipidemia, diabetes mellitus, tobacco abuse, COPD, who presented with left-sided weakness. 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 Pt was initially evaluated by SLP on 06-01-15 subsequentely without associated  swallow deficits. Since then pt with recurrent onset of stroke symptoms and exhibited notable left sided weakness, dysarthria, and dysphagia. CT of head on 06-04-15 revealed acute, nonhemorrhagic infarct in the right putamen and corona radiata.  No Data Recorded Assessment / Plan / Recommendation  CHL IP CLINICAL IMPRESSIONS 06/08/2015 Therapy Diagnosis Moderate oral phase dysphagia;Mild pharyngeal phase dysphagia Clinical Impression Pt presents with sensory motor moderate oral and mild pharyngeal dysphagia of neurogenic etiology. Weak lingual manipulation, left sided sulci pocketting, and reduced timely AP transfer resulted in premature spillage and delay in swallow initiation across all consistencies. Pt with consistent deep penetration of thin liquids via teaspoon, cup, and straw to the level of the true vocal cords with one suspected episode of aspiration which was sensed with overt reflexive cough by the patient. (Initial presentation of thin via teaspoon with limited visibility). Aspiration noted with barium tablet and thin liquids via cup, which was sensed with overt coughing but not effectively cleared. Penetration of thin liquids also evidenced after the swallow secondary to oral residuals spilling to valleculae, and into larygneal vestibule. Nectar thick liquid trials yielded flash penetration and no observable aspiration. Pt requests puree diet given oral weakness and fatigue with mechanical soft and chopped consistencies. Recommend conservative diet implementation of nectar thick liquids and dysphagia 1 (puree) consistencies given history of decreased respiratory support and previous clinically observed increased signs and symptoms of reduced airway protection with prolonged PO trials and meals. Treating SLP at next level of care to upgrade as clinically appropriate.       CHL IP TREATMENT RECOMMENDATION 06/08/2015 Treatment Recommendations Defer treatment plan to f/u with SLP   CHL IP DIET RECOMMENDATION 06/08/2015 SLP Diet Recommendations Dysphagia 1 (Puree) solids;Nectar thick liquid Liquid Administration via Cup;Straw Medication Administration Whole meds with puree Compensations Slow rate;Small sips/bites;Multiple dry swallows after each bite/sip;Minimize environmental distractions;Lingual  sweep for clearance of pocketing;Follow solids with liquid Postural Changes Seated upright at 90 degrees;Remain semi-upright after after feeds/meals (Comment)   CHL IP OTHER RECOMMENDATIONS 06/08/2015 Recommended Consults -- Oral Care Recommendations Oral care BID Other Recommendations Order thickener from pharmacy   CHL IP FOLLOW UP RECOMMENDATIONS 06/08/2015 Follow up Recommendations Skilled Nursing facility   [facility] An IP FREQUENCY AND DURATION 06/01/2015 Speech Therapy Frequency (ACUTE ONLY) min 2x/week Treatment Duration 1 week      CHL IP ORAL PHASE 06/08/2015 Oral Phase Impaired Oral - Pudding Teaspoon -- Oral - Pudding Cup -- Oral - Honey Teaspoon -- Oral - Honey Cup -- Oral - Nectar Teaspoon Weak lingual manipulation;Reduced posterior propulsion;Left pocketing in lateral sulci;Delayed oral transit;Premature spillage Oral - Nectar Cup Weak lingual manipulation;Left pocketing in lateral sulci;Reduced posterior propulsion;Delayed oral transit;Premature spillage Oral - Nectar Straw Weak lingual manipulation;Reduced posterior propulsion;Left pocketing in lateral sulci;Delayed oral transit;Premature spillage Oral - Thin Teaspoon Weak lingual manipulation;Delayed oral transit;Reduced posterior propulsion;Left pocketing in lateral sulci;Lingual/palatal residue;Premature spillage Oral - Thin Cup Weak lingual manipulation;Reduced posterior propulsion;Left pocketing in lateral sulci;Premature spillage;Delayed oral transit Oral - Thin Straw Weak lingual manipulation;Premature spillage;Delayed oral transit;Left pocketing in lateral sulci;Reduced posterior propulsion Oral - Puree Weak lingual manipulation;Lingual pumping;Delayed oral transit;Reduced posterior propulsion;Premature spillage Oral - Mech Soft Weak lingual manipulation;Left pocketing in lateral sulci;Piecemeal swallowing;Delayed oral transit;Decreased bolus cohesion;Premature spillage;Impaired mastication Oral - Regular -- Oral - Multi-Consistency -- Oral - Pill  Delayed oral transit;Premature spillage;Weak lingual manipulation;Piecemeal swallowing;Left pocketing in lateral sulci;Reduced posterior propulsion Oral Phase - Comment --  CHL IP PHARYNGEAL PHASE 06/08/2015 Pharyngeal Phase Thin;Nectar Pharyngeal- Pudding Teaspoon -- Pharyngeal -- Pharyngeal-  Pudding Cup -- Pharyngeal -- Pharyngeal- Honey Teaspoon -- Pharyngeal -- Pharyngeal- Honey Cup -- Pharyngeal -- Pharyngeal- Nectar Teaspoon Delayed swallow initiation-vallecula;Penetration/Aspiration during swallow Pharyngeal Material enters airway, remains ABOVE vocal cords and not ejected out Pharyngeal- Nectar Cup Delayed swallow initiation-vallecula;Penetration/Aspiration during swallow Pharyngeal Material enters airway, CONTACTS cords and then ejected out Pharyngeal- Nectar Straw Delayed swallow initiation-pyriform sinuses Pharyngeal -- Pharyngeal- Thin Teaspoon Penetration/Aspiration during swallow;Delayed swallow initiation-vallecula;Trace aspiration Pharyngeal Material enters airway, passes BELOW cords then ejected out Pharyngeal- Thin Cup Penetration/Aspiration during swallow;Penetration/Apiration after swallow;Delayed swallow initiation-vallecula;Pharyngeal residue - valleculae Pharyngeal Material enters airway, CONTACTS cords and not ejected out Pharyngeal- Thin Straw Penetration/Aspiration during swallow;Delayed swallow initiation-vallecula Pharyngeal Material enters airway, CONTACTS cords and not ejected out Pharyngeal- Puree Delayed swallow initiation-vallecula Pharyngeal -- Pharyngeal- Mechanical Soft Delayed swallow initiation-vallecula Pharyngeal -- Pharyngeal- Regular -- Pharyngeal -- Pharyngeal- Multi-consistency -- Pharyngeal -- Pharyngeal- Pill Penetration/Aspiration during swallow;Delayed swallow initiation-vallecula Pharyngeal Material enters airway, passes BELOW cords and not ejected out despite cough attempt by patient Pharyngeal Comment --  No flowsheet data found. Marcene Duos MA, CCC-SLP Acute  Care Speech Language Pathologist  Kennieth Rad 06/08/2015, 4:46 PM               Microbiology: Recent Results (from the past 240 hour(s))  Culture, blood (routine x 2) Call MD if unable to obtain prior to antibiotics being given     Status: None   Collection Time: 05/31/15 10:51 PM  Result Value Ref Range Status   Specimen Description BLOOD LEFT ARM  Final   Special Requests BOTTLES DRAWN AEROBIC AND ANAEROBIC 6CC  Final   Culture NO GROWTH 5 DAYS  Final   Report Status 06/05/2015 FINAL  Final  Culture, blood (routine x 2) Call MD if unable to obtain prior to antibiotics being given     Status: None   Collection Time: 05/31/15 10:55 PM  Result Value Ref Range Status   Specimen Description RIGHT ANTECUBITAL  Final   Special Requests BOTTLES DRAWN AEROBIC ONLY 6CC  Final   Culture NO GROWTH 5 DAYS  Final   Report Status 06/05/2015 FINAL  Final  Culture, sputum-assessment     Status: None   Collection Time: 06/01/15  2:16 PM  Result Value Ref Range Status   Specimen Description SPU  Final   Special Requests NONE  Final   Sputum evaluation   Final    THIS SPECIMEN IS ACCEPTABLE FOR SPUTUM CULTURE PERFORMED AT [facility]    Report Status 06/01/2015 FINAL  Final  Culture, respiratory (NON-Expectorated)     Status: None   Collection Time: 06/01/15  2:20 PM  Result Value Ref Range Status   Specimen Description SPUTUM  Final   Special Requests NONE  Final   Gram Stain   Final    RARE WBC PRESENT, PREDOMINANTLY PMN FEW SQUAMOUS EPITHELIAL CELLS PRESENT MODERATE GRAM POSITIVE COCCI IN PAIRS IN CHAINS IN CLUSTERS RARE GRAM NEGATIVE RODS Performed at [facility]    Culture   Final    NORMAL OROPHARYNGEAL FLORA Performed at [facility]    Report Status 06/04/2015 FINAL  Final     Labs: Basic Metabolic Panel:  Recent Labs Lab 06/05/15 0627 06/06/15 0632 06/07/15 0650 06/09/15 0620 06/10/15 0648  NA 139 139 138 135 136  K 3.5 3.8 3.5 3.3* 3.9  CL 108 103  97* 96* 96*  CO2 25 29 31 30  32  GLUCOSE 161* 152* 159* 181* 178*  BUN 27* 24* 24* 47* 53*  CREATININE 0.63 0.58 0.62 0.76 0.84  CALCIUM 8.3* 8.6* 8.8* 8.1* 8.2*   Liver Function Tests: No results for input(s): AST, ALT, ALKPHOS, BILITOT, PROT, ALBUMIN in the last 168 hours. No results for input(s): LIPASE, AMYLASE in the last 168 hours. No results for input(s): AMMONIA in the last 168 hours. CBC:  Recent Labs Lab 06/06/15 1345 06/09/15 0620  WBC 12.4* 19.5*  NEUTROABS 11.2* 17.5*  HGB 15.8* 13.5  HCT 46.4* 39.5  MCV 79.3 78.1  PLT 229 239   Cardiac Enzymes: No results for input(s): CKTOTAL, CKMB, CKMBINDEX, TROPONINI in the last 168 hours. BNP: BNP (last 3 results)  Recent Labs  06/01/15 0618  BNP 203.0*    ProBNP (last 3 results) No results for input(s): PROBNP in the last 8760 hours.  CBG:  Recent Labs Lab 06/09/15 1110 06/09/15 1636 06/09/15 2026 06/10/15 0721 06/10/15 1110  GLUCAP 207* 151* 219* 163* 168*       Signed:  Brittnay Pigman M  [facility] Pager: [phone] 06/10/2015, 11:46 AM

## 2015-06-10 NOTE — Clinical Social Work Placement (Addendum)
   CLINICAL SOCIAL WORK PLACEMENT  NOTE  Date:  06/10/2015  Patient Details  Name: Tara GuyJane F Marshman MRN: [id number] Date of Birth: [birth year]  Clinical Social Work is seeking post-discharge placement for this patient at the Skilled  Nursing Facility level of care (*CSW will initial, date and re-position this form in  chart as items are completed):  Yes   Patient/family provided with [facility] Clinical Social Work Department's list of facilities offering this level of care within the geographic area requested by the patient (or if unable, by the patient's family).  Yes   Patient/family informed of their freedom to choose among providers that offer the needed level of care, that participate in Medicare, Medicaid or managed care program needed by the patient, have an available bed and are willing to accept the patient.  Yes   Patient/family informed of [facility]'s ownership interest in [facility] and [facility], as well as of the fact that they are under no obligation to receive care at these facilities.  PASRR submitted to EDS on 06/06/15     PASRR number received on 06/06/15     Existing PASRR number confirmed on       FL2 transmitted to all facilities in geographic area requested by pt/family on 06/06/15     FL2 transmitted to all facilities within larger geographic area on       Patient informed that his/her managed care company has contracts with or will negotiate with certain facilities, including the following:            Patient/family informed of bed offers received.  [facility]     Physician recommends and patient chooses bed at      Patient to be [facility] on 06/10/15.  Patient to be transferred to facility by staff     Patient family notified on 06/10/15 of transfer.  Name of family member notified:  Mike-son (in pt room)     PHYSICIAN       Additional Comment:     _______________________________________________ Karn CassisStultz, Kadeen Sroka Shanaberger, LCSW 06/10/2015, 12:06 PM [phone]

## 2015-06-10 NOTE — Care Management Note (Signed)
Case Management Note  Patient Details  Name: Clint GuyJane F Okane MRN: [id number] Date of Birth: November 16, 1945  Subjective/Objective:                    Action/Plan:   Expected Discharge Date:                  Expected Discharge Plan:  Skilled Nursing Facility  In-House Referral:  Clinical Social Work  Discharge planning Services  CM Consult  Post Acute Care Choice:  NA Choice offered to:  NA  DME Arranged:    DME Agency:     HH Arranged:    HH Agency:     Status of Service:  Completed, signed off  Medicare Important Message Given:  Yes Date Medicare IM Given:    Medicare IM give by:    Date Additional Medicare IM Given:    Additional Medicare Important Message give by:     If discussed at Long Length of Stay Meetings, dates discussed:    Additional Comments: [facility] today. CSW to arrange discharge to facility. Arlyss QueenBlackwell, Katye Valek Jalaparowder, RN 06/10/2015, 12:26 PM

## 2015-06-10 NOTE — Progress Notes (Signed)
I responded to patient call for a  PRN breathing treatment and patient was asleep and appeared in no distress. SPO2 on 4L [place] 96%.

## 2015-06-10 NOTE — Care Management Important Message (Signed)
Important Message  Patient Details  Name: Tara GuyJane F Park MRN: [id number] Date of Birth: 1945-11-11   Medicare Important Message Given:  Yes    Cheryl FlashBlackwell, Jameica Couts Crowder, RN 06/10/2015, 12:25 PM

## 2015-06-10 NOTE — Progress Notes (Signed)
Pt's son Tara Park would like to be called please if there are any changes in his mother's health status. Please call [phone]

## 2015-06-11 ENCOUNTER — Inpatient Hospital Stay (HOSPITAL_COMMUNITY): Payer: Medicare Other

## 2015-06-11 ENCOUNTER — Inpatient Hospital Stay (HOSPITAL_COMMUNITY)
Admission: EM | Admit: 2015-06-11 | Discharge: 2015-06-14 | DRG: 190 | Disposition: A | Discharge status: Skilled Nursing Facility | Payer: Medicare Other | Attending: Internal Medicine | Admitting: Internal Medicine

## 2015-06-11 ENCOUNTER — Emergency Department (HOSPITAL_COMMUNITY): Payer: Medicare Other

## 2015-06-11 ENCOUNTER — Encounter (HOSPITAL_COMMUNITY): Payer: Self-pay | Admitting: *Deleted

## 2015-06-11 DIAGNOSIS — I2489 Other forms of acute ischemic heart disease: Secondary | ICD-10-CM | POA: Diagnosis present

## 2015-06-11 DIAGNOSIS — Z8249 Family history of ischemic heart disease and other diseases of the circulatory system: Secondary | ICD-10-CM

## 2015-06-11 DIAGNOSIS — N390 Urinary tract infection, site not specified: Secondary | ICD-10-CM | POA: Diagnosis present

## 2015-06-11 DIAGNOSIS — R1310 Dysphagia, unspecified: Secondary | ICD-10-CM | POA: Diagnosis present

## 2015-06-11 DIAGNOSIS — J9621 Acute and chronic respiratory failure with hypoxia: Secondary | ICD-10-CM | POA: Diagnosis present

## 2015-06-11 DIAGNOSIS — Z7982 Long term (current) use of aspirin: Secondary | ICD-10-CM | POA: Diagnosis not present

## 2015-06-11 DIAGNOSIS — I248 Other forms of acute ischemic heart disease: Secondary | ICD-10-CM | POA: Diagnosis present

## 2015-06-11 DIAGNOSIS — E119 Type 2 diabetes mellitus without complications: Secondary | ICD-10-CM | POA: Diagnosis present

## 2015-06-11 DIAGNOSIS — J9611 Chronic respiratory failure with hypoxia: Secondary | ICD-10-CM | POA: Diagnosis not present

## 2015-06-11 DIAGNOSIS — Z7902 Long term (current) use of antithrombotics/antiplatelets: Secondary | ICD-10-CM

## 2015-06-11 DIAGNOSIS — Z9981 Dependence on supplemental oxygen: Secondary | ICD-10-CM | POA: Diagnosis not present

## 2015-06-11 DIAGNOSIS — J690 Pneumonitis due to inhalation of food and vomit: Secondary | ICD-10-CM | POA: Diagnosis not present

## 2015-06-11 DIAGNOSIS — I513 Intracardiac thrombosis, not elsewhere classified: Secondary | ICD-10-CM | POA: Diagnosis present

## 2015-06-11 DIAGNOSIS — F17210 Nicotine dependence, cigarettes, uncomplicated: Secondary | ICD-10-CM | POA: Diagnosis present

## 2015-06-11 DIAGNOSIS — I639 Cerebral infarction, unspecified: Secondary | ICD-10-CM | POA: Diagnosis present

## 2015-06-11 DIAGNOSIS — D649 Anemia, unspecified: Secondary | ICD-10-CM | POA: Diagnosis present

## 2015-06-11 DIAGNOSIS — R400 Somnolence: Secondary | ICD-10-CM | POA: Diagnosis present

## 2015-06-11 DIAGNOSIS — I69354 Hemiplegia and hemiparesis following cerebral infarction affecting left non-dominant side: Secondary | ICD-10-CM | POA: Diagnosis not present

## 2015-06-11 DIAGNOSIS — E785 Hyperlipidemia, unspecified: Secondary | ICD-10-CM | POA: Diagnosis present

## 2015-06-11 DIAGNOSIS — Z720 Tobacco use: Secondary | ICD-10-CM | POA: Diagnosis present

## 2015-06-11 DIAGNOSIS — Z79899 Other long term (current) drug therapy: Secondary | ICD-10-CM

## 2015-06-11 DIAGNOSIS — B370 Candidal stomatitis: Secondary | ICD-10-CM | POA: Diagnosis present

## 2015-06-11 DIAGNOSIS — I255 Ischemic cardiomyopathy: Secondary | ICD-10-CM | POA: Diagnosis present

## 2015-06-11 DIAGNOSIS — J440 Chronic obstructive pulmonary disease with acute lower respiratory infection: Principal | ICD-10-CM | POA: Diagnosis present

## 2015-06-11 DIAGNOSIS — I10 Essential (primary) hypertension: Secondary | ICD-10-CM | POA: Diagnosis present

## 2015-06-11 DIAGNOSIS — J449 Chronic obstructive pulmonary disease, unspecified: Secondary | ICD-10-CM | POA: Diagnosis present

## 2015-06-11 DIAGNOSIS — E876 Hypokalemia: Secondary | ICD-10-CM | POA: Diagnosis present

## 2015-06-11 DIAGNOSIS — Z7401 Bed confinement status: Secondary | ICD-10-CM | POA: Diagnosis not present

## 2015-06-11 DIAGNOSIS — R29810 Facial weakness: Secondary | ICD-10-CM | POA: Diagnosis present

## 2015-06-11 DIAGNOSIS — R471 Dysarthria and anarthria: Secondary | ICD-10-CM | POA: Diagnosis present

## 2015-06-11 DIAGNOSIS — R0602 Shortness of breath: Secondary | ICD-10-CM

## 2015-06-11 DIAGNOSIS — I5022 Chronic systolic (congestive) heart failure: Secondary | ICD-10-CM | POA: Diagnosis not present

## 2015-06-11 DIAGNOSIS — I213 ST elevation (STEMI) myocardial infarction of unspecified site: Secondary | ICD-10-CM | POA: Diagnosis not present

## 2015-06-11 LAB — TROPONIN I
Troponin I: 0.2 ng/mL — ABNORMAL HIGH (ref <0.031)
Troponin I: 0.17 ng/mL — ABNORMAL HIGH (ref <0.031)
Troponin I: 0.15 ng/mL — ABNORMAL HIGH (ref <0.031)

## 2015-06-11 LAB — CBC WITH DIFFERENTIAL/PLATELET
Basophils Absolute: 0 10*3/uL (ref 0.0–0.1)
Basophils Relative: 0 %
Eosinophils Absolute: 0 10*3/uL (ref 0.0–0.7)
Eosinophils Relative: 0 %
HCT: 37.5 % (ref 36.0–46.0)
Hemoglobin: 12.6 g/dL (ref 12.0–15.0)
Lymphocytes Relative: 6 %
Lymphs Abs: 1.6 10*3/uL (ref 0.7–4.0)
MCH: 26.8 pg (ref 26.0–34.0)
MCHC: 33.6 g/dL (ref 30.0–36.0)
MCV: 79.6 fL (ref 78.0–100.0)
Monocytes Absolute: 2.2 10*3/uL — ABNORMAL HIGH (ref 0.1–1.0)
Monocytes Relative: 8 %
Neutro Abs: 24.3 10*3/uL — ABNORMAL HIGH (ref 1.7–7.7)
Neutrophils Relative %: 87 %
Platelets: 191 10*3/uL (ref 150–400)
RBC: 4.71 MIL/uL (ref 3.87–5.11)
RDW: 13.4 % (ref 11.5–15.5)
WBC: 28.1 10*3/uL — ABNORMAL HIGH (ref 4.0–10.5)

## 2015-06-11 LAB — BRAIN NATRIURETIC PEPTIDE: B Natriuretic Peptide: 193 pg/mL — ABNORMAL HIGH (ref 0.0–100.0)

## 2015-06-11 LAB — URINALYSIS, ROUTINE W REFLEX MICROSCOPIC
Bilirubin Urine: NEGATIVE
Glucose, UA: NEGATIVE mg/dL
Ketones, ur: NEGATIVE mg/dL
Nitrite: POSITIVE — AB
Protein, ur: NEGATIVE mg/dL
Specific Gravity, Urine: 1.027 (ref 1.005–1.030)
pH: 6 (ref 5.0–8.0)

## 2015-06-11 LAB — BASIC METABOLIC PANEL
Anion gap: 7 (ref 5–15)
BUN: 33 mg/dL — ABNORMAL HIGH (ref 6–20)
CO2: 34 mmol/L — ABNORMAL HIGH (ref 22–32)
Calcium: 8.4 mg/dL — ABNORMAL LOW (ref 8.9–10.3)
Chloride: 92 mmol/L — ABNORMAL LOW (ref 101–111)
Creatinine, Ser: 0.64 mg/dL (ref 0.44–1.00)
GFR calc Af Amer: >60 mL/min (ref >60)
GFR calc non Af Amer: >60 mL/min (ref >60)
Glucose, Bld: 162 mg/dL — ABNORMAL HIGH (ref 65–99)
Potassium: 3.2 mmol/L — ABNORMAL LOW (ref 3.5–5.1)
Sodium: 133 mmol/L — ABNORMAL LOW (ref 135–145)

## 2015-06-11 LAB — CREATININE, SERUM
Creatinine, Ser: 0.69 mg/dL (ref 0.44–1.00)
GFR calc Af Amer: >60 mL/min (ref >60)
GFR calc non Af Amer: >60 mL/min (ref >60)

## 2015-06-11 LAB — CBC
HCT: 33.3 % — ABNORMAL LOW (ref 36.0–46.0)
Hemoglobin: 11.2 g/dL — ABNORMAL LOW (ref 12.0–15.0)
MCH: 26.8 pg (ref 26.0–34.0)
MCHC: 33.6 g/dL (ref 30.0–36.0)
MCV: 79.7 fL (ref 78.0–100.0)
Platelets: 189 10*3/uL (ref 150–400)
RBC: 4.18 MIL/uL (ref 3.87–5.11)
RDW: 13.5 % (ref 11.5–15.5)
WBC: 24.9 10*3/uL — ABNORMAL HIGH (ref 4.0–10.5)

## 2015-06-11 LAB — DIGOXIN LEVEL: Digoxin Level: 0.5 ng/mL — ABNORMAL LOW (ref 0.8–2.0)

## 2015-06-11 LAB — GLUCOSE, CAPILLARY
GLUCOSE-CAPILLARY: 151 mg/dL — AB (ref 65–99)
Glucose-Capillary: 167 mg/dL — ABNORMAL HIGH (ref 65–99)

## 2015-06-11 LAB — URINE MICROSCOPIC-ADD ON

## 2015-06-11 LAB — MAGNESIUM: Magnesium: 2.3 mg/dL (ref 1.7–2.4)

## 2015-06-11 LAB — CBG MONITORING, ED: GLUCOSE-CAPILLARY: 181 mg/dL — AB (ref 65–99)

## 2015-06-11 MED ORDER — CLONIDINE HCL 0.2 MG PO TABS
0.2000 mg | ORAL_TABLET | Freq: Two times a day (BID) | ORAL | Status: DC
  Administered 2015-06-11: 0.2 mg via ORAL
  Filled 2015-06-11: qty 1

## 2015-06-11 MED ORDER — SODIUM CHLORIDE 0.9 % IJ SOLN
3.0000 mL | Freq: Two times a day (BID) | INTRAMUSCULAR | Status: DC
  Administered 2015-06-11 – 2015-06-14 (×3): 3 mL via INTRAVENOUS

## 2015-06-11 MED ORDER — SODIUM CHLORIDE 0.9 % IJ SOLN: 3.0000 mL | INTRAMUSCULAR | Status: DC | PRN

## 2015-06-11 MED ORDER — PRAVASTATIN SODIUM 40 MG PO TABS
40.0000 mg | ORAL_TABLET | Freq: Every evening | ORAL | Status: DC
  Administered 2015-06-11 – 2015-06-12 (×2): 40 mg via ORAL
  Filled 2015-06-11 (×2): qty 1

## 2015-06-11 MED ORDER — CHLORHEXIDINE GLUCONATE 0.12 % MT SOLN
15.0000 mL | Freq: Two times a day (BID) | OROMUCOSAL | Status: DC
Start: 2015-06-11 — End: 2015-06-14
  Administered 2015-06-11 – 2015-06-14 (×6): 15 mL via OROMUCOSAL
  Filled 2015-06-11 (×6): qty 15

## 2015-06-11 MED ORDER — METHYLPREDNISOLONE SODIUM SUCC 125 MG IJ SOLR
80.0000 mg | Freq: Once | INTRAMUSCULAR | Status: AC
  Administered 2015-06-11: 80 mg via INTRAVENOUS
  Filled 2015-06-11: qty 2

## 2015-06-11 MED ORDER — VANCOMYCIN HCL 10 G IV SOLR
1250.0000 mg | Freq: Once | INTRAVENOUS | Status: AC
  Administered 2015-06-11: 1250 mg via INTRAVENOUS
  Filled 2015-06-11: qty 1250

## 2015-06-11 MED ORDER — ONDANSETRON HCL 4 MG PO TABS: 4.0000 mg | ORAL_TABLET | Freq: Four times a day (QID) | ORAL | Status: DC | PRN

## 2015-06-11 MED ORDER — LISINOPRIL 10 MG PO TABS: 10.0000 mg | ORAL_TABLET | Freq: Every morning | ORAL | Status: DC

## 2015-06-11 MED ORDER — CETYLPYRIDINIUM CHLORIDE 0.05 % MT LIQD
7.0000 mL | Freq: Two times a day (BID) | OROMUCOSAL | Status: DC
  Administered 2015-06-12 – 2015-06-14 (×5): 7 mL via OROMUCOSAL

## 2015-06-11 MED ORDER — INSULIN ASPART 100 UNIT/ML ~~LOC~~ SOLN: 0.0000 [IU] | Freq: Three times a day (TID) | SUBCUTANEOUS | Status: DC

## 2015-06-11 MED ORDER — POTASSIUM CHLORIDE CRYS ER 20 MEQ PO TBCR
40.0000 meq | EXTENDED_RELEASE_TABLET | Freq: Once | ORAL | Status: AC
  Administered 2015-06-11: 40 meq via ORAL
  Filled 2015-06-11: qty 2

## 2015-06-11 MED ORDER — ACETAMINOPHEN 325 MG PO TABS
650.0000 mg | ORAL_TABLET | Freq: Four times a day (QID) | ORAL | Status: DC | PRN
  Administered 2015-06-13 (×2): 650 mg via ORAL
  Filled 2015-06-11 (×2): qty 2

## 2015-06-11 MED ORDER — MORPHINE SULFATE (PF) 4 MG/ML IV SOLN
4.0000 mg | Freq: Once | INTRAVENOUS | Status: AC
  Administered 2015-06-11: 4 mg via INTRAVENOUS
  Filled 2015-06-11: qty 1

## 2015-06-11 MED ORDER — SENNOSIDES-DOCUSATE SODIUM 8.6-50 MG PO TABS: 1.0000 | ORAL_TABLET | Freq: Every evening | ORAL | Status: DC | PRN

## 2015-06-11 MED ORDER — ASPIRIN EC 81 MG PO TBEC
81.0000 mg | DELAYED_RELEASE_TABLET | Freq: Every day | ORAL | Status: DC
  Administered 2015-06-12: 81 mg via ORAL
  Filled 2015-06-11: qty 1

## 2015-06-11 MED ORDER — ONDANSETRON HCL 4 MG/2ML IJ SOLN: 4.0000 mg | Freq: Four times a day (QID) | INTRAMUSCULAR | Status: DC | PRN

## 2015-06-11 MED ORDER — TRAZODONE HCL 50 MG PO TABS
50.0000 mg | ORAL_TABLET | Freq: Every day | ORAL | Status: DC
  Administered 2015-06-11: 50 mg via ORAL
  Filled 2015-06-11: qty 1

## 2015-06-11 MED ORDER — ACETAMINOPHEN 650 MG RE SUPP: 650.0000 mg | Freq: Four times a day (QID) | RECTAL | Status: DC | PRN

## 2015-06-11 MED ORDER — INSULIN ASPART 100 UNIT/ML ~~LOC~~ SOLN: 0.0000 [IU] | Freq: Every day | SUBCUTANEOUS | Status: DC

## 2015-06-11 MED ORDER — FUROSEMIDE 20 MG PO TABS
20.0000 mg | ORAL_TABLET | Freq: Every day | ORAL | Status: DC
  Administered 2015-06-12 – 2015-06-14 (×3): 20 mg via ORAL
  Filled 2015-06-11 (×3): qty 1

## 2015-06-11 MED ORDER — ISOSORBIDE MONONITRATE ER 30 MG PO TB24: 30.0000 mg | ORAL_TABLET | Freq: Every day | ORAL | Status: DC

## 2015-06-11 MED ORDER — STARCH (THICKENING) PO POWD
ORAL | Status: DC | PRN
  Filled 2015-06-11: qty 227

## 2015-06-11 MED ORDER — IPRATROPIUM-ALBUTEROL 0.5-2.5 (3) MG/3ML IN SOLN
3.0000 mL | Freq: Once | RESPIRATORY_TRACT | Status: AC
  Administered 2015-06-11: 3 mL via RESPIRATORY_TRACT
  Filled 2015-06-11: qty 3

## 2015-06-11 MED ORDER — CLOPIDOGREL BISULFATE 75 MG PO TABS
75.0000 mg | ORAL_TABLET | Freq: Every day | ORAL | Status: DC
  Administered 2015-06-12 – 2015-06-14 (×3): 75 mg via ORAL
  Filled 2015-06-11 (×3): qty 1

## 2015-06-11 MED ORDER — SODIUM CHLORIDE 0.9 % IJ SOLN
3.0000 mL | Freq: Two times a day (BID) | INTRAMUSCULAR | Status: DC
Start: 2015-06-11 — End: 2015-06-14
  Administered 2015-06-14: 3 mL via INTRAVENOUS

## 2015-06-11 MED ORDER — ENOXAPARIN SODIUM 40 MG/0.4ML ~~LOC~~ SOLN
40.0000 mg | SUBCUTANEOUS | Status: DC
  Administered 2015-06-11 – 2015-06-13 (×3): 40 mg via SUBCUTANEOUS
  Filled 2015-06-11 (×3): qty 0.4

## 2015-06-11 MED ORDER — PIPERACILLIN-TAZOBACTAM 3.375 G IVPB
3.3750 g | Freq: Three times a day (TID) | INTRAVENOUS | Status: DC
  Filled 2015-06-11 (×2): qty 50

## 2015-06-11 MED ORDER — IPRATROPIUM-ALBUTEROL 20-100 MCG/ACT IN AERS: 2.0000 | INHALATION_SPRAY | Freq: Four times a day (QID) | RESPIRATORY_TRACT | Status: DC

## 2015-06-11 MED ORDER — SODIUM CHLORIDE 0.9 % IV SOLN
3.0000 g | Freq: Three times a day (TID) | INTRAVENOUS | Status: DC
  Administered 2015-06-11 – 2015-06-14 (×9): 3 g via INTRAVENOUS
  Filled 2015-06-11 (×11): qty 3

## 2015-06-11 MED ORDER — PIPERACILLIN-TAZOBACTAM 3.375 G IVPB 30 MIN
3.3750 g | Freq: Three times a day (TID) | INTRAVENOUS | Status: DC
  Administered 2015-06-11: 3.375 g via INTRAVENOUS
  Filled 2015-06-11 (×3): qty 50

## 2015-06-11 MED ORDER — ALBUTEROL SULFATE (2.5 MG/3ML) 0.083% IN NEBU
2.5000 mg | INHALATION_SOLUTION | Freq: Four times a day (QID) | RESPIRATORY_TRACT | Status: DC | PRN
Start: 2015-06-11 — End: 2015-06-14
  Administered 2015-06-12 – 2015-06-13 (×3): 2.5 mg via RESPIRATORY_TRACT
  Filled 2015-06-11 (×3): qty 3

## 2015-06-11 MED ORDER — VANCOMYCIN HCL 500 MG IV SOLR
500.0000 mg | Freq: Two times a day (BID) | INTRAVENOUS | Status: DC
  Filled 2015-06-11 (×4): qty 500

## 2015-06-11 MED ORDER — INSULIN ASPART 100 UNIT/ML ~~LOC~~ SOLN
4.0000 [IU] | Freq: Three times a day (TID) | SUBCUTANEOUS | Status: DC
Start: 2015-06-11 — End: 2015-06-12

## 2015-06-11 MED ORDER — IPRATROPIUM-ALBUTEROL 0.5-2.5 (3) MG/3ML IN SOLN
3.0000 mL | Freq: Four times a day (QID) | RESPIRATORY_TRACT | Status: DC
  Administered 2015-06-11 – 2015-06-14 (×10): 3 mL via RESPIRATORY_TRACT
  Filled 2015-06-11 (×10): qty 3

## 2015-06-11 MED ORDER — SODIUM CHLORIDE 0.9 % IV SOLN: 250.0000 mL | INTRAVENOUS | Status: DC | PRN

## 2015-06-11 NOTE — ED Notes (Signed)
Pt comes from [facility] (with recent dx of ischemic stroke). Pt comes in with difficulty breathing. Pt is usually on 4L oxygen, saturation at 93% upon EMS arrival. Pt denies any pain, except for right foot.

## 2015-06-11 NOTE — ED Notes (Signed)
RT called for humidifier, patient's nose bleeding.

## 2015-06-11 NOTE — ED Notes (Signed)
[facility] with [facility] now.

## 2015-06-11 NOTE — H&P (Signed)
[facility]          History and Physical    PCP:   Maricela Curet, MD   EDP: Virgel Manifold, M.D.  Chief Complaint:  Shortness of breath   HPI: Patient is a 69 year old woman with multiple medical comorbidities including COPD on 4 L of chronic oxygen, hypertension, diabetes, tobacco abuse who was just discharged from the hospital yesterday after suffering an acute ischemic CVA. While at her skilled nursing facility she states she was very short of breath last night and today and finally asked to be transferred to the emergency department for evaluation. Chest x-ray shows a developing infiltrate in the right lung base, lab workup is indicative of hypokalemia with a potassium of 3.2, WBC count of 28.1, troponin is 0.20. Interestingly, during her workup for stroke a 2-D echo was performed on 06/02/2015 that showed a severely reduced ejection fraction in the range of 25-30%, akinesis of the apical lateral septal, anterior, inferior and apical myocardium. Findings suggestive of an ischemic cardiomyopathy. There is stranding and trabeculation at the LV apex which could potentially be a source of embolic stroke, possibility of thrombus is not excluded. We are asked to admit her for further evaluation and management. Patient denies cough, chest pain, nausea, vomiting.  Allergies:  No Known Allergies    Past Medical History  Diagnosis Date  . Diabetes mellitus without complication ([place])   . Hypertension   . Tobacco abuse   . Use of cane as ambulatory aid   . Leg edema, right     chronic    Past Surgical History  Procedure Laterality Date  . Cataract extraction    . Hip surgery    . Foot surgery      Prior to Admission medications   Medication Sig Start Date End Date Taking? Authorizing Provider  albuterol (PROVENTIL) (2.5 MG/3ML) 0.083% nebulizer solution Take 2.5 mg by nebulization every 6 (six) hours as needed for wheezing or shortness of breath.   Yes  Historical Provider, MD  aspirin EC 81 MG tablet Take 81 mg by mouth daily.   Yes Historical Provider, MD  cloNIDine (CATAPRES) 0.2 MG tablet Take 1 tablet (0.2 mg total) by mouth 2 (two) times daily. 06/10/15  Yes Lucia Gaskins, MD  clopidogrel (PLAVIX) 75 MG tablet Take 1 tablet (75 mg total) by mouth daily with breakfast. 06/10/15  Yes Lucia Gaskins, MD  COMBIVENT RESPIMAT 20-100 MCG/ACT AERS respimat Inhale 2 puffs into the lungs 4 (four) times daily.  05/23/15  Yes Historical Provider, MD  digoxin (LANOXIN) 0.125 MG tablet Take 1 tablet (0.125 mg total) by mouth daily. 06/10/15  Yes Lucia Gaskins, MD  furosemide (LASIX) 20 MG tablet Take 1 tablet (20 mg total) by mouth daily. 06/10/15  Yes Lucia Gaskins, MD  HYDROcodone-acetaminophen (NORCO) 5-325 MG tablet Take 1 tablet by mouth every 6 (six) hours as needed for moderate pain. 06/10/15  Yes Lucia Gaskins, MD  isosorbide mononitrate (IMDUR) 30 MG 24 hr tablet Take 1 tablet (30 mg total) by mouth daily. 06/10/15  Yes Lucia Gaskins, MD  lisinopril (PRINIVIL,ZESTRIL) 10 MG tablet Take 10 mg by mouth every morning.   Yes Historical Provider, MD  metFORMIN (GLUCOPHAGE) 500 MG tablet Take 500 mg by mouth 2 (two) times daily with a meal.   Yes Historical Provider, MD  pravastatin (PRAVACHOL) 40 MG tablet Take 40 mg by mouth every evening.   Yes Historical  Provider, MD  predniSONE (DELTASONE) 50 MG tablet Take 1 tablet (50 mg total) by mouth daily with breakfast. Patient taking differently: Take 50 mg by mouth daily with breakfast. Starting 06/11/2015 x 7 days. 06/11/15  Yes Lucia Gaskins, MD  traZODone (DESYREL) 50 MG tablet Take 50 mg by mouth at bedtime. 05/09/15  Yes Historical Provider, MD  aspirin 300 MG suppository Place 1 suppository (300 mg total) rectally daily. Patient not taking: Reported on 06/11/2015 06/10/15   Lucia Gaskins, MD    Social History:  reports that she has been smoking Cigarettes.  She does not have  any smokeless tobacco history on file. She reports that she does not drink alcohol or use illicit drugs.  Family History  Problem Relation Age of Onset  . Transient ischemic attack Mother   . COPD Father   . Heart disease Brother     Review of Systems:  Constitutional: Denies fever, chills, diaphoresis, appetite change and fatigue.  HEENT: Denies photophobia, eye pain, redness, hearing loss, ear pain, congestion, sore throat, rhinorrhea, sneezing, mouth sores, trouble swallowing, neck pain, neck stiffness and tinnitus.   Respiratory: Denies , cough, chest tightness,  and wheezing.   Cardiovascular: Denies chest pain, palpitations and leg swelling.  Gastrointestinal: Denies nausea, vomiting, abdominal pain, diarrhea, constipation, blood in stool and abdominal distention.  Genitourinary: Denies dysuria, urgency, frequency, hematuria, flank pain and difficulty urinating.  Endocrine: Denies: hot or cold intolerance, sweats, changes in hair or nails, polyuria, polydipsia. Musculoskeletal: Denies myalgias, back pain, joint swelling, arthralgias and gait problem.  Skin: Denies pallor, rash and wound.  Neurological: Denies dizziness, seizures, syncope, weakness, light-headedness, numbness and headaches.  Hematological: Denies adenopathy. Easy bruising, personal or family bleeding history  Psychiatric/Behavioral: Denies suicidal ideation, mood changes, confusion, nervousness, sleep disturbance and agitation   Physical Exam: Blood pressure 120/66, pulse 97, temperature 98 F (36.7 C), temperature source Oral, resp. rate 16, weight 63.504 kg (140 lb), SpO2 94 %. Gen: Alert, awake, oriented 3, significant aphasia and slurred speech HEENT: Normocephalic, atraumatic, pupils equal round and reactive, dry mucous membranes, poor dentition Neck: Supple, no JVD, no lymphadenopathy, no bruits, no goiter Cardiovascular: Regular rate and rhythm, no murmurs, rubs or gallops Lungs: Clear to auscultation  bilaterally, no wheezes or rhonchi Abdomen: Soft, nontender, nondistended, positive bowel sounds Extremities: 1+ pitting edema bilaterally up to knees, positive pulses Neurologic: Mental status intact, cranial nerves intact , left-sided weakness   Labs on Admission:  Results for orders placed or performed during the hospital encounter of 06/11/15 (from the past 48 hour(s))  CBC with Differential     Status: Abnormal   Collection Time: 06/11/15  9:53 AM  Result Value Ref Range   WBC 28.1 (H) 4.0 - 10.5 K/uL   RBC 4.71 3.87 - 5.11 MIL/uL   Hemoglobin 12.6 12.0 - 15.0 g/dL   HCT 37.5 36.0 - 46.0 %   MCV 79.6 78.0 - 100.0 fL   MCH 26.8 26.0 - 34.0 pg   MCHC 33.6 30.0 - 36.0 g/dL   RDW 13.4 11.5 - 15.5 %   Platelets 191 150 - 400 K/uL   Neutrophils Relative % 87 %   Neutro Abs 24.3 (H) 1.7 - 7.7 K/uL   Lymphocytes Relative 6 %   Lymphs Abs 1.6 0.7 - 4.0 K/uL   Monocytes Relative 8 %   Monocytes Absolute 2.2 (H) 0.1 - 1.0 K/uL   Eosinophils Relative 0 %   Eosinophils Absolute 0.0 0.0 - 0.7 K/uL   Basophils  Relative 0 %   Basophils Absolute 0.0 0.0 - 0.1 K/uL  Basic metabolic panel     Status: Abnormal   Collection Time: 06/11/15  9:53 AM  Result Value Ref Range   Sodium 133 (L) 135 - 145 mmol/L   Potassium 3.2 (L) 3.5 - 5.1 mmol/L    Comment: DELTA CHECK NOTED   Chloride 92 (L) 101 - 111 mmol/L   CO2 34 (H) 22 - 32 mmol/L   Glucose, Bld 162 (H) 65 - 99 mg/dL   BUN 33 (H) 6 - 20 mg/dL   Creatinine, Ser 0.64 0.44 - 1.00 mg/dL   Calcium 8.4 (L) 8.9 - 10.3 mg/dL   GFR calc non Af Amer >60 >60 mL/min   GFR calc Af Amer >60 >60 mL/min    Comment: (NOTE) The eGFR has been calculated using the CKD EPI equation. This calculation has not been validated in all clinical situations. eGFR's persistently <60 mL/min signify possible Chronic Kidney Disease.    Anion gap 7 5 - 15  Troponin I     Status: Abnormal   Collection Time: 06/11/15  9:53 AM  Result Value Ref Range   Troponin I  0.20 (H) <0.031 ng/mL    Comment:        PERSISTENTLY INCREASED TROPONIN VALUES IN THE RANGE OF 0.04-0.49 ng/mL CAN BE SEEN IN:       -UNSTABLE ANGINA       -CONGESTIVE HEART FAILURE       -MYOCARDITIS       -CHEST TRAUMA       -ARRYHTHMIAS       -LATE PRESENTING MYOCARDIAL INFARCTION       -COPD   CLINICAL FOLLOW-UP RECOMMENDED.   Brain natriuretic peptide     Status: Abnormal   Collection Time: 06/11/15  9:53 AM  Result Value Ref Range   B Natriuretic Peptide 193.0 (H) 0.0 - 100.0 pg/mL  Digoxin level     Status: Abnormal   Collection Time: 06/11/15  9:53 AM  Result Value Ref Range   Digoxin Level 0.5 (L) 0.8 - 2.0 ng/mL    Radiological Exams on Admission: Dg Chest Portable 1 View  06/11/2015  CLINICAL DATA:  Worsening shortness of breath. History of hypertension. [birth year]06/1961 EXAM: PORTABLE CHEST 1 VIEW COMPARISON:  None. FINDINGS: Streaky opacity in the right lung base is mildly increased. Other areas of bronchial wall thickening interstitial prominence are stable. Lungs are mildly hyperexpanded. No pleural effusion or pneumothorax. No evidence of pulmonary edema. Cardiac silhouette is normal in size and configuration. No mediastinal or hilar masses or convincing adenopathy. Bony thorax is demineralized but grossly intact. IMPRESSION: Mild increase in opacity at the right lung base. The apparent difference from the prior exam may be technical due to differences in patient positioning and lung volume. However, given the worsening symptoms, a developing bronchopneumonia should be considered. No evidence of pulmonary edema. No other change. Electronically Signed   By: Lajean Manes M.D.   On: 06/11/2015 10:23    Assessment/Plan Principal Problem:   Aspiration pneumonia (HCC) Active Problems:   Tobacco abuse   Diabetes mellitus without complication ([place])   Hypertension   Acute ischemic stroke ([place])   Demand ischemia (HCC)   Hypokalemia   Chronic systolic CHF (congestive heart  failure) (HCC)   Mural thrombus of cardiac apex (HCC)   COPD (chronic obstructive pulmonary disease) (HCC)   Chronic hypoxemic respiratory failure (HCC)    Aspiration pneumonia -In face of recent ischemic stroke  and dysphagia, findings of shortness of breath and leukocytosis and right basilar infiltrate on chest x-ray are strongly indicative of aspiration pneumonia. -We'll start on Unasyn per pharmacy. -Blood and sputum cultures have been ordered. -May benefit from continued ST evaluation at [facility]. -Prior diet recommendation: D2 with nectar thick liquids.  Acute on chronic hypoxemic respiratory failure -Likely due to aspiration pneumonia on top of baseline COPD -Is currently on her chronic oxygen requirement of 4 L.  Chronic systolic CHF -Appears mostly compensated at present. -This appears to be a new finding from her prior admission that has not been addressed as of yet. -We'll likely need ischemic evaluation in the future, although likely needs to be deferred until further workup for her recent CVA is completed. -Continue lisinopril, Lasix, imdur, aspirin, statin, defer use of beta blocker at present due to severe COPD.  Recent CVA -Was presumed to be ischemic, however, careful review of 2-D echo is unable to exclude intraluminal thrombus. -In light of these findings, I believe she needs a TEE to completely exclude this. If thrombus is present she should be fully anticoagulated on Coumadin for secondary stroke prevention. -For now will continue aspirin and Plavix as was recommended by neurology on discharge. -EDP has discussed with cardiology at [facility], Dr. Benjamine Mola. Due to lack of cardiology coverage this weekend patient will be transferred.  Elevated troponin -Likely represents demand ischemia in face of acute aspiration pneumonia and markedly decreased ejection fraction. -Initial troponin is 0.20, will cycle. -EKG is difficult to interpret given wandering baseline,  however there appear to be some T-wave inversions in the inferior and lateral leads.  Hypokalemia -Replete orally and check magnesium level.  Diabetes mellitus -Check hemoglobin A1c, hold metformin and place on sliding scale insulin.  Tobacco abuse -Counseled on cessation -We'll provide nicotine patch.  DVT prophylaxis -Lovenox  CODE STATUS -Full code     Time Spent on Admission: 95 minutes  HERNANDEZ ACOSTA,ESTELA [facility] Pager: [phone] 06/11/2015, 2:15 PM

## 2015-06-11 NOTE — ED Notes (Signed)
[facility] here for transport.  

## 2015-06-11 NOTE — Progress Notes (Signed)
Pt family member states pt has: Metal in right hip, right leg, and right ankle. Corrective lens implants in both eyes.

## 2015-06-11 NOTE — ED Notes (Signed)
RT at bedside.

## 2015-06-11 NOTE — Consult Note (Signed)
NEURO HOSPITALIST CONSULT NOTE   Requestig physician: Dr. Ardyth Harps   Reason for Consult: Clearance to start anticoagulation  HPI:                                                                                                                                          Tara Park is an 69 y.o. female admitted with pneumonia to the hospitalist service. Her recent transthoracic echocardiogram showed a cardiac thrombus. 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 Neurology service is consulted to obtain clearance to start anticoagulation in setting of recent acute stroke. She is currently on aspirin and Plavix.  No new neurological symptoms in the past few days except for the residual left hemiplegia since the stroke on 06/02/2015. Her stroke workup was done at an outside facility.  Past Medical History  Diagnosis Date  . Diabetes mellitus without complication (HCC)   . Hypertension   . Tobacco abuse   . Use of cane as ambulatory aid   . Leg edema, right     chronic    Past Surgical History  Procedure Laterality Date  . Cataract extraction    . Hip surgery    . Foot surgery      Family History  Problem Relation Age of Onset  . Transient ischemic attack Mother   . COPD Father   . Heart disease Brother     Family History: CAD, HTN  Social History:  reports that she has been smoking Cigarettes.  She does not have any smokeless tobacco history on file. She reports that she does not drink alcohol or use illicit drugs.  No Known Allergies  MEDICATIONS:                                                                                                                     Prior to Admission:  Prescriptions prior to admission  Medication Sig Dispense Refill Last Dose  . albuterol (PROVENTIL) (2.5 MG/3ML) 0.083% nebulizer solution Take 2.5 mg by nebulization every 6 (six) hours as needed for wheezing or shortness of breath.    06/10/2015 at Unknown time  . aspirin EC 81 MG tablet Take 81 mg by mouth daily.   06/11/2015 at Unknown time  .  cloNIDine (CATAPRES) 0.2 MG tablet Take 1 tablet (0.2 mg total) by mouth 2 (two) times daily. 60 tablet 1 06/11/2015 at Unknown time  . clopidogrel (PLAVIX) 75 MG tablet Take 1 tablet (75 mg total) by mouth daily with breakfast. 30 tablet 3 06/11/2015 at 0830  . COMBIVENT RESPIMAT 20-100 MCG/ACT AERS respimat Inhale 2 puffs into the lungs 4 (four) times daily.    06/11/2015 at Unknown time  . digoxin (LANOXIN) 0.125 MG tablet Take 1 tablet (0.125 mg total) by mouth daily. 30 tablet 1 06/11/2015 at Unknown time  . furosemide (LASIX) 20 MG tablet Take 1 tablet (20 mg total) by mouth daily. 30 tablet 0 06/11/2015 at Unknown time  . HYDROcodone-acetaminophen (NORCO) 5-325 MG tablet Take 1 tablet by mouth every 6 (six) hours as needed for moderate pain. 30 tablet 0 06/10/2015 at Unknown time  . isosorbide mononitrate (IMDUR) 30 MG 24 hr tablet Take 1 tablet (30 mg total) by mouth daily. 30 tablet 1 06/11/2015 at Unknown time  . lisinopril (PRINIVIL,ZESTRIL) 10 MG tablet Take 10 mg by mouth every morning.   06/11/2015 at Unknown time  . metFORMIN (GLUCOPHAGE) 500 MG tablet Take 500 mg by mouth 2 (two) times daily with a meal.   06/11/2015 at Unknown time  . pravastatin (PRAVACHOL) 40 MG tablet Take 40 mg by mouth every evening.   06/10/2015 at Unknown time  . predniSONE (DELTASONE) 50 MG tablet Take 1 tablet (50 mg total) by mouth daily with breakfast. (Patient taking differently: Take 50 mg by mouth daily with breakfast. Starting 06/11/2015 x 7 days.) 7 tablet 0 06/11/2015 at Unknown time  . traZODone (DESYREL) 50 MG tablet Take 50 mg by mouth at bedtime.   06/10/2015 at Unknown time  . aspirin 300 MG suppository Place 1 suppository (300 mg total) rectally daily. (Patient not taking: Reported on 06/11/2015) 12 suppository 0 Not Taking at Unknown time   Scheduled: . ampicillin-sulbactam  (UNASYN) IV  3 g Intravenous Q8H  . [START ON 06/12/2015] antiseptic oral rinse  7 mL Mouth Rinse q12n4p  . [START ON 06/12/2015] aspirin EC  81 mg Oral Daily  . chlorhexidine  15 mL Mouth Rinse BID  . cloNIDine  0.2 mg Oral BID  . [START ON 06/12/2015] clopidogrel  75 mg Oral Q breakfast  . enoxaparin (LOVENOX) injection  40 mg Subcutaneous Q24H  . [START ON 06/12/2015] furosemide  20 mg Oral Daily  . insulin aspart  0-15 Units Subcutaneous TID WC  . insulin aspart  0-5 Units Subcutaneous QHS  . insulin aspart  4 Units Subcutaneous TID WC  . ipratropium-albuterol  3 mL Nebulization QID  . [START ON 06/12/2015] isosorbide mononitrate  30 mg Oral Daily  . [START ON 06/12/2015] lisinopril  10 mg Oral q morning - 10a  . pravastatin  40 mg Oral QPM  . sodium chloride  3 mL Intravenous Q12H  . sodium chloride  3 mL Intravenous Q12H  . traZODone  50 mg Oral QHS   Continuous:  ZOX:WRUEAV chloride, acetaminophen **OR** acetaminophen, albuterol, food thickener, ondansetron **OR** ondansetron (ZOFRAN) IV, senna-docusate, sodium chloride   ROS:  History obtained from the patient  General ROS: Positive for - chills, fatigue, fever,  Psychological ROS: negative for - behavioral disorder, hallucinations, memory difficulties, mood swings or suicidal ideation Ophthalmic ROS: negative for - blurry vision, double vision, eye pain or loss of vision ENT ROS: negative for - epistaxis, nasal discharge, oral lesions, sore throat, tinnitus or vertigo aspirin and Plavix Allergy and Immunology ROS: negative for - hives or itchy/watery eyes Hematological and Lymphatic ROS: negative for - bleeding problems, bruising or swollen lymph nodes Endocrine ROS: negative for - galactorrhea, hair pattern changes, polydipsia/polyuria or temperature intolerance Respiratory ROS: Positive for -  cough, shortness of breath, wheezing Cardiovascular ROS: negative for - chest pain, dyspnea on exertion, edema or irregular heartbeat Gastrointestinal ROS: negative for - abdominal pain, diarrhea, hematemesis, nausea/vomiting or stool incontinence Genito-Urinary ROS: negative for - dysuria, hematuria, incontinence or urinary frequency/urgency Musculoskeletal ROS: negative for - joint swelling or muscular weakness Neurological ROS: as noted in HPI Dermatological ROS: negative for rash and skin lesion changes   Blood pressure 106/67, pulse 86, temperature 98.6 F (37 C), temperature source Oral, resp. rate 19, height  (1.626 m), weight 71.215 kg (157 lb), SpO2 100 %.   Neurologic Examination:                                                                                                      HEENT-  Normocephalic, no lesions, without obvious abnormality.  Normal external eye and conjunctiva.  Normal external ears.   Neurological Examination Mental Status: Alert, oriented, thought content appropriate. Mild dysarthric speech without evidence of aphasia.  Able to follow 3 step commands without difficulty. Cranial Nerves: II:  Visual fields grossly normal, pupils equal, round, reactive to light and accommodation III,IV, VI: ptosis not present, extra-ocular motions intact bilaterally V,VII: Left facial weakness noted, upper motor neuron from recent stroke, facial light touch sensation normal bilaterally VIII: hearing normal bilaterally IX,X: uvula rises symmetrically XI: bilateral shoulder shrug XII: midline tongue extension Motor: Right : Upper extremity   5/5    Left:     Upper extremity   0/5  Lower extremity   5/5     Lower extremity   0/5 Sensory: Grossly symmetric Pinprick and light touch intact throughout, bilaterally Deep Tendon Reflexes: Brisk, 3+ left   Plantars: Right: downgoing   Left: downgoing Cerebellar: normal finger-to-nose with the right upper extremity Gait:   Deferred    Lab Results: Basic Metabolic Panel:  Recent Labs Lab 06/06/15 0632 06/07/15 0650 06/09/15 0620 06/10/15 0648 06/11/15 0953 06/11/15 1707  NA 139 138 135 136 133*  --   K 3.8 3.5 3.3* 3.9 3.2*  --   CL 103 97* 96* 96* 92*  --   CO2 32 34*  --   GLUCOSE 152* 159* 181* 178* 162*  --   BUN 24* 24* 47* 53* 33*  --   CREATININE 0.58 0.62 0.76 0.84 0.64 0.69  CALCIUM 8.6* 8.8* 8.1* 8.2* 8.4*  --   MG  --   --   --   --   --  2.3    Liver Function Tests: No results for input(s): AST, ALT, ALKPHOS, BILITOT, PROT, ALBUMIN in the last 168 hours. No results for input(s): LIPASE, AMYLASE in the last 168 hours. No results for input(s): AMMONIA in the last 168 hours.  CBC:  Recent Labs Lab 06/06/15 1345 06/09/15 0620 06/11/15 0953 06/11/15 1707  WBC 12.4* 19.5* 28.1* 24.9*  NEUTROABS 11.2* 17.5* 24.3*  --   HGB 15.8* 13.5 12.6 11.2*  HCT 46.4* 39.5 37.5 33.3*  MCV 79.3 78.1 79.6 79.7  PLT 229 239 191 189    Cardiac Enzymes:  Recent Labs Lab 06/11/15 0953 06/11/15 1707  TROPONINI 0.20* 0.17*    Lipid Panel: No results for input(s): CHOL, TRIG, HDL, CHOLHDL, VLDL, LDLCALC in the last 168 hours.  CBG:  Recent Labs Lab 06/09/15 2026 06/10/15 0721 06/10/15 1110 06/11/15 1449 06/11/15 1717  GLUCAP 219* 163* 168* 181* 167*    Microbiology: Results for orders placed or performed during the hospital encounter of 05/31/15  Culture, blood (routine x 2) Call MD if unable to obtain prior to antibiotics being given     Status: None   Collection Time: 05/31/15 10:51 PM  Result Value Ref Range Status   Specimen Description BLOOD LEFT ARM  Final   Special Requests BOTTLES DRAWN AEROBIC AND ANAEROBIC 6CC  Final   Culture NO GROWTH 5 DAYS  Final   Report Status 06/05/2015 FINAL  Final  Culture, blood (routine x 2) Call MD if unable to obtain prior to antibiotics being given     Status: None   Collection Time: 05/31/15 10:55 PM  Result Value Ref  Range Status   Specimen Description RIGHT ANTECUBITAL  Final   Special Requests BOTTLES DRAWN AEROBIC ONLY 6CC  Final   Culture NO GROWTH 5 DAYS  Final   Report Status 06/05/2015 FINAL  Final  Culture, sputum-assessment     Status: None   Collection Time: 06/01/15  2:16 PM  Result Value Ref Range Status   Specimen Description SPU  Final   Special Requests NONE  Final   Sputum evaluation   Final    THIS SPECIMEN IS ACCEPTABLE FOR SPUTUM CULTURE PERFORMED AT [facility]    Report Status 06/01/2015 FINAL  Final  Culture, respiratory (NON-Expectorated)     Status: None   Collection Time: 06/01/15  2:20 PM  Result Value Ref Range Status   Specimen Description SPUTUM  Final   Special Requests NONE  Final   Gram Stain   Final    RARE WBC PRESENT, PREDOMINANTLY PMN FEW SQUAMOUS EPITHELIAL CELLS PRESENT MODERATE GRAM POSITIVE COCCI IN PAIRS IN CHAINS IN CLUSTERS RARE GRAM NEGATIVE RODS Performed at [facility]    Culture   Final    NORMAL OROPHARYNGEAL FLORA Performed at [facility]    Report Status 06/04/2015 FINAL  Final    Coagulation Studies: No results for input(s): LABPROT, INR in the last 72 hours.  Imaging: Dg Chest Portable 1 View  06/11/2015  CLINICAL DATA:  Worsening shortness of breath. History of hypertension. 06/06/1961 EXAM: PORTABLE CHEST 1 VIEW COMPARISON:  None. FINDINGS: Streaky opacity in the right lung base is mildly increased. Other areas of bronchial wall thickening interstitial prominence are stable. Lungs are mildly hyperexpanded. No pleural effusion or pneumothorax. No evidence of pulmonary edema. Cardiac silhouette is normal in size and configuration. No mediastinal or hilar masses or convincing adenopathy. Bony thorax is demineralized but grossly intact. IMPRESSION: Mild increase in opacity at the right lung  base. The apparent difference from the prior exam may be technical due to differences in patient positioning and lung volume. However, given  the worsening symptoms, a developing bronchopneumonia should be considered. No evidence of pulmonary edema. No other change. Electronically Signed   By: Amie Portland M.D.   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 Currently on aspirin and Plavix. She was noted to have a cardiac thrombus and is planned for an anticoagulation. Neurology service consulted to obtain clearance prior to anticoagulation. Her neurological examination is significant for left hemiplegia and mild dysarthria with left facial weakness. I recommend obtaining a repeat MRI of the brain without contrast to evaluate for any acute infarcts. If large acute infarct noted on the brain MRI study, then it can significantly increase the risk of bleeding with anticoagulation. Hence MRI will be helpful to evaluate this risk. If no acute infarcts are noted or  If the infarcts are very small in size, then the bleeding risk will be low and hence anticoagulation can be started at this time. A stat MRI has been ordered .  Please call for any further questions.

## 2015-06-11 NOTE — ED Notes (Signed)
MD at bedside. 

## 2015-06-11 NOTE — Progress Notes (Signed)
ANTIBIOTIC CONSULT NOTE - INITIAL  Pharmacy Consult for Vancomycin / Zosyn to Unasyn Indication: pneumonia  No Known Allergies   Labs:  Recent Labs  06/09/15 0620 06/10/15 0648 06/11/15 0953  WBC 19.5*  --  28.1*  HGB 13.5  --  12.6  PLT 239  --  191  CREATININE 0.76 0.84 0.64   Estimated Creatinine Clearance: 55.7 mL/min (by C-G formula based on Cr of 0.64). No results for input(s): VANCOTROUGH, VANCOPEAK, VANCORANDOM, GENTTROUGH, GENTPEAK, GENTRANDOM, TOBRATROUGH, TOBRAPEAK, TOBRARND, AMIKACINPEAK, AMIKACINTROU, AMIKACIN in the last 72 hours.   Microbiology: Recent Results (from the past 720 hour(s))  Culture, blood (routine x 2) Call MD if unable to obtain prior to antibiotics being given     Status: None   Collection Time: 05/31/15 10:51 PM  Result Value Ref Range Status   Specimen Description BLOOD LEFT ARM  Final   Special Requests BOTTLES DRAWN AEROBIC AND ANAEROBIC 6CC  Final   Culture NO GROWTH 5 DAYS  Final   Report Status 06/05/2015 FINAL  Final  Culture, blood (routine x 2) Call MD if unable to obtain prior to antibiotics being given     Status: None   Collection Time: 05/31/15 10:55 PM  Result Value Ref Range Status   Specimen Description RIGHT ANTECUBITAL  Final   Special Requests BOTTLES DRAWN AEROBIC ONLY 6CC  Final   Culture NO GROWTH 5 DAYS  Final   Report Status 06/05/2015 FINAL  Final  Culture, sputum-assessment     Status: None   Collection Time: 06/01/15  2:16 PM  Result Value Ref Range Status   Specimen Description SPU  Final   Special Requests NONE  Final   Sputum evaluation   Final    THIS SPECIMEN IS ACCEPTABLE FOR SPUTUM CULTURE PERFORMED AT [facility]    Report Status 06/01/2015 FINAL  Final  Culture, respiratory (NON-Expectorated)     Status: None   Collection Time: 06/01/15  2:20 PM  Result Value Ref Range Status   Specimen Description SPUTUM  Final   Special Requests NONE  Final   Gram Stain   Final    RARE WBC PRESENT, PREDOMINANTLY  PMN FEW SQUAMOUS EPITHELIAL CELLS PRESENT MODERATE GRAM POSITIVE COCCI IN PAIRS IN CHAINS IN CLUSTERS RARE GRAM NEGATIVE RODS Performed at [facility]    Culture   Final    NORMAL OROPHARYNGEAL FLORA Performed at [facility]    Report Status 06/04/2015 FINAL  Final    Medical History: Past Medical History  Diagnosis Date  . Diabetes mellitus without complication (HCC)   . Hypertension   . Tobacco abuse   . Use of cane as ambulatory aid   . Leg edema, right     chronic     Assessment: 69 yo presents to the Emergency Department complaining of gradual onset, moderate, constant SOB that worsened last night. Patient reports associated mild cough and subjective fever. Elevated WBC, but patient on steroid taper PTA. Chest xray shows possible bronchopneumonia. Has chronic COPD. Empiric tx with zosyn and vancomycin --> transitioned to Unasyn  Goal of Therapy:  Appropriate Unasyn dosing  Plan:  Unasyn 3 grams iv Q 8 hours Pharmacy to sign off and follow peripherally  Thank you Okey RegalLisa Christan Ciccarelli, PharmD [phone] 06/11/2015,4:41 PM

## 2015-06-11 NOTE — ED Notes (Signed)
Patient given a wet swab to swab her mouth at this time.

## 2015-06-11 NOTE — Progress Notes (Signed)
Foley removed per MD verbal order

## 2015-06-11 NOTE — Progress Notes (Signed)
ANTIBIOTIC CONSULT NOTE - INITIAL  Pharmacy Consult for Vancomycin Indication: pneumonia  No Known Allergies  Patient Measurements: Weight: 140 lb (63.504 kg)  Vital Signs: Temp: 98.4 F (36.9 C) (11/19 0906) Temp Source: Oral (11/19 0906) BP: 100/71 mmHg (11/19 1130) Pulse Rate: 106 (11/19 1045) Intake/Output from previous day:   Intake/Output from this shift:    Labs:  Recent Labs  06/09/15 0620 06/10/15 0648 06/11/15 0953  WBC 19.5*  --  28.1*  HGB 13.5  --  12.6  PLT 239  --  191  CREATININE 0.76 0.84 0.64   Estimated Creatinine Clearance: 55.7 mL/min (by C-G formula based on Cr of 0.64). No results for input(s): VANCOTROUGH, VANCOPEAK, VANCORANDOM, GENTTROUGH, GENTPEAK, GENTRANDOM, TOBRATROUGH, TOBRAPEAK, TOBRARND, AMIKACINPEAK, AMIKACINTROU, AMIKACIN in the last 72 hours.   Microbiology: Recent Results (from the past 720 hour(s))  Culture, blood (routine x 2) Call MD if unable to obtain prior to antibiotics being given     Status: None   Collection Time: 05/31/15 10:51 PM  Result Value Ref Range Status   Specimen Description BLOOD LEFT ARM  Final   Special Requests BOTTLES DRAWN AEROBIC AND ANAEROBIC 6CC  Final   Culture NO GROWTH 5 DAYS  Final   Report Status 06/05/2015 FINAL  Final  Culture, blood (routine x 2) Call MD if unable to obtain prior to antibiotics being given     Status: None   Collection Time: 05/31/15 10:55 PM  Result Value Ref Range Status   Specimen Description RIGHT ANTECUBITAL  Final   Special Requests BOTTLES DRAWN AEROBIC ONLY 6CC  Final   Culture NO GROWTH 5 DAYS  Final   Report Status 06/05/2015 FINAL  Final  Culture, sputum-assessment     Status: None   Collection Time: 06/01/15  2:16 PM  Result Value Ref Range Status   Specimen Description SPU  Final   Special Requests NONE  Final   Sputum evaluation   Final    THIS SPECIMEN IS ACCEPTABLE FOR SPUTUM CULTURE PERFORMED AT [facility]    Report Status 06/01/2015 FINAL  Final  Culture,  respiratory (NON-Expectorated)     Status: None   Collection Time: 06/01/15  2:20 PM  Result Value Ref Range Status   Specimen Description SPUTUM  Final   Special Requests NONE  Final   Gram Stain   Final    RARE WBC PRESENT, PREDOMINANTLY PMN FEW SQUAMOUS EPITHELIAL CELLS PRESENT MODERATE GRAM POSITIVE COCCI IN PAIRS IN CHAINS IN CLUSTERS RARE GRAM NEGATIVE RODS Performed at [facility]    Culture   Final    NORMAL OROPHARYNGEAL FLORA Performed at [facility]    Report Status 06/04/2015 FINAL  Final    Medical History: Past Medical History  Diagnosis Date  . Diabetes mellitus without complication (HCC)   . Hypertension   . Tobacco abuse   . Use of cane as ambulatory aid   . Leg edema, right     chronic    Medications:  See med rec  Assessment: 69 yo presents to the Emergency Department complaining of gradual onset, moderate, constant SOB that worsened last night. Patient reports associated mild cough and subjective fever. Elevated WBC, but patient on steroid taper PTA. Chest xray shows possible bronchopneumonia. Has chronic COPD. Empiric tx with zosyn and vancomycin  Goal of Therapy:  Vancomycin trough level 15-20 mcg/ml  Plan:  Vancomycin 1250mg  loading dose, then 500mg  iv q12h Measure antibiotic drug levels at steady state Follow up culture results Monitor  V/S and labs  Elder Cyphers, BS Pharm D, BCPS Clinical Pharmacist Pager [phone] 06/11/2015,11:37 AM

## 2015-06-12 ENCOUNTER — Inpatient Hospital Stay (HOSPITAL_COMMUNITY): Payer: Medicare Other

## 2015-06-12 DIAGNOSIS — E1159 Type 2 diabetes mellitus with other circulatory complications: Secondary | ICD-10-CM

## 2015-06-12 DIAGNOSIS — I10 Essential (primary) hypertension: Secondary | ICD-10-CM

## 2015-06-12 DIAGNOSIS — J9611 Chronic respiratory failure with hypoxia: Secondary | ICD-10-CM

## 2015-06-12 DIAGNOSIS — I213 ST elevation (STEMI) myocardial infarction of unspecified site: Secondary | ICD-10-CM

## 2015-06-12 DIAGNOSIS — I639 Cerebral infarction, unspecified: Secondary | ICD-10-CM

## 2015-06-12 DIAGNOSIS — R400 Somnolence: Secondary | ICD-10-CM | POA: Diagnosis present

## 2015-06-12 DIAGNOSIS — I5022 Chronic systolic (congestive) heart failure: Secondary | ICD-10-CM

## 2015-06-12 DIAGNOSIS — Z720 Tobacco use: Secondary | ICD-10-CM

## 2015-06-12 DIAGNOSIS — J690 Pneumonitis due to inhalation of food and vomit: Secondary | ICD-10-CM

## 2015-06-12 DIAGNOSIS — I248 Other forms of acute ischemic heart disease: Secondary | ICD-10-CM

## 2015-06-12 DIAGNOSIS — J449 Chronic obstructive pulmonary disease, unspecified: Secondary | ICD-10-CM

## 2015-06-12 DIAGNOSIS — E119 Type 2 diabetes mellitus without complications: Secondary | ICD-10-CM

## 2015-06-12 LAB — COMPREHENSIVE METABOLIC PANEL
ALT: 36 U/L (ref 14–54)
AST: 28 U/L (ref 15–41)
Albumin: 2.6 g/dL — ABNORMAL LOW (ref 3.5–5.0)
Alkaline Phosphatase: 79 U/L (ref 38–126)
Anion gap: 7 (ref 5–15)
BILIRUBIN TOTAL: 0.9 mg/dL (ref 0.3–1.2)
BUN: 27 mg/dL — AB (ref 6–20)
CO2: 35 mmol/L — ABNORMAL HIGH (ref 22–32)
CREATININE: 0.69 mg/dL (ref 0.44–1.00)
Calcium: 8.3 mg/dL — ABNORMAL LOW (ref 8.9–10.3)
Chloride: 96 mmol/L — ABNORMAL LOW (ref 101–111)
Glucose, Bld: 132 mg/dL — ABNORMAL HIGH (ref 65–99)
POTASSIUM: 4.5 mmol/L (ref 3.5–5.1)
SODIUM: 138 mmol/L (ref 135–145)
TOTAL PROTEIN: 5.5 g/dL — AB (ref 6.5–8.1)

## 2015-06-12 LAB — GLUCOSE, CAPILLARY
Glucose-Capillary: 122 mg/dL — ABNORMAL HIGH (ref 65–99)
Glucose-Capillary: 131 mg/dL — ABNORMAL HIGH (ref 65–99)
Glucose-Capillary: 169 mg/dL — ABNORMAL HIGH (ref 65–99)

## 2015-06-12 LAB — CBC
HEMATOCRIT: 33.2 % — AB (ref 36.0–46.0)
Hemoglobin: 10.9 g/dL — ABNORMAL LOW (ref 12.0–15.0)
MCH: 26.1 pg (ref 26.0–34.0)
MCHC: 32.8 g/dL (ref 30.0–36.0)
MCV: 79.6 fL (ref 78.0–100.0)
Platelets: 200 10*3/uL (ref 150–400)
RBC: 4.17 MIL/uL (ref 3.87–5.11)
RDW: 13.6 % (ref 11.5–15.5)
WBC: 24.4 10*3/uL — ABNORMAL HIGH (ref 4.0–10.5)

## 2015-06-12 LAB — TROPONIN I: TROPONIN I: 0.15 ng/mL — AB (ref <0.031)

## 2015-06-12 MED ORDER — ASPIRIN 300 MG RE SUPP: 300.0000 mg | Freq: Every day | RECTAL | Status: DC

## 2015-06-12 MED ORDER — GUAIFENESIN ER 600 MG PO TB12
600.0000 mg | ORAL_TABLET | Freq: Two times a day (BID) | ORAL | Status: DC
  Administered 2015-06-12 – 2015-06-14 (×4): 600 mg via ORAL
  Filled 2015-06-12 (×4): qty 1

## 2015-06-12 MED ORDER — INSULIN ASPART 100 UNIT/ML ~~LOC~~ SOLN: 0.0000 [IU] | Freq: Four times a day (QID) | SUBCUTANEOUS | Status: DC

## 2015-06-12 MED ORDER — CLONIDINE HCL 0.1 MG PO TABS: 0.1000 mg | ORAL_TABLET | Freq: Two times a day (BID) | ORAL | Status: DC

## 2015-06-12 MED ORDER — INSULIN ASPART 100 UNIT/ML ~~LOC~~ SOLN
0.0000 [IU] | Freq: Four times a day (QID) | SUBCUTANEOUS | Status: DC
  Administered 2015-06-12: 3 [IU] via SUBCUTANEOUS
  Administered 2015-06-13: 2 [IU] via SUBCUTANEOUS
  Administered 2015-06-13: 3 [IU] via SUBCUTANEOUS

## 2015-06-12 MED ORDER — TRAZODONE HCL 50 MG PO TABS
50.0000 mg | ORAL_TABLET | Freq: Every evening | ORAL | Status: DC | PRN
  Administered 2015-06-13: 50 mg via ORAL
  Filled 2015-06-12: qty 1

## 2015-06-12 MED ORDER — PERFLUTREN LIPID MICROSPHERE
1.0000 mL | INTRAVENOUS | Status: AC | PRN
  Administered 2015-06-12: 4 mL via INTRAVENOUS
  Filled 2015-06-12: qty 10

## 2015-06-12 MED ORDER — ISOSORBIDE DINITRATE 10 MG PO TABS
10.0000 mg | ORAL_TABLET | Freq: Three times a day (TID) | ORAL | Status: DC
  Administered 2015-06-12 – 2015-06-13 (×2): 10 mg via ORAL
  Filled 2015-06-12 (×3): qty 1

## 2015-06-12 MED ORDER — METHYLPREDNISOLONE SODIUM SUCC 40 MG IJ SOLR
40.0000 mg | Freq: Every day | INTRAMUSCULAR | Status: DC
  Administered 2015-06-12 – 2015-06-13 (×2): 40 mg via INTRAVENOUS
  Filled 2015-06-12 (×2): qty 1

## 2015-06-12 NOTE — Progress Notes (Signed)
  Echocardiogram 2D Echocardiogram has been performed.  Tara Park, Tara Park 06/12/2015, 9:30 AM

## 2015-06-12 NOTE — Progress Notes (Signed)
VASCULAR LAB PRELIMINARY  PRELIMINARY  PRELIMINARY  PRELIMINARY  Bilateral lower extremity venous duplex completed.    Preliminary report:  There is no obvious evidence of DVT or SVT noted in the visualized veins of the bilateral lower extremities.   Paulo Keimig, RVT 06/12/2015, 5:31 PM

## 2015-06-12 NOTE — Clinical Social Work Note (Signed)
Clinical Social Work Assessment  Patient Details  Name: Tara Park MRN: [id number] Date of Birth: 1945/11/27  Date of referral:  06/12/15               Reason for consult:  Facility Placement                Permission sought to share information with:    Permission granted to share information::     Name::        Agency::     Relationship::     Contact Information:     Housing/Transportation Living arrangements for the past 2 months:  Price of Information:  Adult Children Patient Interpreter Needed:  None Criminal Activity/Legal Involvement Pertinent to Current Situation/Hospitalization:  No - Comment as needed Significant Relationships:  Adult Children, Other Family Members Lives with:  Facility Resident Do you feel safe going back to the place where you live?  Yes Need for family participation in patient care:  No (Coment)  Care giving concerns: None identified.  Pt will return to [facility] as bed available.  CSW will confirm with them that they can accept her back on 11/21. Social Worker assessment / plan:  CSW met with pt, her son Tara Park) and DIL (Tara Park) to discuss pt's d/c plan.  Family would like pt to return to [facility] at d/c for continued rehab. (Pt initially admitted there from [facility] on 11/18.)  CSW will follow for progression and readiness to return to SNF and coordinate arrangements. Employment status:  Retired Forensic scientist:  Medicare PT Recommendations:  Tara Park / Referral to community resources:  [place]  Patient/Family's Response to care:  Pt/family agreeable to return to [facility] at d/c. Patient/Family's Understanding of and Emotional Response to Diagnosis, Current Treatment, and Prognosis:  Family realistic in pt's probable need for LTC at this time, although they are hopeful that she may be able to progress to an ALF LOC at some point.  Family has a good understanding of  pt's present clinical state, including her significant functional limitations. Emotional Assessment Appearance:  Appears stated age Attitude/Demeanor/Rapport:  Lethargic Affect (typically observed):  Quiet Orientation:  Oriented to Self, Oriented to Place, Oriented to Situation Alcohol / Substance use:  Not Applicable Psych involvement (Current and /or in the community):  No (Comment)  Discharge Needs  Concerns to be addressed:  Discharge Planning Concerns Readmission within the last 30 days:  Yes Current discharge risk:  None Barriers to Discharge:  No Barriers Identified   Tara Raider, LCSW 06/12/2015, 12:32 PM

## 2015-06-12 NOTE — Progress Notes (Signed)
STROKE TEAM PROGRESS NOTE   HISTORY Tara Park is an 69 y.o. female admitted with pneumonia to the hospitalist service. Her recent transthoracic echocardiogram showed a cardiac thrombus. 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 Neurology service is consulted to obtain clearance to start anticoagulation in setting of recent acute stroke. She is currently on aspirin and Plavix.  No new neurological symptoms in the past few days except for the residual left hemiplegia since the stroke on 06/02/2015. Her stroke workup was done at an outside facility.   SUBJECTIVE (INTERVAL HISTORY) No family members present. Echo tech and nurse in patient's room. The nurse reports that the patient was confused last night and not able to lie still. CXR concerning for aspiration pneumonia. Will cancel MRI and repeat CT. 2D echo repeat did not see thrombus and EF was normal now.   OBJECTIVE Temp:  [97.5 F (36.4 C)-99.3 F (37.4 C)] 99 F (37.2 C) (11/20 0750) Pulse Rate:  [79-118] 84 (11/20 0750) Cardiac Rhythm:  [-] Normal sinus rhythm (11/20 0443) Resp:  [13-27] 17 (11/20 0750) BP: (100-136)/(41-94) 101/41 mmHg (11/20 0750) SpO2:  [76 %-100 %] 98 % (11/20 0750) Weight:  [63.504 kg (140 lb)-71.215 kg (157 lb)] 70.852 kg (156 lb 3.2 oz) (11/20 0500)  CBC:  Recent Labs Lab 06/09/15 0620 06/11/15 0953 06/11/15 1707 06/12/15 0430  WBC 19.5* 28.1* 24.9* 24.4*  NEUTROABS 17.5* 24.3*  --   --   HGB 13.5 12.6 11.2* 10.9*  HCT 39.5 37.5 33.3* 33.2*  MCV 78.1 79.6 79.7 79.6  PLT 239 191 189 200    Basic Metabolic Panel:  Recent Labs Lab 06/11/15 0953 06/11/15 1707 06/12/15 0430  NA 133*  --  138  K 3.2*  --  4.5  CL 92*  --  96*  CO2 34*  --  35*  GLUCOSE 162*  --  132*  BUN 33*  --  27*  CREATININE 0.64 0.69 0.69  CALCIUM 8.4*  --  8.3*  MG  --  2.3  --     Lipid Panel:    Component Value Date/Time   CHOL 155 06/01/2015 0618   TRIG 67  06/01/2015 0618   HDL 29* 06/01/2015 0618   CHOLHDL 5.3 06/01/2015 0618   VLDL 13 06/01/2015 0618   LDLCALC 113* 06/01/2015 0618   HgbA1c:  Lab Results  Component Value Date   HGBA1C 6.0* 06/01/2015   Urine Drug Screen:    Component Value Date/Time   LABOPIA NONE DETECTED 05/31/2015 2020   COCAINSCRNUR NONE DETECTED 05/31/2015 2020   LABBENZ NONE DETECTED 05/31/2015 2020   AMPHETMU NONE DETECTED 05/31/2015 2020   THCU POSITIVE* 05/31/2015 2020   LABBARB NONE DETECTED 05/31/2015 2020      IMAGING  Dg Chest Portable 1 View 06/11/2015   IMPRESSION:  Mild increase in opacity at the right lung base. The apparent difference from the prior exam may be technical due to differences in patient positioning and lung volume. However, given the worsening symptoms, a developing bronchopneumonia should be considered. No evidence of pulmonary edema. No other change.   CT of head without contrast - pending  CTA head and neck 06/02/15   1. Bilateral cervical carotid atherosclerosis greater on the right. 60% stenosis of the right ICA origin. 2. Extensive calcified plaque in both ICA siphons with moderate bilateral supraclinoid segment stenosis. 3. Calcified plaque causes moderate to severe stenosis at the origin of the dominant right vertebral artery.  Non dominant left vertebral artery arises directly from the arch and functionally terminates in PICA. 4. Stable circle of Willis branches from the recent MRA including moderate left PCA stenosis. 5. Stable CT appearance of the brain from earlier today. 6. Pulmonary emphysema.  MRI 05/31/15 - 1. 5 mm acute ischemic nonhemorrhagic infarct within the left centrum semi ovale. No significant mass effect. 2. Remote lacunar infarcts involving the left lentiform nucleus and right thalamus. 3. Age-related cerebral atrophy with moderate chronic microvascular ischemic disease.  MRA 06/01/15 1. No evidence of medium or large vessel intracranial occlusion or  flow-limiting proximal stenosis. 2. Mild left supraclinoid ICA and proximal left ACA stenoses. 3. Anterior and posterior circulation branch vessel irregularity and attenuation suggestive of atherosclerosis.  CUS 06/01/15 - Moderate calcified plaque at the level of both carotid bulbs and proximal internal carotid arteries. Plaque burden slightly more prominent on the right compared to the left by ultrasound. Bilateral ICA stenoses are estimated at less than 50% based on velocity criteria. Based on grayscale imaging, the right ICA stenosis is likely closer to the 50% range.  2D echo 06/02/15 -  - Normal LV wall thickness with LVEF 25-30% and wall motion abnormaltiies as outlined above. Findings are suggestive of an ischemic cardiomyopathy, although distribution of wall motion abnormalities could be consistent with stress-induced (Tako-tsubo) cardiomyopathy as well. There is stranding and trabeculation at the LV apex - no clearly formed mural thrombus, but evidence of relative stasis by microbubble contrast. This could be a potential source of embolic stroke. Aortic valve not well seen, appears sclerotic. Trivial mitral and tricuspid regurgitation. Cannot exclude PFO.  2D echo 06/12/15 - Left ventricle: The cavity size was normal. Systolic function was vigorous. The estimated ejection fraction was in the range of 70% to 75%. Wall motion was normal; there were no regional wall motion abnormalities. Doppler parameters are consistent with abnormal left ventricular relaxation (grade 1 diastolic dysfunction). - Mitral valve: Moderately calcified annulus.  PHYSICAL EXAM  Temp:  [97.5 F (36.4 C)-99.3 F (37.4 C)] 99 F (37.2 C) (11/20 0750) Pulse Rate:  [79-106] 84 (11/20 0750) Resp:  [13-27] 17 (11/20 0750) BP: (100-136)/(41-94) 101/41 mmHg (11/20 0750) SpO2:  [90 %-100 %] 97 % (11/20 0943) Weight:  [156 lb 3.2 oz (70.852 kg)-157 lb (71.215 kg)] 156 lb  3.2 oz (70.852 kg) (11/20 0500)  General - Well nourished, well developed, mild respiratory distress.  Ophthalmologic - Fundi not visualized due to noncooperation.  Cardiovascular - Regular rate and rhythm.  Mental Status -  Level of arousal and orientation to time, place, and person were intact. Language including expression, naming, repetition, comprehension was assessed and found intact. Fund of Knowledge was assessed and was intact.  Cranial Nerves II - XII - II - Visual field intact OU. III, IV, VI - Extraocular movements intact. V - Facial sensation intact bilaterally. VII - left facial droop. VIII - Hearing & vestibular intact bilaterally. X - Palate elevates symmetrically. XI - Chin turning & shoulder shrug intact bilaterally. XII - Tongue protrusion intact.  Motor Strength - The patient's strength was 0/5 LUE and LLE, but 5/5 RUE and RLE.  Bulk was normal and fasciculations were absent.   Motor Tone - Muscle tone was assessed at the neck and appendages and was decreased on the right.  Reflexes - The patient's reflexes were 1+ in all extremities and she had no pathological reflexes.  Sensory - Light touch, temperature/pinprick were assessed and were symmetrical.    Coordination -  The patient had normal movements in the right hand with no ataxia or dysmetria.  Tremor was absent.  Gait and Station - not able to test due to weakness.   ASSESSMENT/PLAN Ms. Clint GuyJane F Caffee is a 69 y.o. female with history of a stroke in 05/31/2015 was treated in [facility], DM, HTN, tobacco use, low EF and questionable LV thrombus on previous echo, presenting with pneumonia and possible aspiration. She did not receive IV t-PA due to no new deficits.  Stroke:  Right brain stroke secondary to unclear source. She does have small left CR infarct but on the same side of neuro deficit, not able to explain the left hemiplegia. Will repeat CT head.  Resultant  Left hemiplegia  MRI  not performed this  admission.  Repeat CT head pending  CTA head and neck - diffuse intracranial stenosis  MRA  06/01/2015 - no high-grade stenosis  Carotid Doppler - 06/01/2015 - bilateral ICA stenosis 50% or less.  2D Echo EF 70-75% and no LV thrombus  LDL - 113  HgbA1c pending  VTE prophylaxis - Lovenox  DIET DYS 2 Room service appropriate?: Yes; Fluid consistency:: Nectar Thick  aspirin 81 mg daily and clopidogrel 75 mg daily prior to admission, now on aspirin 81 mg daily and clopidogrel 75 mg daily.  Patient counseled to be compliant with her antithrombotic medications  Ongoing aggressive stroke risk factor management  Therapy recommendations: Pending  Disposition: Pending  Aspiration pneumonia  CXR concerning for aspiration  On Unasyn and vanco now  Not able to lie flat so that MRI cancelled.  Hypertension  Relatively at low side  D/c lisinopril 10 mg daily and Catapres 0.1 mg.  Due to diffuse intracranial stenosis, BP goal 130-150  Hyperlipidemia  Home meds:  Pravachol 40 mg daily resumed in hospital  LDL 113, goal < 70  Consider change to Lipitor  Continue statin at discharge  Diabetes  HgbA1c 6.0, goal < 7.0  Controlled  SSI  Tobacco abuse  Current smoker  Smoking cessation counseling provided  Pt is willing to quit  Other Stroke Risk Factors  Advanced age  Marijuana use  Other Active Problems  Anemia  Probable dehydration with elevated BUN  Dysphagia  Hospital day # 1  The patient is at risk for respiratory failure due to aspiration pneumonia, risk for recurrent strokes and TIAs and neurological worsening due to labile BP and left hemiplegia and she needs ongoing stroke evaluation and aggressive risk factor modification. She has more distress on breathing and not able to lie flat. On Abx.  Marvel PlanJindong Darcelle Herrada, MD PhD Stroke Neurology 06/12/2015 11:37 AM     To contact Stroke Continuity provider, please refer to [email or web address]. After hours,  contact General Neurology

## 2015-06-12 NOTE — Progress Notes (Signed)
[facility] Progress Note    Patient: RETTA Park     ZOX:[id number]  DOB: 02-Jul-1946     DOA: 06/11/2015 Date of Service: the patient was seen and examined on 06/12/2015 Day 2 of admission.  Subjective: Patient has not been able to sleep last night and during the day has been increasingly more sleepy. Complains of poor breathing. No chest pain abdominal pain and nausea and vomiting. No diarrhea reported. Nutrition: Nothing by mouth as speech therapy was consulted Activity: Bedridden at present due to weakness  Assessment and Plan: 1. Aspiration pneumonia [facility]) The patient is presenting with complaints of shortness of breath. X-ray shows increased opacity at the right base with leukocytosis and hypoxia concerning for pneumonia. Patient was started on Unasyn. Leukocytosis persist but this could also be secondary to ongoing use of steroids. We will follow the cultures. Speech therapy consulted. Currently nothing by mouth except medication and repeat MBS tomorrow  2. Recent CVA. Drowsiness. Neurology consulted and will follow the recommendation. Currently CT of the head is ordered by neurology for follow-up on her recent CVA. Continuing aspirin and Plavix at present.  3. Mural cardiac apex thrombus. Recent echocardiogram after her stroke shows a possible thrombus at the cardiac apex. Cardiology as well as neurology were consulted. Recommended repeat definitive contrast echocardiogram. We will follow the results. Anticoagulation will be depending on recommendation from cardiology as well as neurology.   4. COPD. Vision is bilateral expiratory wheezing. Continuing nebulizers as well as changing the steroids to IV.  5  Diabetes mellitus without complication (HCC) Continuing the patient on sliding scale insulin.  6  Hypertension Blood pressure stable. We'll continue close monitoring.  7  Demand ischemia (HCC) EKG no acute ST-T wave changes suggesting ischemia. Repeat  echocardiogram pending. Cardiology considers this to moderate ischemia most likely due to her aspiration pneumonia. We will continue monitoring on telemetry.  8  Chronic systolic CHF (congestive heart failure) (HCC) Continuing Lasix at present.  DVT Prophylaxis: subcutaneous Heparin Nutrition: Nothing by mouth except medication.  Advance goals of care discussion: Full code  Brief Summary of Hospitalization:  HPI:  As per the H&P by Dr. Ardyth Harps "Patient is a 69 year old woman with multiple medical comorbidities including COPD on 4 L of chronic oxygen, hypertension, diabetes, tobacco abuse who was just discharged from the hospital yesterday after suffering an acute ischemic CVA. While at her skilled nursing facility she states she was very short of breath last night and today and finally asked to be transferred to the emergency department for evaluation. Chest x-ray shows a developing infiltrate in the right lung base, lab workup is indicative of hypokalemia with a potassium of 3.2, WBC count of 28.1, troponin is 0.20. Interestingly, during her workup for stroke a 2-D echo was performed on 06/02/2015 that showed a severely reduced ejection fraction in the range of 25-30%, akinesis of the apical lateral septal, anterior, inferior and apical myocardium. Findings suggestive of an ischemic cardiomyopathy. There is stranding and trabeculation at the LV apex which could potentially be a source of embolic stroke, possibility of thrombus is not excluded. "  Daily update: Patient was initially seen at any Hospital on 06/11/2015 and Transferred to [facility] Same Day. Cardiology and Neurology Were Consulted. 06/12/2015, Speech Therapy Consulted, Currently Nothing by Mouth Consultants: Phone consultation with cardiology, neurology consultation Procedures: Echocardiogram with Definity contrast results pending. MBS. Antibiotics: Unasyn 06/11/2015 start date  Family Communication: family was  present at bedside, opportunity was given to ask  question and all questions were answered satisfactorily at the time of interview.  Disposition:  Expected discharge date: 06/15/2015 Barriers to safe discharge: Respiration, swallowing, echo findings   Intake/Output Summary (Last 24 hours) at 06/12/15 1436 Last data filed at 06/12/15 1300  Gross per 24 hour  Intake    440 ml  Output     85 ml  Net    355 ml   Filed Weights   06/11/15 0906 06/11/15 1705 06/12/15 0500  Weight: 63.504 kg (140 lb) 71.215 kg (157 lb) 70.852 kg (156 lb 3.2 oz)    Objective: Physical Exam: Filed Vitals:   06/12/15 0500 06/12/15 0750 06/12/15 0943 06/12/15 1227  BP: 124/52 101/41    Pulse: 94 84    Temp: 99.3 F (37.4 C) 99 F (37.2 C)    TempSrc: Oral Axillary    Resp: 14 17    Height:      Weight: 70.852 kg (156 lb 3.2 oz)     SpO2: 100% 98% 97% 97%     General: Appear in mild distress, no Rash; Oral Mucosa moist. Cardiovascular: S1 and S2 Present, aortic systolic Murmur, no JVD Respiratory: Bilateral Air entry present and bilateral basal Crackles, expiratory wheezes Abdomen: Bowel Sound present, Soft and no tenderness Extremities: Bilateral Pedal edema, no calf tenderness Neurology: Grossly no focal neuro deficit  Other than decrease motor strength on the left and left facial droop secondary to recent CVA  Data Reviewed: CBC:  Recent Labs Lab 06/06/15 1345 06/09/15 0620 06/11/15 0953 06/11/15 1707 06/12/15 0430  WBC 12.4* 19.5* 28.1* 24.9* 24.4*  NEUTROABS 11.2* 17.5* 24.3*  --   --   HGB 15.8* 13.5 12.6 11.2* 10.9*  HCT 46.4* 39.5 37.5 33.3* 33.2*  MCV 79.3 78.1 79.6 79.7 79.6  PLT 229 239 191 189 200   Basic Metabolic Panel:  Recent Labs Lab 06/07/15 0650 06/09/15 0620 06/10/15 0648 06/11/15 0953 06/11/15 1707 06/12/15 0430  NA 138 135 136 133*  --  138  K 3.5 3.3* 3.9 3.2*  --  4.5  CL 97* 96* 96* 92*  --  96*  CO2 31 30 32 34*  --  35*  GLUCOSE 159* 181* 178* 162*   --  132*  BUN 24* 47* 53* 33*  --  27*  CREATININE 0.62 0.76 0.84 0.64 0.69 0.69  CALCIUM 8.8* 8.1* 8.2* 8.4*  --  8.3*  MG  --   --   --   --  2.3  --    Liver Function Tests:  Recent Labs Lab 06/12/15 0430  AST 28  ALT 36  ALKPHOS 79  BILITOT 0.9  PROT 5.5*  ALBUMIN 2.6*   No results for input(s): LIPASE, AMYLASE in the last 168 hours. No results for input(s): AMMONIA in the last 168 hours.  Cardiac Enzymes:  Recent Labs Lab 06/11/15 0953 06/11/15 1707 06/11/15 2236 06/12/15 0430  TROPONINI 0.20* 0.17* 0.15* 0.15*   BNP (last 3 results)  Recent Labs  06/01/15 0618 06/11/15 0953  BNP 203.0* 193.0*    ProBNP (last 3 results) No results for input(s): PROBNP in the last 8760 hours.   CBG:  Recent Labs Lab 06/11/15 1449 06/11/15 1717 06/11/15 2109 06/12/15 0746 06/12/15 1158  GLUCAP 181* 167* 151* 122* 131*    Recent Results (from the past 240 hour(s))  Urine culture     Status: None (Preliminary result)   Collection Time: 06/11/15  6:19 PM  Result Value Ref Range Status   Specimen  Description URINE, RANDOM  Final   Special Requests Normal  Final   Culture TOO YOUNG TO READ  Final   Report Status PENDING  Incomplete     Studies: No results found.   Scheduled Meds: . ampicillin-sulbactam (UNASYN) IV  3 g Intravenous Q8H  . antiseptic oral rinse  7 mL Mouth Rinse q12n4p  . chlorhexidine  15 mL Mouth Rinse BID  . clopidogrel  75 mg Oral Q breakfast  . enoxaparin (LOVENOX) injection  40 mg Subcutaneous Q24H  . furosemide  20 mg Oral Daily  . insulin aspart  0-15 Units Subcutaneous Q6H  . ipratropium-albuterol  3 mL Nebulization QID  . isosorbide dinitrate  10 mg Oral TID  . methylPREDNISolone (SOLU-MEDROL) injection  40 mg Intravenous Daily  . pravastatin  40 mg Oral QPM  . sodium chloride  3 mL Intravenous Q12H  . sodium chloride  3 mL Intravenous Q12H   Continuous Infusions:   Time spent: 30 minutes  Author: Lynden OxfordPranav Lakeasha Petion, MD [facility] Pager: [phone] 06/12/2015 2:36 PM  If 7PM-7AM, please contact night-coverage at [email or web address], password [email or web address]

## 2015-06-12 NOTE — Progress Notes (Signed)
When reviewing pt telemetry noticed that pt rhythm appeared to changed frequently throughout day. Appears to be SR/Accelerated junction vs aflutter. EKG done and revealed accelerated junction at this time. Dr. Allena KatzPatel notified. No new orders given. Clarified cardiology was only consulted over the phone and would not be seeing the pt at this time. Will continue to monitor. Emelda Brothershristy Devesh Monforte RN

## 2015-06-12 NOTE — Progress Notes (Signed)
Speech Language Pathology Treatment: Dysphagia  Patient Details Name: Tara Park MRN: [id number] DOB: 28-Feb-1946 Today's Date: 06/12/2015 Time: [id number] SLP Time Calculation (min) (ACUTE ONLY): 17 min  Assessment / Plan / Recommendation Clinical Impression  Pt initiated Dys 1 texture, nectar thick liquids following [facility]. Developed SOB, needs TEE in light of cardiac findings [facility] CXR reveals new infiltrate in lung base. Increased work of breathing noted following nectar and puree trials during swallow assessment. Given new onset pna and pt fatigue, she would benefit from repeat MBS prior to po recommendations. Pt allowed crushed meds in applesauce until MBS next date.    HPI HPI: 69 year old woman with PMH: dysphagia, MBS ( 06/08/15) recommended Dys 1, nectar thick liquids, DM, HTN, COPD, tobacco abuse just discharged from the hospital yesterday after suffering an acute ischemic admitted with SOB. Chest x-ray shows a developing infiltrate in the right lung base. [facility]      SLP Plan  MBS     Recommendations  Diet recommendations:  (NPO except crushed meds) Medication Administration: Crushed with puree              Oral Care Recommendations: Oral care BID Follow up Recommendations: Skilled Nursing facility Plan: MBS   Tara Park, Tara Park 06/12/2015, 1:41 PM  Tara Park Tara Park Tara Park [phone]

## 2015-06-12 NOTE — Progress Notes (Signed)
Pt called out and requested breathing treatment. Patient noted to have wheezing on expiration bilat, no resp distress noted at this time. Resp therapy notified for breathing treatment. [facility] Dawid Dupriest [employer]

## 2015-06-12 NOTE — NC FL2 (Signed)
[place] MEDICAID FL2 LEVEL OF CARE SCREENING TOOL     IDENTIFICATION  Patient Name: Tara Park Birthdate: 11/28/1945 Sex: female Admission Date (Current Location): 06/11/2015  [facility] and [place] Number:  ([email or web address])   Facility and Address:  [facility] [place], [address] [place], [place] [address]      Provider Number: [id number]  Attending Physician Name and Address:  Rolly Salter, MD  Relative Name and Phone Number:       Current Level of Care:   Recommended Level of Care:   Prior Approval Number:    Date Approved/Denied:   PASRR Number:    Discharge Plan:      Current Diagnoses: Patient Active Problem List   Diagnosis Date Noted  . Aspiration pneumonia (HCC) 06/11/2015  . Demand ischemia (HCC) 06/11/2015  . Hypokalemia 06/11/2015  . Chronic systolic CHF (congestive heart failure) (HCC) 06/11/2015  . 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 COPD (chronic obstructive pulmonary disease) (HCC) 06/11/2015  . Chronic hypoxemic respiratory failure (HCC) 06/11/2015  . Acute ischemic stroke (HCC) 05/31/2015  . COPD exacerbation (HCC) 05/31/2015  . Tobacco abuse   . Diabetes mellitus without complication (HCC)   . Hypertension   . BURSITIS, HIP 09/09/2007  . TRIGGER FINGER 09/09/2007  . FRACTURE, FEMUR, INTERTROCHANTERIC REGION 09/09/2007  . DIABETES 08/01/2007    Orientation ACTIVITIES/SOCIAL BLADDER RESPIRATION              BEHAVIORAL SYMPTOMS/MOOD NEUROLOGICAL BOWEL NUTRITION STATUS           PHYSICIAN VISITS COMMUNICATION OF NEEDS Height & Weight Skin                     AMBULATORY STATUS RESPIRATION             Personal Care Assistance Level of Assistance               Functional Limitations Info                SPECIAL CARE FACTORS FREQUENCY                      Additional Factors Info                  Current Medications (06/12/2015): Current Facility-Administered  Medications  Medication Dose Route Frequency Provider Last Rate Last Dose  . 0.9 %  sodium chloride infusion  250 mL Intravenous PRN Henderson Cloud, MD      . acetaminophen (TYLENOL) tablet 650 mg  650 mg Oral Q6H PRN Henderson Cloud, MD       Or  . acetaminophen (TYLENOL) suppository 650 mg  650 mg Rectal Q6H PRN Henderson Cloud, MD      . albuterol (PROVENTIL) (2.5 MG/3ML) 0.083% nebulizer solution 2.5 mg  2.5 mg Nebulization Q6H PRN Henderson Cloud, MD   2.5 mg at 06/12/15 0355  . Ampicillin-Sulbactam (UNASYN) 3 g in sodium chloride 0.9 % 100 mL IVPB  3 g Intravenous Q8H Saima Rizwan, MD   3 g at 06/12/15 1253  . antiseptic oral rinse (CPC / CETYLPYRIDINIUM CHLORIDE 0.05%) solution 7 mL  7 mL Mouth Rinse q12n4p Saima Rizwan, MD   7 mL at 06/12/15 1254  . chlorhexidine (PERIDEX) 0.12 % solution 15 mL  15 mL Mouth Rinse BID Calvert Cantor, MD   15 mL at 06/12/15 1340  . clopidogrel (PLAVIX) tablet  75 mg  75 mg Oral Q breakfast Henderson Cloud, MD   75 mg at 06/12/15 1342  . enoxaparin (LOVENOX) injection 40 mg  40 mg Subcutaneous Q24H Henderson Cloud, MD   40 mg at 06/11/15 1749  . food thickener (THICK IT) powder   Oral PRN Calvert Cantor, MD      . furosemide (LASIX) tablet 20 mg  20 mg Oral Daily Estela Isaiah Blakes, MD   20 mg at 06/12/15 1341  . insulin aspart (novoLOG) injection 0-15 Units  0-15 Units Subcutaneous Q6H Rolly Salter, MD   0 Units at 06/12/15 1243  . ipratropium-albuterol (DUONEB) 0.5-2.5 (3) MG/3ML nebulizer solution 3 mL  3 mL Nebulization QID Calvert Cantor, MD   3 mL at 06/12/15 1227  . isosorbide dinitrate (ISORDIL) tablet 10 mg  10 mg Oral TID Rolly Salter, MD      . methylPREDNISolone sodium succinate (SOLU-MEDROL) 40 mg/mL injection 40 mg  40 mg Intravenous Daily Rolly Salter, MD   40 mg at 06/12/15 1253  . ondansetron (ZOFRAN) tablet 4 mg  4 mg Oral Q6H PRN Henderson Cloud, MD       Or  .  ondansetron [facility]) injection 4 mg  4 mg Intravenous Q6H PRN Henderson Cloud, MD      . pravastatin (PRAVACHOL) tablet 40 mg  40 mg Oral QPM Henderson Cloud, MD   40 mg at 06/12/15 1341  . senna-docusate (Senokot-S) tablet 1 tablet  1 tablet Oral QHS PRN Henderson Cloud, MD      . sodium chloride 0.9 % injection 3 mL  3 mL Intravenous Q12H Estela Isaiah Blakes, MD   3 mL at 06/11/15 2200  . sodium chloride 0.9 % injection 3 mL  3 mL Intravenous Q12H Henderson Cloud, MD   0 mL at 06/11/15 2200  . sodium chloride 0.9 % injection 3 mL  3 mL Intravenous PRN Henderson Cloud, MD      . traZODone (DESYREL) tablet 50 mg  50 mg Oral QHS Henderson Cloud, MD   50 mg at 06/11/15 2201   Do not use this list as official medication orders. Please verify with discharge summary.  Discharge Medications:   Medication List    ASK your doctor about these medications        albuterol (2.5 MG/3ML) 0.083% nebulizer solution  Commonly known as:  PROVENTIL  Take 2.5 mg by nebulization every 6 (six) hours as needed for wheezing or shortness of breath.     aspirin EC 81 MG tablet  Take 81 mg by mouth daily.     aspirin 300 MG suppository  Place 1 suppository (300 mg total) rectally daily.     cloNIDine 0.2 MG tablet  Commonly known as:  CATAPRES  Take 1 tablet (0.2 mg total) by mouth 2 (two) times daily.     clopidogrel 75 MG tablet  Commonly known as:  PLAVIX  Take 1 tablet (75 mg total) by mouth daily with breakfast.     COMBIVENT RESPIMAT 20-100 MCG/ACT Aers respimat  Generic drug:  Ipratropium-Albuterol  Inhale 2 puffs into the lungs 4 (four) times daily.     digoxin 0.125 MG tablet  Commonly known as:  LANOXIN  Take 1 tablet (0.125 mg total) by mouth daily.     furosemide 20 MG tablet  Commonly known as:  LASIX  Take 1 tablet (  20 mg total) by mouth daily.     HYDROcodone-acetaminophen 5-325 MG tablet  Commonly known as:  NORCO   Take 1 tablet by mouth every 6 (six) hours as needed for moderate pain.     isosorbide mononitrate 30 MG 24 hr tablet  Commonly known as:  IMDUR  Take 1 tablet (30 mg total) by mouth daily.     lisinopril 10 MG tablet  Commonly known as:  PRINIVIL,ZESTRIL  Take 10 mg by mouth every morning.     metFORMIN 500 MG tablet  Commonly known as:  GLUCOPHAGE  Take 500 mg by mouth 2 (two) times daily with a meal.     pravastatin 40 MG tablet  Commonly known as:  PRAVACHOL  Take 40 mg by mouth every evening.     predniSONE 50 MG tablet  Commonly known as:  DELTASONE  Take 1 tablet (50 mg total) by mouth daily with breakfast.     traZODone 50 MG tablet  Commonly known as:  DESYREL  Take 50 mg by mouth at bedtime.        Relevant Imaging Results:  Relevant Lab Results:  Recent Labs    Additional Information    Destini Cambre M, LCSW

## 2015-06-13 ENCOUNTER — Inpatient Hospital Stay (HOSPITAL_COMMUNITY): Payer: Medicare Other

## 2015-06-13 DIAGNOSIS — N390 Urinary tract infection, site not specified: Secondary | ICD-10-CM

## 2015-06-13 LAB — GLUCOSE, CAPILLARY
GLUCOSE-CAPILLARY: 110 mg/dL — AB (ref 65–99)
GLUCOSE-CAPILLARY: 124 mg/dL — AB (ref 65–99)
GLUCOSE-CAPILLARY: 135 mg/dL — AB (ref 65–99)
Glucose-Capillary: 163 mg/dL — ABNORMAL HIGH (ref 65–99)

## 2015-06-13 LAB — CBC WITH DIFFERENTIAL/PLATELET
BASOS ABS: 0 10*3/uL (ref 0.0–0.1)
BASOS PCT: 0 %
Eosinophils Absolute: 0 10*3/uL (ref 0.0–0.7)
Eosinophils Relative: 0 %
HEMATOCRIT: 29.3 % — AB (ref 36.0–46.0)
HEMOGLOBIN: 9.6 g/dL — AB (ref 12.0–15.0)
Lymphocytes Relative: 11 %
Lymphs Abs: 2.4 10*3/uL (ref 0.7–4.0)
MCH: 26.2 pg (ref 26.0–34.0)
MCHC: 32.8 g/dL (ref 30.0–36.0)
MCV: 79.8 fL (ref 78.0–100.0)
MONO ABS: 0.5 10*3/uL (ref 0.1–1.0)
Monocytes Relative: 2 %
NEUTROS ABS: 19.4 10*3/uL — AB (ref 1.7–7.7)
NEUTROS PCT: 87 %
Platelets: 194 10*3/uL (ref 150–400)
RBC: 3.67 MIL/uL — ABNORMAL LOW (ref 3.87–5.11)
RDW: 13.5 % (ref 11.5–15.5)
WBC: 22.2 10*3/uL — AB (ref 4.0–10.5)

## 2015-06-13 LAB — COMPREHENSIVE METABOLIC PANEL
ALBUMIN: 2.4 g/dL — AB (ref 3.5–5.0)
ALT: 31 U/L (ref 14–54)
ANION GAP: 10 (ref 5–15)
AST: 25 U/L (ref 15–41)
Alkaline Phosphatase: 72 U/L (ref 38–126)
BILIRUBIN TOTAL: 1.1 mg/dL (ref 0.3–1.2)
BUN: 21 mg/dL — ABNORMAL HIGH (ref 6–20)
CALCIUM: 8.1 mg/dL — AB (ref 8.9–10.3)
CO2: 28 mmol/L (ref 22–32)
CREATININE: 0.55 mg/dL (ref 0.44–1.00)
Chloride: 101 mmol/L (ref 101–111)
Glucose, Bld: 110 mg/dL — ABNORMAL HIGH (ref 65–99)
Potassium: 4.1 mmol/L (ref 3.5–5.1)
Sodium: 139 mmol/L (ref 135–145)
TOTAL PROTEIN: 5.2 g/dL — AB (ref 6.5–8.1)

## 2015-06-13 LAB — HEMOGLOBIN A1C
HEMOGLOBIN A1C: 6.3 % — AB (ref 4.8–5.6)
MEAN PLASMA GLUCOSE: 134 mg/dL

## 2015-06-13 LAB — MAGNESIUM: MAGNESIUM: 2.2 mg/dL (ref 1.7–2.4)

## 2015-06-13 MED ORDER — PREDNISONE 20 MG PO TABS
40.0000 mg | ORAL_TABLET | Freq: Every day | ORAL | Status: DC
  Administered 2015-06-14: 40 mg via ORAL
  Filled 2015-06-13: qty 2

## 2015-06-13 MED ORDER — INSULIN ASPART 100 UNIT/ML ~~LOC~~ SOLN: 0.0000 [IU] | Freq: Every day | SUBCUTANEOUS | Status: DC

## 2015-06-13 MED ORDER — ASPIRIN 81 MG PO CHEW
81.0000 mg | CHEWABLE_TABLET | Freq: Every day | ORAL | Status: DC
  Administered 2015-06-13 – 2015-06-14 (×2): 81 mg via ORAL
  Filled 2015-06-13 (×2): qty 1

## 2015-06-13 MED ORDER — PRAVASTATIN SODIUM 40 MG PO TABS
80.0000 mg | ORAL_TABLET | Freq: Every evening | ORAL | Status: DC
  Administered 2015-06-13: 80 mg via ORAL
  Filled 2015-06-13: qty 2

## 2015-06-13 MED ORDER — INSULIN ASPART 100 UNIT/ML ~~LOC~~ SOLN
0.0000 [IU] | Freq: Three times a day (TID) | SUBCUTANEOUS | Status: DC
  Administered 2015-06-14: 2 [IU] via SUBCUTANEOUS

## 2015-06-13 NOTE — Consult Note (Addendum)
WOC wound consult note Reason for Consult: Consult requested for right outer posterior foot.  Pt states this is a chronic wound from where she had a previous calcaneal fracture, and her physician has told her to keep a dry dressing over the site.   Wound type: Full thickness Measurement: .2X.2X.2cm Wound bed: red and moist Drainage (amount, consistency, odor) Scant amt tan drainage, no odor Periwound: Intact skin surrounding. Dressing procedure/placement/frequency: Foam dressing to protect from further injury. Please re-consult if further assistance is needed.  Thank-you,  Cammie Mcgeeawn Shayma Pfefferle MSN, RN, CWOCN, [place]CN-AP, CNS [phone]

## 2015-06-13 NOTE — Progress Notes (Signed)
UR Completed Jerzey Komperda Graves-Bigelow, RN,BSN [phone]  

## 2015-06-13 NOTE — Progress Notes (Signed)
[facility] Progress Note    Patient: Tara Park     [name]  DOB: November 12, 1945     DOA: 06/11/2015 Date of Service: the patient was seen and examined on 06/13/2015 Day 2 of admission.  Subjective: Continues to have trouble sleeping. Breathing is about the same. No chest pain or abdominal pain. Appears more awake. Nutrition: Nothing by mouth as speech therapy was consulted Activity: Bedridden at present due to weakness  Assessment and Plan: 1. Aspiration pneumonia [facility]) The patient is presenting with complaints of shortness of breath. X-ray shows increased opacity at the right base with leukocytosis and hypoxia concerning for pneumonia. Patient was started on Unasyn. Leukocytosis currently improving, ongoing use of steroids. We will follow the cultures. Speech therapy consulted. Recommend dysphagia type I diet.  2. Recent CVA. Drowsiness. Neurology consulted and will follow the recommendation. Repeat CT scan shows right-sided basal ganglia infarct. Telemetry overnight shows accelerated junctional rhythm, current telemetry shows sinus tachycardia. Discuss with cardiology as well as neurology and the patient will get a loop monitor as an outpatient. Patient will be reevaluated as an outpatient with a possible TEE. Continuing aspirin and Plavix at present.  3. Abnormal echocardiogram Echocardiogram did not show any evidence of mural thrombosis. Also there is significant improvement in patient's ejection fraction. Continue close monitoring.   4. COPD. Wheezing is improving. Continuing nebulizers as well as changing the steroids to oral.  5  Diabetes mellitus without complication (HCC) Continuing the patient on sliding scale insulin.  6  Hypertension Blood pressure stable. We'll continue close monitoring. Initially I discontinued her clonidine. Neurology recommends permissive hypertension due to patient's progressive stroke and intracranial  stenosis. Discontinued on blood pressure medication.  7  Demand ischemia (HCC) EKG no acute ST-T wave changes suggesting ischemia. Repeat echocardiogram pending. Cardiology considers this demand ischemia most likely due to her aspiration pneumonia. We will continue monitoring on telemetry.  8  Chronic systolic CHF (congestive heart failure) (HCC) Continuing Lasix at present.  DVT Prophylaxis: subcutaneous Heparin Nutrition: Nothing by mouth except medication.  Advance goals of care discussion: Full code  Brief Summary of Hospitalization:  HPI:  As per the H&P by Dr. Ardyth Harps "Patient is a 69 year old woman with multiple medical comorbidities including COPD on 4 L of chronic oxygen, hypertension, diabetes, tobacco abuse who was just discharged from the hospital yesterday after suffering an acute ischemic CVA. While at her skilled nursing facility she states she was very short of breath last night and today and finally asked to be transferred to the emergency department for evaluation. Chest x-ray shows a developing infiltrate in the right lung base, lab workup is indicative of hypokalemia with a potassium of 3.2, WBC count of 28.1, troponin is 0.20. Interestingly, during her workup for stroke a 2-D echo was performed on 06/02/2015 that showed a severely reduced ejection fraction in the range of 25-30%, akinesis of the apical lateral septal, anterior, inferior and apical myocardium. Findings suggestive of an ischemic cardiomyopathy. There is stranding and trabeculation at the LV apex which could potentially be a source of embolic stroke, possibility of thrombus is not excluded. "  Daily update: Patient was initially seen at any Hospital on 06/11/2015 and Transferred to [facility]. Cardiology and Neurology Were Consulted. 06/12/2015, Speech Therapy Consulted, Currently Nothing by Mouth 06/13/2015 diet advance her dysphagia type I.  Consultants: Phone consultation with  cardiology, neurology consultation Procedures: Echocardiogram with Definity contrast results pending. MBS. Antibiotics: Unasyn 06/11/2015 start date  Disposition:  Expected discharge date: 06/15/2015 Barriers to safe discharge: Respiration, swallowing, echo findings   Intake/Output Summary (Last 24 hours) at 06/13/15 1632 Last data filed at 06/13/15 1300  Gross per 24 hour  Intake    340 ml  Output      0 ml  Net    340 ml   Filed Weights   06/11/15 1705 06/12/15 0500 06/13/15 0517  Weight: 71.215 kg (157 lb) 70.852 kg (156 lb 3.2 oz) 70.171 kg (154 lb 11.2 oz)    Objective: Physical Exam: Filed Vitals:   06/12/15 2138 06/13/15 0517 06/13/15 0637 06/13/15 1420  BP: 104/33 134/59  158/60  Pulse: 70 75 92 102  Temp: 98.8 F (37.1 C) 98.7 F (37.1 C)  98.1 F (36.7 C)  TempSrc: Oral Oral  Oral  Resp:   18 28  Height:      Weight:  70.171 kg (154 lb 11.2 oz)    SpO2: 95% 93% 94% 97%     General: Appear in mild distress, no Rash; Oral Mucosa moist. Cardiovascular: S1 and S2 Present, aortic systolic Murmur, no JVD Respiratory: Bilateral Air entry present and bilateral basal Crackles, expiratory wheezes significantly improved Abdomen: Bowel Sound present, Soft and no tenderness Extremities: Bilateral Pedal edema, no calf tenderness Neurology: Grossly no focal neuro deficit  Other than decrease motor strength on the left and left facial droop secondary to recent CVA  Data Reviewed: CBC:  Recent Labs Lab 06/09/15 0620 06/11/15 0953 06/11/15 1707 06/12/15 0430 06/13/15 0319  WBC 19.5* 28.1* 24.9* 24.4* 22.2*  NEUTROABS 17.5* 24.3*  --   --  19.4*  HGB 13.5 12.6 11.2* 10.9* 9.6*  HCT 39.5 37.5 33.3* 33.2* 29.3*  MCV 78.1 79.6 79.7 79.6 79.8  PLT 239 191 189 200 194   Basic Metabolic Panel:  Recent Labs Lab 06/09/15 0620 06/10/15 0648 06/11/15 0953 06/11/15 1707 06/12/15 0430 06/13/15 0319  NA 135 136 133*  --  138 139  K 3.3* 3.9 3.2*  --  4.5 4.1  CL  96* 96* 92*  --  96* 101  CO2 30 32 34*  --  35* 28  GLUCOSE 181* 178* 162*  --  132* 110*  BUN 47* 53* 33*  --  27* 21*  CREATININE 0.76 0.84 0.64 0.69 0.69 0.55  CALCIUM 8.1* 8.2* 8.4*  --  8.3* 8.1*  MG  --   --   --  2.3  --  2.2   Liver Function Tests:  Recent Labs Lab 06/12/15 0430 06/13/15 0319  AST 28 25  ALT 36 31  ALKPHOS 79 72  BILITOT 0.9 1.1  PROT 5.5* 5.2*  ALBUMIN 2.6* 2.4*   No results for input(s): LIPASE, AMYLASE in the last 168 hours. No results for input(s): AMMONIA in the last 168 hours.  Cardiac Enzymes:  Recent Labs Lab 06/11/15 0953 06/11/15 1707 06/11/15 2236 06/12/15 0430  TROPONINI 0.20* 0.17* 0.15* 0.15*   BNP (last 3 results)  Recent Labs  06/01/15 0618 06/11/15 0953  BNP 203.0* 193.0*    ProBNP (last 3 results) No results for input(s): PROBNP in the last 8760 hours.   CBG:  Recent Labs Lab 06/12/15 1158 06/12/15 1742 06/13/15 0045 06/13/15 0520 06/13/15 1130  GLUCAP 131* 169* 110* 124* 163*    Recent Results (from the past 240 hour(s))  Urine culture     Status: None (Preliminary result)   Collection Time: 06/11/15  6:19 PM  Result Value Ref Range Status   Specimen Description  URINE, RANDOM  Final   Special Requests Normal  Final   Culture >=100,000 COLONIES/mL ESCHERICHIA COLI  Final   Report Status PENDING  Incomplete     Studies: Dg Chest Port 1 View  06/13/2015  CLINICAL DATA:  Shortness of breath EXAM: PORTABLE CHEST 1 VIEW COMPARISON:  06/11/2015 FINDINGS: Heart size is normal. No pleural effusion identified. Chronic appearing coarsened interstitial markings are again noted bilaterally. Aeration to the right base has improved from previous exam. There is a new left base opacity which is indeterminate. IMPRESSION: 1. Improved aeration to the right lung base with new left base opacity. Electronically Signed   By: Signa Kell M.D.   On: 06/13/2015 11:55   Dg Swallowing Func-speech Pathology  06/13/2015   Objective Swallowing Evaluation:   Patient Details Name: AIJAH LATTNER MRN: [id number] Date of Birth: 04/29/1946 Today's Date: 06/13/2015 Time: SLP Start Time (ACUTE ONLY): 1045-SLP Stop Time (ACUTE ONLY): 1110 SLP Time Calculation (min) (ACUTE ONLY): 25 min Past Medical History: Past Medical History Diagnosis Date . Diabetes mellitus without complication (HCC)  . Hypertension  . Tobacco abuse  . Use of cane as ambulatory aid  . Leg edema, right    chronic Past Surgical History: Past Surgical History Procedure Laterality Date . Cataract extraction   . Hip surgery   . Foot surgery   HPI: 69 year old woman with PMH: dysphagia, MBS ( 06/08/15) recommended Dys 1, nectar thick liquids, DM, HTN, COPD, tobacco abuse just discharged from the hospital yesterday after suffering an acute ischemic admitted with SOB. Chest x-ray shows a developing infiltrate in the right lung base. No pna prior to [facility] 11/16. 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 Pt's swallow is characterized by generalized weakness, reduced movement and sensation which results in delayed swallow initiation and decreased anterior laryngeal elevation causing reduced airway protection. Pt had unsensed aspiration of thin and unsensed frank penetration of nectar, unimproved by weak cough/ throat clear. Pt had no penetration/aspiration events of honey and puree. Due to reduced functional reserve (COPD, sob, and aspiration pneumonia) SLP recommends conservative diet of honey thick liquids, puree, and pills whole in puree. Pt's swallow function will likely improve as overall health improves, however some residual dysphagia will likely persist due to neurogenic nature of dysphagia  complicated by decreased respiratory status. SLP will f/u with check for diet tolerance and trials of upgrade textures when appropriate.   CHL IP TREATMENT RECOMMENDATION 06/13/2015 Treatment Recommendations Therapy as outlined in treatment plan below   CHL IP DIET RECOMMENDATION 06/13/2015 SLP Diet Recommendations Dysphagia 1 (Puree) solids;Honey thick liquids Liquid Administration via Cup Medication Administration Whole meds with puree Compensations Slow rate;Small sips/bites;Multiple dry swallows after each bite/sip;Minimize environmental distractions;Follow solids with liquid Postural Changes Seated upright at 90 degrees   CHL IP OTHER RECOMMENDATIONS 06/13/2015 Recommended Consults -- Oral Care Recommendations Oral care BID Other Recommendations Order thickener from pharmacy   CHL IP FOLLOW UP RECOMMENDATIONS 06/13/2015 Follow up Recommendations Skilled Nursing facility   [facility] IP FREQUENCY AND DURATION 06/13/2015 Speech Therapy Frequency (ACUTE ONLY) min 2x/week Treatment Duration 2 weeks      CHL IP ORAL PHASE 06/13/2015 Oral Phase Impaired Oral - Pudding Teaspoon -- Oral - Pudding Cup -- Oral - Honey Teaspoon NT Oral - Honey Cup Piecemeal swallowing Oral - Nectar Teaspoon NT  Oral - Nectar Cup Left anterior bolus loss;Lingual/palatal residue;Piecemeal swallowing Oral - Nectar Straw NT Oral - Thin Teaspoon NT Oral - Thin Cup Left anterior bolus loss;Lingual/palatal residue;Piecemeal swallowing Oral - Thin Straw NT Oral - Puree Lingual/palatal residue;Piecemeal swallowing;Decreased bolus cohesion Oral - Mech Soft Impaired mastication;Other (Comment);Piecemeal swallowing;Decreased bolus cohesion Oral - Regular -- Oral - Multi-Consistency -- Oral - Pill WFL Oral Phase - Comment --  CHL IP PHARYNGEAL PHASE 06/13/2015 Pharyngeal Phase Thin;Nectar;Honey;Solids Pharyngeal- Pudding Teaspoon -- Pharyngeal -- Pharyngeal- Pudding Cup -- Pharyngeal -- Pharyngeal- Honey Teaspoon NT Pharyngeal -- Pharyngeal- Honey Cup Delayed  swallow initiation-vallecula;Reduced tongue base retraction Pharyngeal -- Pharyngeal- Nectar Teaspoon NT Pharyngeal -- Pharyngeal- Nectar Cup Penetration/Aspiration before swallow;Penetration/Aspiration during swallow;Trace aspiration;Delayed swallow initiation-pyriform sinuses;Reduced anterior laryngeal mobility;Reduced laryngeal elevation;Reduced airway/laryngeal closure;Reduced tongue base retraction Pharyngeal Material enters airway, CONTACTS cords and not ejected out Pharyngeal- Nectar Straw NT Pharyngeal -- Pharyngeal- Thin Teaspoon NT Pharyngeal -- Pharyngeal- Thin Cup Delayed swallow initiation-pyriform sinuses;Reduced anterior laryngeal mobility;Reduced laryngeal elevation;Reduced epiglottic inversion;Reduced airway/laryngeal closure;Penetration/Aspiration before swallow;Penetration/Aspiration during swallow;Moderate aspiration Pharyngeal Material enters airway, passes BELOW cords without attempt by patient to eject out (silent aspiration) Pharyngeal- Thin Straw NT Pharyngeal -- Pharyngeal- Puree Delayed swallow initiation-vallecula;Reduced laryngeal elevation;Reduced anterior laryngeal mobility Pharyngeal -- Pharyngeal- Mechanical Soft Delayed swallow initiation-vallecula;Reduced laryngeal elevation;Reduced anterior laryngeal mobility Pharyngeal -- Pharyngeal- Regular NT Pharyngeal -- Pharyngeal- Multi-consistency NT Pharyngeal -- Pharyngeal- Pill Delayed swallow initiation-vallecula Pharyngeal -- Pharyngeal Comment --  CHL IP CERVICAL ESOPHAGEAL PHASE 06/13/2015 Cervical Esophageal Phase WFL Pudding Teaspoon -- Pudding Cup -- Honey Teaspoon -- Honey Cup -- Nectar Teaspoon -- Nectar Cup -- Nectar Straw -- Thin Teaspoon -- Thin Cup -- Thin Straw -- Puree -- Mechanical Soft -- Regular -- Multi-consistency -- Pill -- Cervical Esophageal Comment -- Completed by Riccardo DubinKristen Martin, SLP Student Harlon DittyBonnie DeBlois, MA CCC-SLP [phone] DeBlois, Riley NearingBonnie Caroline 06/13/2015, 1:56 PM                Scheduled Meds: .  ampicillin-sulbactam (UNASYN) IV  3 g Intravenous Q8H  . antiseptic oral rinse  7 mL Mouth Rinse q12n4p  . aspirin  81 mg Oral Daily  . chlorhexidine  15 mL Mouth Rinse BID  . clopidogrel  75 mg Oral Q breakfast  . enoxaparin (LOVENOX) injection  40 mg Subcutaneous Q24H  . furosemide  20 mg Oral Daily  . guaiFENesin  600 mg Oral BID  . insulin aspart  0-15 Units Subcutaneous Q6H  . ipratropium-albuterol  3 mL Nebulization QID  . pravastatin  80 mg Oral QPM  . [START ON 06/14/2015] predniSONE  40 mg Oral Q breakfast  . sodium chloride  3 mL Intravenous Q12H  . sodium chloride  3 mL Intravenous Q12H   Continuous Infusions:   Time spent: 35 minutes  Author: Lynden OxfordPranav Karol Liendo, MD [facility] Pager: [phone] 06/13/2015 4:32 PM  If 7PM-7AM, please contact night-coverage at [email or web address], password [email or web address]

## 2015-06-13 NOTE — Care Management Note (Addendum)
Case Management Note  Patient Details  Name: Tara GuyJane F Park MRN: [id number] Date of Birth: November 18, 1945  Subjective/Objective: Pt admitted for SOB. [facility]                     Action/Plan:CSW aware and will speak with pt in regards to disposition needs. No needs from CM at this time.    Expected Discharge Date:                  Expected Discharge Plan:  Skilled Nursing Facility  In-House Referral:  Clinical Social Work  Discharge planning Services  CM Consult  Post Acute Care Choice:  NA Choice offered to:  NA  DME Arranged:  N/A DME Agency:  NA  HH Arranged:  NA HH Agency:  NA  Status of Service:  Completed, signed off  Medicare Important Message Given:    Date Medicare IM Given:    Medicare IM give by:    Date Additional Medicare IM Given:    Additional Medicare Important Message give by:     If discussed at Long Length of Stay Meetings, dates discussed:    Additional Comments:  Gala LewandowskyGraves-Bigelow, Arul Farabee Kaye, RN 06/13/2015, 2:04 PM

## 2015-06-13 NOTE — Progress Notes (Signed)
STROKE TEAM PROGRESS NOTE   SUBJECTIVE (INTERVAL HISTORY) No family members present. RN reports improved respiratory status. For MBS later today, did ok with swallowing meds in applesauce this am. Still not able to lie flat at this time and left hemiplegia. TTE yesterday was normal. Overnight had junctional rhythm.    OBJECTIVE Temp:  [98 F (36.7 C)-98.8 F (37.1 C)] 98.7 F (37.1 C) (11/21 0517) Pulse Rate:  [70-109] 92 (11/21 0637) Cardiac Rhythm:  [-] Sinus tachycardia (11/20 1900) Resp:  [18-20] 18 (11/21 0637) BP: (104-139)/(33-59) 134/59 mmHg (11/21 0517) SpO2:  [93 %-97 %] 94 % (11/21 0637) Weight:  [70.171 kg (154 lb 11.2 oz)] 70.171 kg (154 lb 11.2 oz) (11/21 0517)  CBC:  Recent Labs Lab 06/11/15 0953  06/12/15 0430 06/13/15 0319  WBC 28.1*  < > 24.4* 22.2*  NEUTROABS 24.3*  --   --  19.4*  HGB 12.6  < > 10.9* 9.6*  HCT 37.5  < > 33.2* 29.3*  MCV 79.6  < > 79.6 79.8  PLT 191  < > 200 194  < > = values in this interval not displayed.  Basic Metabolic Panel:   Recent Labs Lab 06/11/15 1707 06/12/15 0430 06/13/15 0319  NA  --  138 139  K  --  4.5 4.1  CL  --  96* 101  CO2  --  35* 28  GLUCOSE  --  132* 110*  BUN  --  27* 21*  CREATININE 0.69 0.69 0.55  CALCIUM  --  8.3* 8.1*  MG 2.3  --  2.2    Lipid Panel:     Component Value Date/Time   CHOL 155 06/01/2015 0618   TRIG 67 06/01/2015 0618   HDL 29* 06/01/2015 0618   CHOLHDL 5.3 06/01/2015 0618   VLDL 13 06/01/2015 0618   LDLCALC 113* 06/01/2015 0618   HgbA1c:  Lab Results  Component Value Date   HGBA1C 6.0* 06/01/2015   Urine Drug Screen:     Component Value Date/Time   LABOPIA NONE DETECTED 05/31/2015 2020   COCAINSCRNUR NONE DETECTED 05/31/2015 2020   LABBENZ NONE DETECTED 05/31/2015 2020   AMPHETMU NONE DETECTED 05/31/2015 2020   THCU POSITIVE* 05/31/2015 2020   LABBARB NONE DETECTED 05/31/2015 2020      IMAGING I have personally reviewed the radiological images below and agree  with the radiology interpretations.  Dg Chest Portable 1 View 06/11/2015   Mild increase in opacity at the right lung base. The apparent difference from the prior exam may be technical due to differences in patient positioning and lung volume. However, given the worsening symptoms, a developing bronchopneumonia should be considered. No evidence of pulmonary edema. No other change.   CT of head without contrast  06/12/2015 Enlarging subacute infarct in the right posterior basal ganglia. Negative for hemorrhage.  CTA head and neck 06/02/15   1. Bilateral cervical carotid atherosclerosis greater on the right. 60% stenosis of the right ICA origin. 2. Extensive calcified plaque in both ICA siphons with moderate bilateral supraclinoid segment stenosis. 3. Calcified plaque causes moderate to severe stenosis at the origin of the dominant right vertebral artery. Non dominant left vertebral artery arises directly from the arch and functionally terminates in PICA. 4. Stable circle of Willis branches from the recent MRA including moderate left PCA stenosis. 5. Stable CT appearance of the brain from earlier today. 6. Pulmonary emphysema.  MRI 05/31/15  1. 5 mm acute ischemic nonhemorrhagic infarct within the left centrum semi ovale.  No significant mass effect. 2. Remote lacunar infarcts involving the left lentiform nucleus and right thalamus. 3. Age-related cerebral atrophy with moderate chronic microvascular ischemic disease.  MRA 06/01/15 1. No evidence of medium or large vessel intracranial occlusion or flow-limiting proximal stenosis. 2. Mild left supraclinoid ICA and proximal left ACA stenoses. 3. Anterior and posterior circulation branch vessel irregularity and attenuation suggestive of atherosclerosis.  CUS 06/01/15 - Moderate calcified plaque at the level of both carotid bulbs and proximal internal carotid arteries. Plaque burden slightly more prominent on the right compared to the left by  ultrasound. Bilateral ICA stenoses are estimated at less than 50% based on velocity criteria. Based on grayscale imaging, the right ICA stenosis is likely closer to the 50% range.  2D echo  06/02/15 - Normal LV wall thickness with LVEF 25-30% and wall motionabnormaltiies as outlined above. 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 There is stranding andtrabeculation at the LV apex - no clearly formed mural thrombus,but evidence of relative stasis by microbubble contrast. Thiscould be a potential source of embolic stroke. Aortic valve notwell seen, appears sclerotic. Trivial mitral and tricuspidregurgitation. Cannot exclude PFO.  06/12/15 - Left ventricle: The cavity size was normal. Systolic function wasvigorous. The estimated ejection fraction was in the range of 70%to 75%. Wall motion was normal; there were no regional wallmotion abnormalities. Doppler parameters are consistent withabnormal left ventricular relaxation (grade 1 diastolicdysfunction). - Mitral valve: Moderately calcified annulus.  Bilateral lower extremity venous duplex  no obvious evidence of DVT or SVT noted in the visualized veins of the bilateral lower extremities.    PHYSICAL EXAM General - Well nourished, well developed, mild respiratory distress.  Ophthalmologic - Fundi not visualized due to noncooperation.  Cardiovascular - Regular rate and rhythm.  Mental Status -  Level of arousal and orientation to time, place, and person were intact. Language including expression, naming, repetition, comprehension was assessed and found intact. Fund of Knowledge was assessed and was intact.  Cranial Nerves II - XII - II - Visual field intact OU. III, IV, VI - Extraocular movements intact. V - Facial sensation intact bilaterally. VII - left facial droop. VIII - Hearing &  vestibular intact bilaterally. X - Palate elevates symmetrically. XI - Chin turning & shoulder shrug intact bilaterally. XII - Tongue protrusion intact.  Motor Strength - The patient's strength was 0/5 LUE and 2-/5 LLE, but 5/5 RUE and RLE.  Bulk was normal and fasciculations were absent.   Motor Tone - Muscle tone was assessed at the neck and appendages and was decreased on the left.  Reflexes - The patient's reflexes were 1+ in all extremities and she had no pathological reflexes.  Sensory - Light touch, temperature/pinprick were assessed and were symmetrical.    Coordination - The patient had normal movements in the right hand with no ataxia or dysmetria.  Tremor was absent.  Gait and Station - not able to test due to weakness.   ASSESSMENT/PLAN Ms. [name] is a 69 y.o. female with history of DM, HTN, tobacco use, low EF and questionable LV thrombus on previous echo, treated for a stroke in 05/31/2015 [facility], presenting with pneumonia and possible aspiration. She did not receive IV t-PA due to no new deficits.  Stroke:  Right posterior basal ganglia infarct, embolic pattern, secondary to unknown source. Asymptomatic acute small left CR infarct on MRI.   Resultant  Left hemiplegia  MRI  Small left centrum semi ovale infarct  Repeat CT head R moderate sized basal ganglia infarct  CTA head and neck - bilateral ICA siphone and diffuse intracranial stenosis  MRA  06/01/2015 - bilateral ICA siphone stenosis  Carotid Doppler - 06/01/2015 - bilateral ICA stenosis 50% or less.  LE venous dopplers negative for DVT  2D Echo EF 70-75% and no LV thrombus  Difficulty to perform TEE due to respiratory issue and overnight junctional rhythm, will recommend 30 day cardiac event monitoring as outpt to rule out afib. Continue tele monitoring while inpt.   LDL - 113  HgbA1c 6.0  VTE prophylaxis - Lovenox Diet NPO time specified Except for: Sips with Meds  aspirin 81 mg  daily and clopidogrel 75 mg daily prior to admission, now on aspirin 81 mg daily and clopidogrel 75 mg daily. Continue dual antiplatelet for 3 months.  Patient counseled to be compliant with her antithrombotic medications  Ongoing aggressive stroke risk factor management  Therapy recommendations: Pending  Disposition: Pending (at [facility])  Aspiration pneumonia  CXR concerning for aspiration  Repeat CXR improved aeration to the right lung base, however, there is new left base opacity  On Unasyn   Not able to lie flat so that MRI cancelled.  Hypertension  Improved but still intermittently low   D/c isordil   D/c clonidine and lisinopril  Continue lasix  Due to diffuse intracranial stenosis, BP goal 130-150  Hyperlipidemia  Home meds:  Pravachol 40 mg daily resumed in hospital  LDL 113, goal < 70  Increase pravastatin to  daily  Continue statin at discharge  Diabetes  HgbA1c 6.0, goal < 7.0  Controlled  SSI  Tobacco abuse  Current smoker  Smoking cessation counseling provided  Pt is willing to quit  Other Stroke Risk Factors  Advanced age  Marijuana use  Other Active Problems  Anemia  Probable dehydration with elevated BUN  Dysphagia  Hospital day # 2  Marvel Plan, MD PhD Stroke Neurology 06/13/2015 3:55 PM   To contact Stroke Continuity provider, please refer to [email or web address]. After hours, contact General Neurology

## 2015-06-14 ENCOUNTER — Inpatient Hospital Stay
Admission: RE | Admit: 2015-06-14 | Discharge: 2015-08-28 | Disposition: A | Discharge status: Other Acute Inpatient Hospital | Payer: Medicare Other | Source: Ambulatory Visit | Attending: Internal Medicine | Admitting: Internal Medicine

## 2015-06-14 ENCOUNTER — Other Ambulatory Visit: Payer: Self-pay | Admitting: Neurology

## 2015-06-14 DIAGNOSIS — M25571 Pain in right ankle and joints of right foot: Secondary | ICD-10-CM

## 2015-06-14 DIAGNOSIS — R002 Palpitations: Secondary | ICD-10-CM | POA: Insufficient documentation

## 2015-06-14 DIAGNOSIS — B370 Candidal stomatitis: Secondary | ICD-10-CM

## 2015-06-14 DIAGNOSIS — R609 Edema, unspecified: Secondary | ICD-10-CM

## 2015-06-14 DIAGNOSIS — E876 Hypokalemia: Secondary | ICD-10-CM

## 2015-06-14 DIAGNOSIS — J690 Pneumonitis due to inhalation of food and vomit: Principal | ICD-10-CM

## 2015-06-14 DIAGNOSIS — I4891 Unspecified atrial fibrillation: Secondary | ICD-10-CM

## 2015-06-14 DIAGNOSIS — M79671 Pain in right foot: Secondary | ICD-10-CM

## 2015-06-14 DIAGNOSIS — I639 Cerebral infarction, unspecified: Secondary | ICD-10-CM

## 2015-06-14 DIAGNOSIS — IMO0001 Reserved for inherently not codable concepts without codable children: Secondary | ICD-10-CM

## 2015-06-14 LAB — CBC WITH DIFFERENTIAL/PLATELET
BASOS ABS: 0 10*3/uL (ref 0.0–0.1)
BASOS PCT: 0 %
Eosinophils Absolute: 0 10*3/uL (ref 0.0–0.7)
Eosinophils Relative: 0 %
HEMATOCRIT: 28.8 % — AB (ref 36.0–46.0)
HEMOGLOBIN: 9.5 g/dL — AB (ref 12.0–15.0)
Lymphocytes Relative: 10 %
Lymphs Abs: 1.9 10*3/uL (ref 0.7–4.0)
MCH: 26.5 pg (ref 26.0–34.0)
MCHC: 33 g/dL (ref 30.0–36.0)
MCV: 80.2 fL (ref 78.0–100.0)
MONOS PCT: 7 %
Monocytes Absolute: 1.2 10*3/uL — ABNORMAL HIGH (ref 0.1–1.0)
NEUTROS ABS: 15.7 10*3/uL — AB (ref 1.7–7.7)
NEUTROS PCT: 83 %
Platelets: 219 10*3/uL (ref 150–400)
RBC: 3.59 MIL/uL — AB (ref 3.87–5.11)
RDW: 13.7 % (ref 11.5–15.5)
WBC: 18.9 10*3/uL — ABNORMAL HIGH (ref 4.0–10.5)

## 2015-06-14 LAB — BASIC METABOLIC PANEL
ANION GAP: 7 (ref 5–15)
BUN: 18 mg/dL (ref 6–20)
CALCIUM: 8.1 mg/dL — AB (ref 8.9–10.3)
CO2: 32 mmol/L (ref 22–32)
Chloride: 100 mmol/L — ABNORMAL LOW (ref 101–111)
Creatinine, Ser: 0.59 mg/dL (ref 0.44–1.00)
GFR calc Af Amer: >60 mL/min (ref >60)
GFR calc non Af Amer: >60 mL/min (ref >60)
GLUCOSE: 110 mg/dL — AB (ref 65–99)
Potassium: 3.4 mmol/L — ABNORMAL LOW (ref 3.5–5.1)
Sodium: 139 mmol/L (ref 135–145)

## 2015-06-14 LAB — URINE CULTURE
Culture: >=100000
Special Requests: NORMAL

## 2015-06-14 LAB — PROTIME-INR
INR: 1.14 (ref 0.00–1.49)
PROTHROMBIN TIME: 14.8 s (ref 11.6–15.2)

## 2015-06-14 LAB — GLUCOSE, CAPILLARY
GLUCOSE-CAPILLARY: 103 mg/dL — AB (ref 65–99)
Glucose-Capillary: 151 mg/dL — ABNORMAL HIGH (ref 65–99)

## 2015-06-14 LAB — MAGNESIUM: Magnesium: 2 mg/dL (ref 1.7–2.4)

## 2015-06-14 MED ORDER — AMOXICILLIN-POT CLAVULANATE 500-125 MG PO TABS
1.0000 | ORAL_TABLET | Freq: Three times a day (TID) | ORAL | Status: DC
  Filled 2015-06-14 (×3): qty 1

## 2015-06-14 MED ORDER — AMOXICILLIN-POT CLAVULANATE 500-125 MG PO TABS: 1.0000 | ORAL_TABLET | Freq: Three times a day (TID) | ORAL | Status: AC

## 2015-06-14 MED ORDER — PREDNISONE 10 MG PO TABS: ORAL_TABLET | ORAL | Status: DC

## 2015-06-14 MED ORDER — PRAVASTATIN SODIUM 40 MG PO TABS: 80.0000 mg | ORAL_TABLET | Freq: Every evening | ORAL | Status: AC

## 2015-06-14 MED ORDER — SENNOSIDES-DOCUSATE SODIUM 8.6-50 MG PO TABS: 1.0000 | ORAL_TABLET | Freq: Every evening | ORAL | Status: DC | PRN

## 2015-06-14 MED ORDER — GUAIFENESIN ER 600 MG PO TB12: 600.0000 mg | ORAL_TABLET | Freq: Two times a day (BID) | ORAL | Status: AC

## 2015-06-14 MED ORDER — TRAZODONE HCL 50 MG PO TABS: 50.0000 mg | ORAL_TABLET | Freq: Every evening | ORAL | Status: AC | PRN

## 2015-06-14 MED ORDER — NYSTATIN 100000 UNIT/ML MT SUSP: 5.0000 mL | Freq: Four times a day (QID) | OROMUCOSAL | Status: DC

## 2015-06-14 MED ORDER — HYDROCODONE-ACETAMINOPHEN 5-325 MG PO TABS: 1.0000 | ORAL_TABLET | Freq: Four times a day (QID) | ORAL | Status: DC | PRN

## 2015-06-14 MED ORDER — POTASSIUM CHLORIDE CRYS ER 20 MEQ PO TBCR
20.0000 meq | EXTENDED_RELEASE_TABLET | Freq: Once | ORAL | Status: AC
  Administered 2015-06-14: 20 meq via ORAL
  Filled 2015-06-14: qty 1

## 2015-06-14 NOTE — Care Management Important Message (Signed)
Important Message  Patient Details  Name: Tara GuyJane F Park MRN: [id number] Date of Birth: [birth year]   Medicare Important Message Given:  Yes    Kyla BalzarineShealy, Aika Brzoska Abena 06/14/2015, 2:06 PM

## 2015-06-14 NOTE — Clinical Social Work Note (Addendum)
Per MD patient ready to DC back to [facility]. RN, patient/family Maurine Minister(Dennis and Kathlene NovemberMike), and facility notified of patient's DC. RN given number for report. DC packet on patient's chart. Ambulance transport requested for patient. CSW signing off at this time.   Roddie McBryant Amelita Risinger MSW, NordicLCSW, [place], [phone]

## 2015-06-14 NOTE — Progress Notes (Signed)
Speech Language Pathology Treatment: Dysphagia  Patient Details Name: Tara GuyJane F Carico MRN: [id number] DOB: 06-25-1946 Today's Date: 06/14/2015 Time: 0800-0812 SLP Time Calculation (min) (ACUTE ONLY): 12 min  Assessment / Plan / Recommendation Clinical Impression  Pt has partially reclined and eating her meal with no supervision prior to SLP arrival. SLP repositioned pt and provided education on the importance of sitting upright to reduce her risk of aspiration with her very conservative diet. Pt had bilateral anterior loss of bolus, but otherwise tolerated puree and honey thick liquids. Pt consistently is SOB and breathes heavily throughout meal. SLP provided education on eating slowly, taking breaks, and reinforced sitting upright. SLP recommends pt continue current diet, will continue to follow with diet maintenance and upgrade with objectively swallow evaluation when current pneumonia improves.   HPI HPI: 69 year old woman with PMH: dysphagia, MBS ( 06/08/15) recommended Dys 1, nectar thick liquids, DM, HTN, COPD, tobacco abuse just discharged from the hospital yesterday after suffering an acute ischemic admitted with SOB. Chest x-ray shows a developing infiltrate in the right lung base. [facility]      SLP Plan  Continue with current plan of care     Recommendations  Diet recommendations: Dysphagia 1 (puree);Honey-thick liquid Liquids provided via: Cup;No straw Medication Administration: Whole meds with puree Supervision: Full supervision/cueing for compensatory strategies;Staff to assist with self feeding Compensations: Minimize environmental distractions;Slow rate;Small sips/bites;Follow solids with liquid Postural Changes and/or Swallow Maneuvers: Seated upright 90 degrees;Upright 30-60 min after meal              Oral Care Recommendations: Oral care BID Plan: Continue with current plan of care  Riccardo DubinKristen Tyjay Galindo, Student-SLP  Riccardo DubinKristen Clifton Kovacic 06/14/2015, 8:44  AM

## 2015-06-14 NOTE — Discharge Summary (Addendum)
[facility] Discharge Summary   Patient: Tara Park    ZOX:[id number] PCP: Isabella Stalling, MD  DOB: 17-Nov-1945 Date of admission: 06/11/2015  Date of discharge: 06/14/2015   Discharge Diagnoses:  Principal Problem:   Aspiration pneumonia (HCC) Active Problems:   Tobacco abuse   Diabetes mellitus without complication (HCC)   Hypertension   Acute ischemic stroke (HCC)   Demand ischemia (HCC)   Hypokalemia   Chronic systolic CHF (congestive heart failure) (HCC)   Mural thrombus of cardiac apex (HCC)   COPD (chronic obstructive pulmonary disease) (HCC)   Chronic hypoxemic respiratory failure (HCC)   Drowsiness   UTI (urinary tract infection)   Oral thrush   Recommendations for Outpatient Follow-up:  1. Follow-up with cardiology for 30 day event monitor 2. Follow-up with cardiology for CHF, possible TEE as an outpatient when stable 3. Follow-up with neurology for CVA 4. Goal blood pressure 130 to 150 5. Goal LDL less than 70 6.  continue current care with physical therapy at [facility] nursing facility   Diet recommendation: please follow diet recommendations strictly Continue speech therapy as an outpatient Diet recommendations: Dysphagia 1 (puree);Honey-thick liquid Liquids provided via: Cup;No straw Medication Administration: Whole meds with puree Supervision: Full supervision/cueing for compensatory strategies;Staff to assist with self feeding Compensations: Minimize environmental distractions;Slow rate;Small sips/bites;Follow solids with liquid Postural Changes and/or Swallow Maneuvers: Seated upright 90 degrees;Upright 30-60 min after meal  Activity: increase activity slowly as tolerated   Discharge Condition: fair  History of present illness:  as per the H&P by Dr. Ardyth Harps "Patient is a 69 year old woman with multiple medical comorbidities including COPD on 4 L of chronic oxygen, hypertension, diabetes, tobacco abuse who was just discharged from the  hospital yesterday after suffering an acute ischemic CVA. While at her skilled nursing facility she states she was very short of breath last night and today and finally asked to be transferred to the emergency department for evaluation. Chest x-ray shows a developing infiltrate in the right lung base, lab workup is indicative of hypokalemia with a potassium of 3.2, WBC count of 28.1, troponin is 0.20. Interestingly, during her workup for stroke a 2-D echo was performed on 06/02/2015 that showed a severely reduced ejection fraction in the range of 25-30%, akinesis of the apical lateral septal, anterior, inferior and apical myocardium. Findings suggestive of an ischemic cardiomyopathy. There is stranding and trabeculation at the LV apex which could potentially be a source of embolic stroke, possibility of thrombus is not excluded. We are asked to admit her for further evaluation and management. Patient denies cough, chest pain, nausea, vomiting." Hospital Course:  Patient initially presented at [facility] with the complaints of shortness of breath. She was found to have aspiration pneumonia. Workup also showed mild increase in troponin and prior echocardiogram did show questionable obesity on the apex concerning for thrombus. Phone consultation with cardiology recommended repeating an echocardiogram with contrast, repeat cardiogram did not show any evidence of thrombus and ejection fraction improved from 25-30% to 70-75% with diastolic dysfunction. Cardiology recommended outpatient follow-up.    Summary of her active problems in the hospital is as following. 1. Aspiration pneumonia (HCC) UTI The patient is presenting with complaints of shortness of breath. X-ray shows increased opacity at the right base with leukocytosis and hypoxia concerning for pneumonia. Patient was started on Unasyn. Patient's breathing improved gradually, her wheezing resolved gradually. Urine culture grew Escherichia  coli which was pansensitive, blood cultures remain negative and the patient will  be discharged on Augmentin to complete a 14 day course of antibiotic. She was continued on prednisone which will be adjusted at the nursing home to taper off slowly. Continue nebulizers. Patient did develop oral thrush for which she was started on nystatin. Speech therapy consulted. Recommend dysphagia type I diet.  2. Recent CVA. Neurology was consulted, and the patient had a repeat CT of the head, which showed right basal ganglia infarct, while her prior CT scan and MRI was showing infarct on the left side. Discuss with cardiology as well as neurology and the patient will get 30 day event monitor as an outpatient. Patient will be reevaluated as an outpatient with a possible TEE. Continuing aspirin and Plavix at present. Telemetry remained sinus tachycardia with occasional junctional rhythm without any atrial flutter or fibrillation during the hospitalization. Goal blood pressure 130-150 systolic.  Her BP medication were discontinued.  3. Abnormal echocardiogram Repeat Echocardiogram did not show any evidence of mural thrombosis. Also there is significant improvement in patient's ejection fraction. Outpatient follow up with cardiology.  4. COPD. Wheezing is resolved Continuing nebulizers, and taper steroids.  5 Diabetes mellitus without complication (HCC) Blood sugars remained controlled.  6 Hypertension Blood pressure stable. We'll continue close monitoring. Neurology recommends permissive hypertension due to patient's progressive stroke and intracranial stenosis. Discontinued blood pressure medication.  7 Demand ischemia (HCC) EKG no acute ST-T wave changes suggesting ischemia. Repeat echocardiogram pending. Cardiology considers this demand ischemia most likely due to her aspiration pneumonia. Outpatient follow up.  8 Chronic systolic CHF (congestive heart failure) (HCC) Continuing Lasix  at present.  All other chronic medical condition were stable during the hospitalization. Patient will be seen by physical therapy,at SNF, which was arranged by social worker and case Production designer, theatre/television/film. On the day of the discharge the patient's symptoms improved, and no other acute medical condition were reported by patient. the patient was felt safe to be discharge at [facility] SNF.  Procedures and Results: Bilateral Lower Extremity Venous Duplex Evaluation Summary: No obvious evidence of deep vein or superficial thrombosis involving the right lower extremity and left lower extremity.  Transthoracic Echocardiography Study Conclusions  - Left ventricle: The cavity size was normal. Systolic function was vigorous. The estimated ejection fraction was in the range of 70% to 75%. Wall motion was normal; there were no regional wall motion abnormalities. Doppler parameters are consistent with abnormal left ventricular relaxation (grade 1 diastolic dysfunction). - Mitral valve: Moderately calcified annulus.  Consultations:  Phone consultation with cardiology  Neurology  Discharge Exam: Filed Weights   06/12/15 0500 06/13/15 0517 06/14/15 0500  Weight: 70.852 kg (156 lb 3.2 oz) 70.171 kg (154 lb 11.2 oz) 69.673 kg (153 lb 9.6 oz)   Filed Vitals:   06/13/15 2047 06/14/15 0500  BP:  131/74  Pulse: 92 95  Temp:  98.6 F (37 C)  Resp: 20 18   General: Appear in mild distress, no Rash; Oral Mucosa moist. THRUSH PRESENT Cardiovascular: S1 and S2 Present, aortic systolic Murmur, no JVD Respiratory: Bilateral Air entry present and bilateral basal Crackles, expiratory wheezes resolved Abdomen: Bowel Sound present, Soft and no tenderness Extremities: Bilateral Pedal edema, no calf tenderness Neurology: Grossly no focal neuro deficit Other than decrease motor strength on the left and left facial droop secondary to recent CVA  DISCHARGE MEDICATION: Discharge Instructions    Ambulatory  referral to Cardiology    Complete by:  As directed   CHF, evaluation for TEE     Ambulatory referral to  Neurology    Complete by:  As directed   Pt will follow up with Dr. Roda Shutters at [facility] in about 2 months. Thanks.          Current Discharge Medication List    START taking these medications   Details  amoxicillin-clavulanate (AUGMENTIN) 500-125 MG tablet Take 1 tablet (500 mg total) by mouth every 8 (eight) hours. Qty: 21 tablet, Refills: 0    guaiFENesin (MUCINEX) 600 MG 12 hr tablet Take 1 tablet (600 mg total) by mouth 2 (two) times daily. Qty: 14 tablet, Refills: 0    nystatin (MYCOSTATIN) 100000 UNIT/ML suspension Take 5 mLs (500,000 Units total) by mouth 4 (four) times daily. Qty: 60 mL, Refills: 0    senna-docusate (SENOKOT-S) 8.6-50 MG tablet Take 1 tablet by mouth at bedtime as needed for mild constipation. Qty: 20 tablet, Refills: 0      CONTINUE these medications which have CHANGED   Details  pravastatin (PRAVACHOL) 40 MG tablet Take 2 tablets (80 mg total) by mouth every evening. Qty: 30 tablet, Refills: 0    predniSONE (DELTASONE) 10 MG tablet Take 40 mg for 4 days, then Take 30 mg for 3 days, then Take 20 mg for 3 days, Take 10 mg for 3 days, then stop.40 Qty: 30 tablet, Refills: 0    traZODone (DESYREL) 50 MG tablet Take 1 tablet (50 mg total) by mouth at bedtime as needed for sleep.      CONTINUE these medications which have NOT CHANGED   Details  albuterol (PROVENTIL) (2.5 MG/3ML) 0.083% nebulizer solution Take 2.5 mg by nebulization every 6 (six) hours as needed for wheezing or shortness of breath.    aspirin EC 81 MG tablet Take 81 mg by mouth daily.    clopidogrel (PLAVIX) 75 MG tablet Take 1 tablet (75 mg total) by mouth daily with breakfast. Qty: 30 tablet, Refills: 3    COMBIVENT RESPIMAT 20-100 MCG/ACT AERS respimat Inhale 2 puffs into the lungs 4 (four) times daily.     furosemide (LASIX) 20 MG tablet Take 1 tablet (20 mg total) by mouth  daily. Qty: 30 tablet, Refills: 0    HYDROcodone-acetaminophen (NORCO) 5-325 MG tablet Take 1 tablet by mouth every 6 (six) hours as needed for moderate pain. Qty: 30 tablet, Refills: 0    metFORMIN (GLUCOPHAGE) 500 MG tablet Take 500 mg by mouth 2 (two) times daily with a meal.      STOP taking these medications     cloNIDine (CATAPRES) 0.2 MG tablet      digoxin (LANOXIN) 0.125 MG tablet      isosorbide mononitrate (IMDUR) 30 MG 24 hr tablet      lisinopril (PRINIVIL,ZESTRIL) 10 MG tablet      aspirin 300 MG suppository        No Known Allergies Follow-up Information    Follow up with Xu,Jindong, MD. Schedule an appointment as soon as possible for a visit in 2 months.   Specialty:  Neurology   Why:  stroke clinic   Contact information:   [address] [place] [address] [phone]       The results of significant diagnostics from this hospitalization (including imaging, microbiology, ancillary and laboratory) are listed below for reference.    Significant Diagnostic Studies: Ct Angio Head W/cm &/or Wo Cm  06/02/2015  CLINICAL DATA:  69 year old female with left side weakness found to have left MCA white matter lacunar infarct on MRI. Initial encounter. EXAM: CT ANGIOGRAPHY HEAD  AND NECK TECHNIQUE: Multidetector CT imaging of the head and neck was performed using the standard protocol during bolus administration of intravenous contrast. Multiplanar CT image reconstructions and MIPs were obtained to evaluate the vascular anatomy. Carotid stenosis measurements (when applicable) are obtained utilizing NASCET criteria, using the distal internal carotid diameter as the denominator. CONTRAST:  75mL OMNIPAQUE IOHEXOL 350 MG/ML SOLN COMPARISON:  Head CT without contrast 1357 hours today. Brain MRI 05/31/2015. Intracranial MRA 06/01/2015. FINDINGS: CTA NECK Skeleton: No acute osseous abnormality identified. Degenerative changes and osteopenia in the spine. Absent  dentition. Paranasal sinuses and mastoids are clear. Other neck: Moderate to severe pulmonary emphysema. No superior mediastinal lymphadenopathy. Thyroid, larynx, pharynx, parapharyngeal spaces, retropharyngeal space, sublingual space, submandibular glands, and parotid glands are within normal limits. Postoperative changes to the globes. Negative scalp soft tissues. No cervical lymphadenopathy. Aortic arch: 4 vessel arch configuration, the left vertebral artery arises directly from the arch (series 9, image 21). Moderate mostly calcified arch atherosclerosis. No great vessel origin stenosis. Right carotid system: Partially retropharyngeal course of the right CCA. No right CCA stenosis. At the right carotid bifurcation there is soft and calcified plaque resulting in 60 % stenosis with respect to the distal vessel at the right ICA origin and lesser stenosis in the bulb. Distal to the bulb the cervical right ICA is negative. Left carotid system: Negative left CCA. At the left carotid bifurcation there is mostly calcified atherosclerosis with no definite hemodynamically significant proximal left ICA stenosis. There is also mild calcified plaque distal to the bulb just below the skullbase, but no cervical left ICA stenosis. Vertebral arteries:No proximal right subclavian artery stenosis despite calcified plaque. Dense calcified plaque at the right vertebral artery origin with moderate to severe associated stenosis. Tortuous right V1 segment. The right vertebral artery is dominant. There is occasional V2 segment calcified plaque. No other vertebral artery stenosis in the neck. The will left vertebral artery is non dominant, diminutive, and arises directly from the arch. No left vertebral artery stenosis identified. CTA HEAD Posterior circulation: Dominant distal right vertebral artery with V4 segment calcified plaque resulting in moderate to severe stenosis best seen on series 605, image 105. The non dominant left  vertebral artery is diminutive and functionally terminates in PICA. The basilar artery is diminutive and mildly tortuous. There is mild proximal third basilar artery stenosis. The SCA origins are normal. Fetal type bilateral PCA origins. Right PCA branches are within normal limits. There is left P2 segment moderate stenosis with preserved distal enhancement. Anterior circulation: Densely calcified bilateral cavernous and supra clinoid ICA siphon segments. Moderate bilateral supraclinoid segment stenosis seen on the recent MRA comparison as anterior genu and proximal supr clinoid signal loss bilaterally which was probably suspected due to artifact on that study. Ophthalmic artery and posterior communicating artery origins are normal. Both carotid termini remain patent. MCA origins are normal. Moderate left ACA origin stenosis as seen on the MRA. Normal right ACA origin. Diminutive or absent anterior communicating artery. Bilateral ACA branches are within normal limits. Right MCA M1 segment, bifurcation, and right MCA branches are within normal limits. Left MCA M1 segment, bifurcation, and left MCA branches are within normal limits. Venous sinuses: Patent. Anatomic variants: Non dominant left vertebral artery that arises directly from the arch. Fetal type bilateral PCA origins. Delayed phase: No midline shift, mass effect, or evidence of intracranial mass lesion. Stable gray-white matter differentiation with Patchy and confluent bilateral white matter hypodensity. Chronic left basal ganglia lacunar infarcts. No acute intracranial  hemorrhage identified. No abnormal enhancement identified. IMPRESSION: 1. Bilateral cervical carotid atherosclerosis greater on the right. 60% stenosis of the right ICA origin. 2. Extensive calcified plaque in both ICA siphons with moderate bilateral supraclinoid segment stenosis. 3. Calcified plaque causes moderate to severe stenosis at the origin of the dominant right vertebral artery. Non  dominant left vertebral artery arises directly from the arch and functionally terminates in PICA. 4. Stable circle of Willis branches from the recent MRA including moderate left PCA stenosis. 5. Stable CT appearance of the brain from earlier today. 6. Pulmonary emphysema. Electronically Signed   By: Odessa FlemingH  Hall M.D.   On: 06/02/2015 18:24   Dg Chest 2 View  05/31/2015  CLINICAL DATA:  Sudden onset of left-sided weakness, shortness of breath and cough tonight. EXAM: CHEST  2 VIEW COMPARISON:  05/12/2013. FINDINGS: The cardiac silhouette, mediastinal and hilar contours are within normal limits and stable. There is tortuosity of the thoracic aorta. There are chronic emphysematous and bronchitic lung changes but no acute overlying pulmonary process. No definite pleural effusions. The bony thorax appears stable. Numerous remote thoracic and lumbar compression deformities and osteoporosis. IMPRESSION: Chronic emphysematous and bronchitic lung changes but no acute overlying pulmonary process. Electronically Signed   By: Rudie MeyerP.  Gallerani M.D.   On: 05/31/2015 23:04   Ct Head Wo Contrast  06/12/2015  CLINICAL DATA:  Stroke.  Hypertension and diabetes EXAM: CT HEAD WITHOUT CONTRAST TECHNIQUE: Contiguous axial images were obtained from the base of the skull through the vertex without intravenous contrast. COMPARISON:  CT 06/02/2015, 06/04/2015 FINDINGS: Enlarging area of acute/ subacute infarct in the right basal ganglia which has progressed in size. This now measures approximately 18 x 28 mm. No associated hemorrhage Generalized atrophy. Chronic microvascular ischemic changes throughout the white matter. Negative for hemorrhage or mass. Calvarium intact. IMPRESSION: Enlarging subacute infarct in the right posterior basal ganglia. Negative for hemorrhage. Electronically Signed   By: Marlan Palauharles  Clark M.D.   On: 06/12/2015 15:24   Ct Head Wo Contrast  06/04/2015  CLINICAL DATA:  Left arm weakness. EXAM: CT HEAD WITHOUT  CONTRAST TECHNIQUE: Contiguous axial images were obtained from the base of the skull through the vertex without intravenous contrast. COMPARISON:  Yesterday FINDINGS: No new or acute osseous or extracranial findings. New ovoid low-density in the right putamen and corona radiata consistent with interval infarct. Left centrum semiovale infarct noted on brain MRI 05/31/2015 is largely obscured by extensive chronic small vessel ischemic gliosis. Remote lacune in the left putamen with flattened cavity. No hemorrhagic conversion. No hydrocephalus or shift. Age related cerebral volume loss. IMPRESSION: 1. Acute, nonhemorrhagic infarct in the right putamen and corona radiata that is new from head CT yesterday. 2. Recently demonstrated left corona radiata infarct is obscured by extensive chronic ischemic gliosis. Electronically Signed   By: Marnee SpringJonathon  Watts M.D.   On: 06/04/2015 05:08   Ct Head Wo Contrast  06/02/2015  ADDENDUM REPORT: 06/02/2015 14:44 ADDENDUM: These results were called by telephone at the time of interpretation on 06/02/2015 at 2:44 pm to Dr. Gerilyn PilgrimONQUAH, who verbally acknowledged these results. Electronically Signed   By: Delbert PhenixJason A Poff M.D.   On: 06/02/2015 14:44  06/02/2015  CLINICAL DATA:  CVA.  Cough.  Altered mental status. EXAM: CT HEAD WITHOUT CONTRAST TECHNIQUE: Contiguous axial images were obtained from the base of the skull through the vertex without intravenous contrast. COMPARISON:  05/31/2015 MRI brain. FINDINGS: No evidence of parenchymal hemorrhage or extra-axial fluid collection. No mass lesion, mass effect,  or midline shift. There is patchy hypodensity in the left deep white matter at the site of the recently described left centrum semiovale ischemic infarct on the brain MRI from 2 days prior. There is additional prominent patchy periventricular and deep white matter hypodensity bilaterally. There is intracranial atherosclerosis. Old lacunar infarcts are noted in the right thalamus and  left basal ganglia. Cerebral volume is age appropriate. No ventriculomegaly. The visualized paranasal sinuses are essentially clear. The mastoid air cells are unopacified. No evidence of calvarial fracture. IMPRESSION: 1. Patchy hypodensity in the left deep white matter at the site of the recently described left centrum semiovale ischemic infarct on the brain MRI from 2 days prior, in keeping with a subacute left frontal white matter infarct. No acute intracranial hemorrhage, mass effect or midline shift. 2. Intracranial atherosclerosis, old right thalamic and left basal ganglia lacunar infarcts and prominent chronic small vessel ischemic white matter change. These results were called by telephone at the time of interpretation on 06/02/2015 at 2:13 pm to Dr. Oval Linsey , who verbally acknowledged these results. Electronically Signed: By: Delbert Phenix M.D. On: 06/02/2015 14:19   Ct Angio Neck W/cm &/or Wo/cm  06/02/2015  CLINICAL DATA:  69 year old female with left side weakness found to have left MCA white matter lacunar infarct on MRI. Initial encounter. EXAM: CT ANGIOGRAPHY HEAD AND NECK TECHNIQUE: Multidetector CT imaging of the head and neck was performed using the standard protocol during bolus administration of intravenous contrast. Multiplanar CT image reconstructions and MIPs were obtained to evaluate the vascular anatomy. Carotid stenosis measurements (when applicable) are obtained utilizing NASCET criteria, using the distal internal carotid diameter as the denominator. CONTRAST:  75mL OMNIPAQUE IOHEXOL 350 MG/ML SOLN COMPARISON:  Head CT without contrast 1357 hours today. Brain MRI 05/31/2015. Intracranial MRA 06/01/2015. FINDINGS: CTA NECK Skeleton: No acute osseous abnormality identified. Degenerative changes and osteopenia in the spine. Absent dentition. Paranasal sinuses and mastoids are clear. Other neck: Moderate to severe pulmonary emphysema. No superior mediastinal lymphadenopathy.  Thyroid, larynx, pharynx, parapharyngeal spaces, retropharyngeal space, sublingual space, submandibular glands, and parotid glands are within normal limits. Postoperative changes to the globes. Negative scalp soft tissues. No cervical lymphadenopathy. Aortic arch: 4 vessel arch configuration, the left vertebral artery arises directly from the arch (series 9, image 21). Moderate mostly calcified arch atherosclerosis. No great vessel origin stenosis. Right carotid system: Partially retropharyngeal course of the right CCA. No right CCA stenosis. At the right carotid bifurcation there is soft and calcified plaque resulting in 60 % stenosis with respect to the distal vessel at the right ICA origin and lesser stenosis in the bulb. Distal to the bulb the cervical right ICA is negative. Left carotid system: Negative left CCA. At the left carotid bifurcation there is mostly calcified atherosclerosis with no definite hemodynamically significant proximal left ICA stenosis. There is also mild calcified plaque distal to the bulb just below the skullbase, but no cervical left ICA stenosis. Vertebral arteries:No proximal right subclavian artery stenosis despite calcified plaque. Dense calcified plaque at the right vertebral artery origin with moderate to severe associated stenosis. Tortuous right V1 segment. The right vertebral artery is dominant. There is occasional V2 segment calcified plaque. No other vertebral artery stenosis in the neck. The will left vertebral artery is non dominant, diminutive, and arises directly from the arch. No left vertebral artery stenosis identified. CTA HEAD Posterior circulation: Dominant distal right vertebral artery with V4 segment calcified plaque resulting in moderate to severe stenosis best seen on  series 605, image 105. The non dominant left vertebral artery is diminutive and functionally terminates in PICA. The basilar artery is diminutive and mildly tortuous. There is mild proximal third  basilar artery stenosis. The SCA origins are normal. Fetal type bilateral PCA origins. Right PCA branches are within normal limits. There is left P2 segment moderate stenosis with preserved distal enhancement. Anterior circulation: Densely calcified bilateral cavernous and supra clinoid ICA siphon segments. Moderate bilateral supraclinoid segment stenosis seen on the recent MRA comparison as anterior genu and proximal supr clinoid signal loss bilaterally which was probably suspected due to artifact on that study. Ophthalmic artery and posterior communicating artery origins are normal. Both carotid termini remain patent. MCA origins are normal. Moderate left ACA origin stenosis as seen on the MRA. Normal right ACA origin. Diminutive or absent anterior communicating artery. Bilateral ACA branches are within normal limits. Right MCA M1 segment, bifurcation, and right MCA branches are within normal limits. Left MCA M1 segment, bifurcation, and left MCA branches are within normal limits. Venous sinuses: Patent. Anatomic variants: Non dominant left vertebral artery that arises directly from the arch. Fetal type bilateral PCA origins. Delayed phase: No midline shift, mass effect, or evidence of intracranial mass lesion. Stable gray-white matter differentiation with Patchy and confluent bilateral white matter hypodensity. Chronic left basal ganglia lacunar infarcts. No acute intracranial hemorrhage identified. No abnormal enhancement identified. IMPRESSION: 1. Bilateral cervical carotid atherosclerosis greater on the right. 60% stenosis of the right ICA origin. 2. Extensive calcified plaque in both ICA siphons with moderate bilateral supraclinoid segment stenosis. 3. Calcified plaque causes moderate to severe stenosis at the origin of the dominant right vertebral artery. Non dominant left vertebral artery arises directly from the arch and functionally terminates in PICA. 4. Stable circle of Willis branches from the recent  MRA including moderate left PCA stenosis. 5. Stable CT appearance of the brain from earlier today. 6. Pulmonary emphysema. Electronically Signed   By: Odessa Fleming M.D.   On: 06/02/2015 18:24   Mr [place] Head Wo Contrast  06/01/2015  CLINICAL DATA:  Left-sided weakness. Acute infarct in the left centrum semiovale on MRI yesterday. EXAM: MRA HEAD WITHOUT CONTRAST TECHNIQUE: Angiographic images of the Circle of Willis were obtained using MRA technique without intravenous contrast. COMPARISON:  None. FINDINGS: The visualized distal right vertebral artery is patent and dominant without stenosis. The distal left vertebral artery appears hypoplastic. PICA origins are patent. Right AICA and bilateral SCA origins are patent. Basilar artery is patent and slightly small in caliber developmentally without superimposed stenosis. There are prominent posterior communicating arteries bilaterally. The right P1 segment is hypoplastic, and the left P1 segment may be absent. The proximal P2 segments are widely patent. The more distal PCAs are not well evaluated due to diminished flow related signal, and underlying mid to distal P2 stenoses are not excluded. The internal carotid arteries are patent from skullbase to carotid termini. There is bilateral carotid siphon atherosclerotic irregularity with minimal proximal right cavernous stenosis and mild proximal left supraclinoid ICA stenosis. The M1 segments are patent without stenosis. MCA bifurcations are patent without evidence of major branch vessel occlusion, however there is mild-to-moderate MCA branch vessel irregularity and attenuation bilaterally. Right A1 segment is widely patent. There is a mild proximal left A1 stenosis. No intracranial aneurysm is identified. IMPRESSION: 1. No evidence of medium or large vessel intracranial occlusion or flow-limiting proximal stenosis. 2. Mild left supraclinoid ICA and proximal left ACA stenoses. 3. Anterior and posterior circulation branch vessel  irregularity and attenuation suggestive of atherosclerosis. Electronically Signed   By: Sebastian Ache M.D.   On: 06/01/2015 09:02   Mr Brain Wo Contrast (neuro Protocol)  05/31/2015  CLINICAL DATA:  Initial evaluation for acute onset left-sided weakness. EXAM: MRI HEAD WITHOUT CONTRAST TECHNIQUE: Multiplanar, multiecho pulse sequences of the brain and surrounding structures were obtained without intravenous contrast. COMPARISON:  None. FINDINGS: There is a in 5 mm focus of restricted diffusion involving the deep white matter of the left centrum semi ovale and the mid to posterior left frontal lobe (series 100, image 38), consistent with a small acute ischemic infarct. No associated edema or hemorrhage. No other acute ischemic infarct. Normal intracranial vascular flow voids are maintained. No acute intracranial hemorrhage. Mild age-related cerebral atrophy is present. Patchy and confluent T2/FLAIR hyperintensity within the periventricular and deep white matter both cerebral hemispheres most likely related to chronic small vessel ischemic disease, moderate nature. Similar changes seen within the pons. Remote hemorrhagic lacunar type infarct within the left lentiform nucleus. Additional remote lacunar infarct within the right thalamus. No mass lesion, midline shift, or mass effect. No hydrocephalus. No extra-axial fluid collection. Craniocervical junction within normal limits. Pituitary gland normal. No acute abnormality about the orbits. Sequela prior bilateral lens extraction noted. Minimal layering fluid within the maxillary sinuses bilaterally. Paranasal sinuses and mastoid air cells are otherwise clear. Inner ear structures grossly normal. Bone marrow signal intensity within normal limits. No scalp soft tissue abnormality. IMPRESSION: 1. 5 mm acute ischemic nonhemorrhagic infarct within the left centrum semi ovale. No significant mass effect. 2. Remote lacunar infarcts involving the left lentiform nucleus and  right thalamus. 3. Age-related cerebral atrophy with moderate chronic microvascular ischemic disease. Electronically Signed   By: Rise Mu M.D.   On: 05/31/2015 20:20   US Carotid Bilateral  06/01/2015  CLINICAL DATA:  Left hemispheric cerebral infarction, hypertension, syncope, hyperlipidemia, diabetes and tobacco use. EXAM: BILATERAL CAROTID DUPLEX ULTRASOUND TECHNIQUE: Wallace Cullens scale imaging, color Doppler and duplex ultrasound were performed of bilateral carotid and vertebral arteries in the neck. COMPARISON:  None. FINDINGS: Criteria: Quantification of carotid stenosis is based on velocity parameters that correlate the residual internal carotid diameter with NASCET-based stenosis levels, using the diameter of the distal internal carotid lumen as the denominator for stenosis measurement. The following velocity measurements were obtained: RIGHT ICA:  102/24 cm/sec CCA:  81/15 cm/sec SYSTOLIC ICA/CCA RATIO:  1.3 DIASTOLIC ICA/CCA RATIO:  1.6 ECA:  223 cm/sec LEFT ICA:  75/19 cm/sec CCA:  54/13 cm/sec SYSTOLIC ICA/CCA RATIO:  1.4 DIASTOLIC ICA/CCA RATIO:  1.4 ECA:  187 cm/sec RIGHT CAROTID ARTERY: There is a moderate amount of predominately calcified plaque at the level of the carotid bulb and proximal internal carotid artery. Based on velocities, estimated right ICA stenosis is less than 50%. Based on grayscale images, stenosis is likely closer to the 50% range. RIGHT VERTEBRAL ARTERY: Antegrade flow with normal waveform and velocity. LEFT CAROTID ARTERY: Moderate amount of partially calcified plaque is present at the level of the left carotid bulb and proximal internal carotid artery. Velocities correspond to an estimated less than 50% left ICA stenosis. LEFT VERTEBRAL ARTERY: Antegrade flow with normal waveform and velocity. IMPRESSION: Moderate calcified plaque at the level of both carotid bulbs and proximal internal carotid arteries. Plaque burden slightly more prominent on the right compared to the  left by ultrasound. Bilateral ICA stenoses are estimated at less than 50% based on velocity criteria. Based on grayscale imaging, the right ICA stenosis is  likely closer to the 50% range. Electronically Signed   By: Irish Lack M.D.   On: 06/01/2015 09:11   Dg Chest Port 1 View  06/13/2015  CLINICAL DATA:  Shortness of breath EXAM: PORTABLE CHEST 1 VIEW COMPARISON:  06/11/2015 FINDINGS: Heart size is normal. No pleural effusion identified. Chronic appearing coarsened interstitial markings are again noted bilaterally. Aeration to the right base has improved from previous exam. There is a new left base opacity which is indeterminate. IMPRESSION: 1. Improved aeration to the right lung base with new left base opacity. Electronically Signed   By: Signa Kell M.D.   On: 06/13/2015 11:55   Dg Chest Portable 1 View  06/11/2015  CLINICAL DATA:  Worsening shortness of breath. History of hypertension. 06/06/1959 EXAM: PORTABLE CHEST 1 VIEW COMPARISON:  None. FINDINGS: Streaky opacity in the right lung base is mildly increased. Other areas of bronchial wall thickening interstitial prominence are stable. Lungs are mildly hyperexpanded. No pleural effusion or pneumothorax. No evidence of pulmonary edema. Cardiac silhouette is normal in size and configuration. No mediastinal or hilar masses or convincing adenopathy. Bony thorax is demineralized but grossly intact. IMPRESSION: Mild increase in opacity at the right lung base. The apparent difference from the prior exam may be technical due to differences in patient positioning and lung volume. However, given the worsening symptoms, a developing bronchopneumonia should be considered. No evidence of pulmonary edema. No other change. Electronically Signed   By: Amie Portland M.D.   On: 06/11/2015 10:23   Dg Chest Port 1 View  06/06/2015  CLINICAL DATA:  Short of breath.  Wheezing.  History of COPD. EXAM: PORTABLE CHEST 1 VIEW COMPARISON:  06/05/2015 FINDINGS: Cardiac  silhouette is normal in size and configuration. No mediastinal or hilar masses or evidence of adenopathy. Prominent interstitial markings most evident at the lung bases is similar to the prior exam. There are no areas of lung consolidation. No pleural effusion or pneumothorax. Bony thorax is demineralized but intact. IMPRESSION: 1. Prominent interstitial markings mostly in the lung bases. This is most likely due to chronic bronchitic change. There is no evidence of pneumonia or pulmonary edema. No change from the recent prior study. Electronically Signed   By: Amie Portland M.D.   On: 06/06/2015 13:18   Dg Chest Port 1 View  06/05/2015  CLINICAL DATA:  Shortness of breath EXAM: PORTABLE CHEST 1 VIEW COMPARISON:  05/31/2015 FINDINGS: Hyperinflated lungs with emphysematous and bronchitic changes. Markings are diffusely even more prominent than prior. No focal consolidation, effusion, or pneumothorax. Normal heart size and stable mediastinal contours when allowing for rightward rotation. No acute osseous findings. IMPRESSION: 1. Interstitial coarsening above baseline which could be acute bronchitic or congestive. 2. COPD. Electronically Signed   By: Marnee Spring M.D.   On: 06/05/2015 05:43   Dg Swallowing Func-speech Pathology  06/13/2015  Objective Swallowing Evaluation:   Patient Details Name: KAYTLYNN KOCHAN MRN: [id number] Date of Birth: 1946/03/08 Today's Date: 06/13/2015 Time: SLP Start Time (ACUTE ONLY): 1045-SLP Stop Time (ACUTE ONLY): 1110 SLP Time Calculation (min) (ACUTE ONLY): 25 min Past Medical History: Past Medical History Diagnosis Date . Diabetes mellitus without complication (HCC)  . Hypertension  . Tobacco abuse  . Use of cane as ambulatory aid  . Leg edema, right    chronic Past Surgical History: Past Surgical History Procedure Laterality Date . Cataract extraction   . Hip surgery   . Foot surgery   HPI: 69 year old woman with PMH: dysphagia, MBS (  06/08/15) recommended Dys 1, nectar thick  liquids, DM, HTN, COPD, tobacco abuse just discharged from the hospital yesterday after suffering an acute ischemic admitted with SOB. Chest x-ray shows a developing infiltrate in the right lung base. No pna prior to [facility] 11/16. 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 Pt's swallow is characterized by generalized weakness, reduced movement and sensation which results in delayed swallow initiation and decreased anterior laryngeal elevation causing reduced airway protection. Pt had unsensed aspiration of thin and unsensed frank penetration of nectar, unimproved by weak cough/ throat clear. Pt had no penetration/aspiration events of honey and puree. Due to reduced functional reserve (COPD, sob, and aspiration pneumonia) SLP recommends conservative diet of honey thick liquids, puree, and pills whole in puree. Pt's swallow function will likely improve as overall health improves, however some residual dysphagia will likely persist due to neurogenic nature of dysphagia complicated by decreased respiratory status. SLP will f/u with check for diet tolerance and trials of upgrade textures when appropriate.   CHL IP TREATMENT RECOMMENDATION 06/13/2015 Treatment Recommendations Therapy as outlined in treatment plan below   CHL IP DIET RECOMMENDATION 06/13/2015 SLP Diet Recommendations Dysphagia 1 (Puree) solids;Honey thick liquids Liquid Administration via Cup Medication Administration Whole meds with puree Compensations Slow rate;Small sips/bites;Multiple dry swallows after each bite/sip;Minimize environmental distractions;Follow solids with liquid Postural Changes Seated upright at 90 degrees   CHL IP OTHER RECOMMENDATIONS 06/13/2015 Recommended  Consults -- Oral Care Recommendations Oral care BID Other Recommendations Order thickener from pharmacy   CHL IP FOLLOW UP RECOMMENDATIONS 06/13/2015 Follow up Recommendations Skilled Nursing facility   [facility] IP FREQUENCY AND DURATION 06/13/2015 Speech Therapy Frequency (ACUTE ONLY) min 2x/week Treatment Duration 2 weeks      CHL IP ORAL PHASE 06/13/2015 Oral Phase Impaired Oral - Pudding Teaspoon -- Oral - Pudding Cup -- Oral - Honey Teaspoon NT Oral - Honey Cup Piecemeal swallowing Oral - Nectar Teaspoon NT Oral - Nectar Cup Left anterior bolus loss;Lingual/palatal residue;Piecemeal swallowing Oral - Nectar Straw NT Oral - Thin Teaspoon NT Oral - Thin Cup Left anterior bolus loss;Lingual/palatal residue;Piecemeal swallowing Oral - Thin Straw NT Oral - Puree Lingual/palatal residue;Piecemeal swallowing;Decreased bolus cohesion Oral - Mech Soft Impaired mastication;Other (Comment);Piecemeal swallowing;Decreased bolus cohesion Oral - Regular -- Oral - Multi-Consistency -- Oral - Pill WFL Oral Phase - Comment --  CHL IP PHARYNGEAL PHASE 06/13/2015 Pharyngeal Phase Thin;Nectar;Honey;Solids Pharyngeal- Pudding Teaspoon -- Pharyngeal -- Pharyngeal- Pudding Cup -- Pharyngeal -- Pharyngeal- Honey Teaspoon NT Pharyngeal -- Pharyngeal- Honey Cup Delayed swallow initiation-vallecula;Reduced tongue base retraction Pharyngeal -- Pharyngeal- Nectar Teaspoon NT Pharyngeal -- Pharyngeal- Nectar Cup Penetration/Aspiration before swallow;Penetration/Aspiration during swallow;Trace aspiration;Delayed swallow initiation-pyriform sinuses;Reduced anterior laryngeal mobility;Reduced laryngeal elevation;Reduced airway/laryngeal closure;Reduced tongue base retraction Pharyngeal Material enters airway, CONTACTS cords and not ejected out Pharyngeal- Nectar Straw NT Pharyngeal -- Pharyngeal- Thin Teaspoon NT Pharyngeal -- Pharyngeal- Thin Cup Delayed swallow initiation-pyriform sinuses;Reduced anterior laryngeal mobility;Reduced laryngeal  elevation;Reduced epiglottic inversion;Reduced airway/laryngeal closure;Penetration/Aspiration before swallow;Penetration/Aspiration during swallow;Moderate aspiration Pharyngeal Material enters airway, passes BELOW cords without attempt by patient to eject out (silent aspiration) Pharyngeal- Thin Straw NT Pharyngeal -- Pharyngeal- Puree Delayed swallow initiation-vallecula;Reduced laryngeal elevation;Reduced anterior laryngeal mobility Pharyngeal -- Pharyngeal- Mechanical Soft Delayed swallow initiation-vallecula;Reduced laryngeal elevation;Reduced anterior laryngeal mobility Pharyngeal -- Pharyngeal- Regular NT Pharyngeal -- Pharyngeal- Multi-consistency NT Pharyngeal -- Pharyngeal- Pill  Delayed swallow initiation-vallecula Pharyngeal -- Pharyngeal Comment --  CHL IP CERVICAL ESOPHAGEAL PHASE 06/13/2015 Cervical Esophageal Phase WFL Pudding Teaspoon -- Pudding Cup -- Honey Teaspoon -- Honey Cup -- Nectar Teaspoon -- Nectar Cup -- Nectar Straw -- Thin Teaspoon -- Thin Cup -- Thin Straw -- Puree -- Mechanical Soft -- Regular -- Multi-consistency -- Pill -- Cervical Esophageal Comment -- Completed by Riccardo Dubin, SLP Student Harlon Ditty, MA CCC-SLP [phone] [place], Riley Nearing 06/13/2015, 1:56 PM              Dg Swallowing Func-speech Pathology  06/08/2015  Objective Swallowing Evaluation:   Patient Details Name: KAMBREY HAGGER MRN: [id number] Date of Birth: May 27, 1946 Today's Date: 06/08/2015 Time: SLP Start Time (ACUTE ONLY): 1546-SLP Stop Time (ACUTE ONLY): 1606 SLP Time Calculation (min) (ACUTE ONLY): 20 min Past Medical History: Past Medical History Diagnosis Date . Diabetes mellitus without complication (HCC)  . Hypertension  . Tobacco abuse  . Use of cane as ambulatory aid  . Leg edema, right    chronic Past Surgical History: Past Surgical History Procedure Laterality Date . Cataract extraction   . Hip surgery   . Foot surgery   HPI: Pt is a 69 y.o. female with PMH of COPD, hypertension,  hyperlipidemia, diabetes mellitus, tobacco abuse, COPD, who presented with left-sided weakness. 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 Pt was initially evaluated by SLP on 06-01-15 subsequentely without associated swallow deficits. Since then pt with recurrent onset of stroke symptoms and exhibited notable left sided weakness, dysarthria, and dysphagia. CT of head on 06-04-15 revealed acute, nonhemorrhagic infarct in the right putamen and corona radiata.  No Data Recorded Assessment / Plan / Recommendation CHL IP CLINICAL IMPRESSIONS 06/08/2015 Therapy Diagnosis Moderate oral phase dysphagia;Mild pharyngeal phase dysphagia Clinical Impression Pt presents with sensory motor moderate oral and mild pharyngeal dysphagia of neurogenic etiology. Weak lingual manipulation, left sided sulci pocketting, and reduced timely AP transfer resulted in premature spillage and delay in swallow initiation across all consistencies. Pt with consistent deep penetration of thin liquids via teaspoon, cup, and straw to the level of the true vocal cords with one suspected episode of aspiration which was sensed with overt reflexive cough by the patient. (Initial presentation of thin via teaspoon with limited visibility). Aspiration noted with barium tablet and thin liquids via cup, which was sensed with overt coughing but not effectively cleared. Penetration of thin liquids also evidenced after the swallow secondary to oral residuals spilling to valleculae, and into larygneal vestibule. Nectar thick liquid trials yielded flash penetration and no observable aspiration. Pt requests puree diet given oral weakness and fatigue with mechanical soft and chopped consistencies. Recommend conservative diet implementation of nectar thick liquids and dysphagia 1 (puree) consistencies given history of decreased respiratory support and previous clinically observed increased signs and  symptoms of reduced airway protection with prolonged PO trials and meals. Treating SLP at next level of care to upgrade as clinically appropriate.       CHL IP TREATMENT RECOMMENDATION 06/08/2015 Treatment Recommendations Defer treatment plan to f/u with SLP   CHL IP DIET RECOMMENDATION 06/08/2015 SLP Diet Recommendations Dysphagia 1 (Puree) solids;Nectar thick liquid Liquid Administration via Cup;Straw Medication Administration Whole meds with puree Compensations Slow rate;Small sips/bites;Multiple dry swallows after each bite/sip;Minimize environmental distractions;Lingual sweep for clearance of pocketing;Follow solids with liquid Postural Changes Seated upright at 90 degrees;Remain semi-upright after after feeds/meals (Comment)   CHL IP OTHER  RECOMMENDATIONS 06/08/2015 Recommended Consults -- Oral Care Recommendations Oral care BID Other Recommendations Order thickener from pharmacy   CHL IP FOLLOW UP RECOMMENDATIONS 06/08/2015 Follow up Recommendations Skilled Nursing facility   [facility] IP FREQUENCY AND DURATION 06/01/2015 Speech Therapy Frequency (ACUTE ONLY) min 2x/week Treatment Duration 1 week      CHL IP ORAL PHASE 06/08/2015 Oral Phase Impaired Oral - Pudding Teaspoon -- Oral - Pudding Cup -- Oral - Honey Teaspoon -- Oral - Honey Cup -- Oral - Nectar Teaspoon Weak lingual manipulation;Reduced posterior propulsion;Left pocketing in lateral sulci;Delayed oral transit;Premature spillage Oral - Nectar Cup Weak lingual manipulation;Left pocketing in lateral sulci;Reduced posterior propulsion;Delayed oral transit;Premature spillage Oral - Nectar Straw Weak lingual manipulation;Reduced posterior propulsion;Left pocketing in lateral sulci;Delayed oral transit;Premature spillage Oral - Thin Teaspoon Weak lingual manipulation;Delayed oral transit;Reduced posterior propulsion;Left pocketing in lateral sulci;Lingual/palatal residue;Premature spillage Oral - Thin Cup Weak lingual manipulation;Reduced posterior  propulsion;Left pocketing in lateral sulci;Premature spillage;Delayed oral transit Oral - Thin Straw Weak lingual manipulation;Premature spillage;Delayed oral transit;Left pocketing in lateral sulci;Reduced posterior propulsion Oral - Puree Weak lingual manipulation;Lingual pumping;Delayed oral transit;Reduced posterior propulsion;Premature spillage Oral - Mech Soft Weak lingual manipulation;Left pocketing in lateral sulci;Piecemeal swallowing;Delayed oral transit;Decreased bolus cohesion;Premature spillage;Impaired mastication Oral - Regular -- Oral - Multi-Consistency -- Oral - Pill Delayed oral transit;Premature spillage;Weak lingual manipulation;Piecemeal swallowing;Left pocketing in lateral sulci;Reduced posterior propulsion Oral Phase - Comment --  CHL IP PHARYNGEAL PHASE 06/08/2015 Pharyngeal Phase Thin;Nectar Pharyngeal- Pudding Teaspoon -- Pharyngeal -- Pharyngeal- Pudding Cup -- Pharyngeal -- Pharyngeal- Honey Teaspoon -- Pharyngeal -- Pharyngeal- Honey Cup -- Pharyngeal -- Pharyngeal- Nectar Teaspoon Delayed swallow initiation-vallecula;Penetration/Aspiration during swallow Pharyngeal Material enters airway, remains ABOVE vocal cords and not ejected out Pharyngeal- Nectar Cup Delayed swallow initiation-vallecula;Penetration/Aspiration during swallow Pharyngeal Material enters airway, CONTACTS cords and then ejected out Pharyngeal- Nectar Straw Delayed swallow initiation-pyriform sinuses Pharyngeal -- Pharyngeal- Thin Teaspoon Penetration/Aspiration during swallow;Delayed swallow initiation-vallecula;Trace aspiration Pharyngeal Material enters airway, passes BELOW cords then ejected out Pharyngeal- Thin Cup Penetration/Aspiration during swallow;Penetration/Apiration after swallow;Delayed swallow initiation-vallecula;Pharyngeal residue - valleculae Pharyngeal Material enters airway, CONTACTS cords and not ejected out Pharyngeal- Thin Straw Penetration/Aspiration during swallow;Delayed swallow  initiation-vallecula Pharyngeal Material enters airway, CONTACTS cords and not ejected out Pharyngeal- Puree Delayed swallow initiation-vallecula Pharyngeal -- Pharyngeal- Mechanical Soft Delayed swallow initiation-vallecula Pharyngeal -- Pharyngeal- Regular -- Pharyngeal -- Pharyngeal- Multi-consistency -- Pharyngeal -- Pharyngeal- Pill Penetration/Aspiration during swallow;Delayed swallow initiation-vallecula Pharyngeal Material enters airway, passes BELOW cords and not ejected out despite cough attempt by patient Pharyngeal Comment --  No flowsheet data found. Marcene Duos MA, CCC-SLP Acute Care Speech Language Pathologist  Kennieth Rad 06/08/2015, 4:46 PM               Microbiology: Recent Results (from the past 240 hour(s))  Urine culture     Status: None   Collection Time: 06/11/15  6:19 PM  Result Value Ref Range Status   Specimen Description URINE, RANDOM  Final   Special Requests Normal  Final   Culture >=100,000 COLONIES/mL ESCHERICHIA COLI  Final   Report Status 06/14/2015 FINAL  Final   Organism ID, Bacteria ESCHERICHIA COLI  Final      Susceptibility   Escherichia coli - MIC*    AMPICILLIN <=2 SENSITIVE Sensitive     CEFAZOLIN <=4 SENSITIVE Sensitive     CEFTRIAXONE <=1 SENSITIVE Sensitive     CIPROFLOXACIN <=0.25 SENSITIVE Sensitive     GENTAMICIN <=1 SENSITIVE Sensitive     IMIPENEM <=0.25 SENSITIVE Sensitive  NITROFURANTOIN <=16 SENSITIVE Sensitive     TRIMETH/SULFA <=20 SENSITIVE Sensitive     AMPICILLIN/SULBACTAM <=2 SENSITIVE Sensitive     PIP/TAZO <=4 SENSITIVE Sensitive     * >=100,000 COLONIES/mL ESCHERICHIA COLI     Labs: CBC:  Recent Labs Lab 06/09/15 0620 06/11/15 0953 06/11/15 1707 06/12/15 0430 06/13/15 0319 06/14/15 0434  WBC 19.5* 28.1* 24.9* 24.4* 22.2* 18.9*  NEUTROABS 17.5* 24.3*  --   --  19.4* 15.7*  HGB 13.5 12.6 11.2* 10.9* 9.6* 9.5*  HCT 39.5 37.5 33.3* 33.2* 29.3* 28.8*  MCV 78.1 79.6 79.7 79.6 79.8 80.2  PLT 239 191 189 200  194 219   Basic Metabolic Panel:  Recent Labs Lab 06/10/15 0648 06/11/15 0953 06/11/15 1707 06/12/15 0430 06/13/15 0319 06/14/15 0434  NA 136 133*  --  138 139 139  K 3.9 3.2*  --  4.5 4.1 3.4*  CL 96* 92*  --  96* 101 100*  CO2 32 34*  --  35* 28 32  GLUCOSE 178* 162*  --  132* 110* 110*  BUN 53* 33*  --  27* 21* 18  CREATININE 0.84 0.64 0.69 0.69 0.55 0.59  CALCIUM 8.2* 8.4*  --  8.3* 8.1* 8.1*  MG  --   --  2.3  --  2.2 2.0   Liver Function Tests:  Recent Labs Lab 06/12/15 0430 06/13/15 0319  AST 28 25  ALT 36 31  ALKPHOS 79 72  BILITOT 0.9 1.1  PROT 5.5* 5.2*  ALBUMIN 2.6* 2.4*   No results for input(s): LIPASE, AMYLASE in the last 168 hours. No results for input(s): AMMONIA in the last 168 hours.  Cardiac Enzymes:  Recent Labs Lab 06/11/15 0953 06/11/15 1707 06/11/15 2236 06/12/15 0430  TROPONINI 0.20* 0.17* 0.15* 0.15*   BNP (last 3 results)  Recent Labs  06/01/15 0618 06/11/15 0953  BNP 203.0* 193.0*    ProBNP (last 3 results) No results for input(s): PROBNP in the last 8760 hours.  CBG:  Recent Labs Lab 06/13/15 0520 06/13/15 1130 06/13/15 2224 06/14/15 0731 06/14/15 1144  GLUCAP 124* 163* 135* 103* 151*    Time spent: 30 minutes  Signed:  Naod Sweetland  [facility] 06/14/2015, 12:56 PM

## 2015-06-14 NOTE — Progress Notes (Signed)
STROKE TEAM PROGRESS NOTE   SUBJECTIVE (INTERVAL HISTORY) No family members present. RN reports improved respiratory status. Passed swallow to dysphagia 1 diet with honey thick liquid. Still not able to lie flat at this time and left hemiplegia. Sitting in chair having lunch during rounds.    OBJECTIVE Temp:  [98.1 F (36.7 C)-98.6 F (37 C)] 98.6 F (37 C) (11/22 0500) Pulse Rate:  [69-102] 95 (11/22 0500) Cardiac Rhythm:  [-] Normal sinus rhythm (11/22 0700) Resp:  [18-28] 18 (11/22 0500) BP: (131-158)/(50-74) 131/74 mmHg (11/22 0500) SpO2:  [96 %-98 %] 96 % (11/22 0849) Weight:  [69.673 kg (153 lb 9.6 oz)] 69.673 kg (153 lb 9.6 oz) (11/22 0500)  CBC:   Recent Labs Lab 06/13/15 0319 06/14/15 0434  WBC 22.2* 18.9*  NEUTROABS 19.4* 15.7*  HGB 9.6* 9.5*  HCT 29.3* 28.8*  MCV 79.8 80.2  PLT 194 219    Basic Metabolic Panel:   Recent Labs Lab 06/13/15 0319 06/14/15 0434  NA 139 139  K 4.1 3.4*  CL 101 100*  CO2 28 32  GLUCOSE 110* 110*  BUN 21* 18  CREATININE 0.55 0.59  CALCIUM 8.1* 8.1*  MG 2.2 2.0    Lipid Panel:     Component Value Date/Time   CHOL 155 06/01/2015 0618   TRIG 67 06/01/2015 0618   HDL 29* 06/01/2015 0618   CHOLHDL 5.3 06/01/2015 0618   VLDL 13 06/01/2015 0618   LDLCALC 113* 06/01/2015 0618   HgbA1c:  Lab Results  Component Value Date   HGBA1C 6.3* 06/11/2015   Urine Drug Screen:     Component Value Date/Time   LABOPIA NONE DETECTED 05/31/2015 2020   COCAINSCRNUR NONE DETECTED 05/31/2015 2020   LABBENZ NONE DETECTED 05/31/2015 2020   AMPHETMU NONE DETECTED 05/31/2015 2020   THCU POSITIVE* 05/31/2015 2020   LABBARB NONE DETECTED 05/31/2015 2020      IMAGING I have personally reviewed the radiological images below and agree with the radiology interpretations.  Dg Chest Portable 1 View 06/11/2015   Mild increase in opacity at the right lung base. The apparent difference from the prior exam may be technical due to differences  in patient positioning and lung volume. However, given the worsening symptoms, a developing bronchopneumonia should be considered. No evidence of pulmonary edema. No other change.   CT of head without contrast  06/12/2015 Enlarging subacute infarct in the right posterior basal ganglia. Negative for hemorrhage.  CTA head and neck 06/02/15   1. Bilateral cervical carotid atherosclerosis greater on the right. 60% stenosis of the right ICA origin. 2. Extensive calcified plaque in both ICA siphons with moderate bilateral supraclinoid segment stenosis. 3. Calcified plaque causes moderate to severe stenosis at the origin of the dominant right vertebral artery. Non dominant left vertebral artery arises directly from the arch and functionally terminates in PICA. 4. Stable circle of Willis branches from the recent MRA including moderate left PCA stenosis. 5. Stable CT appearance of the brain from earlier today. 6. Pulmonary emphysema.  MRI 05/31/15  1. 5 mm acute ischemic nonhemorrhagic infarct within the left centrum semi ovale. No significant mass effect. 2. Remote lacunar infarcts involving the left lentiform nucleus and right thalamus. 3. Age-related cerebral atrophy with moderate chronic microvascular ischemic disease.  MRA 06/01/15 1. No evidence of medium or large vessel intracranial occlusion or flow-limiting proximal stenosis. 2. Mild left supraclinoid ICA and proximal left ACA stenoses. 3. Anterior and posterior circulation branch vessel irregularity and attenuation suggestive of atherosclerosis.  CUS 06/01/15 - Moderate calcified plaque at the level of both carotid bulbs and proximal internal carotid arteries. Plaque burden slightly more prominent on the right compared to the left by ultrasound. Bilateral ICA stenoses are estimated at less than 50% based on velocity criteria. Based on grayscale imaging, the right ICA stenosis is likely closer to the 50% range.  2D echo  06/02/15 -  Normal LV wall thickness with LVEF 25-30% and wall motionabnormaltiies as outlined above. 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 There is stranding andtrabeculation at the LV apex - no clearly formed mural thrombus,but evidence of relative stasis by microbubble contrast. Thiscould be a potential source of embolic stroke. Aortic valve notwell seen, appears sclerotic. Trivial mitral and tricuspidregurgitation. Cannot exclude PFO.  06/12/15 - Left ventricle: The cavity size was normal. Systolic function wasvigorous. The estimated ejection fraction was in the range of 70%to 75%. Wall motion was normal; there were no regional wallmotion abnormalities. Doppler parameters are consistent withabnormal left ventricular relaxation (grade 1 diastolicdysfunction). - Mitral valve: Moderately calcified annulus.  Bilateral lower extremity venous duplex  no obvious evidence of DVT or SVT noted in the visualized veins of the bilateral lower extremities.    PHYSICAL EXAM General - Well nourished, well developed, mild respiratory distress.  Ophthalmologic - Fundi not visualized due to noncooperation.  Cardiovascular - Regular rate and rhythm.  Mental Status -  Level of arousal and orientation to time, place, and person were intact. Language including expression, naming, repetition, comprehension was assessed and found intact. Fund of Knowledge was assessed and was intact.  Cranial Nerves II - XII - II - Visual field intact OU. III, IV, VI - Extraocular movements intact. V - Facial sensation intact bilaterally. VII - left facial droop. VIII - Hearing & vestibular intact bilaterally. X - Palate elevates symmetrically. XI - Chin turning & shoulder shrug intact bilaterally. XII - Tongue protrusion intact.  Motor Strength - The patient's strength was 0/5  LUE and 2-/5 LLE, but 5/5 RUE and RLE.  Bulk was normal and fasciculations were absent.   Motor Tone - Muscle tone was assessed at the neck and appendages and was decreased on the left.  Reflexes - The patient's reflexes were 1+ in all extremities and she had no pathological reflexes.  Sensory - Light touch, temperature/pinprick were assessed and were symmetrical.    Coordination - The patient had normal movements in the right hand with no ataxia or dysmetria.  Tremor was absent.  Gait and Station - not able to test due to weakness.   ASSESSMENT/PLAN Ms. [name] is a 69 y.o. female with history of DM, HTN, tobacco use, low EF and questionable LV thrombus on previous echo, treated for a stroke in 05/31/2015 in [facility], presenting with pneumonia and possible aspiration. She did not receive IV t-PA due to no new deficits.  Stroke:  Right posterior basal ganglia infarct, embolic pattern, secondary to unknown source. Asymptomatic acute small left CR infarct on MRI.   Resultant  Left hemiplegia  MRI  Small left centrum semi ovale infarct  Repeat CT head R moderate sized basal ganglia infarct  CTA head and neck - bilateral ICA siphone and diffuse intracranial stenosis  MRA  06/01/2015 - bilateral ICA siphone stenosis  Carotid Doppler - 06/01/2015 - bilateral ICA stenosis 50% or less.  LE venous dopplers negative for DVT  2D Echo EF 70-75% and no LV thrombus  Difficulty to perform TEE due to respiratory issue and overnight junctional rhythm, will recommend 30 day cardiac event monitoring as outpt to rule out afib. Continue tele monitoring while inpt.   LDL - 113  HgbA1c 6.0  VTE prophylaxis - Lovenox DIET - DYS 1 Room service appropriate?: Yes; Fluid consistency:: Honey Thick  aspirin 81 mg daily and clopidogrel 75 mg daily prior to admission, now on aspirin 81 mg daily and clopidogrel 75 mg daily. Continue dual antiplatelet for 3 months.  Patient counseled to be  compliant with her antithrombotic medications  Ongoing aggressive stroke risk factor management  Therapy recommendations: Pending  Disposition: Pending (at [facility])  Aspiration pneumonia  CXR concerning for aspiration  Repeat CXR improved aeration to the right lung base, however, there is new left base opacity  On Unasyn   Not able to lie flat so that MRI cancelled.  Hypertension  Improved but still intermittently low   D/c isordil   D/c clonidine and lisinopril  Continue lasix  Due to diffuse intracranial stenosis, BP goal 130-150  Hyperlipidemia  Home meds:  Pravachol 40 mg daily resumed in hospital  LDL 113, goal < 70  Increase pravastatin to  daily  Continue statin at discharge  Diabetes  HgbA1c 6.0, goal < 7.0  Controlled  SSI  Tobacco abuse  Current smoker  Smoking cessation counseling provided  Pt is willing to quit  Other Stroke Risk Factors  Advanced age  Marijuana use  Other Active Problems  Anemia  Probable dehydration with elevated BUN  Dysphagia  Hospital day # 3   Neurology will sign off. Please call with questions. Pt will follow up with Dr. Roda Shutters at [facility] in about 2 months. Thanks for the consult.   Marvel Plan, MD PhD Stroke Neurology 06/14/2015 10:35 AM   To contact Stroke Continuity provider, please refer to [email or web address]. After hours, contact General Neurology

## 2015-06-15 ENCOUNTER — Encounter (HOSPITAL_COMMUNITY)
Admission: RE | Admit: 2015-06-15 | Discharge: 2015-06-15 | Disposition: A | Discharge status: Home / Self Care | Payer: Medicare Other | Source: Skilled Nursing Facility | Attending: Internal Medicine | Admitting: Internal Medicine

## 2015-06-15 ENCOUNTER — Encounter: Payer: Self-pay | Admitting: Internal Medicine

## 2015-06-15 ENCOUNTER — Non-Acute Institutional Stay (SKILLED_NURSING_FACILITY): Payer: Medicare Other | Admitting: Internal Medicine

## 2015-06-15 DIAGNOSIS — I5022 Chronic systolic (congestive) heart failure: Secondary | ICD-10-CM

## 2015-06-15 DIAGNOSIS — E876 Hypokalemia: Secondary | ICD-10-CM | POA: Diagnosis not present

## 2015-06-15 DIAGNOSIS — J690 Pneumonitis due to inhalation of food and vomit: Secondary | ICD-10-CM

## 2015-06-15 DIAGNOSIS — E119 Type 2 diabetes mellitus without complications: Secondary | ICD-10-CM | POA: Diagnosis not present

## 2015-06-15 LAB — BASIC METABOLIC PANEL
ANION GAP: 8 (ref 5–15)
BUN: 17 mg/dL (ref 6–20)
CO2: 28 mmol/L (ref 22–32)
Calcium: 7.9 mg/dL — ABNORMAL LOW (ref 8.9–10.3)
Chloride: 100 mmol/L — ABNORMAL LOW (ref 101–111)
Creatinine, Ser: 0.49 mg/dL (ref 0.44–1.00)
GFR calc non Af Amer: >60 mL/min (ref >60)
Glucose, Bld: 129 mg/dL — ABNORMAL HIGH (ref 65–99)
POTASSIUM: 3.4 mmol/L — AB (ref 3.5–5.1)
SODIUM: 136 mmol/L (ref 135–145)

## 2015-06-15 LAB — CBC WITH DIFFERENTIAL/PLATELET
BASOS PCT: 0 %
Basophils Absolute: 0 10*3/uL (ref 0.0–0.1)
EOS ABS: 0.1 10*3/uL (ref 0.0–0.7)
EOS PCT: 0 %
HCT: 29.8 % — ABNORMAL LOW (ref 36.0–46.0)
HEMOGLOBIN: 9.9 g/dL — AB (ref 12.0–15.0)
LYMPHS ABS: 2.1 10*3/uL (ref 0.7–4.0)
Lymphocytes Relative: 11 %
MCH: 27.1 pg (ref 26.0–34.0)
MCHC: 33.2 g/dL (ref 30.0–36.0)
MCV: 81.6 fL (ref 78.0–100.0)
MONO ABS: 1.2 10*3/uL — AB (ref 0.1–1.0)
MONOS PCT: 6 %
Neutro Abs: 16.3 10*3/uL — ABNORMAL HIGH (ref 1.7–7.7)
Neutrophils Relative %: 83 %
PLATELETS: 247 10*3/uL (ref 150–400)
RBC: 3.65 MIL/uL — ABNORMAL LOW (ref 3.87–5.11)
RDW: 14.2 % (ref 11.5–15.5)
WBC: 19.7 10*3/uL — ABNORMAL HIGH (ref 4.0–10.5)

## 2015-06-15 NOTE — Progress Notes (Signed)
Patient ID: Tara Park, female   DOB: 09-09-1945, 69 y.o.   MRN: [id number]   Acute visit status post hospitalization for aspiration pneumonia with history of dysphagia and recent CVA.  History of present illness.  Patient is a very medically complex 69 year old female with a history of COPD on chronic oxygen diabetes and recent acute ischemic CVA.  She initially came the facility for short period but had acute shortness of breath and return to the hospital-.  Chest x-ray did show a developing infiltrate in the right lung base lab work showed hypokalemia with potassium of 3.2 her white count was up to 28,100.  Prior echocardiogram actually did show a questionable obesity at the apical packs of the heart concerning for thrombus.  However there was a repeat echo done that did not show any evidence of a thrombus and ejection fraction actually showed improvement from 25-30 percent up to 70-75 percent with diastolic dysfunction.  Regards to aspiration pneumonia-she was started on Unasyn and clinically she improved.  Urine culture also grew out Escherichia coli blood cultures were negative she has been discharged on Augmentin for 14 day course.  She is also on a prednisone taper.  Speech therapy did recommend a dysphagia type I diet.  In regards to her CVA neurology was consulted patient had repeat CT of the head that showed a right basal ganglia infarct previous CT scan and MRI showed infarct on the left side.  It was decided patient will get a 30 day monitor as an outpatient as well as status post consultation with cardiology as well as neurology.  Recommendation was to continue aspirin and Plavix as anticoagulation.  Her blood pressure medicines were discontinued her goal blood pressure is 130-150 systolic--neurology recommended permissive hypertension due to her progressive stroke and intracranial stenosis.  Currently patient appears to be relatively stable although very weak she is  concerned about her rehabilitation prognosis and this will need follow-up by neurology as well as cardiology.  Previous medical history  Aspiration pneumonia.  UTI.  Diabetes.  Tobacco abuse.  Hypertension.  Acute ischemic stroke.  Demand ischemia.  Hypokalemia.  Chronic systolic CHF.  Murrell thrombus of the cardiac apex.  COPD.  Chronic hypoxic respiratory failure.  Previous surgical history.  History of hip surgery-cataract extraction-foot surgery.  Social history-before hospitalization she was a current every day smoker.  No history of significant alcohol smokeless tobacco use or illicit drug use.  Family history.  TIA mother.  Coronary artery disease brother.  History of COPD in her father.  .  Medications.  Augmentin 500 mg 3 times a day 7 days.  Mucinex 600 mg twice a day.  Nystatin 5 mL 4 times a day.  Senokot daily at bedtime when necessary.  Pravastatin 80 mg daily at bedtime.  Prednisone taper starting at 40 mg for 4 days-30 mg for 3 days-20 mg for 3 days-10 mg for 3 days-then stop.  Trazodone 50 mg daily at bedtime when necessary.  Albuterol nebulizer every 6 hours when necessary.  Aspirin enteric-coated 81 mg daily.  Plavix 75 mg daily.  Combivent inhaler 2 puffs into lungs 4 times a day.  Lasix 20 mg daily.  Vicodin 5-3 25 mg every 6 hours when necessary.  Glucophage 500 mg  Review of systems.  In general does not complain of fever chills says she feels quite weak however.  Skin does not complain of rashes or itching.  Head ears eyes nose mouth and throat-does not complain of sore throat  or nasal discharge is being treated for thrush does not complain of visual changes.  Respiratory does not complain currently of shortness of breath or cough she had severe COPD on chronic oxygen.  Cardiac does not complain of chest pain or palpitations has mild lower extremity edema.  GI does not complain of abdominal discomfort  nausea vomiting diarrhea or constipation  GU does not complain of dysuria currently.  Musculoskeletal has left-sided weakness but does not complain of joint pain.  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  Psych not complaining of anxiety or depression however she is experiencing insomnia   Physical exam.  Temperature 97.4 pulse 84 respirations 24 blood pressure 139/69.  General this is a pleasant elderly female in no distress lying in bed.  Her skin is warm and dry.  Eyes pupils appear to be reactive to light visual acuity appears grossly intact sclera and conjunctivae are clear.  Oropharynx thrush appears to be resolving mucous membranes are moist.  Chest--shallow air entry there is no labored breathing there are some bilateral crackles.  Abdomen is soft nontender with positive bowel sounds.  Heart is regular rate and rhythm without murmur gallop or rub she has mild bilateral lower extremity edema pedal pulses are intact.  Muscle skeletal continues with left-sided hemiparalysis and mouth droop strength preserved on the right it appears with positive grip strength is able to move her right leg-her speech is clear.  Neurologic as noted above left-sided hemiparalysis-right-sided strength appears to be preserved her speech is clear-does have a mouth droop.  Psych she is alert and oriented pleasant and appropriate.  Labs.  06/15/2015.  Sodium 136 potassium 3.4 BUN 17 creatinine 0.49.  WBC 19.7 hemoglobin 9.9 platelets 247.  Assessment and plan.  #1-history of aspiration pneumonia she is completing course of Augmentin M-this appears to have stabilized although she is a very fragile individual-she also is on a prednisone taper as well as chronic oxygen-does have albuterol nebulizers as needed as well as Combivent routinely-at this point appears to be stable although again she is very fragile  #2  UTI-this again is being treated with Augmentin with a history of Escherichia coli-at this point appears relatively asymptomatic.  #3 history of CVA-again she will need neurology follow-up she is on Lasix as well as aspirin recommendation for permissive hypertension she is no longer on her clonidine Imdur or lisinopril-at this point will monitor.  #4 history of diabetes type 2 she is on Glucophage blood sugars at this point appears to be stable largely in the lower to mid 100s it was 106 this morning 162 last night.  .  #5 mild hypokalemia will supplement this with 20 mEq potassium for 2 days and then 10 mEq a day and recheck a metabolic panel on Friday, November 25 as well as Monday, November 28.  #5 insomnia will start melatonin and monitor.--Apparently trazodone is not helping much  #6 history of thrush she is being treated with nystatin this appears to be resolving  #7 leukocytosis this is been chronic in the hospital I suspect being on prednisone has contributed to this this well-will update this as well with a CBC with differential on Friday, November 25 as well as first laboratory day next week.  Marland Kitchen  #8-history of systolic CHF-she is on Lasix again will update metabolic panel on Friday as well as next Monday-   [id number]-of note greater than 50 minutes spent assessing patient-reviewing her  chart-discussing her status with nursing staff-and coordinating and formulating a plan of care for numerous diagnoses-of note greater than 50% of time spent coordinating plan of care      .

## 2015-06-17 ENCOUNTER — Encounter (HOSPITAL_COMMUNITY)
Admission: AD | Admit: 2015-06-17 | Discharge: 2015-06-17 | Disposition: A | Discharge status: Home / Self Care | Payer: Medicare Other | Source: Skilled Nursing Facility | Attending: Internal Medicine | Admitting: Internal Medicine

## 2015-06-17 LAB — BASIC METABOLIC PANEL
Anion gap: 6 (ref 5–15)
BUN: 21 mg/dL — AB (ref 6–20)
CALCIUM: 8 mg/dL — AB (ref 8.9–10.3)
CO2: 27 mmol/L (ref 22–32)
Chloride: 106 mmol/L (ref 101–111)
Creatinine, Ser: 0.43 mg/dL — ABNORMAL LOW (ref 0.44–1.00)
GFR calc Af Amer: >60 mL/min (ref >60)
GLUCOSE: 108 mg/dL — AB (ref 65–99)
Potassium: 3.9 mmol/L (ref 3.5–5.1)
SODIUM: 139 mmol/L (ref 135–145)

## 2015-06-17 LAB — HEPATIC FUNCTION PANEL
ALT: 33 U/L (ref 14–54)
AST: 27 U/L (ref 15–41)
Albumin: 2.5 g/dL — ABNORMAL LOW (ref 3.5–5.0)
Alkaline Phosphatase: 81 U/L (ref 38–126)
BILIRUBIN DIRECT: 0.2 mg/dL (ref 0.1–0.5)
BILIRUBIN INDIRECT: 0.8 mg/dL (ref 0.3–0.9)
Total Bilirubin: 1 mg/dL (ref 0.3–1.2)
Total Protein: 5 g/dL — ABNORMAL LOW (ref 6.5–8.1)

## 2015-06-17 LAB — CBC WITH DIFFERENTIAL/PLATELET
BASOS ABS: 0 10*3/uL (ref 0.0–0.1)
BASOS PCT: 0 %
Eosinophils Absolute: 0 10*3/uL (ref 0.0–0.7)
Eosinophils Relative: 0 %
HEMATOCRIT: 27.6 % — AB (ref 36.0–46.0)
HEMOGLOBIN: 9.1 g/dL — AB (ref 12.0–15.0)
Lymphocytes Relative: 13 %
Lymphs Abs: 2.1 10*3/uL (ref 0.7–4.0)
MCH: 26.9 pg (ref 26.0–34.0)
MCHC: 33 g/dL (ref 30.0–36.0)
MCV: 81.7 fL (ref 78.0–100.0)
Monocytes Absolute: 0.6 10*3/uL (ref 0.1–1.0)
Monocytes Relative: 4 %
NEUTROS ABS: 13.2 10*3/uL — AB (ref 1.7–7.7)
NEUTROS PCT: 83 %
Platelets: 208 10*3/uL (ref 150–400)
RBC: 3.38 MIL/uL — AB (ref 3.87–5.11)
RDW: 15.3 % (ref 11.5–15.5)
WBC: 16 10*3/uL — AB (ref 4.0–10.5)

## 2015-06-17 LAB — DIGOXIN LEVEL: Digoxin Level: 0.6 ng/mL — ABNORMAL LOW (ref 0.8–2.0)

## 2015-06-20 ENCOUNTER — Encounter (HOSPITAL_COMMUNITY)
Admission: RE | Admit: 2015-06-20 | Discharge: 2015-06-20 | Disposition: A | Discharge status: Home / Self Care | Payer: Medicare Other | Source: Skilled Nursing Facility | Attending: Internal Medicine | Admitting: Internal Medicine

## 2015-06-20 ENCOUNTER — Ambulatory Visit (HOSPITAL_COMMUNITY)
Admission: RE | Admit: 2015-06-20 | Discharge: 2015-06-20 | Disposition: A | Discharge status: Home / Self Care | Payer: Medicare Other | Source: Ambulatory Visit | Attending: Internal Medicine | Admitting: Internal Medicine

## 2015-06-20 ENCOUNTER — Non-Acute Institutional Stay (SKILLED_NURSING_FACILITY): Payer: Medicare Other | Admitting: Internal Medicine

## 2015-06-20 DIAGNOSIS — I5181 Takotsubo syndrome: Secondary | ICD-10-CM

## 2015-06-20 DIAGNOSIS — R05 Cough: Secondary | ICD-10-CM | POA: Insufficient documentation

## 2015-06-20 DIAGNOSIS — R0602 Shortness of breath: Secondary | ICD-10-CM | POA: Insufficient documentation

## 2015-06-20 DIAGNOSIS — J690 Pneumonitis due to inhalation of food and vomit: Secondary | ICD-10-CM

## 2015-06-20 DIAGNOSIS — R0989 Other specified symptoms and signs involving the circulatory and respiratory systems: Secondary | ICD-10-CM | POA: Diagnosis not present

## 2015-06-20 DIAGNOSIS — I639 Cerebral infarction, unspecified: Secondary | ICD-10-CM

## 2015-06-20 DIAGNOSIS — J441 Chronic obstructive pulmonary disease with (acute) exacerbation: Secondary | ICD-10-CM

## 2015-06-20 DIAGNOSIS — G8194 Hemiplegia, unspecified affecting left nondominant side: Secondary | ICD-10-CM | POA: Diagnosis not present

## 2015-06-20 LAB — CBC WITH DIFFERENTIAL/PLATELET
BASOS ABS: 0 10*3/uL (ref 0.0–0.1)
Basophils Relative: 0 %
EOS PCT: 0 %
Eosinophils Absolute: 0 10*3/uL (ref 0.0–0.7)
HCT: 33.2 % — ABNORMAL LOW (ref 36.0–46.0)
Hemoglobin: 10.9 g/dL — ABNORMAL LOW (ref 12.0–15.0)
LYMPHS PCT: 8 %
Lymphs Abs: 1.4 10*3/uL (ref 0.7–4.0)
MCH: 27.4 pg (ref 26.0–34.0)
MCHC: 32.8 g/dL (ref 30.0–36.0)
MCV: 83.4 fL (ref 78.0–100.0)
MONO ABS: 0.7 10*3/uL (ref 0.1–1.0)
Monocytes Relative: 4 %
Neutro Abs: 16.6 10*3/uL — ABNORMAL HIGH (ref 1.7–7.7)
Neutrophils Relative %: 88 %
PLATELETS: 215 10*3/uL (ref 150–400)
RBC: 3.98 MIL/uL (ref 3.87–5.11)
RDW: 17.4 % — AB (ref 11.5–15.5)
WBC: 18.7 10*3/uL — ABNORMAL HIGH (ref 4.0–10.5)

## 2015-06-20 LAB — C DIFFICILE QUICK SCREEN W PCR REFLEX
C DIFFICILE (CDIFF) INTERP: NEGATIVE
C Diff antigen: NEGATIVE
C Diff toxin: NEGATIVE

## 2015-06-20 LAB — BASIC METABOLIC PANEL
Anion gap: 5 (ref 5–15)
BUN: 12 mg/dL (ref 6–20)
CALCIUM: 8.2 mg/dL — AB (ref 8.9–10.3)
CO2: 28 mmol/L (ref 22–32)
Chloride: 104 mmol/L (ref 101–111)
Creatinine, Ser: 0.43 mg/dL — ABNORMAL LOW (ref 0.44–1.00)
GFR calc Af Amer: >60 mL/min (ref >60)
GLUCOSE: 105 mg/dL — AB (ref 65–99)
POTASSIUM: 3.3 mmol/L — AB (ref 3.5–5.1)
Sodium: 137 mmol/L (ref 135–145)

## 2015-06-20 NOTE — Progress Notes (Signed)
Patient ID: Tara Park, female   DOB: 1946-06-10, 69 y.o.   MRN: [id number]  Facility; [facility] Chief complaint; admission to SNF post [facility] from 11/19 to 06/14/2015. Previously the patient was hospitalized from 11/8 through 11/18 and was here very temporarily but I did not see her, she returned to the hospital  History; this patient was originally sent here after an admit to hospital from 11/8. The discharge summary states an acute ischemic stroke although it doesn't really lateralize this very well. Apparently the original imaging studies suggested a left basal ganglial infarct however through her stay at the hospital and then subsequently after her brief stay here it was clear that the major issue was a right basal ganglial infarct and she developed dense left hemiparesis. The timeframe of all of this is really unclear. However she clearly has a left hemiparesis currently. Her most recent hospitalization was secondary to aspiration pneumonia. She was also felt to have a UTI. She was treated with Unasyn in the hospital and then transitioned to Augmentin. It is also clear that she had an exacerbation of her COPD during the first hospitalization and probably continuing into the second. She received nebulizers and tapering steroids. She is discharged on oxygen 4 L however the patient tells me that she was not on oxygen before all of this although I'm not currently sure this is true. Apparently in the hospital as well the fact that she's had recent bilateral strokes was also discussed with both cardiology and neurology. It is not known that she has either atrial fibrillation or flutter. She was therefore discharged on Plavix and aspirin. Some instructions for a loop recorder and possibly a TEE "as an outpatient". Notable that she had an echocardiogram during her original hospitalization that showed an EF in the 30% range however her second echocardiogram showed improvement in this suggesting  that this was a stress cardiomyopathy not active ischemia. This possibility was suggested during the original echocardiogram interpretation as a possibility for what was causing the severe limitation in left ventricular function.  Since her arrival back here she is already having trouble. She is tachypneic and short of breath the. According to the staff she has had 10 bowel movements overnight and the stool has been sent for C. difficile assay. He is apparently eating and drinking well nectar thick and pured. She is being followed by speech therapy.  BMP Latest Ref Rng 06/20/2015 06/17/2015 06/15/2015  Glucose 65 - 99 mg/dL 409(W105(H) 119(J108(H) 478(G129(H)  BUN 6 - 20 mg/dL 12 95(A21(H) 17  Creatinine 0.44 - 1.00 mg/dL 2.13(Y0.43(L) 8.65(H0.43(L) 8.460.49  Sodium 135 - 145 mmol/L 137 139 136  Potassium 3.5 - 5.1 mmol/L 3.3(L) 3.9 3.4(L)  Chloride 101 - 111 mmol/L 104 106 100(L)  CO2 22 - 32 mmol/L 28 27 28   Calcium 8.9 - 10.3 mg/dL 8.2(L) 8.0(L) 7.9(L)   CBC Latest Ref Rng 06/20/2015 06/17/2015 06/15/2015  WBC 4.0 - 10.5 K/uL 18.7(H) 16.0(H) 19.7(H)  Hemoglobin 12.0 - 15.0 g/dL 10.9(L) 9.1(L) 9.9(L)  Hematocrit 36.0 - 46.0 % 33.2(L) 27.6(L) 29.8(L)  Platelets 150 - 400 K/uL 215 208 247      Past Medical History  Diagnosis Date  . Diabetes mellitus without complication (HCC)   . Hypertension   . Tobacco abuse   . Use of cane as ambulatory aid   . Leg edema, right     chronic   Past Surgical History  Procedure Laterality Date  . Cataract extraction    .  Hip surgery    . Foot surgery     Current Outpatient Prescriptions on File Prior to Visit  Medication Sig Dispense Refill  . albuterol (PROVENTIL) (2.5 MG/3ML) 0.083% nebulizer solution Take 2.5 mg by nebulization every 6 (six) hours as needed for wheezing or shortness of breath.    Marland Kitchen amoxicillin-clavulanate (AUGMENTIN) 500-125 MG tablet Take 1 tablet (500 mg total) by mouth every 8 (eight) hours. 21 tablet 0  . aspirin EC 81 MG tablet Take 81 mg by mouth  daily.    . clopidogrel (PLAVIX) 75 MG tablet Take 1 tablet (75 mg total) by mouth daily with breakfast. 30 tablet 3  . COMBIVENT RESPIMAT 20-100 MCG/ACT AERS respimat Inhale 2 puffs into the lungs 4 (four) times daily.     . furosemide (LASIX) 20 MG tablet Take 1 tablet (20 mg total) by mouth daily. 30 tablet 0  . guaiFENesin (MUCINEX) 600 MG 12 hr tablet Take 1 tablet (600 mg total) by mouth 2 (two) times daily. 14 tablet 0  . HYDROcodone-acetaminophen (NORCO) 5-325 MG tablet Take 1 tablet by mouth every 6 (six) hours as needed for moderate pain. 30 tablet 0  . metFORMIN (GLUCOPHAGE) 500 MG tablet Take 500 mg by mouth 2 (two) times daily with a meal.    . nystatin (MYCOSTATIN) 100000 UNIT/ML suspension Take 5 mLs (500,000 Units total) by mouth 4 (four) times daily. 60 mL 0  . pravastatin (PRAVACHOL) 40 MG tablet Take 2 tablets (80 mg total) by mouth every evening. 30 tablet 0  . predniSONE (DELTASONE) 10 MG tablet Take 40 mg for 4 days, then Take 30 mg for 3 days, then Take 20 mg for 3 days, Take 10 mg for 3 days, then stop.40 30 tablet 0  . senna-docusate (SENOKOT-S) 8.6-50 MG tablet Take 1 tablet by mouth at bedtime as needed for mild constipation. 20 tablet 0  . traZODone (DESYREL) 50 MG tablet Take 1 tablet (50 mg total) by mouth at bedtime as needed for sleep.      Social; this patient apparently lived in her own home before all of this happened. Apparently the home is been closed down and her 3 cats sent to the shelter. She was smoking heavily prior to this as well  reports that she has been smoking Cigarettes.  She does not have any smokeless tobacco history on file. She reports that she does not drink alcohol or use illicit drugs.  Family History  Problem Relation Age of Onset  . Transient ischemic attack Mother   . COPD Father   . Heart disease Brother     Review of systems Gen.; patient states she feels unwell. No fever or chills HEENT no oral pain Respiratory; some shortness  of breath is noted Cardiac no chest pain GI multiple diarrheal stools overnight, she is complaining of buttock's and peri-area irritation GU no clear dysuria Musculoskeletal; no active joints Neurologic; she is having problems swallowing although she apparently passed a swallowing study. She is on nectar thick and pured diet. She is being followed by speech therapy Mental status; per the staff some degree of confusion Endocrine; not a known diabetic  Physical examination Gen. patient looks to be in some respiratory distress she is tachypneic. Vitals; O2 sat 95% on 4 L. Respiratory rate 26 pulse rate 100 HEENT; no oral lesions are seen, visual fields are intact Respiratory; markedly reduced air entry bilaterally prolonged expiratory phase there is accessory muscle use. Cardiac; heart sounds are almost inaudible. Her JVP  is not obviously elevated Abdomen; no liver no spleen no tenderness no masses GU no suprapubic or costovertebral angle tenderness. Skin multiple ecchymosis over a large area of her abdomen presumably from Lovenox shots on top of Plavix and aspirin. She has a significant rash over her peri-area and buttock's painful Musculoskeletal; no active joints Neurologic; her cranial nerves seem intact including 9/10/7/5. She has not have any visual field deficits to confrontation. She has a dense left hemiparesis although she does have a flicker of movement in the left leg there is nothing in the left arm. Sensation seems intact to light touch. Mental status; the patient is awake alert and conversational  Impression/plan #1 acute right basal ganglial nonhemorrhagic CVA. Apparently on the background of an MRI showing a left basal ganglial CVA. I am not exactly certain who was supposed to set her up for a loop recorder and a TEE she remains on Plavix and aspirin #2 severe reduction in air entry bilaterally on lung auscultation with accessory muscle use. Her last chest x-ray was from 11/21  which showed improving infiltrates. She will need another chest x-ray. #3 probable continued exacerbation of her COPD. She is going to need nebulizers and perhaps more steroids. Speech therapy is actively following her here. #4 diastolic heart failure. It is hard to really determine if anything is active at present. #5 severe reduction in her original echocardiograms ejection fraction with improvement on the most recent ones suggesting this was a stress cardiomyopathy. There was no mural thrombus seen. #6 type 2 diabetes now on metformin 500 twice a day #7 discharged on prednisone 50 mg a day, unfortunately this is not the time to taper this. #8 painful rash over her buttocks and peri-area which could be all contact dermatitis plus or minus fungal #9 apparently copious diarrhea overnight. She is certainly at risk for C. difficile in fact I'll probably treat her emperically while we're await stool studies #10 urine culture from 11/21 showed Escherichia coli which was pansensitive I think the Augmentin can stop. #11 aspiration pneumonitis during her last hospitalization that should've been adequately treated. I'm going to look at a chest x-ray upright sitting today if this is possible  Patient is at high risk for return to the hospital largely because of her precarious aspiratory status. Her white count was elevated last week

## 2015-06-22 ENCOUNTER — Encounter (HOSPITAL_COMMUNITY)
Admission: RE | Admit: 2015-06-22 | Discharge: 2015-06-22 | Disposition: A | Discharge status: Home / Self Care | Payer: Medicare Other | Source: Skilled Nursing Facility | Attending: Internal Medicine | Admitting: Internal Medicine

## 2015-06-22 LAB — CBC WITH DIFFERENTIAL/PLATELET
Basophils Absolute: 0 10*3/uL (ref 0.0–0.1)
Basophils Relative: 0 %
EOS PCT: 0 %
Eosinophils Absolute: 0 10*3/uL (ref 0.0–0.7)
HCT: 34.5 % — ABNORMAL LOW (ref 36.0–46.0)
Hemoglobin: 11.3 g/dL — ABNORMAL LOW (ref 12.0–15.0)
LYMPHS ABS: 1 10*3/uL (ref 0.7–4.0)
LYMPHS PCT: 7 %
MCH: 27.6 pg (ref 26.0–34.0)
MCHC: 32.8 g/dL (ref 30.0–36.0)
MCV: 84.1 fL (ref 78.0–100.0)
MONO ABS: 0.7 10*3/uL (ref 0.1–1.0)
Monocytes Relative: 5 %
Neutro Abs: 12.6 10*3/uL — ABNORMAL HIGH (ref 1.7–7.7)
Neutrophils Relative %: 88 %
PLATELETS: 177 10*3/uL (ref 150–400)
RBC: 4.1 MIL/uL (ref 3.87–5.11)
RDW: 18.2 % — AB (ref 11.5–15.5)
WBC: 14.3 10*3/uL — ABNORMAL HIGH (ref 4.0–10.5)

## 2015-06-22 LAB — C DIFFICILE QUICK SCREEN W PCR REFLEX
C Diff antigen: NEGATIVE
C Diff interpretation: NEGATIVE
C Diff toxin: NEGATIVE

## 2015-06-22 LAB — BASIC METABOLIC PANEL
Anion gap: 6 (ref 5–15)
BUN: 11 mg/dL (ref 6–20)
CHLORIDE: 100 mmol/L — AB (ref 101–111)
CO2: 31 mmol/L (ref 22–32)
CREATININE: 0.37 mg/dL — AB (ref 0.44–1.00)
Calcium: 8.4 mg/dL — ABNORMAL LOW (ref 8.9–10.3)
GFR calc non Af Amer: >60 mL/min (ref >60)
Glucose, Bld: 108 mg/dL — ABNORMAL HIGH (ref 65–99)
POTASSIUM: 3.6 mmol/L (ref 3.5–5.1)
Sodium: 137 mmol/L (ref 135–145)

## 2015-06-24 ENCOUNTER — Encounter (HOSPITAL_COMMUNITY)
Admission: AD | Admit: 2015-06-24 | Discharge: 2015-06-24 | Disposition: A | Discharge status: Home / Self Care | Payer: Medicare Other | Source: Skilled Nursing Facility | Attending: Internal Medicine | Admitting: Internal Medicine

## 2015-06-24 LAB — BASIC METABOLIC PANEL
Anion gap: 7 (ref 5–15)
BUN: 12 mg/dL (ref 6–20)
CALCIUM: 8.7 mg/dL — AB (ref 8.9–10.3)
CO2: 32 mmol/L (ref 22–32)
CREATININE: 0.43 mg/dL — AB (ref 0.44–1.00)
Chloride: 100 mmol/L — ABNORMAL LOW (ref 101–111)
GFR calc non Af Amer: >60 mL/min (ref >60)
Glucose, Bld: 108 mg/dL — ABNORMAL HIGH (ref 65–99)
Potassium: 4.4 mmol/L (ref 3.5–5.1)
SODIUM: 139 mmol/L (ref 135–145)

## 2015-06-24 LAB — CBC WITH DIFFERENTIAL/PLATELET
BASOS PCT: 0 %
Basophils Absolute: 0 10*3/uL (ref 0.0–0.1)
EOS ABS: 0.1 10*3/uL (ref 0.0–0.7)
EOS PCT: 1 %
HCT: 34.3 % — ABNORMAL LOW (ref 36.0–46.0)
HEMOGLOBIN: 10.9 g/dL — AB (ref 12.0–15.0)
Lymphocytes Relative: 12 %
Lymphs Abs: 1.1 10*3/uL (ref 0.7–4.0)
MCH: 27.4 pg (ref 26.0–34.0)
MCHC: 31.8 g/dL (ref 30.0–36.0)
MCV: 86.2 fL (ref 78.0–100.0)
MONOS PCT: 6 %
Monocytes Absolute: 0.6 10*3/uL (ref 0.1–1.0)
NEUTROS PCT: 81 %
Neutro Abs: 7.5 10*3/uL (ref 1.7–7.7)
PLATELETS: 152 10*3/uL (ref 150–400)
RBC: 3.98 MIL/uL (ref 3.87–5.11)
RDW: 18.9 % — AB (ref 11.5–15.5)
WBC: 9.2 10*3/uL (ref 4.0–10.5)

## 2015-06-27 ENCOUNTER — Inpatient Hospital Stay (INDEPENDENT_AMBULATORY_CARE_PROVIDER_SITE_OTHER): Payer: Medicare Other

## 2015-06-27 ENCOUNTER — Encounter (HOSPITAL_COMMUNITY)
Admission: RE | Admit: 2015-06-27 | Discharge: 2015-06-27 | Disposition: A | Discharge status: Home / Self Care | Payer: Medicare Other | Source: Skilled Nursing Facility | Attending: Internal Medicine | Admitting: Internal Medicine

## 2015-06-27 DIAGNOSIS — R002 Palpitations: Secondary | ICD-10-CM | POA: Diagnosis not present

## 2015-06-27 DIAGNOSIS — I639 Cerebral infarction, unspecified: Secondary | ICD-10-CM | POA: Diagnosis not present

## 2015-06-27 DIAGNOSIS — I4891 Unspecified atrial fibrillation: Secondary | ICD-10-CM

## 2015-06-27 LAB — BASIC METABOLIC PANEL
Anion gap: 7 (ref 5–15)
BUN: 10 mg/dL (ref 6–20)
CALCIUM: 8.4 mg/dL — AB (ref 8.9–10.3)
CHLORIDE: 98 mmol/L — AB (ref 101–111)
CO2: 32 mmol/L (ref 22–32)
CREATININE: 0.5 mg/dL (ref 0.44–1.00)
Glucose, Bld: 92 mg/dL (ref 65–99)
Potassium: 3.9 mmol/L (ref 3.5–5.1)
SODIUM: 137 mmol/L (ref 135–145)

## 2015-06-30 ENCOUNTER — Other Ambulatory Visit: Payer: Self-pay | Admitting: *Deleted

## 2015-06-30 MED ORDER — HYDROCODONE-ACETAMINOPHEN 5-325 MG PO TABS: ORAL_TABLET | ORAL | Status: DC

## 2015-06-30 NOTE — Telephone Encounter (Signed)
[facility]  

## 2015-07-02 NOTE — ED Notes (Signed)
This nurse reviewed chart for charting error on this date-- Noted that on 05/31/15 it was charted at 2032 pt was not kept NPO prior to swallowing screen - This was documented in error -- Pt was kept strict NPO from time of arrival until time of swallowing screen performance.

## 2015-07-09 ENCOUNTER — Inpatient Hospital Stay (HOSPITAL_COMMUNITY): Payer: Medicare Other | Attending: Internal Medicine

## 2015-07-09 ENCOUNTER — Encounter (HOSPITAL_COMMUNITY)
Admission: RE | Admit: 2015-07-09 | Discharge: 2015-07-09 | Disposition: A | Discharge status: Home / Self Care | Payer: Medicare Other | Source: Skilled Nursing Facility | Attending: Internal Medicine | Admitting: Internal Medicine

## 2015-07-11 ENCOUNTER — Non-Acute Institutional Stay (SKILLED_NURSING_FACILITY): Payer: Medicare Other | Admitting: Internal Medicine

## 2015-07-11 ENCOUNTER — Other Ambulatory Visit (HOSPITAL_COMMUNITY)
Admission: RE | Admit: 2015-07-11 | Discharge: 2015-07-11 | Disposition: A | Discharge status: Home / Self Care | Payer: Medicare Other | Source: Skilled Nursing Facility | Attending: Internal Medicine | Admitting: Internal Medicine

## 2015-07-11 DIAGNOSIS — M25571 Pain in right ankle and joints of right foot: Secondary | ICD-10-CM

## 2015-07-12 LAB — WOUND CULTURE

## 2015-07-13 ENCOUNTER — Non-Acute Institutional Stay (SKILLED_NURSING_FACILITY): Payer: Medicare Other | Admitting: Internal Medicine

## 2015-07-13 DIAGNOSIS — L97411 Non-pressure chronic ulcer of right heel and midfoot limited to breakdown of skin: Secondary | ICD-10-CM

## 2015-07-13 DIAGNOSIS — L03115 Cellulitis of right lower limb: Secondary | ICD-10-CM

## 2015-07-14 ENCOUNTER — Encounter (HOSPITAL_COMMUNITY)
Admission: AD | Admit: 2015-07-14 | Discharge: 2015-07-14 | Disposition: A | Discharge status: Home / Self Care | Payer: Medicare Other | Source: Skilled Nursing Facility | Attending: Internal Medicine | Admitting: Internal Medicine

## 2015-07-14 LAB — CBC WITH DIFFERENTIAL/PLATELET
Basophils Absolute: 0 10*3/uL (ref 0.0–0.1)
Basophils Relative: 0 %
EOS ABS: 0 10*3/uL (ref 0.0–0.7)
EOS PCT: 0 %
HCT: 34 % — ABNORMAL LOW (ref 36.0–46.0)
Hemoglobin: 11.1 g/dL — ABNORMAL LOW (ref 12.0–15.0)
LYMPHS ABS: 1.5 10*3/uL (ref 0.7–4.0)
LYMPHS PCT: 16 %
MCH: 27.5 pg (ref 26.0–34.0)
MCHC: 32.6 g/dL (ref 30.0–36.0)
MCV: 84.4 fL (ref 78.0–100.0)
MONO ABS: 0.6 10*3/uL (ref 0.1–1.0)
Monocytes Relative: 7 %
Neutro Abs: 7.3 10*3/uL (ref 1.7–7.7)
Neutrophils Relative %: 77 %
PLATELETS: 203 10*3/uL (ref 150–400)
RBC: 4.03 MIL/uL (ref 3.87–5.11)
RDW: 16.6 % — AB (ref 11.5–15.5)
WBC: 9.4 10*3/uL (ref 4.0–10.5)

## 2015-07-14 LAB — WOUND CULTURE: Gram Stain: NONE SEEN

## 2015-07-14 LAB — SEDIMENTATION RATE: Sed Rate: 20 mm/hr (ref 0–22)

## 2015-07-14 LAB — C-REACTIVE PROTEIN: CRP: 0.9 mg/dL (ref <1.0)

## 2015-07-15 ENCOUNTER — Encounter (HOSPITAL_COMMUNITY)
Admission: RE | Admit: 2015-07-15 | Discharge: 2015-07-15 | Disposition: A | Discharge status: Home / Self Care | Payer: Medicare Other | Source: Skilled Nursing Facility | Attending: Internal Medicine | Admitting: Internal Medicine

## 2015-07-15 LAB — PROTIME-INR
INR: 1.11 (ref 0.00–1.49)
PROTHROMBIN TIME: 14.5 s (ref 11.6–15.2)

## 2015-07-17 NOTE — Progress Notes (Addendum)
Patient ID: Tara Park, female   DOB: 12-18-1945, 69 y.o.   MRN: [id number]                PROGRESS NOTE  DATE:  07/11/2015           FACILITY: [facility]                    LEVEL OF CARE:   SNF   Acute Visit                   CHIEF COMPLAINT:  Right ankle pain and swelling.    HISTORY OF PRESENT ILLNESS:  Apparently over the last week, the patient has developed increasing erythema and swelling on the lateral aspect of her right ankle, centered about the right lateral malleolus.  An x-ray of the ankle was done on 07/09/2015.  This showed ankle joint space narrowing, diffuse soft tissue swelling.  She has lateral plates and numerous screws at the lateral calcaneus post ORIF.  The hardware was intact without diffuse surrounding lucencies.  There were no acute bony abnormalities.    The patient apparently also has a small draining area in what was probably a surgical incision area on the lateral aspect of the right heel.   I did not previously know about this.  She has already been put on Septra out of concern for a cellulitis over the phone.  She does not carry a history of gout or pseudogout that I can see.    PAST MEDICAL HISTORY/PROBLEM LIST:  Past Medical History  Diagnosis Date  . Diabetes mellitus without complication (HCC)   . Hypertension   . Tobacco abuse   . Use of cane as ambulatory aid   . Leg edema, right     chronic      PAST SURGICAL HISTORY:  Past Surgical History  Procedure Laterality Date  . Cataract extraction    . Hip surgery    . Foot surgery        CURRENT MEDICATIONS:  Medication list is reviewed.    PHYSICAL EXAMINATION:   MUSCULOSKELETAL:   EXTREMITIES:   RIGHT LOWER EXTREMITY:  Right ankle:  There is considerable swelling and erythema on the lateral aspect of the right ankle.  Point tenderness over the right lateral malleolus.  There is a small draining area on the lateral aspect of the calcaneus, although what is coming out does  not appear to be grossly purulent.  The patient tells me that this has been present for many years.    ASSESSMENT/PLAN:   I am not certain whether this represents a soft tissue sprain.  She is now attempting to transfer with a support in physical therapy.  A crystal arthropathy perhaps, or an infection.  The Septra is not unreasonable and this started on 07/09/2015.  I would like to culture the drainage out of the lateral calcaneus.  Consider an anti-inflammatory for five days.

## 2015-07-18 NOTE — Progress Notes (Signed)
Patient ID: Tara Park, female   DOB: 1946-06-19, 69 y.o.   MRN: [id number]                PROGRESS NOTE  DATE:  07/13/2015         FACILITY: [facility]                      LEVEL OF CARE:   SNF   Acute Visit                      CHIEF COMPLAINT:  Review of right calcaneus wound.      HISTORY OF PRESENT ILLNESS:  This is a patient who came to us after suffering a left basal ganglia CVA.    Subsequently, she had a hospitalization for aspiration pneumonia.  She had baseline COPD.    She is on Plavix and aspirin.  She was supposed to have an outpatient TEE and a loop recorder.  I am not sure if this was ever arranged.    In any case, I became aware two days ago of a draining area on the lateral aspect of her right calcaneus.  The patient has had previous surgery here.  An x-ray did not show any evidence of infection.  There was erythema around the lateral aspect of her ankle, particularly the lateral malleolus.  The drainage material was purulent enough that I was concerned enough to do a culture which shows MRSA.  The patient is already on Septra.    PAST MEDICAL HISTORY/PROBLEM LIST:  Reviewed.      PAST SURGICAL HISTORY:   Reviewed.       PHYSICAL EXAMINATION:   GENERAL APPEARANCE:  The patient is not in any distress.              MUSCULOSKELETAL:   EXTREMITIES:   RIGHT LOWER EXTREMITY:  Right leg:  She has a draining area on the lateral aspect of the right calcaneus.  This goes down roughly  cm.  I do not feel bone or hardware.  There is erythema here, but this seems better than when I saw her two days ago.  She is tender even around the medial malleolus.   There is no evidence of spreading cellulitis.       ASSESSMENT/PLAN:                Non-pressure area on the right heel, open wound in the right lateral calcaneus, as described.  This contains MRSA.  I dressed this with silver alginate and foam.  She is going to need antibiotics and I am going to trial her on  oral antibiotics, Septra and clindamycin, for two weeks.  She may ultimately need IV antibiotics, and there is a concern about the underlying hardware here.  The patient states that this has been draining for many years, off and on, and the open area has not closed since 2005.  Her original calcaneus surgery was done by Dr. Lestine BoxBednarz.  I will check a C-reactive protein, sedimentation rate.  This situation is concerning for a deeper infection.       CPT CODE: [id number]

## 2015-07-26 ENCOUNTER — Encounter (HOSPITAL_COMMUNITY)
Admission: AD | Admit: 2015-07-26 | Discharge: 2015-07-26 | Disposition: A | Discharge status: Home / Self Care | Payer: Medicare Other | Source: Skilled Nursing Facility | Attending: Internal Medicine | Admitting: Internal Medicine

## 2015-07-26 LAB — URINALYSIS, ROUTINE W REFLEX MICROSCOPIC
Bilirubin Urine: NEGATIVE
Glucose, UA: NEGATIVE mg/dL
Hgb urine dipstick: NEGATIVE
Ketones, ur: NEGATIVE mg/dL
LEUKOCYTES UA: NEGATIVE
NITRITE: NEGATIVE
PH: 6 (ref 5.0–8.0)
Protein, ur: NEGATIVE mg/dL
SPECIFIC GRAVITY, URINE: 1.025 (ref 1.005–1.030)

## 2015-07-27 ENCOUNTER — Non-Acute Institutional Stay (SKILLED_NURSING_FACILITY): Payer: Medicare Other | Admitting: Internal Medicine

## 2015-07-27 ENCOUNTER — Encounter: Payer: Self-pay | Admitting: Internal Medicine

## 2015-07-27 DIAGNOSIS — L97519 Non-pressure chronic ulcer of other part of right foot with unspecified severity: Secondary | ICD-10-CM | POA: Diagnosis not present

## 2015-07-27 DIAGNOSIS — L97411 Non-pressure chronic ulcer of right heel and midfoot limited to breakdown of skin: Secondary | ICD-10-CM | POA: Diagnosis not present

## 2015-07-27 DIAGNOSIS — E11621 Type 2 diabetes mellitus with foot ulcer: Secondary | ICD-10-CM | POA: Diagnosis not present

## 2015-07-28 LAB — URINE CULTURE: Culture: NO GROWTH

## 2015-07-29 NOTE — Progress Notes (Addendum)
Patient ID: Tara Park, female   DOB: 10-09-1945, 70 y.o.   MRN: [id number]                PROGRESS NOTE  DATE:  07/27/2015        FACILITY: [facility]                 LEVEL OF CARE:   SNF   Acute Visit           CHIEF COMPLAINT:  Follow up right calcaneus.      HISTORY OF PRESENT ILLNESS:  This is a patient who came to us after suffering a basal ganglia CVA on the left.  She has right hemiparesis and baseline COPD.    In the middle of December, I became aware of a chronic wound on the base of her right lateral calcaneus.  This ankle has hardware in it from previous trauma dating back to 2005.  An x-ray did not show evidence of infection.  She had erythema around the lateral aspect of her ankle, particularly the lateral malleolus.  The drainage coming out of the wound was purulent enough that I did a culture that showed MRSA.  I gave her a two-week course of Septra and clindamycin.  The erythema has resolved and the drainage has stopped.  The x-ray from 07/09/2015 shows a lateral plate, numerous screws, and numerous K-wires at the lateral calcaneus, post ORIF.  No definite acute changes.  As noted, the culture of the drainage from this area showed MRSA.    LABORATORY DATA:   Her C-reactive protein was not elevated at 0.9.    Sedimentation rate was 20.    White count 9.4 with a neutrophil count of 77%, which is normal.  PHYSICAL EXAMINATION:   MUSCULOSKELETAL:   EXTREMITIES:   RIGHT LOWER EXTREMITY:  Right foot:  Peripheral pulses are palpable.  There remains a linear ulceration on the lateral aspect of the right calcaneus just above the plantar aspect.  Probing this today probes to bone.  There is no surrounding erythema and no surrounding tenderness.    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   The possibility of underlying osteomyelitis here is reasonably high.  At this point, I am going to continue  to have a wait-and-see approach to this area.  If she develops erythema and purulent drainage, I am going to give her a six-week course of IV antibiotics, probably with chronic suppressive therapy after that.  The more correct medical call, or should I say surgical approach here, would be to remove the hardware.  However, I really am looking to spare this frail patient this type of aggressive surgery unless I cannot avoid it.  At this point, the sedimentation rate and C-reactive protein do not suggest an active infection.  I am going to continue with the silver alginate based foam cover dressings.

## 2015-08-02 ENCOUNTER — Non-Acute Institutional Stay (SKILLED_NURSING_FACILITY): Payer: Medicare Other | Admitting: Internal Medicine

## 2015-08-02 ENCOUNTER — Encounter: Payer: Self-pay | Admitting: Internal Medicine

## 2015-08-02 DIAGNOSIS — I5022 Chronic systolic (congestive) heart failure: Secondary | ICD-10-CM | POA: Diagnosis not present

## 2015-08-02 DIAGNOSIS — J42 Unspecified chronic bronchitis: Secondary | ICD-10-CM

## 2015-08-02 DIAGNOSIS — F05 Delirium due to known physiological condition: Secondary | ICD-10-CM | POA: Diagnosis not present

## 2015-08-02 DIAGNOSIS — R410 Disorientation, unspecified: Secondary | ICD-10-CM

## 2015-08-02 DIAGNOSIS — I639 Cerebral infarction, unspecified: Secondary | ICD-10-CM

## 2015-08-02 NOTE — Progress Notes (Signed)
Patient ID: Tara Park, female   DOB: 07-05-1946, 70 y.o.   MRN: [id number]      This is a routine- acute visit.  Level care skilled.  Facility [facility].  Chief complaint-medical management of chronic issues including COPD-CVA with left-sided hemiparalysis-hypertension-diabetes type 2-acute visit secondary to mental status changes  History of present illness.  Patient is a very medically complex 70 year old female with a history of COPD on chronic oxygen diabetes and recent acute ischemic CVA.  She initially came the facility for short period but had acute shortness of breath and return to the hospital-.  Chest x-ray did show a developing infiltrate in the right lung base lab work showed hypokalemia with potassium of 3.2 her white count was up to 28,100.  Prior echocardiogram actually did show a questionable obesity at the apical packs of the heart concerning for thrombus.  However there was a repeat echo done that did not show any evidence of a thrombus and ejection fraction actually showed improvement from 25-30 percent up to 70-75 percent with diastolic dysfunction.  Regards to aspiration pneumonia-she was started on Unasyn and clinically she improved.  Urine culture also grew out Escherichia coli  And this was treated with Augmentin  She also has a history of significant COPD she is on albuterol nebulizers routine as well as when necessary-he is also on prednisone routinely 50 mg a day-when patient was initially evaluated with Dr. Leanord Hawking she did have significant respiratory issues but this appears to have stabilized quite significantly   .  In regards to her CVA neurology was consulted patient had repeat CT of the head that showed a right basal ganglia infarct previous CT scan and MRI showed infarct on the left side.  It was decided patient would receive a 30 day monitor as an outpatient although I don't believe this has been done yet  Recommendation was to continue  aspirin and Plavix as anticoagulation.  She continues to have significant weakness.  Patient does have some baseline confusion-talking with her family this was present even in the hospital-today she can tell me the month President of the [place] and can have a fairly coherent conversation-however she does have what appears possibly some psychosis thinking that she can change the channel on the TV by just moving her hands over the TV screeen--apparently this is been going on for some time with these behaviors.  He does not complain of any shortness of breath dizziness or headache-vital signs appear to be stable.  A recent urine culture was not very remarkable.    Her blood pressure medicines were discontinued her goal blood pressureinitiallyiwas 130-150--and this appears to be stable recent blood pressure 132/72 --neurology recommended permissive hypertension due to her progressive stroke and intracranial stenosis.   Patient does have a wound on her right calcaneus-this is followed closely by Dr. 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  She does also a type II diabetic on Glucophage CBGs are stable.  Regards to CHF she is on Lasix with potassium supplementation-she appears to have lost some weight about 10 pounds over the past month-I suspect she may have some intake issues and she has been started on supplementation this will have to be watched         Previous medical history  Aspiration pneumonia.  UTI.  Diabetes.  Tobacco abuse.  Hypertension.  Acute ischemic stroke.  Demand ischemia.  Hypokalemia.  Chronic c CHF.--Echo November 2016 showed ejection fraction 70-75 percent with grade 1 diastolic dysfunction  Murrell thrombus of the cardiac apex.  COPD.  Chronic hypoxic respiratory failure.  Previous surgical history.  History of hip  surgery-cataract extraction-foot surgery.  Social history-before hospitalization she was a current every day smoker.  No history of significant alcohol smokeless tobacco use or illicit drug use.  Family history.  TIA mother.  Coronary artery disease brother.  History of COPD in her father.  .  Medications. reviewed   y.  Senokot daily at bedtime when necessary.  Pravastatin 80 mg daily at bedtime.  Prednisone 50 mg by mouth daily .  Trazodone 50 mg daily at bedtime when necessary.  Albuterol nebulizer every 6 hours when necessary  Albuterol nebulizer 3 times a day routine.  Aspirin enteric-coated 81 mg daily.  Plavix 75 mg daily.  Digoxin 125 g daily  Lasix 20 mg daily.  Vicodin 5-3 25 mg every 6 hours when necessary.  Glucophage 500 mgBID  Potassium 10 mEq daily  Melatonin 3 g daily at bedtime.      Review of systems.  In general does not complain of fever chills says she feels quite weak however.  Skin does not complain of rashes or itching--right calcaneal wound as noted above-.  Head ears eyes nose mouth and throat-does not complain of sore throat or nasal discharge .  Respiratory does not complain currently of shortness of breath or cough she had severe COPD on chronic oxygen.--With this appears to have stabilized  Cardiac does not complain of chest pain or palpitations has mild lower extremity edema.  GI does not complain of abdominal discomfort nausea vomiting diarrhea or constipation  GU does not complain of dysuria currently.  Musculoskeletal has left-sided weakness but does not complain of joint pain.  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  Psych --apparently this has become a fairly significant issue as noted above with some nonsensical talk and psychosis   Physical exam.  Temperature 97.9 pulse 76 respirations 20 blood pressure 132/72--weight  is 140.6  General this is a pleasant elderly female in no distress lying in bed.  Her skin is warm and dry--she has dressing over her lower right leg with a history of calcaneal wound followed by wound care-.  She also has a small open area above her buttocks midline-there is no drainage bleeding or sign of infection.  Eyes pupils appear to be reactive to light visual acuity appears grossly intact sclera and conjunctivae are clear.  Oropharynx thrush appears to be resolving mucous membranes are moist.  Chest--shallow air entry there is no labored breathing there are some bilateral crackles.  Abdomen is soft nontender with positive bowel sounds.  Heart is regular rate and rhythm without murmur gallop or rub she has mild bilateral lower extremity edema greater on the right versus the left per nursing this is chronic  Muscle skeletal continues with left-sided hemiparalysis and mouth droop strength preserved on the right it appears with positive grip strength is able to move her right leg-her speech is clear.  Neurologic as noted above left-sided hemiparalysis-right-sided strength appears to be preserved her speech is clear-does have a mouth droop.  Psych she is alert and oriented to self time she could tell me who the president is and who the incoming president is-however when asked how she changes the TV she does raise her hand T indicates she  basically moves her hand over the screen-apparently these behaviors are not new and she is given similar comments to her family and nursing.  Also apparently she does at time speak nonsensically.  Labs  This a number 22nd 2016.  WBC 9.4 hemoglobin 11.1 platelets 203.  07/07/2015.  Sodium 137 potassium 3.9 BUN 10 creatinine 0.5.  06/17/2015.  Albumin 2.5 otherwise liver function tests within normal limits.  Digoxin-0.6.  Marland Kitchen  06/15/2015.  Sodium 136 potassium 3.4 BUN 17 creatinine 0.49.  WBC 19.7 hemoglobin 9.9 platelets  247.  Assessment and plan.  #1-history of aspiration pneumonia with history COPD-this appears to have stabilized she is status post antibiotic-she is on routine albuterol nebulizers as well as when necessary she also continues on prednisone at some point consider titrating this down she appears to have stabilized   #2 UTI- This was treated apparently successfully with Augmentin - secondary to recent  confusion question psychosis a urine was obtained which apparently was unremarkable.  #3 history of CVA-again she will need neurology follow-up she is on Lasix as well as aspirin recommendation for permissive hypertension she is no longer on her clonidine Imdur or lisinopril-at this point will monitor.  #4 history of diabetes type 2 she is on Glucophage blood sugars at this point appears to be stable    #5 hypokalemia-this is being supplemented has been stable now for some time will update metabolic panel potassium of 3.9 noted on 07/07/2015  #5 insomnia she is now on melatonin as well as trazodone-  #6 history CHF she is on low-dose Lasix-she actually has lost weight I suspect some of this is due to poor by mouth intake and not fluid loss-she does appear to be compensated at this time continue to monitor.  #7 history of weight loss she now is on Magic cup as well as Protostat twice a day this is followed by dietary as well at this point monitor-with patient's possible psychosis this may be contributing to issues as well    #8-history of right calcaneal wound-as noted above this being treated topically currently and followed closely by wound care she has received antibiotics in the past-per Dr. Jannetta Quint assessment not a good surgical candidate with her numerous comorbidities and frailty--she does have a history of hardware in the area      .  #8-history of distolic CHF-she is on Lasix again will update metabolic panel    #9-again most acute issue recently appears to be increased  confusion psychosis-she will need a psychiatric consult-also will update lab work again including a metabolic panel TSH ammonia level and CBC--recent urine was not remarkable will await psychiatric input however   [id number]-of note greater than 45 minutes spent assessing patient-reviewing her chart-discussing her status with nursing staff-and coordinating and formulating a plan of care for numerous diagnoses-of note greater than 50% of time spent coordinating plan of care      .

## 2015-08-03 ENCOUNTER — Non-Acute Institutional Stay (SKILLED_NURSING_FACILITY): Payer: Medicare Other | Admitting: Internal Medicine

## 2015-08-03 ENCOUNTER — Encounter (HOSPITAL_COMMUNITY)
Admission: AD | Admit: 2015-08-03 | Discharge: 2015-08-03 | Disposition: A | Discharge status: Home / Self Care | Payer: Medicare Other | Source: Skilled Nursing Facility | Attending: Internal Medicine | Admitting: Internal Medicine

## 2015-08-03 DIAGNOSIS — J42 Unspecified chronic bronchitis: Secondary | ICD-10-CM

## 2015-08-03 DIAGNOSIS — F19921 Other psychoactive substance use, unspecified with intoxication with delirium: Secondary | ICD-10-CM | POA: Diagnosis not present

## 2015-08-03 DIAGNOSIS — T50905A Adverse effect of unspecified drugs, medicaments and biological substances, initial encounter: Secondary | ICD-10-CM

## 2015-08-03 LAB — CBC WITH DIFFERENTIAL/PLATELET
Basophils Absolute: 0 10*3/uL (ref 0.0–0.1)
Basophils Relative: 0 %
Eosinophils Absolute: 0 10*3/uL (ref 0.0–0.7)
Eosinophils Relative: 0 %
HCT: 31.9 % — ABNORMAL LOW (ref 36.0–46.0)
HEMOGLOBIN: 10.3 g/dL — AB (ref 12.0–15.0)
LYMPHS ABS: 1.6 10*3/uL (ref 0.7–4.0)
LYMPHS PCT: 16 %
MCH: 28.1 pg (ref 26.0–34.0)
MCHC: 32.3 g/dL (ref 30.0–36.0)
MCV: 87.2 fL (ref 78.0–100.0)
Monocytes Absolute: 0.8 10*3/uL (ref 0.1–1.0)
Monocytes Relative: 8 %
NEUTROS PCT: 76 %
Neutro Abs: 7.3 10*3/uL (ref 1.7–7.7)
Platelets: 158 10*3/uL (ref 150–400)
RBC: 3.66 MIL/uL — AB (ref 3.87–5.11)
RDW: 17 % — ABNORMAL HIGH (ref 11.5–15.5)
WBC: 9.7 10*3/uL (ref 4.0–10.5)

## 2015-08-03 LAB — URINALYSIS, ROUTINE W REFLEX MICROSCOPIC
Bilirubin Urine: NEGATIVE
Glucose, UA: NEGATIVE mg/dL
Hgb urine dipstick: NEGATIVE
KETONES UR: NEGATIVE mg/dL
LEUKOCYTES UA: NEGATIVE
NITRITE: NEGATIVE
PH: 7 (ref 5.0–8.0)
Protein, ur: NEGATIVE mg/dL
Specific Gravity, Urine: 1.01 (ref 1.005–1.030)

## 2015-08-03 LAB — COMPREHENSIVE METABOLIC PANEL
ALT: 33 U/L (ref 14–54)
AST: 25 U/L (ref 15–41)
Albumin: 3.1 g/dL — ABNORMAL LOW (ref 3.5–5.0)
Alkaline Phosphatase: 74 U/L (ref 38–126)
Anion gap: 6 (ref 5–15)
BUN: 21 mg/dL — ABNORMAL HIGH (ref 6–20)
CHLORIDE: 103 mmol/L (ref 101–111)
CO2: 29 mmol/L (ref 22–32)
Calcium: 8.5 mg/dL — ABNORMAL LOW (ref 8.9–10.3)
Creatinine, Ser: 0.42 mg/dL — ABNORMAL LOW (ref 0.44–1.00)
Glucose, Bld: 85 mg/dL (ref 65–99)
POTASSIUM: 4 mmol/L (ref 3.5–5.1)
Sodium: 138 mmol/L (ref 135–145)
Total Bilirubin: 0.9 mg/dL (ref 0.3–1.2)
Total Protein: 5.5 g/dL — ABNORMAL LOW (ref 6.5–8.1)

## 2015-08-03 LAB — AMMONIA: Ammonia: 18 umol/L (ref 9–35)

## 2015-08-03 LAB — TSH: TSH: 2.364 u[IU]/mL (ref 0.350–4.500)

## 2015-08-04 ENCOUNTER — Encounter (HOSPITAL_COMMUNITY)
Admission: AD | Admit: 2015-08-04 | Discharge: 2015-08-04 | Disposition: A | Discharge status: Home / Self Care | Payer: Medicare Other | Source: Skilled Nursing Facility | Attending: Internal Medicine | Admitting: Internal Medicine

## 2015-08-04 LAB — LIPID PANEL
CHOL/HDL RATIO: 2.6 ratio
Cholesterol: 112 mg/dL (ref 0–200)
HDL: 43 mg/dL (ref >40)
LDL CALC: 54 mg/dL (ref 0–99)
TRIGLYCERIDES: 77 mg/dL (ref <150)
VLDL: 15 mg/dL (ref 0–40)

## 2015-08-04 LAB — DIGOXIN LEVEL: Digoxin Level: 0.4 ng/mL — ABNORMAL LOW (ref 0.8–2.0)

## 2015-08-04 LAB — BASIC METABOLIC PANEL
ANION GAP: 7 (ref 5–15)
BUN: 16 mg/dL (ref 6–20)
CALCIUM: 8.5 mg/dL — AB (ref 8.9–10.3)
CO2: 29 mmol/L (ref 22–32)
CREATININE: 0.38 mg/dL — AB (ref 0.44–1.00)
Chloride: 103 mmol/L (ref 101–111)
GFR calc Af Amer: >60 mL/min (ref >60)
GFR calc non Af Amer: >60 mL/min (ref >60)
GLUCOSE: 87 mg/dL (ref 65–99)
Potassium: 3.8 mmol/L (ref 3.5–5.1)
Sodium: 139 mmol/L (ref 135–145)

## 2015-08-06 LAB — URINE CULTURE

## 2015-08-07 ENCOUNTER — Non-Acute Institutional Stay (SKILLED_NURSING_FACILITY): Payer: Medicare Other | Admitting: Internal Medicine

## 2015-08-07 DIAGNOSIS — F19921 Other psychoactive substance use, unspecified with intoxication with delirium: Secondary | ICD-10-CM

## 2015-08-07 DIAGNOSIS — I639 Cerebral infarction, unspecified: Secondary | ICD-10-CM

## 2015-08-07 DIAGNOSIS — T50905A Adverse effect of unspecified drugs, medicaments and biological substances, initial encounter: Secondary | ICD-10-CM

## 2015-08-07 NOTE — Progress Notes (Addendum)
Patient ID: Tara Park, female   DOB: 1946-03-17, 70 y.o.   MRN: [id number]                PROGRESS NOTE  DATE:  08/03/2015        FACILITY: [facility]                LEVEL OF CARE:   SNF   Acute Visit               CHIEF COMPLAINT:  Increasingly abnormal thought content.      HISTORY OF PRESENT ILLNESS:  Apparently over the last 7-10 days, it has been noted that the patient has had increasingly unstable ideas which she has articulated both to the staff and her family.  These have included things such as the idea that she can raise the volume on her TV just by using her hand.  Today, she called me into the room with the TV controller in her hand, rubbing it on her thigh, stating that she could hear something saying the battery was low.  As it turns out, this was her telephone warning her that her battery was low.  She was convinced that it was the TV controller.  This has been worked up by [employer], including a comprehensive metabolic panel and a CBC which are reasonably normal.  She had a urine for C&S which was negative.  She has no known psychiatric history that the patient is aware of, and certainly nothing in her record.    PAST MEDICAL HISTORY/PROBLEM LIST:  Past Medical History  Diagnosis Date  . Diabetes mellitus without complication (HCC)   . Hypertension   . Tobacco abuse   . Use of cane as ambulatory aid   . Leg edema, right     chronic   Past Surgical History  Procedure Laterality Date  . Cataract extraction    . Hip surgery    . Foot surgery         CURRENT MEDICATIONS:  Medication list is reviewed.                 Aspirin 81 q.d.      Plavix 75 q.d.      Digoxin 0.125 q.d.      Lasix 20 q.d.      Hydrocodone 5/325 q.6 p.r.n.      Metformin 500 b.i.d.      Pravastatin 40 q.d.      Trazodone 50 q.d.      Prednisone 50 q.d.      Albuterol nebulizers q.2 p.r.n.      KCl 20 mEq twice a day.    REVIEW OF SYSTEMS:    HEENT:   The  patient does not complain of headaches.    CHEST/RESPIRATORY:  No shortness of breath.  No cough.   CARDIAC:  No chest pain.   GI:  No abdominal pain.   No diarrhea.   GU:  No dysuria.    NEUROLOGICAL:  No new findings.  She has left hemiparesis from her original right basal ganglia hemorrhage.    PHYSICAL EXAMINATION:   GENERAL APPEARANCE:  The patient is awake, conversational.  She has some pressure of speech.     CHEST/RESPIRATORY:  Shallow, but otherwise clear air entry.      CARDIOVASCULAR:   CARDIAC:  Heart sounds are normal.  She appears to be euvolemic.        GASTROINTESTINAL:   LIVER/SPLEEN/KIDNEYS:  No  liver, no spleen.  No tenderness.      GENITOURINARY:   BLADDER:  There is no suprapubic tenderness or fullness.   Perhaps some CVA tenderness.     NEUROLOGICAL:   I see nothing new here.   PSYCHIATRIC:   MENTAL STATUS:  As noted, pressure of speech.  She has some circumferential thought pattern.  Nevertheless, she appears able to answer direct cognitive questioning.    ASSESSMENT/PLAN:             Delirium.   The cause behind this is not clear.  Recheck her lab work, including her urine (CVA tenderness).  Prednisone can certainly do this. This should have been tapered via our notes when she first returned to the building  History of a right basal ganglia CVA.  I see no reason to suspect she has had another stroke.    COPD.   stable.    On prednisone 50 mg.   I really do not have a recollection of this.  In the original orders, there was a taper.  There is some reference to me keeping her on this at the end of November.  I do not actually remember this.    I am going to add further lab work, including a dig level.  Begin a  taper of her prednisone.   Get a urine culture.  She may need a repeat imaging study of her head if this becomes worse. At this point I think no additional medical rx or investigations are necessary until we see the dig level and reduce the prednisone with  clinical follow up

## 2015-08-07 NOTE — Progress Notes (Signed)
Patient ID: Tara Park, female   DOB: February 14, 1946, 70 y.o.   MRN: [id number] Facility; [facility] Chief complaint; follow-up psychosis/delirium History; this is a patient I saw on 1/11. I had had staff reports of a week worth of bizarre behavior and comments. This actually bordered on psychosis. Urine had been done which was negative lab work essentially unremarkable. In reviewing her medication list I noted that she came in on prednisone and for one reason or another this was never actually tapered in spite of the fact that there was actually in order to do so in at least one variation of her discharge summary I think after a short returned to the hospital after her initial admission for an acute CVA. I noted she was also on digoxin which apparently she should not of been on either lower level was only 0.4 not likely to cause mental status changes. Digoxin was also not included on the second discharge summary, it would appear that the orders reverted back to her original discharge summary at which time prednisone was on 50 mg a day without a taper and the patient was on digoxin.  As the patient has now been on prednisone for 6-7 weeks I been tapering this gradually. She has known COPD. I think she is actually some better in terms of her mental status. I also did a urine on her as she had some degree of CVA tenderness which she still has. The urine culture grew enterococcus which is ampicillin sensitive   Past Medical History  Diagnosis Date  . Diabetes mellitus without complication (HCC)   . Hypertension   . Tobacco abuse   . Use of cane as ambulatory aid   . Leg edema, right     chronic  Current medications Melatonin 3 mg daily ASA 81 daily Plavix 75 daily Digoxin I have discontinued this over the phone Lasix 20 daily Hydrocodone 5/325 every 6 when necessary Metformin 500 twice a day Pravastatin 40 daily Trazodone 50 mg by mouth daily at bedtime Prednisone she is now on a tapering dose of  prednisone which should be down to 30 mg by tomorrow.  Review of systems Respiratory states her breathing is stable on her chronic oxygen Cardiac no chest pain HEENT no headache no dizziness GI no abdominal pain no nausea vomiting GU she is not complaining of dysuria.   Physical examination Gen. respiratory shallow air entry bilaterally work of breathing is normal Cardiac heart sounds are normal no murmurs she appears to be euvolemic Abdomen no liver no spleen no tenderness GU no suprapubic but once again she has the same costovertebral angle tenderness. Neurologic no neurologic findings are obvious she had a lright basal ganglial CVA during her original admission which left her with significant rleft-sided weakness. I do not see anything new here.  Impression/plan #1 altered mental status/delirium I think this is likely secondary to the prednisone although I can't really rule out a component of an enterococcal UTI I'm going to start her on Amoxil. The prednisone taper should continue. I weight the aggressiveness of the prednisone withdrawal. She appears to be doing fairly well on this currently my plan is to still take this down to off if possible #2 digoxin; it was not planned that the patient actually beyond this and I have reviewed the second discharge summary. I discontinued this over the phone and I think this was correct

## 2015-08-10 ENCOUNTER — Non-Acute Institutional Stay (SKILLED_NURSING_FACILITY): Payer: Medicare Other | Admitting: Internal Medicine

## 2015-08-10 DIAGNOSIS — T50905A Adverse effect of unspecified drugs, medicaments and biological substances, initial encounter: Secondary | ICD-10-CM

## 2015-08-10 DIAGNOSIS — F19921 Other psychoactive substance use, unspecified with intoxication with delirium: Secondary | ICD-10-CM | POA: Diagnosis not present

## 2015-08-11 ENCOUNTER — Other Ambulatory Visit: Payer: Self-pay | Admitting: *Deleted

## 2015-08-11 MED ORDER — HYDROCODONE-ACETAMINOPHEN 5-325 MG PO TABS: ORAL_TABLET | ORAL | Status: DC

## 2015-08-16 NOTE — Progress Notes (Addendum)
Patient ID: Tara Park, female   DOB: August 08, 1945, 70 y.o.   MRN: [id number]                PROGRESS NOTE  DATE:  08/10/2015        FACILITY: [facility]                    LEVEL OF CARE:   SNF   Acute Visit         CHIEF COMPLAINT:  Continued follow-up.      HISTORY OF PRESENT ILLNESS:  We continue to have a lot of difficulty with this patient.  I had found her to be on a high dose of prednisone, which should have been tapered in the early part of December.  I have been tapering her down with the idea that her prednisone was really altering her mental status.  Unfortunately, we really have not had a good effect yet, although she probably still is on 30 mg.    Apparently, she had a difficult weekend with increasing confusion.  Apparently was found playing in feces last night.    PAST MEDICAL HISTORY/PROBLEM LIST:   Reviewed.      PAST SURGICAL HISTORY:   Reviewed.      CURRENT MEDICATIONS:  Medication list is reviewed.   She is on 40 mg of prednisone currently and I have outlined a taper to off.  As noted, this actually was an error.  This should have been tapered when she first came into the building.    PHYSICAL EXAMINATION:   NEUROLOGICAL:   SENSATION/STRENGTH:  Left-sided weakness, which is not new for this patient.   PSYCHIATRIC:   MENTAL STATUS:  Really a difficult issue.  She has continued pressure of speech with tangential thought pattern.  There is some delusional thought mixed in with this; maybe some degree of depression, as well.      ASSESSMENT/PLAN:          Delirium +/- depression.    I am going to put her on a small dose of an antipsychotic while we continue to taper the prednisone.   She certainly has some depressive affect, although this is so variable that I think it is part of her depression.   I don't believe there is a change in her neurologic status that would justify repeat neuroimaging at this point but will need  To follow this closely     CPT CODE: 96295

## 2015-08-26 ENCOUNTER — Inpatient Hospital Stay (HOSPITAL_COMMUNITY): Payer: Medicare Other | Attending: Internal Medicine

## 2015-08-26 ENCOUNTER — Non-Acute Institutional Stay (SKILLED_NURSING_FACILITY): Payer: Medicare Other | Admitting: Internal Medicine

## 2015-08-26 ENCOUNTER — Ambulatory Visit (HOSPITAL_COMMUNITY): Payer: Medicare Other

## 2015-08-26 DIAGNOSIS — R609 Edema, unspecified: Secondary | ICD-10-CM | POA: Insufficient documentation

## 2015-08-26 DIAGNOSIS — I5022 Chronic systolic (congestive) heart failure: Secondary | ICD-10-CM | POA: Diagnosis not present

## 2015-08-26 DIAGNOSIS — I4891 Unspecified atrial fibrillation: Secondary | ICD-10-CM | POA: Diagnosis not present

## 2015-08-26 DIAGNOSIS — M79604 Pain in right leg: Secondary | ICD-10-CM

## 2015-08-26 DIAGNOSIS — J9611 Chronic respiratory failure with hypoxia: Secondary | ICD-10-CM | POA: Diagnosis not present

## 2015-08-26 DIAGNOSIS — R635 Abnormal weight gain: Secondary | ICD-10-CM

## 2015-08-26 DIAGNOSIS — R600 Localized edema: Secondary | ICD-10-CM | POA: Diagnosis not present

## 2015-08-27 ENCOUNTER — Other Ambulatory Visit (HOSPITAL_COMMUNITY)
Admission: RE | Admit: 2015-08-27 | Discharge: 2015-08-27 | Disposition: A | Discharge status: Home / Self Care | Payer: Medicare Other | Source: Skilled Nursing Facility | Attending: Internal Medicine | Admitting: Internal Medicine

## 2015-08-27 ENCOUNTER — Encounter: Payer: Self-pay | Admitting: Internal Medicine

## 2015-08-27 ENCOUNTER — Non-Acute Institutional Stay (SKILLED_NURSING_FACILITY): Payer: Medicare Other | Admitting: Internal Medicine

## 2015-08-27 DIAGNOSIS — J9611 Chronic respiratory failure with hypoxia: Secondary | ICD-10-CM | POA: Diagnosis not present

## 2015-08-27 DIAGNOSIS — I5022 Chronic systolic (congestive) heart failure: Secondary | ICD-10-CM | POA: Diagnosis not present

## 2015-08-27 DIAGNOSIS — R635 Abnormal weight gain: Secondary | ICD-10-CM | POA: Diagnosis not present

## 2015-08-27 DIAGNOSIS — R609 Edema, unspecified: Secondary | ICD-10-CM | POA: Insufficient documentation

## 2015-08-27 LAB — BASIC METABOLIC PANEL
Anion gap: 11 (ref 5–15)
BUN: 15 mg/dL (ref 6–20)
CALCIUM: 8.5 mg/dL — AB (ref 8.9–10.3)
CO2: 33 mmol/L — ABNORMAL HIGH (ref 22–32)
CREATININE: 0.73 mg/dL (ref 0.44–1.00)
Chloride: 98 mmol/L — ABNORMAL LOW (ref 101–111)
Glucose, Bld: 104 mg/dL — ABNORMAL HIGH (ref 65–99)
Potassium: 4.1 mmol/L (ref 3.5–5.1)
SODIUM: 142 mmol/L (ref 135–145)

## 2015-08-27 LAB — CBC WITH DIFFERENTIAL/PLATELET
BASOS PCT: 0 %
Basophils Absolute: 0 10*3/uL (ref 0.0–0.1)
EOS ABS: 0 10*3/uL (ref 0.0–0.7)
EOS PCT: 0 %
HCT: 33.7 % — ABNORMAL LOW (ref 36.0–46.0)
HEMOGLOBIN: 10.7 g/dL — AB (ref 12.0–15.0)
Lymphocytes Relative: 10 %
Lymphs Abs: 0.9 10*3/uL (ref 0.7–4.0)
MCH: 27.4 pg (ref 26.0–34.0)
MCHC: 31.8 g/dL (ref 30.0–36.0)
MCV: 86.2 fL (ref 78.0–100.0)
Monocytes Absolute: 0.5 10*3/uL (ref 0.1–1.0)
Monocytes Relative: 6 %
NEUTROS PCT: 84 %
Neutro Abs: 7.8 10*3/uL — ABNORMAL HIGH (ref 1.7–7.7)
PLATELETS: 166 10*3/uL (ref 150–400)
RBC: 3.91 MIL/uL (ref 3.87–5.11)
RDW: 15.1 % (ref 11.5–15.5)
WBC: 9.2 10*3/uL (ref 4.0–10.5)

## 2015-08-27 NOTE — Progress Notes (Signed)
Patient ID: Tara Park, female   DOB: [birth year], 70 y.o.   MRN: [id number]      Date is 08/26/2015  This is an acute visit  Level care skilled.  Facility [facility].  Chief complaint- Acute visit secondary to weight gain-increased edema-follow-up chronic respiratory failure--some wheezing  History of present illness.  Patient is a very medically complex 70 year old female with a history of COPD on chronic oxygen diabetes and recent acute ischemic CVA.  She initially came the facility for short period but had acute shortness of breath and return to the hospital-.  Chest x-ray did show a developing infiltrate in the right lung base lab work showed hypokalemia with potassium of 3.2 her white count was up to 28,100.  Prior echocardiogram actually did show a questionable obesity at the apical packs of the heart concerning for thrombus.  However there was a repeat echo done that did not show any evidence of a thrombus and ejection fraction actually showed improvement from 25-30 percent up to 70-75 percent with diastolic dysfunction.  Regards to aspiration pneumonia-she was started on Unasyn and clinically she improved.     She also has a history of significant COPD she is on albuterol nebulizers routine as well as when necessary- She has been on prednisone but appears this has been tapered off by Dr. Leanord Hawking.  She has had mental status changes Dr. Leanord Hawking was thinking possibly prednisone may be contributing to this and her prednisone again has been tapered down-metal status apparently has improved        Regards to CHF she is on Lasix with potassium supplementation-s Nursing staff has noted gain of about 10 pounds the past 2 weeks  previously she actually had weight loss of about 10 pounds.  Nursing staff feels she is having some increased edema however.  Currently she is on Lasix 20 mg a day and 10 mEq of potassium.  Currently patient does not complain of really  any increase shortness breath beyond her baseline she continues to be oxygen dependent--is somewhat anxious about her respiratory status         Previous medical history  Aspiration pneumonia.  UTI.  Diabetes.  Tobacco abuse.  Hypertension.  Acute ischemic stroke.  Demand ischemia.  Hypokalemia.  Chronic c CHF.--Echo November 2016 showed ejection fraction 70-75 percent with grade 1 diastolic dysfunction  Murrell thrombus of the cardiac apex.  COPD.  Chronic hypoxic respiratory failure.  Previous surgical history.  History of hip surgery-cataract extraction-foot surgery.  Social history-before hospitalization she was a current every day smoker.  No history of significant alcohol smokeless tobacco use or illicit drug use.  Family history.  TIA mother.  Coronary artery disease brother.  History of COPD in her father.  .  Medications. reviewed     Senokot daily at bedtime when necessary.  Pravastatin 80 mg daily at bedtime.  .  Trazodone 50 mg daily at bedtime when necessary.  Albuterol nebulizer every 6 hours when necessary  Albuterol nebulizer 3 times a day routine.  Aspirin enteric-coated 81 mg daily.  Plavix 75 mg daily.     Lasix 20 mg daily.  Vicodin 5-3 25 mg every 6 hours when necessary.  Glucophage 500 mgBID  Potassium 10 mEq daily  Melatonin 3 g daily at bedtime.      Review of systems.  In general does not complain of fever chills continues to complain of feeling weak--  Skin does not complain of rashes or itching--r has wounds that  are followed by wound care and Dr. Leanord Hawking including the right shin area  Head ears eyes nose mouth and throat-does not complain of sore throat or nasal discharge .  Respiratory  Continues to be oxygen dependent does not complaining of acute shortness of breath beyond baseline but does have somewhat chronic complaints of this  Cardiac does not complain of chest pain or  palpitations has increased lower extremity edema.  GI does not complain of abdominal discomfort nausea vomiting diarrhea or constipation  GU does not complain of dysuria currently.  Musculoskeletal has left-sided weakness but does not complain of joint pain.  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  Psych --as noted above mental status appears to have improved appears to have less psychotic behaviors she is pleasant appropriate alert today   Physical exam.  Temperature 98.2 pulse 78 respirations 20 blood pressure 142/78 O2 saturation in the 90s on chronic oxygen 3 L her weight is 150.2 this appears again of approximately 10 pounds over the past 2 weeks General this is a pleasant elderly female slightly anxious but in no distress.  Her skin is warm and dry She does have a wound of her right shin area that is followed by wound care at this point does not appear to have signs of infection     Eyes pupils appear to be reactive to light visual acuity appears grossly intact sclera and conjunctivae are clear.  Oropharynx  Appears clear mucous membranes moist  Chest--shallow air entry there is no labored breathing She does have some slight wheezing on the right lung fields  .Marland Kitchen  Abdomen is soft nontender with positive bowel sounds. -- Heart is regular occasional irregular beat without  murmur gallop or rub Heart sounds are distant--per nursing there is somewhat increased edema I would say 2+ on the right 1+ on the left pedal pulses are difficult to an assess secondary to edema  Muscle skeletal continues with left-sided hemiparalysis and mouth droop strength preserved on the right it appears with positive grip strength is able to move her right leg-her speech is clear. Neurologic as noted above left-sided hemiparalysis-right-sided strength appears to be preserved her speech is clear-does have a mouth  droop.  Psych she is alert and oriented  Appears able to give an accurate history recall appears appropriate     Labs  08/04/2015.  Sodium 139 potassium 3.8 BUN 16 creatinine 0.38.  08/03/2015.  WBC 9.7 hemoglobin 10.3 platelets 158  dec22nd 2016.  WBC 9.4 hemoglobin 11.1 platelets 203.  07/07/2015.  Sodium 137 potassium 3.9 BUN 10 creatinine 0.5.  06/17/2015.  Albumin 2.5 otherwise liver function tests within normal limits.  Digoxin-0.6.  Marland Kitchen  06/15/2015.  Sodium 136 potassium 3.4 BUN 17 creatinine 0.49.  WBC 19.7 hemoglobin 9.9 platelets 247.  Assessment and plan.  #1-history of aspiration pneumonia with history COPD-  she is status post antibiotic-she is on routine albuterol nebulizers as well as when necessary she She does have some slight wheezing on exam today Will update a chest x-ray especially in light of possibly some increased edema with a weight gain here to rule out any pulmonary edema as well  #2-history of weight gain-increased edema?-At this point patient does not appear to be in any acute distress-however concern with the pitting edema will give her an extra  dose of Lasix 20 mg   as well as potassium 10 mEq extra.  Will order stat blood  work including a CBC and metabolic panel tonight to keep an eye on her renal function and electrolytes.  Also with increased edema of right leg appears she does have some history of this as well she states she has had surgery on this leg-foot in the past-we will order venous Doppler however secondary to the fairly significant edema that I see this evening.  Also monitor her weights closely daily notify provider of gain greater than 3 pounds.  For now will monitor vital signs pulse ox every shift as well.   Addendum-I have reviewed the results of the stat labs-they appear to be fairly unremarkable sodium 142 potassium 4.1 BUN 15 creatinine 0.73 CO2 is minimally elevated at 33 her baseline appears to be in the  low 30s.  White count is 9.2 hemoglobin 10.7 which is relatively baseline platelets 160.  Venous Doppler is negative for any DVT.  Chest x-ray shows essentially atelectasis no acute process which is reassuring.  I have discussed this with Dr. Leanord Hawking  who will follow-up with patient  [id number]-of note greater than 35 minutes spent assessing patient-discussing her status with nursing staff-reviewing her chart-  coordinating and formulating a plan of care-of note greater than 50% of time spent coordinating plan of care                   .

## 2015-08-28 ENCOUNTER — Encounter (HOSPITAL_COMMUNITY): Payer: Self-pay | Admitting: Emergency Medicine

## 2015-08-28 ENCOUNTER — Emergency Department (HOSPITAL_COMMUNITY): Payer: Medicare Other

## 2015-08-28 ENCOUNTER — Inpatient Hospital Stay (HOSPITAL_COMMUNITY)
Admission: EM | Admit: 2015-08-28 | Discharge: 2015-09-05 | DRG: 308 | Disposition: A | Discharge status: Skilled Nursing Facility | Payer: Medicare Other | Attending: Family Medicine | Admitting: Family Medicine

## 2015-08-28 DIAGNOSIS — J441 Chronic obstructive pulmonary disease with (acute) exacerbation: Secondary | ICD-10-CM | POA: Diagnosis present

## 2015-08-28 DIAGNOSIS — I110 Hypertensive heart disease with heart failure: Secondary | ICD-10-CM | POA: Diagnosis present

## 2015-08-28 DIAGNOSIS — I5023 Acute on chronic systolic (congestive) heart failure: Secondary | ICD-10-CM | POA: Diagnosis present

## 2015-08-28 DIAGNOSIS — I248 Other forms of acute ischemic heart disease: Secondary | ICD-10-CM

## 2015-08-28 DIAGNOSIS — Z8249 Family history of ischemic heart disease and other diseases of the circulatory system: Secondary | ICD-10-CM

## 2015-08-28 DIAGNOSIS — R0602 Shortness of breath: Secondary | ICD-10-CM

## 2015-08-28 DIAGNOSIS — F419 Anxiety disorder, unspecified: Secondary | ICD-10-CM | POA: Diagnosis present

## 2015-08-28 DIAGNOSIS — I5022 Chronic systolic (congestive) heart failure: Secondary | ICD-10-CM | POA: Diagnosis not present

## 2015-08-28 DIAGNOSIS — F17210 Nicotine dependence, cigarettes, uncomplicated: Secondary | ICD-10-CM | POA: Diagnosis present

## 2015-08-28 DIAGNOSIS — E876 Hypokalemia: Secondary | ICD-10-CM | POA: Diagnosis present

## 2015-08-28 DIAGNOSIS — G8194 Hemiplegia, unspecified affecting left nondominant side: Secondary | ICD-10-CM | POA: Diagnosis present

## 2015-08-28 DIAGNOSIS — I4891 Unspecified atrial fibrillation: Secondary | ICD-10-CM

## 2015-08-28 DIAGNOSIS — Z7984 Long term (current) use of oral hypoglycemic drugs: Secondary | ICD-10-CM | POA: Diagnosis not present

## 2015-08-28 DIAGNOSIS — N390 Urinary tract infection, site not specified: Secondary | ICD-10-CM | POA: Diagnosis present

## 2015-08-28 DIAGNOSIS — D649 Anemia, unspecified: Secondary | ICD-10-CM | POA: Diagnosis present

## 2015-08-28 DIAGNOSIS — Z8673 Personal history of transient ischemic attack (TIA), and cerebral infarction without residual deficits: Secondary | ICD-10-CM

## 2015-08-28 DIAGNOSIS — E785 Hyperlipidemia, unspecified: Secondary | ICD-10-CM | POA: Diagnosis present

## 2015-08-28 DIAGNOSIS — I2489 Other forms of acute ischemic heart disease: Secondary | ICD-10-CM

## 2015-08-28 DIAGNOSIS — I10 Essential (primary) hypertension: Secondary | ICD-10-CM

## 2015-08-28 DIAGNOSIS — Z7902 Long term (current) use of antithrombotics/antiplatelets: Secondary | ICD-10-CM | POA: Diagnosis not present

## 2015-08-28 DIAGNOSIS — I639 Cerebral infarction, unspecified: Secondary | ICD-10-CM

## 2015-08-28 DIAGNOSIS — I480 Paroxysmal atrial fibrillation: Secondary | ICD-10-CM | POA: Diagnosis present

## 2015-08-28 DIAGNOSIS — I5031 Acute diastolic (congestive) heart failure: Secondary | ICD-10-CM | POA: Diagnosis not present

## 2015-08-28 DIAGNOSIS — Z7982 Long term (current) use of aspirin: Secondary | ICD-10-CM | POA: Diagnosis not present

## 2015-08-28 DIAGNOSIS — E119 Type 2 diabetes mellitus without complications: Secondary | ICD-10-CM | POA: Diagnosis present

## 2015-08-28 DIAGNOSIS — Z825 Family history of asthma and other chronic lower respiratory diseases: Secondary | ICD-10-CM | POA: Diagnosis not present

## 2015-08-28 DIAGNOSIS — Z720 Tobacco use: Secondary | ICD-10-CM

## 2015-08-28 DIAGNOSIS — I5033 Acute on chronic diastolic (congestive) heart failure: Secondary | ICD-10-CM | POA: Diagnosis not present

## 2015-08-28 DIAGNOSIS — F0390 Unspecified dementia without behavioral disturbance: Secondary | ICD-10-CM | POA: Diagnosis present

## 2015-08-28 DIAGNOSIS — F05 Delirium due to known physiological condition: Secondary | ICD-10-CM | POA: Diagnosis not present

## 2015-08-28 DIAGNOSIS — N342 Other urethritis: Secondary | ICD-10-CM

## 2015-08-28 HISTORY — DX: Chronic obstructive pulmonary disease, unspecified: J44.9

## 2015-08-28 HISTORY — DX: Cerebral infarction, unspecified: I63.9

## 2015-08-28 HISTORY — DX: Anxiety disorder, unspecified: F41.9

## 2015-08-28 HISTORY — DX: Heart failure, unspecified: I50.9

## 2015-08-28 LAB — I-STAT CG4 LACTIC ACID, ED
LACTIC ACID, VENOUS: 3.07 mmol/L — AB (ref 0.5–2.0)
Lactic Acid, Venous: 1.2 mmol/L (ref 0.5–2.0)

## 2015-08-28 LAB — URINALYSIS, ROUTINE W REFLEX MICROSCOPIC
BILIRUBIN URINE: NEGATIVE
GLUCOSE, UA: NEGATIVE mg/dL
Nitrite: POSITIVE — AB
PROTEIN: 30 mg/dL — AB
Specific Gravity, Urine: 1.025 (ref 1.005–1.030)
pH: 6 (ref 5.0–8.0)

## 2015-08-28 LAB — BASIC METABOLIC PANEL
ANION GAP: 11 (ref 5–15)
BUN: 15 mg/dL (ref 6–20)
CHLORIDE: 97 mmol/L — AB (ref 101–111)
CO2: 33 mmol/L — ABNORMAL HIGH (ref 22–32)
Calcium: 8.3 mg/dL — ABNORMAL LOW (ref 8.9–10.3)
Creatinine, Ser: 0.52 mg/dL (ref 0.44–1.00)
GFR calc non Af Amer: >60 mL/min (ref >60)
Glucose, Bld: 165 mg/dL — ABNORMAL HIGH (ref 65–99)
Potassium: 2.7 mmol/L — CL (ref 3.5–5.1)
SODIUM: 141 mmol/L (ref 135–145)

## 2015-08-28 LAB — TROPONIN I: TROPONIN I: 0.03 ng/mL (ref <0.031)

## 2015-08-28 LAB — CBC WITH DIFFERENTIAL/PLATELET
BASOS ABS: 0 10*3/uL (ref 0.0–0.1)
BASOS PCT: 0 %
EOS ABS: 0 10*3/uL (ref 0.0–0.7)
Eosinophils Relative: 0 %
HEMATOCRIT: 39.6 % (ref 36.0–46.0)
HEMOGLOBIN: 12.5 g/dL (ref 12.0–15.0)
Lymphocytes Relative: 4 %
Lymphs Abs: 0.5 10*3/uL — ABNORMAL LOW (ref 0.7–4.0)
MCH: 26.9 pg (ref 26.0–34.0)
MCHC: 31.6 g/dL (ref 30.0–36.0)
MCV: 85.3 fL (ref 78.0–100.0)
Monocytes Absolute: 0.9 10*3/uL (ref 0.1–1.0)
Monocytes Relative: 6 %
NEUTROS ABS: 13.1 10*3/uL — AB (ref 1.7–7.7)
NEUTROS PCT: 90 %
Platelets: 189 10*3/uL (ref 150–400)
RBC: 4.64 MIL/uL (ref 3.87–5.11)
RDW: 14.9 % (ref 11.5–15.5)
WBC: 14.5 10*3/uL — AB (ref 4.0–10.5)

## 2015-08-28 LAB — URINE MICROSCOPIC-ADD ON

## 2015-08-28 LAB — GLUCOSE, CAPILLARY: GLUCOSE-CAPILLARY: 214 mg/dL — AB (ref 65–99)

## 2015-08-28 LAB — MAGNESIUM: MAGNESIUM: 1.6 mg/dL — AB (ref 1.7–2.4)

## 2015-08-28 LAB — BRAIN NATRIURETIC PEPTIDE: B NATRIURETIC PEPTIDE 5: 131 pg/mL — AB (ref 0.0–100.0)

## 2015-08-28 LAB — PROTIME-INR
INR: 0.93 (ref 0.00–1.49)
Prothrombin Time: 12.7 seconds (ref 11.6–15.2)

## 2015-08-28 LAB — APTT: aPTT: 28 seconds (ref 24–37)

## 2015-08-28 MED ORDER — POTASSIUM CHLORIDE CRYS ER 20 MEQ PO TBCR
40.0000 meq | EXTENDED_RELEASE_TABLET | Freq: Every day | ORAL | Status: DC
  Administered 2015-08-28 – 2015-09-05 (×9): 40 meq via ORAL
  Filled 2015-08-28 (×2): qty 4
  Filled 2015-08-28: qty 2
  Filled 2015-08-28: qty 4
  Filled 2015-08-28 (×4): qty 2
  Filled 2015-08-28: qty 4
  Filled 2015-08-28 (×6): qty 2

## 2015-08-28 MED ORDER — WARFARIN VIDEO
Freq: Once | Status: AC
  Administered 2015-08-28: 22:00:00

## 2015-08-28 MED ORDER — ALBUTEROL SULFATE (2.5 MG/3ML) 0.083% IN NEBU
2.5000 mg | INHALATION_SOLUTION | Freq: Three times a day (TID) | RESPIRATORY_TRACT | Status: DC
  Administered 2015-08-28 – 2015-08-31 (×8): 2.5 mg via RESPIRATORY_TRACT
  Filled 2015-08-28 (×8): qty 3

## 2015-08-28 MED ORDER — PRAVASTATIN SODIUM 40 MG PO TABS
80.0000 mg | ORAL_TABLET | Freq: Every evening | ORAL | Status: DC
  Administered 2015-08-28 – 2015-09-04 (×8): 80 mg via ORAL
  Filled 2015-08-28 (×7): qty 2

## 2015-08-28 MED ORDER — FUROSEMIDE 10 MG/ML IJ SOLN
40.0000 mg | Freq: Every day | INTRAMUSCULAR | Status: DC
  Administered 2015-08-29: 40 mg via INTRAVENOUS
  Filled 2015-08-28: qty 4

## 2015-08-28 MED ORDER — POTASSIUM CHLORIDE CRYS ER 20 MEQ PO TBCR
40.0000 meq | EXTENDED_RELEASE_TABLET | Freq: Once | ORAL | Status: AC
  Administered 2015-08-28: 40 meq via ORAL
  Filled 2015-08-28: qty 2

## 2015-08-28 MED ORDER — HEPARIN BOLUS VIA INFUSION
3000.0000 [IU] | Freq: Once | INTRAVENOUS | Status: AC
  Administered 2015-08-29: 3000 [IU] via INTRAVENOUS

## 2015-08-28 MED ORDER — SODIUM CHLORIDE 0.9% FLUSH
3.0000 mL | Freq: Two times a day (BID) | INTRAVENOUS | Status: DC
Start: 2015-08-28 — End: 2015-09-05
  Administered 2015-08-28 – 2015-09-05 (×13): 3 mL via INTRAVENOUS

## 2015-08-28 MED ORDER — IPRATROPIUM BROMIDE 0.02 % IN SOLN
1.0000 mg | Freq: Once | RESPIRATORY_TRACT | Status: AC
  Administered 2015-08-28: 1 mg via RESPIRATORY_TRACT
  Filled 2015-08-28: qty 5

## 2015-08-28 MED ORDER — DILTIAZEM HCL 100 MG IV SOLR
5.0000 mg/h | INTRAVENOUS | Status: DC
  Administered 2015-08-28 (×2): 5 mg/h via INTRAVENOUS
  Filled 2015-08-28: qty 100

## 2015-08-28 MED ORDER — TRAZODONE HCL 50 MG PO TABS
50.0000 mg | ORAL_TABLET | Freq: Every evening | ORAL | Status: DC | PRN
  Administered 2015-09-01 – 2015-09-02 (×2): 50 mg via ORAL
  Filled 2015-08-28 (×2): qty 1

## 2015-08-28 MED ORDER — INSULIN ASPART 100 UNIT/ML ~~LOC~~ SOLN
0.0000 [IU] | Freq: Three times a day (TID) | SUBCUTANEOUS | Status: DC
  Administered 2015-08-29 – 2015-08-30 (×4): 3 [IU] via SUBCUTANEOUS
  Administered 2015-08-30 – 2015-08-31 (×3): 2 [IU] via SUBCUTANEOUS
  Administered 2015-09-01: 5 [IU] via SUBCUTANEOUS
  Administered 2015-09-01: 3 [IU] via SUBCUTANEOUS
  Administered 2015-09-01: 5 [IU] via SUBCUTANEOUS
  Administered 2015-09-02: 3 [IU] via SUBCUTANEOUS
  Administered 2015-09-02: 8 [IU] via SUBCUTANEOUS
  Administered 2015-09-02 – 2015-09-03 (×2): 5 [IU] via SUBCUTANEOUS
  Administered 2015-09-03 (×2): 8 [IU] via SUBCUTANEOUS
  Administered 2015-09-04 (×2): 11 [IU] via SUBCUTANEOUS
  Administered 2015-09-04: 5 [IU] via SUBCUTANEOUS
  Administered 2015-09-05: 8 [IU] via SUBCUTANEOUS
  Administered 2015-09-05: 3 [IU] via SUBCUTANEOUS

## 2015-08-28 MED ORDER — METHYLPREDNISOLONE SODIUM SUCC 125 MG IJ SOLR
125.0000 mg | Freq: Once | INTRAMUSCULAR | Status: AC
  Administered 2015-08-28: 125 mg via INTRAVENOUS
  Filled 2015-08-28: qty 2

## 2015-08-28 MED ORDER — WARFARIN - PHARMACIST DOSING INPATIENT: Freq: Every day | Status: DC

## 2015-08-28 MED ORDER — HEPARIN (PORCINE) IN NACL 100-0.45 UNIT/ML-% IJ SOLN
1400.0000 [IU]/h | INTRAMUSCULAR | Status: DC
  Administered 2015-08-29: 1000 [IU]/h via INTRAVENOUS
  Administered 2015-08-29: 1050 [IU]/h via INTRAVENOUS
  Administered 2015-08-30: 1300 [IU]/h via INTRAVENOUS
  Filled 2015-08-28 (×3): qty 250

## 2015-08-28 MED ORDER — COUMADIN BOOK
Freq: Once | Status: AC
  Administered 2015-08-29: 11:00:00
  Filled 2015-08-28 (×2): qty 1

## 2015-08-28 MED ORDER — DILTIAZEM HCL 25 MG/5ML IV SOLN
15.0000 mg | Freq: Once | INTRAVENOUS | Status: AC
  Administered 2015-08-28: 15 mg via INTRAVENOUS
  Filled 2015-08-28: qty 5

## 2015-08-28 MED ORDER — SODIUM CHLORIDE 0.9 % IV BOLUS (SEPSIS)
500.0000 mL | Freq: Once | INTRAVENOUS | Status: AC
  Administered 2015-08-28: 500 mL via INTRAVENOUS

## 2015-08-28 MED ORDER — DILTIAZEM LOAD VIA INFUSION
10.0000 mg | Freq: Once | INTRAVENOUS | Status: AC
  Administered 2015-08-28: 10 mg via INTRAVENOUS
  Filled 2015-08-28: qty 10

## 2015-08-28 MED ORDER — ALBUTEROL (5 MG/ML) CONTINUOUS INHALATION SOLN
10.0000 mg/h | INHALATION_SOLUTION | Freq: Once | RESPIRATORY_TRACT | Status: AC
  Administered 2015-08-28: 10 mg/h via RESPIRATORY_TRACT
  Filled 2015-08-28: qty 20

## 2015-08-28 MED ORDER — INSULIN ASPART 100 UNIT/ML ~~LOC~~ SOLN
0.0000 [IU] | Freq: Every day | SUBCUTANEOUS | Status: DC
  Administered 2015-08-28: 2 [IU] via SUBCUTANEOUS
  Administered 2015-08-31: 3 [IU] via SUBCUTANEOUS
  Administered 2015-09-01 – 2015-09-02 (×2): 2 [IU] via SUBCUTANEOUS
  Administered 2015-09-03: 3 [IU] via SUBCUTANEOUS

## 2015-08-28 MED ORDER — WARFARIN SODIUM 7.5 MG PO TABS
7.5000 mg | ORAL_TABLET | Freq: Once | ORAL | Status: AC
  Administered 2015-08-28: 7.5 mg via ORAL
  Filled 2015-08-28: qty 1

## 2015-08-28 MED ORDER — HYDROCODONE-ACETAMINOPHEN 5-325 MG PO TABS
1.0000 | ORAL_TABLET | ORAL | Status: DC | PRN
  Administered 2015-09-05: 1 via ORAL
  Filled 2015-08-28: qty 1

## 2015-08-28 NOTE — ED Notes (Signed)
Pt from [facility], c/o right leg swelling since Friday.  Pt had doppler study done Friday with negative results.  Pt also has a wound from a chair lift that is bandaged at this time.  Pt alert and oriented.

## 2015-08-28 NOTE — ED Notes (Signed)
Called to give report, no answer.

## 2015-08-28 NOTE — ED Notes (Signed)
Resp notified that pt mask fell off and solution spilled.

## 2015-08-28 NOTE — Progress Notes (Addendum)
ANTICOAGULATION CONSULT NOTE - Initial Consult  Pharmacy Consult for heparin and coumadin Indication: atrial fibrillation  No Known Allergies  Patient Measurements: Weight: 153 lb (69.4 kg) Heparin Dosing Weight: 69 kg  Vital Signs: Temp: 98.3 F (36.8 C) (02/05 2134) Temp Source: Oral (02/05 2134) BP: 112/61 mmHg (02/05 2100) Pulse Rate: 97 (02/05 2100)  Labs:  Recent Labs  08/27/15 08/28/15 1740  HGB 10.7* 12.5  HCT 33.7* 39.6  PLT 166 189  CREATININE 0.73 0.52  TROPONINI  --  0.03    Estimated Creatinine Clearance: 61.7 mL/min (by C-G formula based on Cr of 0.52).   Medical History: Past Medical History  Diagnosis Date  . Diabetes mellitus without complication (HCC)   . Hypertension   . Tobacco abuse   . Use of cane as ambulatory aid   . Leg edema, right     chronic  . Stroke (HCC)   . CHF (congestive heart failure) (HCC)   . COPD (chronic obstructive pulmonary disease) (HCC)     on O2  . Anxiety     Medications:  See medication history  Assessment: 70 yo lady with new onset afib to start heparin and coumadin Goal of Therapy:  INR 2-3 Heparin level 0.3-0.7 units/ml Monitor platelets by anticoagulation protocol: Yes   Plan:  Heparin 3000 units bolus and drip at 1000 units/hr Coumadin 7.5 mg po tonight Check heparin level, PT/INR and CBC daily Monitor for bleeding complications  Thanks for allowing pharmacy to be a part of this patient's care.  Talbert Cage, PharmD Clinical Pharmacist  08/28/2015,9:38 PM  Addum:  Heparin level this afternoon 0.25 units/ml.  No problems with infusion per RN.  Will increase drip slightly to 1050 units/hr.  F/u am labs

## 2015-08-28 NOTE — ED Notes (Signed)
CRITICAL VALUE ALERT  Critical value received:  Potassium 2.7   Date of notification: 08/28/15 Time of notification: 0638 Critical value read back:Yes.    Nurse who received alert: Docia Barrier, rn  MD notified (1st page): Dr. Clarene Duke

## 2015-08-28 NOTE — ED Notes (Signed)
Pt informed nurse that she would not accept breathing tx without having something to drink first.  Pt informed that she could have a mouth swab, but nothing to drink.  Pt instructed by nurse and doctor.   Pt given mouth swab.

## 2015-08-28 NOTE — ED Provider Notes (Signed)
CSN: [id number]     Arrival date & time 08/28/15  1629 History   First MD Initiated Contact with Patient 08/28/15 1709     Chief Complaint  Patient presents with  . Leg Swelling      HPI  Pt was seen at 1720. Per EMS, NH report, and pt: c/o gradual onset and persistence of constant "legs swelling" for the past week. NH states pt was evaluated by PMD 2 days ago, rx lasix, and had negative Vasc US RLE. Pt states she "is swollen" and "wheezing." Denies CP, no back pain, no abd pain, no N/V/D, no fevers.    Past Medical History  Diagnosis Date  . Diabetes mellitus without complication (HCC)   . Hypertension   . Tobacco abuse   . Use of cane as ambulatory aid   . Leg edema, right     chronic  . Stroke (HCC)   . CHF (congestive heart failure) (HCC)   . COPD (chronic obstructive pulmonary disease) (HCC)     on O2  . Anxiety    Past Surgical History  Procedure Laterality Date  . Cataract extraction    . Hip surgery    . Foot surgery     Family History  Problem Relation Age of Onset  . Transient ischemic attack Mother   . COPD Father   . Heart disease Brother    Social History  Substance Use Topics  . Smoking status: Current Every Day Smoker    Types: Cigarettes  . Smokeless tobacco: None  . Alcohol Use: No    Review of Systems ROS: Statement: All systems negative except as marked or noted in the HPI; Constitutional: Negative for fever and chills. ; ; Eyes: Negative for eye pain, redness and discharge. ; ; ENMT: Negative for ear pain, hoarseness, nasal congestion, sinus pressure and sore throat. ; ; Cardiovascular: Negative for chest pain, palpitations, diaphoresis, +dyspnea and +peripheral edema. ; ; Respiratory: +wheezing. Negative for stridor. ; ; Gastrointestinal: Negative for nausea, vomiting, diarrhea, abdominal pain, blood in stool, hematemesis, jaundice and rectal bleeding. . ; ; Genitourinary: Negative for dysuria, flank pain and hematuria. ; ; Musculoskeletal: Negative  for back pain and neck pain. Negative for swelling and trauma.; ; Skin: Negative for pruritus, rash, abrasions, blisters, bruising and skin lesion.; ; Neuro: Negative for headache, lightheadedness and neck stiffness. Negative for weakness, altered level of consciousness , altered mental status, extremity weakness, paresthesias, involuntary movement, seizure and syncope.      Allergies  Review of patient's allergies indicates no known allergies.  Home Medications   Prior to Admission medications   Medication Sig Start Date End Date Taking? Authorizing Provider  albuterol (PROVENTIL) (2.5 MG/3ML) 0.083% nebulizer solution Take 2.5 mg by nebulization 3 (three) times daily. *may be given every 2 hours as needed for breathing/shortness of breath   Yes Historical Provider, MD  Amino Acids-Protein Hydrolys (FEEDING SUPPLEMENT, PRO-STAT 64,) LIQD Take 30 mLs by mouth 2 (two) times daily.   Yes Historical Provider, MD  aspirin EC 81 MG tablet Take 81 mg by mouth daily.   Yes Historical Provider, MD  clopidogrel (PLAVIX) 75 MG tablet Take 1 tablet (75 mg total) by mouth daily with breakfast. 06/10/15  Yes Oval Linsey, MD  clotrimazole (LOTRIMIN) 1 % cream Apply 1 application topically 4 (four) times daily as needed (to corners of mouth/lips as needed).   Yes Historical Provider, MD  furosemide (LASIX) 20 MG tablet Take 1 tablet (20 mg total) by mouth  daily. Patient taking differently: Take 40 mg by mouth daily.  06/10/15  Yes Oval Linsey, MD  HYDROcodone-acetaminophen [facility]) 5-325 MG tablet Take one tablet by mouth every 6 hours as needed for pain. Max APAP 3gm/24hrs from all sources 08/11/15  Yes Tiffany L Reed, DO  Melatonin 3 MG TABS Take 3 mg by mouth at bedtime.   Yes Historical Provider, MD  metFORMIN (GLUCOPHAGE) 500 MG tablet Take 500 mg by mouth 2 (two) times daily with a meal.   Yes Historical Provider, MD  NON FORMULARY Take by mouth at bedtime. MAGIC CUP   Yes Historical Provider, MD   potassium chloride (K-DUR) 10 MEQ tablet Take 10 mEq by mouth daily.   Yes Historical Provider, MD  pravastatin (PRAVACHOL) 40 MG tablet Take 2 tablets (80 mg total) by mouth every evening. 06/14/15  Yes Rolly Salter, MD  traZODone (DESYREL) 50 MG tablet Take 1 tablet (50 mg total) by mouth at bedtime as needed for sleep. 06/14/15  Yes Rolly Salter, MD  guaiFENesin (MUCINEX) 600 MG 12 hr tablet Take 1 tablet (600 mg total) by mouth 2 (two) times daily. Patient not taking: Reported on 08/28/2015 06/14/15   Rolly Salter, MD  nystatin (MYCOSTATIN) 100000 UNIT/ML suspension Take 5 mLs (500,000 Units total) by mouth 4 (four) times daily. Patient not taking: Reported on 08/28/2015 06/14/15   Rolly Salter, MD  predniSONE (DELTASONE) 10 MG tablet Take 40 mg for 4 days, then Take 30 mg for 3 days, then Take 20 mg for 3 days, Take 10 mg for 3 days, then stop.40 Patient taking differently: 50 mg.  06/14/15   Rolly Salter, MD  senna-docusate (SENOKOT-S) 8.6-50 MG tablet Take 1 tablet by mouth at bedtime as needed for mild constipation. Patient not taking: Reported on 08/28/2015 06/14/15   Rolly Salter, MD   BP 116/79 mmHg  Pulse 121  Temp(Src) 99.5 F (37.5 C) (Oral)  Resp 22  Wt 153 lb (69.4 kg)  SpO2 91% Physical Exam  1725: Physical examination:  Nursing notes reviewed; Vital signs and O2 SAT reviewed;  Constitutional: Well developed, Well nourished, In no acute distress; Head:  Normocephalic, atraumatic; Eyes: EOMI, PERRL, No scleral icterus; ENMT: Mouth and pharynx normal, Mucous membranes dry; Neck: Supple, Full range of motion, No lymphadenopathy; Cardiovascular: Tachycardic rate and irregular rhythm, No gallop; Respiratory: Breath sounds diminished & equal bilaterally, insp/exp wheezes bilat. No audible wheezing. Speaking sentences, Tachypneic. Sitting upright.; Chest: Nontender, Movement normal; Abdomen: Soft, Nontender, Nondistended, Normal bowel sounds; Genitourinary: No CVA tenderness;  Extremities: Pulses normal, No tenderness, +1 LLE edema, +2 RLE edema..; Neuro: Awake, alert, vague/rambling historian. Speech clear. Moves all extremities spontaneously..; Skin: Color normal, Warm, Dry.   ED Course  Procedures (including critical care time) Labs Review  Imaging Review  I have personally reviewed and evaluated these images and lab results as part of my medical decision-making.   EKG Interpretation   Date/Time:  Sunday August 28 2015 17:11:57 EST Ventricular Rate:  141 PR Interval:    QRS Duration: 85 QT Interval:  258 QTC Calculation: 395 R Axis:   78 Text Interpretation:  Atrial fibrillation Paired ventricular premature  complexes Repolarization abnormality, prob rate related Artifact When  compared with ECG of 06/12/2015 Atrial fibrillation has replaced Normal  sinus rhythm Confirmed by [facility]  MD, Nicholos Johns [phone]) on 08/28/2015  7:19:56 PM      MDM  MDM Reviewed: previous chart, nursing note and vitals Reviewed previous: labs and ECG  Interpretation: labs, ECG and x-ray Total time providing critical care: 30-74 minutes. This excludes time spent performing separately reportable procedures and services. Consults: admitting MD     CRITICAL CARE Performed by: Laray Anger Total critical care time: 35 minutes Critical care time was exclusive of separately billable procedures and treating other patients. Critical care was necessary to treat or prevent imminent or life-threatening deterioration. Critical care was time spent personally by me on the following activities: development of treatment plan with patient and/or surrogate as well as nursing, discussions with consultants, evaluation of patient's response to treatment, examination of patient, obtaining history from patient or surrogate, ordering and performing treatments and interventions, ordering and review of laboratory studies, ordering and review of radiographic studies, pulse oximetry and  re-evaluation of patient's condition.   Results for orders placed or performed during the hospital encounter of 08/28/15  Basic metabolic panel  Result Value Ref Range   Sodium 141 135 - 145 mmol/L   Potassium 2.7 (LL) 3.5 - 5.1 mmol/L   Chloride 97 (L) 101 - 111 mmol/L   CO2 33 (H) 22 - 32 mmol/L   Glucose, Bld 165 (H) 65 - 99 mg/dL   BUN 15 6 - 20 mg/dL   Creatinine, Ser 9.60 0.44 - 1.00 mg/dL   Calcium 8.3 (L) 8.9 - 10.3 mg/dL   GFR calc non Af Amer >60 >60 mL/min   GFR calc Af Amer >60 >60 mL/min   Anion gap 11 5 - 15  Troponin I  Result Value Ref Range   Troponin I 0.03 <0.031 ng/mL  Brain natriuretic peptide  Result Value Ref Range   B Natriuretic Peptide 131.0 (H) 0.0 - 100.0 pg/mL  CBC with Differential  Result Value Ref Range   WBC 14.5 (H) 4.0 - 10.5 K/uL   RBC 4.64 3.87 - 5.11 MIL/uL   Hemoglobin 12.5 12.0 - 15.0 g/dL   HCT 45.4 09.8 - 11.9 %   MCV 85.3 78.0 - 100.0 fL   MCH 26.9 26.0 - 34.0 pg   MCHC 31.6 30.0 - 36.0 g/dL   RDW 14.7 82.9 - 56.2 %   Platelets 189 150 - 400 K/uL   Neutrophils Relative % 90 %   Neutro Abs 13.1 (H) 1.7 - 7.7 K/uL   Lymphocytes Relative 4 %   Lymphs Abs 0.5 (L) 0.7 - 4.0 K/uL   Monocytes Relative 6 %   Monocytes Absolute 0.9 0.1 - 1.0 K/uL   Eosinophils Relative 0 %   Eosinophils Absolute 0.0 0.0 - 0.7 K/uL   Basophils Relative 0 %   Basophils Absolute 0.0 0.0 - 0.1 K/uL  I-Stat CG4 Lactic Acid, ED  Result Value Ref Range   Lactic Acid, Venous 3.07 (HH) 0.5 - 2.0 mmol/L   Comment NOTIFIED PHYSICIAN    US Venous Img Lower Unilateral Right 08/26/2015  CLINICAL DATA:  Right lower extremity pain and swelling for 1 day. EXAM: RIGHT LOWER EXTREMITY VENOUS DOPPLER ULTRASOUND TECHNIQUE: Gray-scale sonography with graded compression, as well as color Doppler and duplex ultrasound were performed to evaluate the lower extremity deep venous systems from the level of the common femoral vein and including the common femoral, femoral, profunda  femoral, popliteal and calf veins including the posterior tibial, peroneal and gastrocnemius veins when visible. The superficial great saphenous vein was also interrogated. Spectral Doppler was utilized to evaluate flow at rest and with distal augmentation maneuvers in the common femoral, femoral and popliteal veins. COMPARISON:  None. FINDINGS: Contralateral Common Femoral Vein:  Respiratory phasicity is normal and symmetric with the symptomatic side. No evidence of thrombus. Normal compressibility. Common Femoral Vein: No evidence of thrombus. Normal compressibility, respiratory phasicity and response to augmentation. Saphenofemoral Junction: No evidence of thrombus. Normal compressibility and flow on color Doppler imaging. Profunda Femoral Vein: No evidence of thrombus. Normal compressibility and flow on color Doppler imaging. Femoral Vein: No evidence of thrombus. Normal compressibility, respiratory phasicity and response to augmentation. Popliteal Vein: No evidence of thrombus. Normal compressibility, respiratory phasicity and response to augmentation. Calf Veins: No evidence of thrombus. Normal compressibility and flow on color Doppler imaging. Superficial Great Saphenous Vein: No evidence of thrombus. Normal compressibility and flow on color Doppler imaging. Venous Reflux:  None. Other Findings:  None. IMPRESSION: No evidence of deep venous thrombosis. Electronically Signed   By: Annia Belt M.D.   On: 08/26/2015 17:47   Dg Chest Port 1 View 08/28/2015  CLINICAL DATA:  Initial evaluation for acute wheezing, leg swelling. EXAM: PORTABLE CHEST 1 VIEW COMPARISON:  Prior study from 08/25/2014. FINDINGS: Examination is somewhat suboptimal due to patient positioning and shallow lung inflation. Cardiac and mediastinal silhouettes grossly stable, and remain within normal limits. Lungs are hypoinflated. Patchy bibasilar opacities, right greater than left, favored to reflect atelectasis. No pulmonary edema. No definite  pleural effusion. No pneumothorax. No DOS abnormality. IMPRESSION: Shallow lung inflation with bibasilar linear opacities, right greater than left. Atelectasis is favored. No other definite active cardiopulmonary disease. Electronically Signed   By: Rise Mu M.D.   On: 08/28/2015 17:54    1915:  New afib/RVR on EKG, HR 150's during my exam: IV cardizem bolus and gtt started. IV cardizem gtt titrated, HR trending downward to 120's. Will re-bolus IV cardizem. Pt with insp/exp wheezing on arrival, tachypneic: IV solumedrol and hour long neb started. Pt has spilled her neb treatment and RT was called to re-start. Potassium repleted PO. Magnesium level pending. Dx and testing d/w pt and family.  Questions answered.  Verb understanding, agreeable to admit. T/C to [facility] Dr. Conley Rolls, case discussed, including:  HPI, pertinent PM/SHx, VS/PE, dx testing, ED course and treatment:  Agreeable to admit, requests he will come to the ED for evaluation.   2015:  HR trending downward after 2nd cardizem bolus and increasing gtt rate: HR now 100's. BP stable.      Samuel Jester, DO 08/31/15 1723

## 2015-08-28 NOTE — H&P (Signed)
[facility] History and Physical  BRITNI DRISCOLL VHQ:[id number] DOB: Nov 09, 1945    PCP:   Isabella Stalling, MD   Chief Complaint: Leg swelling  HPI: Tara Park is an 70 y.o. female with a hx of DM, tobacco abuse, COPD, HTN, and CHF that presents from the [facility] with right leg swelling that began 2 days ago. Patient had a venous doppler performed on 08/26/15 which was negative for a DVT. She denies any redness, numbness, tingling, or weakness. She also reports mild SOB.  While in the ED patient was noted to be in atrial fibrillation with RVR. She denies any hx of similar and is not having any palpitations or CP. Labs revealed a potassium of 2.7, BNP 131, glucose 165, WBC 14.5, and lactic acid of 3.07. CXR was consistent with atelectasis. Hospitalist was asked to admit her for further management of atrial fibrillation with RVR.   Rewiew of Systems:  Constitutional: Negative for malaise, fever and chills. No significant weight loss or weight gain Eyes: Negative for eye pain, redness and discharge, diplopia, visual changes, or flashes of light. ENMT: Negative for ear pain, hoarseness, nasal congestion, sinus pressure and sore throat. No headaches; tinnitus, drooling, or problem swallowing. Cardiovascular: Negative for chest pain, palpitations, diaphoresis, dyspnea and peripheral edema. ; No orthopnea, PND Respiratory: Positive for SOB. Negative for cough, hemoptysis, wheezing and stridor. No pleuritic chestpain. Gastrointestinal: Negative for nausea, vomiting, diarrhea, constipation, abdominal pain, melena, blood in stool, hematemesis, jaundice and rectal bleeding.    Genitourinary: Negative for frequency, dysuria, incontinence,flank pain and hematuria; Musculoskeletal: Negative for back pain and neck pain. Negative for trauma.; Positive for RLE swelling.  Skin: . Negative for pruritus, rash, abrasions, bruising and skin lesion.; ulcerations Neuro: Negative for headache,  lightheadedness and neck stiffness. Negative for weakness, altered level of consciousness , altered mental status, extremity weakness, burning feet, involuntary movement, seizure and syncope.  Psych: negative for anxiety, depression, insomnia, tearfulness, panic attacks, hallucinations, paranoia, suicidal or homicidal ideation     Past Medical History  Diagnosis Date  . Diabetes mellitus without complication (HCC)   . Hypertension   . Tobacco abuse   . Use of cane as ambulatory aid   . Leg edema, right     chronic  . Stroke (HCC)   . CHF (congestive heart failure) (HCC)   . COPD (chronic obstructive pulmonary disease) (HCC)     on O2  . Anxiety     Past Surgical History  Procedure Laterality Date  . Cataract extraction    . Hip surgery    . Foot surgery      Medications:  HOME MEDS: Prior to Admission medications   Medication Sig Start Date End Date Taking? Authorizing Provider  albuterol (PROVENTIL) (2.5 MG/3ML) 0.083% nebulizer solution Take 2.5 mg by nebulization 3 (three) times daily. *may be given every 2 hours as needed for breathing/shortness of breath   Yes Historical Provider, MD  Amino Acids-Protein Hydrolys (FEEDING SUPPLEMENT, PRO-STAT 64,) LIQD Take 30 mLs by mouth 2 (two) times daily.   Yes Historical Provider, MD  aspirin EC 81 MG tablet Take 81 mg by mouth daily.   Yes Historical Provider, MD  clopidogrel (PLAVIX) 75 MG tablet Take 1 tablet (75 mg total) by mouth daily with breakfast. 06/10/15  Yes Oval Linsey, MD  clotrimazole (LOTRIMIN) 1 % cream Apply 1 application topically 4 (four) times daily as needed (to corners of mouth/lips as needed).   Yes Historical Provider, MD  furosemide (LASIX)  20 MG tablet Take 1 tablet (20 mg total) by mouth daily. Patient taking differently: Take 40 mg by mouth daily.  06/10/15  Yes Oval Linsey, MD  HYDROcodone-acetaminophen [employer]) 5-325 MG tablet Take one tablet by mouth every 6 hours as needed for pain. Max APAP  3gm/24hrs from all sources 08/11/15  Yes Tiffany L Reed, DO  Melatonin 3 MG TABS Take 3 mg by mouth at bedtime.   Yes Historical Provider, MD  metFORMIN (GLUCOPHAGE) 500 MG tablet Take 500 mg by mouth 2 (two) times daily with a meal.   Yes Historical Provider, MD  NON FORMULARY Take by mouth at bedtime. MAGIC CUP   Yes Historical Provider, MD  potassium chloride (K-DUR) 10 MEQ tablet Take 10 mEq by mouth daily.   Yes Historical Provider, MD  pravastatin (PRAVACHOL) 40 MG tablet Take 2 tablets (80 mg total) by mouth every evening. 06/14/15  Yes Rolly Salter, MD  traZODone (DESYREL) 50 MG tablet Take 1 tablet (50 mg total) by mouth at bedtime as needed for sleep. 06/14/15  Yes Rolly Salter, MD  guaiFENesin (MUCINEX) 600 MG 12 hr tablet Take 1 tablet (600 mg total) by mouth 2 (two) times daily. Patient not taking: Reported on 08/28/2015 06/14/15   Rolly Salter, MD  nystatin (MYCOSTATIN) 100000 UNIT/ML suspension Take 5 mLs (500,000 Units total) by mouth 4 (four) times daily. Patient not taking: Reported on 08/28/2015 06/14/15   Rolly Salter, MD  predniSONE (DELTASONE) 10 MG tablet Take 40 mg for 4 days, then Take 30 mg for 3 days, then Take 20 mg for 3 days, Take 10 mg for 3 days, then stop.40 Patient taking differently: 50 mg.  06/14/15   Rolly Salter, MD  senna-docusate (SENOKOT-S) 8.6-50 MG tablet Take 1 tablet by mouth at bedtime as needed for mild constipation. Patient not taking: Reported on 08/28/2015 06/14/15   Rolly Salter, MD     Allergies:  No Known Allergies  Social History:   reports that she has been smoking Cigarettes.  She does not have any smokeless tobacco history on file. She reports that she does not drink alcohol or use illicit drugs.  Family History: Family History  Problem Relation Age of Onset  . Transient ischemic attack Mother   . COPD Father   . Heart disease Brother      Physical Exam: Filed Vitals:   08/28/15 1746 08/28/15 1800 08/28/15 1821 08/28/15  1900  BP:  134/82  116/79  Pulse:  72  121  Temp:      TempSrc:      Resp: 26 29  22   Weight:      SpO2:  95% 92% 91%   Blood pressure 116/79, pulse 121, temperature 99.5 F (37.5 C), temperature source Oral, resp. rate 22, weight 69.4 kg (153 lb), SpO2 91 %.  GEN:  Pleasant. Patient lying in the stretcher in no acute distress; cooperative with exam. PSYCH:  alert and oriented x4; does not appear anxious or depressed; affect is appropriate. HEENT: Mucous membranes pink and anicteric; PERRLA; EOM intact; no cervical lymphadenopathy nor thyromegaly or carotid bruit; no JVD; There were no stridor. Neck is very supple. Breasts:: Not examined CHEST WALL: No tenderness CHEST: Normal respiration, clear to auscultation bilaterally.  HEART: Regular rate and rhythm.  There are no murmur, rub, or gallops.   BACK: No kyphosis or scoliosis; no CVA tenderness ABDOMEN: soft and non-tender; no masses, no organomegaly, normal abdominal bowel sounds; no pannus; no  intertriginous candida. There is no rebound and no distention. Rectal Exam: Not done EXTREMITIES: No bone or joint deformity; age-appropriate arthropathy of the hands and knees; no edema; no ulcerations.  There is no calf tenderness. Genitalia: not examined PULSES: 2+ and symmetric SKIN: Normal hydration no rash or ulceration CNS: Cranial nerves 2-12 grossly intact no focal lateralizing neurologic deficit.  Speech is fluent; uvula elevated with phonation, facial symmetry and tongue midline. DTR are normal bilaterally, cerebella exam is intact, barbinski is negative and strengths are equaled bilaterally.  No sensory loss.   Labs on Admission:  Basic Metabolic Panel:  Recent Labs Lab 08/27/15 08/28/15 1740  NA 142 141  K 4.1 2.7*  CL 98* 97*  CO2 33* 33*  GLUCOSE 104* 165*  BUN 15 15  CREATININE 0.73 0.52  CALCIUM 8.5* 8.3*    CBC:  Recent Labs Lab 08/27/15 08/28/15 1740  WBC 9.2 14.5*  NEUTROABS 7.8* 13.1*  HGB 10.7* 12.5   HCT 33.7* 39.6  MCV 86.2 85.3  PLT 166 189   Cardiac Enzymes:  Recent Labs Lab 08/28/15 1740  TROPONINI 0.03     Radiological Exams on Admission: Dg Chest Port 1 View  08/28/2015  CLINICAL DATA:  Initial evaluation for acute wheezing, leg swelling. EXAM: PORTABLE CHEST 1 VIEW COMPARISON:  Prior study from 08/25/2014. FINDINGS: Examination is somewhat suboptimal due to patient positioning and shallow lung inflation. Cardiac and mediastinal silhouettes grossly stable, and remain within normal limits. Lungs are hypoinflated. Patchy bibasilar opacities, right greater than left, favored to reflect atelectasis. No pulmonary edema. No definite pleural effusion. No pneumothorax. No DOS abnormality. IMPRESSION: Shallow lung inflation with bibasilar linear opacities, right greater than left. Atelectasis is favored. No other definite active cardiopulmonary disease. Electronically Signed   By: Rise Mu M.D.   On: 08/28/2015 17:54    EKG: Independently reviewed.     Assessment/Plan  Afib with RVR, new onset. HTN DM Prior CVA Hypokalemia    PLAN: Patient is being admitted for new onset atrial fibrillation with RVR and acute on chronic systolic CHF. For her afib with RVR, we will continue her on the Cardizem drip and attempt to transition to oral Cardizem tomorrow. We will give IV Heparin for anticoagulation and bridge coumadin at this time.  Her CHADS is at least a 5. We will obtain cardiac ECHO and consult cardiology. We discussed risks and benefits of Eliquis, Pradaxa, and Coumadin. After much discussion, patient has requested to be placed on Coumadin upon discharge for anticoagulation.  For her acute on chronic CHF, we will start on IV Lasix and monitor I&Os. ECHO will be ordered for the a.m.We will hold her Metformin, and use SSI will be added for diabetic control. Will continue her pravastatin for HLD.  Thank you and Good Day.   Other plans as per orders. Code Status: Full     Houston Siren. MD. FACP [facility] Pager [phone] 7pm to 7am.  08/28/2015, 7:17 PM  By signing my name below, I, Burnett Harry, attest that this documentation has been prepared under the direction and in the presence of Houston Siren. MD Electronically Signed: Burnett Harry, Scribe. 08/28/2015

## 2015-08-29 ENCOUNTER — Ambulatory Visit: Payer: Medicare Other | Admitting: Neurology

## 2015-08-29 ENCOUNTER — Inpatient Hospital Stay (HOSPITAL_COMMUNITY): Payer: Medicare Other

## 2015-08-29 DIAGNOSIS — Z7189 Other specified counseling: Secondary | ICD-10-CM

## 2015-08-29 DIAGNOSIS — I4891 Unspecified atrial fibrillation: Secondary | ICD-10-CM

## 2015-08-29 DIAGNOSIS — M7989 Other specified soft tissue disorders: Secondary | ICD-10-CM

## 2015-08-29 DIAGNOSIS — Z720 Tobacco use: Secondary | ICD-10-CM

## 2015-08-29 DIAGNOSIS — I429 Cardiomyopathy, unspecified: Secondary | ICD-10-CM

## 2015-08-29 DIAGNOSIS — I9589 Other hypotension: Secondary | ICD-10-CM

## 2015-08-29 DIAGNOSIS — E876 Hypokalemia: Secondary | ICD-10-CM

## 2015-08-29 DIAGNOSIS — I5033 Acute on chronic diastolic (congestive) heart failure: Secondary | ICD-10-CM

## 2015-08-29 DIAGNOSIS — I10 Essential (primary) hypertension: Secondary | ICD-10-CM

## 2015-08-29 DIAGNOSIS — J441 Chronic obstructive pulmonary disease with (acute) exacerbation: Secondary | ICD-10-CM

## 2015-08-29 LAB — MAGNESIUM: Magnesium: 1.7 mg/dL (ref 1.7–2.4)

## 2015-08-29 LAB — CBC
HCT: 30.7 % — ABNORMAL LOW (ref 36.0–46.0)
Hemoglobin: 9.8 g/dL — ABNORMAL LOW (ref 12.0–15.0)
MCH: 27.1 pg (ref 26.0–34.0)
MCHC: 31.9 g/dL (ref 30.0–36.0)
MCV: 84.8 fL (ref 78.0–100.0)
PLATELETS: 151 10*3/uL (ref 150–400)
RBC: 3.62 MIL/uL — ABNORMAL LOW (ref 3.87–5.11)
RDW: 15.1 % (ref 11.5–15.5)
WBC: 6.9 10*3/uL (ref 4.0–10.5)

## 2015-08-29 LAB — PROTIME-INR
INR: 1.17 (ref 0.00–1.49)
PROTHROMBIN TIME: 15.1 s (ref 11.6–15.2)

## 2015-08-29 LAB — HEPARIN LEVEL (UNFRACTIONATED)
HEPARIN UNFRACTIONATED: 0.42 [IU]/mL (ref 0.30–0.70)
HEPARIN UNFRACTIONATED: 0.25 [IU]/mL — AB (ref 0.30–0.70)

## 2015-08-29 LAB — GLUCOSE, CAPILLARY
GLUCOSE-CAPILLARY: 155 mg/dL — AB (ref 65–99)
GLUCOSE-CAPILLARY: 108 mg/dL — AB (ref 65–99)
GLUCOSE-CAPILLARY: 154 mg/dL — AB (ref 65–99)
Glucose-Capillary: 185 mg/dL — ABNORMAL HIGH (ref 65–99)

## 2015-08-29 LAB — MRSA PCR SCREENING: MRSA BY PCR: NEGATIVE

## 2015-08-29 LAB — TROPONIN I: Troponin I: <0.03 ng/mL (ref <0.031)

## 2015-08-29 LAB — TSH: TSH: 0.66 u[IU]/mL (ref 0.350–4.500)

## 2015-08-29 MED ORDER — FUROSEMIDE 20 MG PO TABS
20.0000 mg | ORAL_TABLET | Freq: Every day | ORAL | Status: DC
  Administered 2015-08-30 – 2015-09-05 (×6): 20 mg via ORAL
  Filled 2015-08-29 (×7): qty 1

## 2015-08-29 MED ORDER — RESOURCE THICKENUP CLEAR PO POWD
ORAL | Status: DC | PRN
  Administered 2015-08-29: 1 via ORAL
  Filled 2015-08-29 (×3): qty 125

## 2015-08-29 MED ORDER — WARFARIN SODIUM 5 MG PO TABS
5.0000 mg | ORAL_TABLET | Freq: Once | ORAL | Status: AC
  Administered 2015-08-29: 5 mg via ORAL
  Filled 2015-08-29: qty 1

## 2015-08-29 MED ORDER — DIGOXIN 125 MCG PO TABS
0.1250 mg | ORAL_TABLET | Freq: Every day | ORAL | Status: DC
  Administered 2015-08-29 – 2015-09-05 (×8): 0.125 mg via ORAL
  Filled 2015-08-29 (×8): qty 1

## 2015-08-29 MED ORDER — SODIUM CHLORIDE 0.9 % IV BOLUS (SEPSIS)
1000.0000 mL | Freq: Once | INTRAVENOUS | Status: AC
  Administered 2015-08-29: 1000 mL via INTRAVENOUS

## 2015-08-29 NOTE — [place] FL2 (Signed)
[place] MEDICAID FL2 LEVEL OF CARE SCREENING TOOL     IDENTIFICATION  Patient Name: Tara Park Birthdate: 08-19-1945 Sex: female Admission Date (Current Location): 08/28/2015  [facility] and [place] Number:  [employer] and Address:  [facility],  [address], [place] [address]      Provider Number: [id number]  Attending Physician Name and Address:  Oval Linsey, MD  Relative Name and Phone Number:       Current Level of Care: Hospital Recommended Level of Care: Skilled Nursing Facility Prior Approval Number:    Date Approved/Denied:   PASRR Number:  ( [id number] )  Discharge Plan: SNF    Current Diagnoses: Patient Active Problem List   Diagnosis Date Noted  . New onset a-fib (HCC) 08/28/2015  . A-fib (HCC) 08/28/2015  . Weight gain 08/27/2015  . Edema 08/27/2015  . Subacute confusional state 08/02/2015  . Palpitations 06/14/2015  . Oral thrush 06/14/2015  . UTI (urinary tract infection) 06/13/2015  . Drowsiness 06/12/2015  . Aspiration pneumonia (HCC) 06/11/2015  . Demand ischemia (HCC) 06/11/2015  . Hypokalemia 06/11/2015  . Chronic systolic CHF (congestive heart failure) (HCC) 06/11/2015  . 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 COPD (chronic obstructive pulmonary disease) (HCC) 06/11/2015  . Chronic hypoxemic respiratory failure (HCC) 06/11/2015  . Acute ischemic stroke (HCC) 05/31/2015  . COPD exacerbation (HCC) 05/31/2015  . Tobacco abuse   . Diabetes mellitus without complication (HCC)   . Hypertension   . BURSITIS, HIP 09/09/2007  . TRIGGER FINGER 09/09/2007  . FRACTURE, FEMUR, INTERTROCHANTERIC REGION 09/09/2007  . DIABETES 08/01/2007    Orientation RESPIRATION BLADDER Height & Weight     Self, Time, Situation, Place  O2 (3L) Incontinent Weight: 151 lb 10.8 oz (68.8 kg) Height:   (167.6 cm)  BEHAVIORAL SYMPTOMS/MOOD NEUROLOGICAL BOWEL NUTRITION STATUS      Incontinent Diet (Carb Modified.  Fluid Consistency Honey Thick)  AMBULATORY STATUS COMMUNICATION OF NEEDS Skin   Total Care Verbally Normal                       Personal Care Assistance Level of Assistance  Bathing, Feeding, Dressing Bathing Assistance: Maximum assistance Feeding assistance: Maximum assistance Dressing Assistance: Maximum assistance     Functional Limitations Info             SPECIAL CARE FACTORS FREQUENCY                       Contractures      Additional Factors Info  Isolation Precautions, Psychotropic, Insulin Sliding Scale     Psychotropic Info:  (Trazadone) Insulin Sliding Scale Info:  (Daily) Isolation Precautions Info:  (07/11/15 Wound cultures X 2 grew MRSA )     Current Medications (08/29/2015):  This is the current hospital active medication list Current Facility-Administered Medications  Medication Dose Route Frequency Provider Last Rate Last Dose  . albuterol (PROVENTIL) (2.5 MG/3ML) 0.083% nebulizer solution 2.5 mg  2.5 mg Nebulization TID Houston Siren, MD   2.5 mg at 08/29/15 1629  . digoxin (LANOXIN) tablet 0.125 mg  0.125 mg Oral Daily Jodelle Gross, NP   0.125 mg at 08/29/15 1242  . [START ON 08/30/2015] furosemide (LASIX) tablet 20 mg  20 mg Oral Daily Laqueta Linden, MD      . heparin ADULT infusion 100 units/mL (25000 units/250 mL)  1,050 Units/hr Intravenous Continuous Oval Linsey, MD 10.5 mL/hr at 08/29/15  1450 1,050 Units/hr at 08/29/15 1450  . HYDROcodone-acetaminophen (NORCO/VICODIN) 5-325 MG per tablet 1 tablet  1 tablet Oral Q4H PRN Houston Siren, MD      . insulin aspart (novoLOG) injection 0-15 Units  0-15 Units Subcutaneous TID WC Houston Siren, MD   3 Units at 08/29/15 1235  . insulin aspart (novoLOG) injection 0-5 Units  0-5 Units Subcutaneous QHS Houston Siren, MD   2 Units at 08/28/15 2352  . potassium chloride SA (K-DUR,KLOR-CON) CR tablet 40 mEq  40 mEq Oral Daily Houston Siren, MD   40 mEq at 08/29/15 0921  . pravastatin (PRAVACHOL) tablet 80 mg  80  mg Oral QPM Houston Siren, MD   80 mg at 08/28/15 2353  . RESOURCE THICKENUP CLEAR   Oral PRN Oval Linsey, MD   1 Can at 08/29/15 [phone]  . sodium chloride flush (NS) 0.9 % injection 3 mL  3 mL Intravenous Q12H Houston Siren, MD   3 mL at 08/29/15 1045  . traZODone (DESYREL) tablet 50 mg  50 mg Oral QHS PRN Houston Siren, MD      . Warfarin - Pharmacist Dosing Inpatient   Does not apply [id number] Oval Linsey, MD         Discharge Medications: Please see discharge summary for a list of discharge medications.  Relevant Imaging Results:  Relevant Lab Results:   Additional Information    Dequavion Follette, Juleen China, LCSW

## 2015-08-29 NOTE — Consult Note (Signed)
CARDIOLOGY CONSULT NOTE   Patient ID: Tara Park MRN: [id number] DOB/AGE: [birth year] 70 y.o.  Admit Date: 08/28/2015 Referring Physician: Delbert Harness, Richard MD  Primary Physician: Isabella Stalling, MD Consulting Cardiologist: Prentice Docker, MD Primary Cardiologist: New  Reason for Consultation: Atrial fib with RVR.   Clinical Summary Tara Park is a 70 y.o.female with known history of diabetes, tobacco abuse, COPD, who presented to the ER with complaints of leg swelling, COPD exacerbation, and found to be in atrial fibrillation with RVR, from the [facility] ([facility] 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 She was seen by neurology, Dr. Gerilyn Pilgrim at that time, with dysarthria and left upper extremity weakness. She was started on ASA and Plavix. She had evidence of CHF also during November admission. She was diuresed and started on digoxin, and prednisone. She apparently had an open sore on the front of her right leg that was being treated as OP.   Echocardiogram on 06/02/2015 she was found to have a significantly reduced EF of 25%-30% with akinesis of the apical lateral, septal, anterior, inferior and apical myocardium.. 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 She has left sided hemiparesis and mild dementia. She was seen by neurology and continued on ASA and Plavix  Follow up echocardiogram 10 days later demonstrated normalization of LV fx to 75%. Normal wall motion and grade one diastolic dysfunction.    She states that her leg has been swelling for months, and this is confirmed by her son. It began to swell more so prompting ER evaluation.   On arrival BP 150/80, HR 114, O2 Sat 98%, R 22, low grade fever of 99.5.  Lactic Acid level of 3.07.She was found to be hypokalemic with potassium of 2.7, BNP 131. Mildly elevated WBC at 14.5. Magnesium of  1.6. She was treated with albuterol inhaler, IV solumedrol and and started on a diliatazem gtt due to EKG demonstrating atrial fib with RVR rate of 141 bpm. She had potassium replaced.  She was ruled out for DVT on 08/26/2015. Repeat echocardiogram was completed.   Currently she is off diltiazem gtt, (turned off due to hypotension) has been started on coumadin per pharmacy, and has converted to NSR with PAC's and remains tachycardic in the 90's and is not on rate control at this time. She remains on heparin gtt.     No Known Allergies  Medications Scheduled Medications: . albuterol  2.5 mg Nebulization TID  . coumadin book   Does not apply Once  . furosemide  40 mg Intravenous Daily  . insulin aspart  0-15 Units Subcutaneous TID WC  . insulin aspart  0-5 Units Subcutaneous QHS  . potassium chloride SA  40 mEq Oral Daily  . pravastatin  80 mg Oral QPM  . sodium chloride flush  3 mL Intravenous Q12H  . Warfarin - Pharmacist Dosing Inpatient   Does not apply q1800    Infusions: . diltiazem (CARDIZEM) infusion Stopped (08/28/15 2330)  . heparin 1,000 Units/hr (08/29/15 1100)    PRN Medications: HYDROcodone-acetaminophen, RESOURCE THICKENUP CLEAR, traZODone   Past Medical History  Diagnosis Date  . Diabetes mellitus without complication (HCC)   . Hypertension   . Tobacco abuse   . Use of cane as ambulatory aid   . Leg edema, right     chronic  . Stroke (HCC)   . CHF (congestive heart  failure) (HCC)   . COPD (chronic obstructive pulmonary disease) (HCC)     on O2  . Anxiety     Past Surgical History  Procedure Laterality Date  . Cataract extraction    . Hip surgery    . Foot surgery      Family History  Problem Relation Age of Onset  . Transient ischemic attack Mother   . COPD Father   . Heart disease Brother     Social History Tara Park reports that she has been smoking Cigarettes.  She does not have any smokeless tobacco history on file. Tara Park reports  that she does not drink alcohol.  Review of Systems Complete review of systems are found to be negative unless outlined in H&P above.  Physical Examination Blood pressure 124/104, pulse 99, temperature 97.8 F (36.6 C), temperature source Oral, resp. rate 20, height 5\' 6"  (1.676 m), weight 151 lb 10.8 oz (68.8 kg), SpO2 100 %.  Intake/Output Summary (Last 24 hours) at 08/29/15 1132 Last data filed at 08/29/15 1100  Gross per 24 hour  Intake 849.17 ml  Output      0 ml  Net 849.17 ml    Telemetry: Sinus tachycardia, rates in the 90's.   GEN: No acute distress, mild dementia.  HEENT: Conjunctiva and lids normal, oropharynx clear with moist mucosa. Neck: Supple, no elevated JVP or carotid bruits, no thyromegaly. Lungs: Bilateral crackles to the middle lobes. Occasional coughing.  Cardiac: Regular rate and rhythm, tachycardic with occasional extra systole,. Soft systolic murmur,  S3 or significant systolic murmur, no pericardial rub. Abdomen: Soft, nontender, no hepatomegaly, bowel sounds present, no guarding or rebound. Extremities: Bilateral pitting pre-tibial edema with R>L, with bandage to the pretibial area of the right leg, diminished pulses but palpable.  Skin: Warm and dry. Musculoskeletal: No kyphosis. Contracture of the left arm and hand, into a fist.  Neuropsychiatric: Alert and oriented x2,  affect grossly appropriate.  Prior Cardiac Testing/Procedures 1. Echocardiogram 06/02/2015 Left ventricle: The cavity size was normal. Wall thickness was normal. Systolic function was severely reduced. The estimated ejection fraction was in the range of 25% to 30%. There is akinesis of the apicalanteroseptal, anterior, inferior, and apical myocardium. Findings are suggestive of an ischemic cardiomyopathy, although distribution of wall motion abnormalities could be consistent with stress-induced (Tako-tsubo) cardiomyopathy as well. There is stranding  and trabeculation at the LV apex - no clearly formed mural thrombus, but evidence of relative stasis by microbubble contrast. This could be a potential source of embolic stroke. The study is not technically sufficient to allow evaluation of LV diastolic function. - Aortic valve: Poorly visualized. Mildly calcified leaflets. - Mitral valve: Calcified annulus. There was trivial regurgitation. - Left atrium: The atrium was at the upper limits of normal in size. - Right atrium: Central venous pressure (est): 8 mm Hg. - Atrial septum: A patent foramen ovale cannot be excluded. - Tricuspid valve: There was trivial regurgitation. - Pericardium, extracardiac: There was no pericardial effusion.  Echocardiogram 06/12/2015 Left ventricle: The cavity size was normal. Systolic function was vigorous. The estimated ejection fraction was in the range of 70% to 75%. Wall motion was normal; there were no regional wall motion abnormalities. Doppler parameters are consistent with abnormal left ventricular relaxation (grade 1 diastolic dysfunction). - Mitral valve: Moderately calcified annulus.  Lab Results  Basic Metabolic Panel:  Recent Labs Lab 08/27/15 08/28/15 1740 08/29/15 0731  NA 142 141  --   K 4.1 2.7*  --  CL 98* 97*  --   CO2 33* 33*  --   GLUCOSE 104* 165*  --   BUN 15 15  --   CREATININE 0.73 0.52  --   CALCIUM 8.5* 8.3*  --   MG  --  1.6* 1.7    CBC:  Recent Labs Lab 08/27/15 08/28/15 1740 08/29/15 0527  WBC 9.2 14.5* 6.9  NEUTROABS 7.8* 13.1*  --   HGB 10.7* 12.5 9.8*  HCT 33.7* 39.6 30.7*  MCV 86.2 85.3 84.8  PLT 166 189 151    Cardiac Enzymes:  Recent Labs Lab 08/28/15 1740 08/29/15 0731  TROPONINI 0.03 <0.03    Radiology: Dg Chest Port 1 View  08/28/2015  CLINICAL DATA:  Initial evaluation for acute wheezing, leg swelling. EXAM: PORTABLE CHEST 1 VIEW COMPARISON:  Prior study from 08/25/2014. FINDINGS: Examination is somewhat  suboptimal due to patient positioning and shallow lung inflation. Cardiac and mediastinal silhouettes grossly stable, and remain within normal limits. Lungs are hypoinflated. Patchy bibasilar opacities, right greater than left, favored to reflect atelectasis. No pulmonary edema. No definite pleural effusion. No pneumothorax. No DOS abnormality. IMPRESSION: Shallow lung inflation with bibasilar linear opacities, right greater than left. Atelectasis is favored. No other definite active cardiopulmonary disease. Electronically Signed   By: Rise Mu M.D.   On: 08/28/2015 17:54     ECG: Initial EKG on admission Atrial fib with RVR rate of 141 Repeat EKG 0028, NSR with PAC's Rate of 94 bpm.    Impression and Recommendations 1. Atrial fib with RVR: Now converted to NSR after diltiazem infusion. She became hypotensive when she converted to NSR last night and diltiazem gtt was discontinued. HR is not controlled currently. ad been on digoxin in the past, but do not see this on home medication list. Creatinine is 0.52 per labs. Will restart digoxin at -.125mg  daily. Will not restart CCB until EF is confirmed.   CHADS VASC Score of 5. Started on coumadin per pharmacy. She is basically bed ridden and does not undergo PT at the [facility]. Repeat echo is ordered. She has been taken off of ASA and Plavix.   2. Hx of CVA: Atrial fib my be etiology and not found n last work up as it may be PAF, in the setting of COPD exacerbation. She has significant left sides hemiparesis and is very weak. Some dementia is noted, uncertain if a result of the CVA.  3. Left Leg wound: Consider ABI's to evaluate for PAD. Defer to PCP  4. Hypertension: Normalized now that she is in NSR. Will watch closely.   5.Anemia: Significant decrease from Hgb of 12.5 to 9.8 this am. Question dilutional as she was given fluid bolus due to hypotension. Would repeat and check hemoccult.   7. Hypokalemia: Replaced.   Signed: Bettey Mare. Lawrence NP [facility]  08/29/2015, 11:32 AM Co-Sign MD  The patient was seen and examined, and I agree with the assessment and plan as documented above, with modifications as noted below.  Pt admitted with right leg swelling, shortness of breath, and rapid atrial fibrillation who has since converted to sinus rhythm with PAC's with diltiazem (since discontinued due to hypotension). Has a prior h/o stroke. Chest xray showed atelectasis. BNP only 131. Lower extremity Dopplers showed no evidence of DVT. TSH and troponins normal.  Has been anticoagulated with heparin and warfarin as per patient preference, and it appears direct oral anticoagulants were discussed. INR subtherapeutic at 1.17 today. At home had been on  ASA and Plavix.  Hgb 9.8.  Echocardiogram on 06/12/15 showed vigorous LV systolic function, EF 70-75%, with grade 1 diastolic dysfunction and normal left atrial size. This was a marked change from 06/02/15 echo which showed severe LV dysfunction, perhaps consistent with Takotsubo cardiomyopathy. LV was trabeculated with stranding as noted above.  Recommendations: Agree with warfarin for anticoagulation given CHA2DS2Vasc score of 7. Will switch IV Lasix to oral 20 mg daily. Could consider resuming ASA 81 mg given prior h/o stroke but would favor monitoring Hgb given warfarin initiation. Would not resume Plavix if doing so.  Agree with digoxin for rate control. Could also considering adding low dose metoprolol tartrate 12.5 mg bid but would continue to monitor BP first.  Will follow up on echocardiogram.  Prentice Docker, MD, [facility]  08/29/2015 1:03 pm

## 2015-08-29 NOTE — Progress Notes (Signed)
L. Harduk,NP is notified about patient's low BP 89/45. Cardiem drip stopped. Order received to give NS bolus. Bolus is given slowly due to patient's CHF. ECG is done to confirm pt's NSR. Continue to monitor the pt.

## 2015-08-29 NOTE — Progress Notes (Signed)
Dr. Conley Rolls was notified about pt's low BP 89/53(66). Patient is currently asymptomatic. Continue to monitor the patient. No new orders received at this time.

## 2015-08-29 NOTE — Progress Notes (Signed)
Patient admitted with A. fib with RVR currently in sinus rhythm hemodynamically stable we will obtain cardiology consultation thyroid function analysis and 2-D echocardiogram 4 chamber dimensions and contractility BIRDIE FETTY ZOX:[id number] DOB: [birth year] DOA: 08/28/2015 PCP: Isabella Stalling, MD             Physical Exam: Blood pressure 114/63, pulse 66, temperature 97.6 F (36.4 C), temperature source Oral, resp. rate 18, height  (1.676 m), weight 151 lb 10.8 oz (68.8 kg), SpO2 100 %. lungs clear to A&P no rales wheeze rhonchi heart regular rhythm no S3-S4 no heaves thrills rubs abdomen soft nontender bowel sounds normoactive   Investigations:  Recent Results (from the past 240 hour(s))  MRSA PCR Screening     Status: None   Collection Time: 08/28/15 10:40 PM  Result Value Ref Range Status   MRSA by PCR NEGATIVE NEGATIVE Final    Comment:        The GeneXpert MRSA Assay (FDA approved for NASAL specimens only), is one component of a comprehensive MRSA colonization surveillance program. It is not intended to diagnose MRSA infection nor to guide or monitor treatment for MRSA infections.      Basic Metabolic Panel:  Recent Labs  81/19/14 08/28/15 1740  NA 142 141  K 4.1 2.7*  CL 98* 97*  CO2 33* 33*  GLUCOSE 104* 165*  BUN 15 15  CREATININE 0.73 0.52  CALCIUM 8.5* 8.3*  MG  --  1.6*   Liver Function Tests: No results for input(s): AST, ALT, ALKPHOS, BILITOT, PROT, ALBUMIN in the last 72 hours.   CBC:  Recent Labs  08/27/15 08/28/15 1740 08/29/15 0527  WBC 9.2 14.5* 6.9  NEUTROABS 7.8* 13.1*  --   HGB 10.7* 12.5 9.8*  HCT 33.7* 39.6 30.7*  MCV 86.2 85.3 84.8  PLT 166 189 151    Dg Chest Port 1 View  08/28/2015  CLINICAL DATA:  Initial evaluation for acute wheezing, leg swelling. EXAM: PORTABLE CHEST 1 VIEW COMPARISON:  Prior study from 08/25/2014. FINDINGS: Examination is somewhat suboptimal due to patient positioning and shallow lung  inflation. Cardiac and mediastinal silhouettes grossly stable, and remain within normal limits. Lungs are hypoinflated. Patchy bibasilar opacities, right greater than left, favored to reflect atelectasis. No pulmonary edema. No definite pleural effusion. No pneumothorax. No DOS abnormality. IMPRESSION: Shallow lung inflation with bibasilar linear opacities, right greater than left. Atelectasis is favored. No other definite active cardiopulmonary disease. Electronically Signed   By: Rise Mu M.D.   On: 08/28/2015 17:54      Medications:  Impression:  Active Problems:   Tobacco abuse   Diabetes mellitus without complication (HCC)   Hypertension   COPD exacerbation (HCC)   Hypokalemia   Chronic systolic CHF (congestive heart failure) (HCC)   New onset a-fib (HCC)   A-fib (HCC)     Plan: Serial electrolytes daily, cardiology consultation, 2-D echocardiogram to assess wall motion and contractility as well as chamber dimensions thyroid analysis and serial troponins  Consultants: Cardiology requested   Procedures   Antibiotics:                   Code Status: Full   Family Communication:    Disposition Plan see plan above  Time spent: 30 minutes   LOS: 1 day   Jeana Kersting M   08/29/2015, 7:29 AM

## 2015-08-29 NOTE — Care Management Note (Signed)
Case Management Note  Patient Details  Name: Tara Park MRN: [id number] Date of Birth: Aug 12, 1945  Subjective/Objective:                  Pt admitted for CHF and a-fib with RVR. Pt is from [facility]. CSW is aware and will arrange for return to facility when appropriate.   Action/Plan: No CM needs anticiapted.   Expected Discharge Date:    08/31/2015              Expected Discharge Plan:  Skilled Nursing Facility  In-House Referral:     Discharge planning Services  CM Consult  Post Acute Care Choice:  NA Choice offered to:  NA  DME Arranged:    DME Agency:     HH Arranged:    HH Agency:     Status of Service:  Completed, signed off  Medicare Important Message Given:    Date Medicare IM Given:    Medicare IM give by:    Date Additional Medicare IM Given:    Additional Medicare Important Message give by:     If discussed at Long Length of Stay Meetings, dates discussed:    Additional Comments:  Malcolm Metro, RN 08/29/2015, 3:42 PM

## 2015-08-29 NOTE — Progress Notes (Signed)
*  PRELIMINARY RESULTS* Echocardiogram 2D Echocardiogram has been performed.  Tara Park 08/29/2015, 3:08 PM

## 2015-08-29 NOTE — Progress Notes (Signed)
ANTICOAGULATION CONSULT NOTE  Pharmacy Consult for heparin and coumadin Indication: atrial fibrillation  No Known Allergies  Patient Measurements: Height:  (167.6 cm) Weight: 151 lb 10.8 oz (68.8 kg) IBW/kg (Calculated) : 59.3 Heparin Dosing Weight: 69 kg  Vital Signs: Temp: 97.6 F (36.4 C) (02/06 0500) Temp Source: Oral (02/06 0500) BP: 114/63 mmHg (02/06 0600) Pulse Rate: 66 (02/06 0630)  Labs:  Recent Labs  08/27/15 08/28/15 1740 08/29/15 0527  HGB 10.7* 12.5 9.8*  HCT 33.7* 39.6 30.7*  PLT 166 189 151  APTT  --  28  --   LABPROT  --  12.7  --   INR  --  0.93  --   HEPARINUNFRC  --   --  0.42  CREATININE 0.73 0.52  --   TROPONINI  --  0.03  --     Estimated Creatinine Clearance: 60.4 mL/min (by C-G formula based on Cr of 0.52).   Medical History: Past Medical History  Diagnosis Date  . Diabetes mellitus without complication (HCC)   . Hypertension   . Tobacco abuse   . Use of cane as ambulatory aid   . Leg edema, right     chronic  . Stroke (HCC)   . CHF (congestive heart failure) (HCC)   . COPD (chronic obstructive pulmonary disease) (HCC)     on O2  . Anxiety     Medications:  See medication history  Assessment: 70 yo lady with new onset afib to start heparin and coumadin.  Heparin level this am is therapeutic.  Hg dropped to 9.8, no overt bleeding noted.  INR 1.17 Goal of Therapy:  INR 2-3 Heparin level 0.3-0.7 units/ml Monitor platelets by anticoagulation protocol: Yes   Plan:  Cont heparin drip at 1000 units/hr Check heparin level later today to confirm Coumadin 5 mg po tonight Check heparin level, PT/INR and CBC daily Monitor for bleeding complications  Thanks for allowing pharmacy to be a part of this patient's care.  Talbert Cage, PharmD Clinical Pharmacist  08/29/2015,7:18 AM

## 2015-08-29 NOTE — Clinical Social Work Note (Signed)
Clinical Social Work Assessment  Patient Details  Name: Tara Park MRN: [id number] Date of Birth: [birth year]  Date of referral:  08/29/15               Reason for consult:                   Permission sought to share information with:    Permission granted to share information::     Name::        Agency::     Relationship::     Contact Information:     Housing/Transportation Living arrangements for the past 2 months:  Single Family Home Source of Information:  Adult Children, Facility Patient Interpreter Needed:  None Criminal Activity/Legal Involvement Pertinent to Current Situation/Hospitalization:  No - Comment as needed Significant Relationships:  Adult Children, Other Family Members Lives with:  Facility Resident Do you feel safe going back to the place where you live?  Yes Need for family participation in patient care:  Yes (Comment)  Care giving concerns:  Facility resident    Office manager / plan:  CSW spoke with Keri at [facility]. She advised that patient has been a resident at the facility since 06/10/15. She stated that patient uses a wheelchair and is not always oriented. Keri indicated that patient's family is supportive and that patient can return at discharge. CSW spoke with patient's daughter, Tara Park, who confirmed Keri's statements. She was agreeable to patient returning to [facility] at discharge.   Employment status:  Retired Health and safety inspector:  Medicare PT Recommendations:  Not assessed at this time Information / Referral to community resources:     Patient/Family's Response to care: Family is agreeable to patient returning to [facility].   Patient/Family's Understanding of and Emotional Response to Diagnosis, Current Treatment, and Prognosis:  Patient's family is aware to patient's diagnosis, treatment and prognosis.   Emotional Assessment Appearance:  Appears stated age Attitude/Demeanor/Rapport:  Unable to Assess Affect (typically observed):  Unable to  Assess Orientation:  Fluctuating Orientation (Suspected and/or reported Sundowners) Alcohol / Substance use:  Not Applicable Psych involvement (Current and /or in the community):  No (Comment)  Discharge Needs  Concerns to be addressed:  Discharge Planning Concerns Readmission within the last 30 days:  No Current discharge risk:  None Barriers to Discharge:  No Barriers Identified   Annice Needy, LCSW 08/29/2015, 4:39 PM

## 2015-08-30 ENCOUNTER — Inpatient Hospital Stay (HOSPITAL_COMMUNITY): Payer: Medicare Other

## 2015-08-30 ENCOUNTER — Encounter: Payer: Self-pay | Admitting: Neurology

## 2015-08-30 DIAGNOSIS — I5031 Acute diastolic (congestive) heart failure: Secondary | ICD-10-CM

## 2015-08-30 DIAGNOSIS — F05 Delirium due to known physiological condition: Secondary | ICD-10-CM

## 2015-08-30 DIAGNOSIS — N390 Urinary tract infection, site not specified: Secondary | ICD-10-CM

## 2015-08-30 LAB — HEPARIN LEVEL (UNFRACTIONATED)
HEPARIN UNFRACTIONATED: 0.13 [IU]/mL — AB (ref 0.30–0.70)
Heparin Unfractionated: 0.24 IU/mL — ABNORMAL LOW (ref 0.30–0.70)
Heparin Unfractionated: 0.27 IU/mL — ABNORMAL LOW (ref 0.30–0.70)

## 2015-08-30 LAB — BASIC METABOLIC PANEL
ANION GAP: 9 (ref 5–15)
BUN: 17 mg/dL (ref 6–20)
CO2: 28 mmol/L (ref 22–32)
Calcium: 8.2 mg/dL — ABNORMAL LOW (ref 8.9–10.3)
Chloride: 102 mmol/L (ref 101–111)
Creatinine, Ser: 0.43 mg/dL — ABNORMAL LOW (ref 0.44–1.00)
GFR calc Af Amer: >60 mL/min (ref >60)
GFR calc non Af Amer: >60 mL/min (ref >60)
GLUCOSE: 167 mg/dL — AB (ref 65–99)
POTASSIUM: 3.7 mmol/L (ref 3.5–5.1)
Sodium: 139 mmol/L (ref 135–145)

## 2015-08-30 LAB — CBC
HEMATOCRIT: 31.9 % — AB (ref 36.0–46.0)
Hemoglobin: 10.1 g/dL — ABNORMAL LOW (ref 12.0–15.0)
MCH: 26.6 pg (ref 26.0–34.0)
MCHC: 31.7 g/dL (ref 30.0–36.0)
MCV: 83.9 fL (ref 78.0–100.0)
Platelets: 192 10*3/uL (ref 150–400)
RBC: 3.8 MIL/uL — ABNORMAL LOW (ref 3.87–5.11)
RDW: 15.2 % (ref 11.5–15.5)
WBC: 14.8 10*3/uL — AB (ref 4.0–10.5)

## 2015-08-30 LAB — PROTIME-INR
INR: 1.8 — ABNORMAL HIGH (ref 0.00–1.49)
Prothrombin Time: 20.9 seconds — ABNORMAL HIGH (ref 11.6–15.2)

## 2015-08-30 LAB — HEMOGLOBIN A1C
Hgb A1c MFr Bld: 6.1 % — ABNORMAL HIGH (ref 4.8–5.6)
MEAN PLASMA GLUCOSE: 128 mg/dL

## 2015-08-30 LAB — GLUCOSE, CAPILLARY
GLUCOSE-CAPILLARY: 145 mg/dL — AB (ref 65–99)
Glucose-Capillary: 164 mg/dL — ABNORMAL HIGH (ref 65–99)
Glucose-Capillary: 190 mg/dL — ABNORMAL HIGH (ref 65–99)
Glucose-Capillary: 159 mg/dL — ABNORMAL HIGH (ref 65–99)

## 2015-08-30 MED ORDER — METOPROLOL TARTRATE 25 MG PO TABS
25.0000 mg | ORAL_TABLET | Freq: Two times a day (BID) | ORAL | Status: DC
  Administered 2015-08-30 – 2015-08-31 (×3): 25 mg via ORAL
  Filled 2015-08-30 (×3): qty 1

## 2015-08-30 MED ORDER — ALPRAZOLAM 0.25 MG PO TABS
0.2500 mg | ORAL_TABLET | Freq: Two times a day (BID) | ORAL | Status: DC | PRN
  Administered 2015-08-30 – 2015-09-04 (×5): 0.25 mg via ORAL
  Filled 2015-08-30 (×5): qty 1

## 2015-08-30 MED ORDER — METOPROLOL TARTRATE 25 MG PO TABS
12.5000 mg | ORAL_TABLET | Freq: Two times a day (BID) | ORAL | Status: DC
  Administered 2015-08-30: 12.5 mg via ORAL
  Filled 2015-08-30: qty 1

## 2015-08-30 MED ORDER — DILTIAZEM LOAD VIA INFUSION
10.0000 mg | Freq: Once | INTRAVENOUS | Status: AC
  Administered 2015-08-30: 10 mg via INTRAVENOUS
  Filled 2015-08-30: qty 10

## 2015-08-30 MED ORDER — DILTIAZEM HCL 100 MG IV SOLR
5.0000 mg/h | INTRAVENOUS | Status: DC
  Administered 2015-08-30 (×2): 15 mg/h via INTRAVENOUS
  Administered 2015-08-30 (×2): 10 mg/h via INTRAVENOUS
  Filled 2015-08-30 (×3): qty 100

## 2015-08-30 MED ORDER — WARFARIN SODIUM 2.5 MG PO TABS
2.5000 mg | ORAL_TABLET | Freq: Once | ORAL | Status: AC
  Administered 2015-08-30: 2.5 mg via ORAL
  Filled 2015-08-30: qty 1

## 2015-08-30 MED ORDER — FUROSEMIDE 10 MG/ML IJ SOLN
20.0000 mg | Freq: Once | INTRAMUSCULAR | Status: AC
  Administered 2015-08-30: 20 mg via INTRAVENOUS
  Filled 2015-08-30: qty 2

## 2015-08-30 MED ORDER — CIPROFLOXACIN HCL 250 MG PO TABS
500.0000 mg | ORAL_TABLET | Freq: Two times a day (BID) | ORAL | Status: DC
  Administered 2015-08-30 – 2015-09-05 (×13): 500 mg via ORAL
  Filled 2015-08-30 (×13): qty 2

## 2015-08-30 NOTE — Progress Notes (Signed)
Patient appears anxious and confused oriented in 3 spheres his UTI placed on Cipro sinus rhythm rate approximately 100 early on Cardizem 15/h Will increase Lopressor from 12-1/2-25 by mouth twice a day slow heart rate awaiting thyroid function analysis from this morning 2-D echo read results noted rate 1 diastolic dysfunction and normal systolic function [place] BJY:[id number] DOB: [birth year] DOA: 08/28/2015 PCP: Isabella Stalling, MD             Physical Exam: Blood pressure 118/56, pulse 94, temperature 98.5 F (36.9 C), temperature source Oral, resp. rate 21, height  (1.676 m), weight 149 lb 0.5 oz (67.6 kg), SpO2 98 %. lungs diminished breath sounds in the bases scattered rhonchi no rales no wheezes appreciable heart regular rhythm no S3 positive S4 no heaves thrills rubs. Abdomen soft nontender bowel sounds normoactive no guarding or rebound  Investigations:  Recent Results (from the past 240 hour(s))  MRSA PCR Screening     Status: None   Collection Time: 08/28/15 10:40 PM  Result Value Ref Range Status   MRSA by PCR NEGATIVE NEGATIVE Final    Comment:        The GeneXpert MRSA Assay (FDA approved for NASAL specimens only), is one component of a comprehensive MRSA colonization surveillance program. It is not intended to diagnose MRSA infection nor to guide or monitor treatment for MRSA infections.      Basic Metabolic Panel:  Recent Labs  08/65/78 1740 08/29/15 0731 08/30/15 0412  NA 141  --  139  K 2.7*  --  3.7  CL 97*  --  102  CO2 33*  --  28  GLUCOSE 165*  --  167*  BUN 15  --  17  CREATININE 0.52  --  0.43*  CALCIUM 8.3*  --  8.2*  MG 1.6* 1.7  --    Liver Function Tests: No results for input(s): AST, ALT, ALKPHOS, BILITOT, PROT, ALBUMIN in the last 72 hours.   CBC:  Recent Labs  08/28/15 1740 08/29/15 0527 08/30/15 0412  WBC 14.5* 6.9 14.8*  NEUTROABS 13.1*  --   --   HGB 12.5 9.8* 10.1*  HCT 39.6 30.7* 31.9*  MCV 85.3 84.8  83.9  PLT 189 151 192    Dg Chest Port 1 View  08/30/2015  CLINICAL DATA:  Acute onset of shortness of breath. Initial encounter. EXAM: PORTABLE CHEST 1 VIEW COMPARISON:  Chest radiograph 08/28/2015 FINDINGS: The lungs are well-aerated. Vascular congestion is noted, with minimal bilateral atelectasis. No pleural effusion or pneumothorax is seen. There The cardiomediastinal silhouette is within normal limits. No acute osseous abnormalities are seen. Density overlying the right lung apex appears to be artifactual in nature, as it is not seen on the recent prior study. IMPRESSION: Vascular congestion, with minimal bilateral atelectasis. Electronically Signed   By: [name] M.D.   On: 08/30/2015 05:33   Dg Chest Port 1 View  08/28/2015  CLINICAL DATA:  Initial evaluation for acute wheezing, leg swelling. EXAM: PORTABLE CHEST 1 VIEW COMPARISON:  Prior study from 08/25/2014. FINDINGS: Examination is somewhat suboptimal due to patient positioning and shallow lung inflation. Cardiac and mediastinal silhouettes grossly stable, and remain within normal limits. Lungs are hypoinflated. Patchy bibasilar opacities, right greater than left, favored to reflect atelectasis. No pulmonary edema. No definite pleural effusion. No pneumothorax. No DOS abnormality. IMPRESSION: Shallow lung inflation with bibasilar linear opacities, right greater than left. Atelectasis is favored. No other definite active cardiopulmonary disease. Electronically Signed  By: Rise Mu M.D.   On: 08/28/2015 17:54      Medications:   Impression:  Active Problems:   Tobacco abuse   Diabetes mellitus without complication (HCC)   Hypertension   COPD exacerbation (HCC)   Hypokalemia   Chronic systolic CHF (congestive heart failure) (HCC)   New onset a-fib (HCC)   A-fib (HCC)     Plan: Increase Lopressor 25 by mouth twice a day wean Cardizem when heart rate controlled waiting thyroid function analysis continue  anticoagulation as per pharmacy monitor PT/INR as Cipro is now on board for UTI   Consultants: Cardiology   Procedures   Antibiotics: Cipro 500 by mouth twice a day                  Code Status: Full   Family Communication:    Disposition Plan see plan above  Time spent: Minutes   LOS: 2 days   Britnie Colville M   08/30/2015, 12:00 PM

## 2015-08-30 NOTE — Progress Notes (Signed)
ANTICOAGULATION CONSULT NOTE  Pharmacy Consult for heparin Indication: atrial fibrillation  No Known Allergies  Patient Measurements: Height:  (167.6 cm) Weight: 149 lb 0.5 oz (67.6 kg) IBW/kg (Calculated) : 59.3 Heparin Dosing Weight: 69 kg  Vital Signs: BP: 150/62 mmHg (02/07 2112) Pulse Rate: 113 (02/07 2112)  Labs:  Recent Labs  08/28/15 1740  08/29/15 0527 08/29/15 0731 08/29/15 0732 08/29/15 1358 08/29/15 2018 08/30/15 0412 08/30/15 1542 08/30/15 2121  HGB 12.5  --  9.8*  --   --   --   --  10.1*  --   --   HCT 39.6  --  30.7*  --   --   --   --  31.9*  --   --   PLT 189  --  151  --   --   --   --  192  --   --   APTT 28  --   --   --   --   --   --   --   --   --   LABPROT 12.7  --   --   --  15.1  --   --  20.9*  --   --   INR 0.93  --   --   --  1.17  --   --  1.80*  --   --   HEPARINUNFRC  --   < > 0.42  --   --  0.25*  --  0.13* 0.24* 0.27*  CREATININE 0.52  --   --   --   --   --   --  0.43*  --   --   TROPONINI 0.03  --   --  <0.03  --  <0.03 <0.03  --   --   --   < > = values in this interval not displayed.  Estimated Creatinine Clearance: 60.4 mL/min (by C-G formula based on Cr of 0.43).   Medical History: Past Medical History  Diagnosis Date  . Diabetes mellitus without complication (HCC)   . Hypertension   . Tobacco abuse   . Use of cane as ambulatory aid   . Leg edema, right     chronic  . Stroke (HCC)   . CHF (congestive heart failure) (HCC)   . COPD (chronic obstructive pulmonary disease) (HCC)     on O2  . Anxiety     Medications:  See medication history  Assessment: 70 yo lady with new onset afib to start heparin and coumadin. No bleeding noted Heparin level slightly below goal  Goal of Therapy:   INR 2-3 Heparin level 0.3-0.7 units/ml Monitor platelets by anticoagulation protocol: Yes   Plan:  Increase heparin drip to 1400 units/hour Heparin level at 5 AM and daily Check heparin level, PT/INR and CBC  daily Monitor for bleeding complications  Thanks for allowing pharmacy to be a part of this patient's care.  Meylin Stenzel Marlaine Hind, [facility] Clinical Pharmacist  08/30/2015,10:31 PM

## 2015-08-30 NOTE — Progress Notes (Signed)
Called to room by patient. Found she had pulled her urinary catheter out of her bladder. Patient did have some light bleeding from her vagina but stated it was not painful. Replaced catheter and encouraged patient not to pull tube out again.

## 2015-08-30 NOTE — Progress Notes (Signed)
ANTICOAGULATION CONSULT NOTE  Pharmacy Consult for heparin and coumadin Indication: atrial fibrillation  No Known Allergies  Patient Measurements: Height:  (167.6 cm) Weight: 149 lb 0.5 oz (67.6 kg) IBW/kg (Calculated) : 59.3 Heparin Dosing Weight: 69 kg  Vital Signs: Temp: 98.5 F (36.9 C) (02/07 0730) Temp Source: Oral (02/07 0730) BP: 107/51 mmHg (02/07 1400) Pulse Rate: 98 (02/07 1400)  Labs:  Recent Labs  08/28/15 1740  08/29/15 0527 08/29/15 0731 08/29/15 0732 08/29/15 1358 08/29/15 2018 08/30/15 0412 08/30/15 1542  HGB 12.5  --  9.8*  --   --   --   --  10.1*  --   HCT 39.6  --  30.7*  --   --   --   --  31.9*  --   PLT 189  --  151  --   --   --   --  192  --   APTT 28  --   --   --   --   --   --   --   --   LABPROT 12.7  --   --   --  15.1  --   --  20.9*  --   INR 0.93  --   --   --  1.17  --   --  1.80*  --   HEPARINUNFRC  --   < > 0.42  --   --  0.25*  --  0.13* 0.24*  CREATININE 0.52  --   --   --   --   --   --  0.43*  --   TROPONINI 0.03  --   --  <0.03  --  <0.03 <0.03  --   --   < > = values in this interval not displayed.  Estimated Creatinine Clearance: 60.4 mL/min (by C-G formula based on Cr of 0.43).   Medical History: Past Medical History  Diagnosis Date  . Diabetes mellitus without complication (HCC)   . Hypertension   . Tobacco abuse   . Use of cane as ambulatory aid   . Leg edema, right     chronic  . Stroke (HCC)   . CHF (congestive heart failure) (HCC)   . COPD (chronic obstructive pulmonary disease) (HCC)     on O2  . Anxiety     Medications:  See medication history  Assessment: 70 yo lady with new onset afib to start heparin and coumadin. No bleeding noted Heparin level still below goal after rate increase this morning due to level below goal INR increased to 1.8 from INR 1.17 Goal of Therapy:   INR 2-3 Heparin level 0.3-0.7 units/ml Monitor platelets by anticoagulation protocol: Yes   Plan:  Increase  heparin drip to 1300 units/hour Check heparin level later today to confirm Coumadin 2.5 mg po tonight Check heparin level, PT/INR and CBC daily Monitor for bleeding complications  Thanks for allowing pharmacy to be a part of this patient's care.  Nivia Gervase Marlaine Hind, [facility] Clinical Pharmacist  08/30/2015,5:03 PM

## 2015-08-30 NOTE — Progress Notes (Signed)
Primary Cardiologist: Prentice Docker MD  Cardiology Specific Problem List: 1. Atrial fib with RVR 2. Hypertension 3. Hx of CVA  Subjective:    Significant amount of confusion and calling out to nurses and others. She is tachycardic and tachyonic, Anxious    Objective:   Temp:  [97.3 F (36.3 C)-98.6 F (37 C)] 98.5 F (36.9 C) (02/07 0730) Pulse Rate:  [68-138] 138 (02/07 0700) Resp:  [14-27] 21 (02/07 0700) BP: (104-165)/(50-123) 147/78 mmHg (02/07 0700) SpO2:  [93 %-100 %] 100 % (02/07 0723) Weight:  [149 lb 0.5 oz (67.6 kg)] 149 lb 0.5 oz (67.6 kg) (02/07 0500) Last BM Date: 08/29/15  Filed Weights   08/28/15 2241 08/29/15 0500 08/30/15 0500  Weight: 149 lb 4 oz (67.7 kg) 151 lb 10.8 oz (68.8 kg) 149 lb 0.5 oz (67.6 kg)    Intake/Output Summary (Last 24 hours) at 08/30/15 0840 Last data filed at 08/30/15 0600  Gross per 24 hour  Intake 479.09 ml  Output      0 ml  Net 479.09 ml   Echocardiogram 08/29/2015 Procedure narrative: Transthoracic echocardiography. Image quality was suboptimal, with poor valvular and endocardial visualization. - Left ventricle: The cavity size was normal. Wall thickness was increased in a pattern of mild LVH. Systolic function was vigorous. The estimated ejection fraction was in the range of 70% to 75%. Images were inadequate for LV wall motion assessment. Could not optimally visualize apex, thus unable to comment on trabeculations noted on 06/02/15 study. Doppler parameters are consistent with abnormal left ventricular relaxation (grade 1 diastolic dysfunction). Indeterminate filling pressures. - Mitral valve: Calcified annulus.  Telemetry: Atrial fib with RVR, rates up to 120 bpm.   Exam:  General: Anxious, confused.   HEENT: Conjunctiva and lids normal, oropharynx clear.  Lungs: Diminished in the bases, with upper airway crackles.  Cardiac: No elevated JVP or bruits. IRRR, tachycardic,  no gallop or  rub.   Abdomen: Normoactive bowel sounds, nontender, nondistended.  Extremities: 2+ pitting edema, R.L. distal pulses diminished. .  Neuropsychiatric: Alert very anxious and confused.   Lab Results:  Basic Metabolic Panel:  Recent Labs Lab 08/27/15 08/28/15 1740 08/29/15 0731 08/30/15 0412  NA 142 141  --  139  K 4.1 2.7*  --  3.7  CL 98* 97*  --  102  CO2 33* 33*  --  28  GLUCOSE 104* 165*  --  167*  BUN 15 15  --  17  CREATININE 0.73 0.52  --  0.43*  CALCIUM 8.5* 8.3*  --  8.2*  MG  --  1.6* 1.7  --     CBC:  Recent Labs Lab 08/28/15 1740 08/29/15 0527 08/30/15 0412  WBC 14.5* 6.9 14.8*  HGB 12.5 9.8* 10.1*  HCT 39.6 30.7* 31.9*  MCV 85.3 84.8 83.9  PLT 189 151 192    Cardiac Enzymes:  Recent Labs Lab 08/29/15 0731 08/29/15 1358 08/29/15 2018  TROPONINI <0.03 <0.03 <0.03    Coagulation:  Recent Labs Lab 08/28/15 1740 08/29/15 0732 08/30/15 0412  INR 0.93 1.17 1.80*    Radiology: Dg Chest Port 1 View  08/30/2015  CLINICAL DATA:  Acute onset of shortness of breath. Initial encounter. EXAM: PORTABLE CHEST 1 VIEW COMPARISON:  Chest radiograph 08/28/2015 FINDINGS: The lungs are well-aerated. Vascular congestion is noted, with minimal bilateral atelectasis. No pleural effusion or pneumothorax is seen. There The cardiomediastinal silhouette is within normal limits. No acute osseous abnormalities are seen. Density overlying the right lung  apex appears to be artifactual in nature, as it is not seen on the recent prior study. IMPRESSION: Vascular congestion, with minimal bilateral atelectasis. Electronically Signed   By: [name] M.D.   On: 08/30/2015 05:33   Dg Chest Port 1 View  08/28/2015  CLINICAL DATA:  Initial evaluation for acute wheezing, leg swelling. EXAM: PORTABLE CHEST 1 VIEW COMPARISON:  Prior study from 08/25/2014. FINDINGS: Examination is somewhat suboptimal due to patient positioning and shallow lung inflation. Cardiac and mediastinal  silhouettes grossly stable, and remain within normal limits. Lungs are hypoinflated. Patchy bibasilar opacities, right greater than left, favored to reflect atelectasis. No pulmonary edema. No definite pleural effusion. No pneumothorax. No DOS abnormality. IMPRESSION: Shallow lung inflation with bibasilar linear opacities, right greater than left. Atelectasis is favored. No other definite active cardiopulmonary disease. Electronically Signed   By: Rise Mu M.D.   On: 08/28/2015 17:54    Medications:   Scheduled Medications: . albuterol  2.5 mg Nebulization TID  . digoxin  0.125 mg Oral Daily  . furosemide  20 mg Oral Daily  . insulin aspart  0-15 Units Subcutaneous TID WC  . insulin aspart  0-5 Units Subcutaneous QHS  . potassium chloride SA  40 mEq Oral Daily  . pravastatin  80 mg Oral QPM  . sodium chloride flush  3 mL Intravenous Q12H  . Warfarin - Pharmacist Dosing Inpatient   Does not apply q1800    Infusions: . diltiazem (CARDIZEM) infusion 10 mg/hr (08/30/15 0800)  . heparin 1,200 Units/hr (08/30/15 0800)    PRN Medications: HYDROcodone-acetaminophen, RESOURCE THICKENUP CLEAR, traZODone   Assessment and Plan:   1.Atrial fib with RVR: No well controlled currently but she is anxious and confused. Had episodes of elevated HR overnight. Will begin low dose BB, metoprolol 12.5 mg BID.Continue coumadin. CHADS VASC Score of 7 INR 1.80.   2. Anxiety and Confusion: Will add low dose of Xanax 0.25 mg BID prn anxiety. Try not to over sedate. Will need to have possible psych consult to assist with dementia combined with hx of CVA. Consider having sitter or family member with her.   3. COPD: She continues on albuterol treatments. Breathing status is improved but with anxiety she has tachypnea.   4. Diastolic CHF: Some lower extremity edema is noted, she is on po lasix. Will given one dose of IV this am.   5. UTI: Noted on urinalysis yesterday. Will begin po antibiotics as  this has not been addressed yet.    Bettey Mare. Lawrence NP [facility]  08/30/2015, 8:40 AM   The patient was seen and examined, and I agree with the assessment and plan as documented above, with modifications as noted below. K. Lawrence NP astutely noted that the patient has an untreated UTI. Will commence therapy and culture urine, as this appears to be the underlying etiology for rapid atrial fibrillation and confusion. Echo showed vigorous LV systolic function, EF 70-75%. Agree with one dose of IV Lasix given lower extremity swelling and vascular congestion seen on today's chest xray. However, would be cautious not to over diurese in setting of UTI, which would make confusion worse. Continue warfarin and heparin for anticoagulation given subtherapeutic INR. Agree with addition of metoprolol in addition to digoxin for rate control. Diltiazem drip started earlier by Dr. Ouida Sills. Hopefully, the latter can be stopped with addition of oral metoprolol which can be increased as needed.  Prentice Docker, MD, [facility]  08/30/2015 10:04 AM

## 2015-08-30 NOTE — Progress Notes (Signed)
Dr Ouida Sills called about elevated heart rate, increased resp rate with audible wheezing and increased work of breathing; orders received on the chart

## 2015-08-31 ENCOUNTER — Encounter (HOSPITAL_COMMUNITY)
Admission: AD | Admit: 2015-08-31 | Discharge: 2015-08-31 | Disposition: A | Discharge status: Home / Self Care | Payer: Medicare Other | Source: Skilled Nursing Facility | Attending: Internal Medicine | Admitting: Internal Medicine

## 2015-08-31 DIAGNOSIS — M6281 Muscle weakness (generalized): Secondary | ICD-10-CM | POA: Insufficient documentation

## 2015-08-31 DIAGNOSIS — E785 Hyperlipidemia, unspecified: Secondary | ICD-10-CM | POA: Insufficient documentation

## 2015-08-31 DIAGNOSIS — I639 Cerebral infarction, unspecified: Secondary | ICD-10-CM

## 2015-08-31 DIAGNOSIS — R278 Other lack of coordination: Secondary | ICD-10-CM | POA: Insufficient documentation

## 2015-08-31 DIAGNOSIS — R488 Other symbolic dysfunctions: Secondary | ICD-10-CM | POA: Insufficient documentation

## 2015-08-31 DIAGNOSIS — R41841 Cognitive communication deficit: Secondary | ICD-10-CM | POA: Insufficient documentation

## 2015-08-31 DIAGNOSIS — I5022 Chronic systolic (congestive) heart failure: Secondary | ICD-10-CM | POA: Insufficient documentation

## 2015-08-31 DIAGNOSIS — J9611 Chronic respiratory failure with hypoxia: Secondary | ICD-10-CM | POA: Insufficient documentation

## 2015-08-31 DIAGNOSIS — E119 Type 2 diabetes mellitus without complications: Secondary | ICD-10-CM | POA: Insufficient documentation

## 2015-08-31 DIAGNOSIS — I10 Essential (primary) hypertension: Secondary | ICD-10-CM | POA: Insufficient documentation

## 2015-08-31 LAB — BASIC METABOLIC PANEL
Anion gap: 6 (ref 5–15)
BUN: 10 mg/dL (ref 6–20)
CALCIUM: 7.7 mg/dL — AB (ref 8.9–10.3)
CO2: 29 mmol/L (ref 22–32)
Chloride: 104 mmol/L (ref 101–111)
Creatinine, Ser: <0.3 mg/dL — ABNORMAL LOW (ref 0.44–1.00)
GLUCOSE: 135 mg/dL — AB (ref 65–99)
Potassium: 2.9 mmol/L — ABNORMAL LOW (ref 3.5–5.1)
Sodium: 139 mmol/L (ref 135–145)

## 2015-08-31 LAB — CBC
HCT: 29.1 % — ABNORMAL LOW (ref 36.0–46.0)
HCT: 30.8 % — ABNORMAL LOW (ref 36.0–46.0)
HEMOGLOBIN: 9.8 g/dL — AB (ref 12.0–15.0)
Hemoglobin: 9.4 g/dL — ABNORMAL LOW (ref 12.0–15.0)
MCH: 27 pg (ref 26.0–34.0)
MCH: 26.7 pg (ref 26.0–34.0)
MCHC: 32.3 g/dL (ref 30.0–36.0)
MCHC: 31.8 g/dL (ref 30.0–36.0)
MCV: 83.6 fL (ref 78.0–100.0)
MCV: 83.9 fL (ref 78.0–100.0)
PLATELETS: 158 10*3/uL (ref 150–400)
PLATELETS: 160 10*3/uL (ref 150–400)
RBC: 3.48 MIL/uL — ABNORMAL LOW (ref 3.87–5.11)
RBC: 3.67 MIL/uL — AB (ref 3.87–5.11)
RDW: 15.4 % (ref 11.5–15.5)
RDW: 15.3 % (ref 11.5–15.5)
WBC: 8.6 10*3/uL (ref 4.0–10.5)
WBC: 9.2 10*3/uL (ref 4.0–10.5)

## 2015-08-31 LAB — GLUCOSE, CAPILLARY
GLUCOSE-CAPILLARY: 142 mg/dL — AB (ref 65–99)
GLUCOSE-CAPILLARY: 125 mg/dL — AB (ref 65–99)
GLUCOSE-CAPILLARY: 115 mg/dL — AB (ref 65–99)
GLUCOSE-CAPILLARY: 266 mg/dL — AB (ref 65–99)

## 2015-08-31 LAB — URINE CULTURE: Culture: >=100000

## 2015-08-31 LAB — SEDIMENTATION RATE: SED RATE: 34 mm/h — AB (ref 0–22)

## 2015-08-31 LAB — VITAMIN B12: VITAMIN B 12: 568 pg/mL (ref 180–914)

## 2015-08-31 LAB — FOLATE: FOLATE: 10.6 ng/mL (ref >5.9)

## 2015-08-31 LAB — PROTIME-INR
INR: 2.11 — ABNORMAL HIGH (ref 0.00–1.49)
Prothrombin Time: 23.5 seconds — ABNORMAL HIGH (ref 11.6–15.2)

## 2015-08-31 LAB — HEPARIN LEVEL (UNFRACTIONATED): Heparin Unfractionated: 0.36 IU/mL (ref 0.30–0.70)

## 2015-08-31 LAB — TSH: TSH: 1.83 u[IU]/mL (ref 0.350–4.500)

## 2015-08-31 MED ORDER — DILTIAZEM HCL 60 MG PO TABS: 60.0000 mg | ORAL_TABLET | Freq: Three times a day (TID) | ORAL | Status: DC

## 2015-08-31 MED ORDER — IPRATROPIUM-ALBUTEROL 0.5-2.5 (3) MG/3ML IN SOLN
3.0000 mL | Freq: Three times a day (TID) | RESPIRATORY_TRACT | Status: DC
  Administered 2015-09-01 – 2015-09-03 (×6): 3 mL via RESPIRATORY_TRACT
  Filled 2015-08-31 (×7): qty 3

## 2015-08-31 MED ORDER — DILTIAZEM HCL 30 MG PO TABS
30.0000 mg | ORAL_TABLET | Freq: Three times a day (TID) | ORAL | Status: DC
  Administered 2015-08-31 – 2015-09-05 (×16): 30 mg via ORAL
  Filled 2015-08-31 (×17): qty 1

## 2015-08-31 MED ORDER — WARFARIN SODIUM 2.5 MG PO TABS
2.5000 mg | ORAL_TABLET | Freq: Once | ORAL | Status: AC
  Administered 2015-08-31: 2.5 mg via ORAL
  Filled 2015-08-31: qty 1

## 2015-08-31 MED ORDER — POTASSIUM CHLORIDE CRYS ER 20 MEQ PO TBCR
80.0000 meq | EXTENDED_RELEASE_TABLET | Freq: Once | ORAL | Status: AC
  Administered 2015-08-31: 80 meq via ORAL
  Filled 2015-08-31: qty 4

## 2015-08-31 MED ORDER — METHYLPREDNISOLONE SODIUM SUCC 125 MG IJ SOLR
125.0000 mg | Freq: Three times a day (TID) | INTRAMUSCULAR | Status: DC
  Administered 2015-08-31 – 2015-09-02 (×6): 125 mg via INTRAVENOUS
  Filled 2015-08-31 (×6): qty 2

## 2015-08-31 MED ORDER — IPRATROPIUM-ALBUTEROL 0.5-2.5 (3) MG/3ML IN SOLN
3.0000 mL | Freq: Four times a day (QID) | RESPIRATORY_TRACT | Status: DC
  Administered 2015-08-31 (×2): 3 mL via RESPIRATORY_TRACT
  Filled 2015-08-31 (×2): qty 3

## 2015-08-31 MED ORDER — WARFARIN - PHARMACIST DOSING INPATIENT
Status: DC
  Administered 2015-09-01: 16:00:00
  Administered 2015-09-02: 1
  Administered 2015-09-03: 16:00:00
  Administered 2015-09-04: 1
  Administered 2015-09-05: 16:00:00

## 2015-08-31 NOTE — Clinical Documentation Improvement (Signed)
Cardiology Internal Medicine  Please clarify if the following diagnosis, Acute on Chronic systolic  was:   Present at the time of admission (POA)  NOT present at the time of admission and it developed during the inpatient stay  Unable to clinically determine whether the condition was present on admission.  Unknown   Supporting Information: Doc in H +P/ BNP    Please exercise your independent, professional judgment when responding. A specific answer is not anticipated or expected.   Thank You,  Lavonda Jumbo [employer] Information Management [place] [phone]

## 2015-08-31 NOTE — Progress Notes (Signed)
Subjective:  Still quite confused  Objective:  Vital Signs in the last 24 hours: Temp:  [97.8 F (36.6 C)-98.2 F (36.8 C)] 98.1 F (36.7 C) (02/08 0833) Pulse Rate:  [60-113] 88 (02/08 0900) Resp:  [15-35] 19 (02/08 0900) BP: (67-179)/(43-164) 111/90 mmHg (02/08 0900) SpO2:  [97 %-100 %] 97 % (02/08 0900) Weight:  [146 lb 9.7 oz (66.5 kg)] 146 lb 9.7 oz (66.5 kg) (02/08 0500)  Intake/Output from previous day: 02/07 0701 - 02/08 0700 In: 779.3 [P.O.:240; I.V.:539.3] Out: 1600 [Urine:1600] Intake/Output from this shift: Total I/O In: -  Out: 300 [Urine:300]  Physical Exam: NECK: Without JVD, HJR, or bruit LUNGS: Decreased breath sounds with scattered wheezing, rhonchi and rales at bases HEART: Regular rate and rhythm, no murmur, gallop, rub, bruit, thrill, or heave EXTREMITIES: ankle edema bilaterally,Without cyanosis, clubbing   Lab Results:  Recent Labs  08/30/15 0412 08/31/15 0453  WBC 14.8* 8.6  HGB 10.1* 9.4*  PLT 192 158    Recent Labs  08/30/15 0412 08/31/15 0453  NA 139 139  K 3.7 2.9*  CL 102 104  CO2 28 29  GLUCOSE 167* 135*  BUN 17 10  CREATININE 0.43* <0.30*    Recent Labs  08/29/15 1358 08/29/15 2018  TROPONINI <0.03 <0.03   Hepatic Function Panel No results for input(s): PROT, ALBUMIN, AST, ALT, ALKPHOS, BILITOT, BILIDIR, IBILI in the last 72 hours. No results for input(s): CHOL in the last 72 hours. No results for input(s): PROTIME in the last 72 hours.    Cardiac Studies:  Assessment/Plan:   1.Atrial fib with RVR: better controlled currently on metoprolol 25 mg BID and IV diltiazem.Reluctant to increase beta blocker further with wheezing. May have to use oral diltiazem. Will try 60 mg tid.Continue coumadin. CHADS VASC Score of 7 INR 1.80.   2. Anxiety and Confusion: previous CVA, UTI   3. COPD: Still wheezing.She continues on albuterol treatments.   4. Diastolic CHF: Some lower extremity edema is noted, she is on po  lasix.-received 1 dose of IV lasix yesterday.   5. UTI: Continue cipro.      LOS: 3 days    Jacolyn Reedy 08/31/2015, 10:06 AM   The patient was seen and examined, and I agree with the assessment and plan as documented above, with modifications as noted below. Pt maintaining sinus rhythm with HR 90-110 bpm. Hypotensive during my exam. Will adjust diltiazem dose to 30 mg TID. Agree with not increasing metoprolol dose due to wheezing. Continue digoxin. INR therapeutic and is on warfarin. Heparin d/c by pharmacy. Markedly hypokalemic, will give 80 meq KCl once in addition to maintenance therapy.  Could consider resuming ASA 81 mg given prior h/o stroke but would favor monitoring Hgb given warfarin initiation. Would not resume Plavix if doing so.  Prentice Docker, MD, [facility]  08/31/2015 10:55 AM

## 2015-08-31 NOTE — Progress Notes (Signed)
Patient ID: Tara Park, female   DOB: [birth year], 70 y.o.   MRN: [id number]                PROGRESS NOTE  DATE:  08/27/2015          FACILITY: [facility]              LEVEL OF CARE:   SNF   Acute Visit             CHIEF COMPLAINT:  Weight gain.    HISTORY OF PRESENT ILLNESS:  Tara Park is a lady with oxygen-dependent COPD.  She came here after a series of admissions to [facility] in November 2016 with COPD acute.    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    She has a history of diastolic heart failure and a recent echocardiogram on 06/12/2015 showed an EF of 70-75%, grade 1 diastolic dysfunction.  No significant valve disease.  This was a marked improvement from an echo on 06/02/2015 that showed an EF of 25-30%.  I think she was felt to have some form of stress cardiomyopathy.    In any case, I have been asked to see her because of edema and weight gain.  It would appear that her baseline weights seemed to be around the 140-pound range in January.  This has progressively increased.  As of today, she is at 152.2 pounds.    She had a recent right leg duplex ultrasound that was negative.    A portable chest x-ray on 08/26/2015 showed mild right basilar subsegmental atelectasis.    PAST MEDICAL HISTORY/PROBLEM LIST:  Past Medical History  Diagnosis Date  . Diabetes mellitus without complication (HCC)   . Hypertension   . Tobacco abuse   . Use of cane as ambulatory aid   . Leg edema, right     chronic  . Stroke (HCC)   . CHF (congestive heart failure) (HCC)   . COPD (chronic obstructive pulmonary disease) (HCC)     on O2  . Anxiety      PAST SURGICAL HISTORY:  Past Surgical History  Procedure Laterality Date  . Cataract extraction    . Hip surgery    . Foot surgery        CURRENT MEDICATIONS:  Medication list is reviewed.           Aspirin 81 q.d.      Plavix 75 q.d.     Lasix 20 q.d.    Hydrocodone  5/325 q.6 hours p.r.n.     Metformin 500 b.i.d.      KCl 10 mEq a day.    Albuterol nebulizers 2.5 three times a day and p.r.n.     Pravastatin 40 q.d.      Trazodone 50 q.d.      LABORATORY DATA:   Lab work from today:    Sodium 142, potassium 4.1, BUN 15, creatinine 0.73.    White count 9.2, hemoglobin 10.7, 84% neutrophils.    Recent TSH was 2.364.    REVIEW OF SYSTEMS:    GENERAL:   HEENT:    CHEST/RESPIRATORY:  The patient is complaining about being out of oxygen even though she has oxygen on at 2 L.  She is complaining of cough and shortness of breath.      CARDIAC:  No clear chest pain or palpitations.     GI:  No abdominal pain.   No nausea or  vomiting.   GU:  No dysuria.    CIRCULATION:  Extremities:  Staff have noted increasing edema.   PSYCHIATRIC:  Mental status:  The patient states that she is upset about having to prove "everything to everybody".     PHYSICAL EXAMINATION:   VITAL SIGNS:     PULSE:  110.   RESPIRATIONS:  24.   02 SATURATIONS:  97% on 2 L.   GENERAL APPEARANCE:   The patient looks despondent.  Pressure of speech.   CHEST/RESPIRATORY:  Upper airway sounds.  Mildly prolonged expiratory phase, expiratory wheezing.   CARDIOVASCULAR:   CARDIAC:  Heart sounds are normal.  JVP is elevated.       GASTROINTESTINAL:   ABDOMEN:  Soft.     LIVER/SPLEEN/KIDNEYS:  No liver, no spleen.  No tenderness.     GENITOURINARY:   BLADDER:  No suprapubic or costovertebral angle tenderness.    CIRCULATION:   EDEMA/VARICOSITIES:  Extremities:  Very significant pitting edema, left leg actually greater than right, extending above the knees.  She has scant sacral edema.     NEUROLOGICAL:   I see nothing new here.   SENSATION/STRENGTH:  Chronic left-sided paresis in the arm greater than leg.   PSYCHIATRIC:   MENTAL STATUS:   Again, pressure of speech, almost bordering on psychosis at some points.    ASSESSMENT/PLAN:                  Weight gain.  No doubt,  secondary to fluid overload.  She is only on a small dose of Lasix daily.  She will clearly need more than this.  She has diastolic heart failure by history.  I am assuming that is what this is.  She has a history of a stress cardiomyopathy that improved quite a bit from an echo at the beginning of November to later on.  She is somewhat tachycardic and I wonder whether we are having an issue here.    COPD.  She is on routine nebulizers.  I wonder if she could manage a combination LABA/steroid inhaler.    Mental status.  This almost looks like a psychiatric issue.  I had thought this might be secondary to high-dose prednisone that she was on, although this has stopped.  She is somewhat improved but still has pressure of speech, thought form and content abnormalities.  I wonder if she is going to require follow-up neuroimaging at some point, although I certainly cannot find anything to justify this based on a pure physical/neurological exam.     CPT CODE: 16109

## 2015-08-31 NOTE — Progress Notes (Signed)
Patient appears not confused at present performed dementia panel workup and has some mild end expiratory wheezing will add Solu-Medrol 125 IV every 6 hours and change her nebulizer from albuterol to DuoNeb every 6 hours patient stopped smoking 2 months ago [name] NUU:[id number] DOB: August 20, 1945 DOA: 08/28/2015 PCP: Isabella Stalling, MD             Physical Exam: Blood pressure 154/72, pulse 98, temperature 98.1 F (36.7 C), temperature source Oral, resp. rate 30, height  (1.676 m), weight 146 lb 9.7 oz (66.5 kg), SpO2 100 %. lungs show mild end expiratory wheezes prolonged expiratory phase minimal breath sounds at bases no rales audible heart regular rhythm no S3-S4 no heaves thrills rubs abdomen soft nontender bowel sounds normoactive   Investigations:  Recent Results (from the past 240 hour(s))  Urine culture     Status: None   Collection Time: 08/28/15  7:41 PM  Result Value Ref Range Status   Specimen Description URINE, CATHETERIZED  Final   Special Requests NONE  Final   Culture   Final    >=100,000 COLONIES/mL ESCHERICHIA COLI Performed at [facility]    Report Status 08/31/2015 FINAL  Final   Organism ID, Bacteria ESCHERICHIA COLI  Final      Susceptibility   Escherichia coli - MIC*    AMPICILLIN >=32 RESISTANT Resistant     CEFAZOLIN <=4 SENSITIVE Sensitive     CEFTRIAXONE <=1 SENSITIVE Sensitive     CIPROFLOXACIN <=0.25 SENSITIVE Sensitive     GENTAMICIN <=1 SENSITIVE Sensitive     IMIPENEM <=0.25 SENSITIVE Sensitive     NITROFURANTOIN <=16 SENSITIVE Sensitive     TRIMETH/SULFA 160 RESISTANT Resistant     AMPICILLIN/SULBACTAM >=32 RESISTANT Resistant     PIP/TAZO 64 INTERMEDIATE Intermediate     * >=100,000 COLONIES/mL ESCHERICHIA COLI  MRSA PCR Screening     Status: None   Collection Time: 08/28/15 10:40 PM  Result Value Ref Range Status   MRSA by PCR NEGATIVE NEGATIVE Final    Comment:        The GeneXpert MRSA Assay (FDA approved for  NASAL specimens only), is one component of a comprehensive MRSA colonization surveillance program. It is not intended to diagnose MRSA infection nor to guide or monitor treatment for MRSA infections.      Basic Metabolic Panel:  Recent Labs  34/74/25 1740 08/29/15 0731 08/30/15 0412 08/31/15 0453  NA 141  --  139 139  K 2.7*  --  3.7 2.9*  CL 97*  --  102 104  CO2 33*  --  28 29  GLUCOSE 165*  --  167* 135*  BUN 15  --  17 10  CREATININE 0.52  --  0.43* <0.30*  CALCIUM 8.3*  --  8.2* 7.7*  MG 1.6* 1.7  --   --    Liver Function Tests: No results for input(s): AST, ALT, ALKPHOS, BILITOT, PROT, ALBUMIN in the last 72 hours.   CBC:  Recent Labs  08/28/15 1740  08/30/15 0412 08/31/15 0453  WBC 14.5*  < > 14.8* 8.6  NEUTROABS 13.1*  --   --   --   HGB 12.5  < > 10.1* 9.4*  HCT 39.6  < > 31.9* 29.1*  MCV 85.3  < > 83.9 83.6  PLT 189  < > 192 158  < > = values in this interval not displayed.  Dg Chest Port 1 View  08/30/2015  CLINICAL DATA:  Acute onset  of shortness of breath. Initial encounter. EXAM: PORTABLE CHEST 1 VIEW COMPARISON:  Chest radiograph 08/28/2015 FINDINGS: The lungs are well-aerated. Vascular congestion is noted, with minimal bilateral atelectasis. No pleural effusion or pneumothorax is seen. There The cardiomediastinal silhouette is within normal limits. No acute osseous abnormalities are seen. Density overlying the right lung apex appears to be artifactual in nature, as it is not seen on the recent prior study. IMPRESSION: Vascular congestion, with minimal bilateral atelectasis. Electronically Signed   By: [name] M.D.   On: 08/30/2015 05:33      Medications:   Impression:  Active Problems:   Tobacco abuse   Diabetes mellitus without complication (HCC)   Hypertension   COPD exacerbation (HCC)   Hypokalemia   Chronic systolic CHF (congestive heart failure) (HCC)   New onset a-fib (HCC)   A-fib (HCC)     Plan: Change albuterol to  DuoNeb nebulizers every 6 hours and Solu-Medrol 125 IV every 8 hours for wheezing continue Cardizem 60 every 8 hours for blood pressure and rate control continue Lopressor 25 by mouth twice a day potassium chloride 80 mg given today monitor B med daily 3 days  Consultants: Cardiology   Procedures  Antibiotics:                   Code Status: Full  Family Communication:    Disposition Plan see plan above  Time spent: 30 minutes   LOS: 3 days   Tara Park M   08/31/2015, 12:31 PM

## 2015-08-31 NOTE — Progress Notes (Signed)
ANTICOAGULATION CONSULT NOTE   Pharmacy Consult for Coumadin Indication: atrial fibrillation  No Known Allergies  Patient Measurements: Height:  (167.6 cm) Weight: 146 lb 9.7 oz (66.5 kg) IBW/kg (Calculated) : 59.3 HEPARIN DW (KG): 67.7  Vital Signs: Temp: 98.2 F (36.8 C) (02/08 0400) Temp Source: Oral (02/08 0400) BP: 133/53 mmHg (02/08 0600) Pulse Rate: 67 (02/08 0600)  Labs:  Recent Labs  08/28/15 1740  08/29/15 0527 08/29/15 0731 08/29/15 0732 08/29/15 1358 08/29/15 2018 08/30/15 0412 08/30/15 1542 08/30/15 2121 08/31/15 0453  HGB 12.5  --  9.8*  --   --   --   --  10.1*  --   --  9.4*  HCT 39.6  --  30.7*  --   --   --   --  31.9*  --   --  29.1*  PLT 189  --  151  --   --   --   --  192  --   --  158  APTT 28  --   --   --   --   --   --   --   --   --   --   LABPROT 12.7  --   --   --  15.1  --   --  20.9*  --   --  23.5*  INR 0.93  --   --   --  1.17  --   --  1.80*  --   --  2.11*  HEPARINUNFRC  --   < > 0.42  --   --  0.25*  --  0.13* 0.24* 0.27* 0.36  CREATININE 0.52  --   --   --   --   --   --  0.43*  --   --  <0.30*  TROPONINI 0.03  --   --  <0.03  --  <0.03 <0.03  --   --   --   --   < > = values in this interval not displayed.  CrCl cannot be calculated (Patient has no serum creatinine result on file.).  Medical History: Past Medical History  Diagnosis Date  . Diabetes mellitus without complication (HCC)   . Hypertension   . Tobacco abuse   . Use of cane as ambulatory aid   . Leg edema, right     chronic  . Stroke (HCC)   . CHF (congestive heart failure) (HCC)   . COPD (chronic obstructive pulmonary disease) (HCC)     on O2  . Anxiety    Medications:  See medication history  Assessment: 70 yo lady with new onset afib to start heparin and coumadin. No bleeding noted Heparin level is therapeutic, INR is now at goal / therapeutic.  Goal of Therapy:  INR 2-3 Monitor platelets by anticoagulation protocol: Yes   Plan:  D/C  Heparin as INR is now at goal Coumadin 2.5mg  po today x 1 PT/INR and CBC daily Monitor for bleeding complications  Thanks for allowing pharmacy to be a part of this patient's care.  Valrie Hart, PharmD Clinical Pharmacist 08/31/2015,7:45 AM

## 2015-08-31 NOTE — Progress Notes (Signed)
Called report to Darvin Neighbours, RN on dept 300. Verbalized understanding. Pt transferred to room 336 in stable and safe condition.

## 2015-09-01 DIAGNOSIS — R410 Disorientation, unspecified: Secondary | ICD-10-CM

## 2015-09-01 LAB — BASIC METABOLIC PANEL
Anion gap: 5 (ref 5–15)
BUN: 15 mg/dL (ref 6–20)
CALCIUM: 8.2 mg/dL — AB (ref 8.9–10.3)
CO2: 29 mmol/L (ref 22–32)
CREATININE: 0.36 mg/dL — AB (ref 0.44–1.00)
Chloride: 107 mmol/L (ref 101–111)
Glucose, Bld: 203 mg/dL — ABNORMAL HIGH (ref 65–99)
Potassium: 3.9 mmol/L (ref 3.5–5.1)
SODIUM: 141 mmol/L (ref 135–145)

## 2015-09-01 LAB — RPR: RPR: NONREACTIVE

## 2015-09-01 LAB — IRON AND TIBC
Iron: 46 ug/dL (ref 28–170)
Saturation Ratios: 16 % (ref 10.4–31.8)
TIBC: 280 ug/dL (ref 250–450)
UIBC: 234 ug/dL

## 2015-09-01 LAB — CBC
HCT: 28.9 % — ABNORMAL LOW (ref 36.0–46.0)
Hemoglobin: 9.4 g/dL — ABNORMAL LOW (ref 12.0–15.0)
MCH: 27.2 pg (ref 26.0–34.0)
MCHC: 32.5 g/dL (ref 30.0–36.0)
MCV: 83.5 fL (ref 78.0–100.0)
Platelets: 163 10*3/uL (ref 150–400)
RBC: 3.46 MIL/uL — ABNORMAL LOW (ref 3.87–5.11)
RDW: 15.2 % (ref 11.5–15.5)
WBC: 5.2 10*3/uL (ref 4.0–10.5)

## 2015-09-01 LAB — GLUCOSE, CAPILLARY
GLUCOSE-CAPILLARY: 248 mg/dL — AB (ref 65–99)
Glucose-Capillary: 188 mg/dL — ABNORMAL HIGH (ref 65–99)
Glucose-Capillary: 249 mg/dL — ABNORMAL HIGH (ref 65–99)
Glucose-Capillary: 238 mg/dL — ABNORMAL HIGH (ref 65–99)

## 2015-09-01 LAB — PROTIME-INR
INR: 2.19 — AB (ref 0.00–1.49)
PROTHROMBIN TIME: 24.2 s — AB (ref 11.6–15.2)

## 2015-09-01 LAB — FOLATE: Folate: 14 ng/mL (ref >5.9)

## 2015-09-01 LAB — RETICULOCYTES
RBC.: 3.86 MIL/uL — AB (ref 3.87–5.11)
RETIC COUNT ABSOLUTE: 42.5 10*3/uL (ref 19.0–186.0)
Retic Ct Pct: 1.1 % (ref 0.4–3.1)

## 2015-09-01 LAB — VITAMIN B12: Vitamin B-12: 652 pg/mL (ref 180–914)

## 2015-09-01 LAB — FERRITIN: FERRITIN: 455 ng/mL — AB (ref 11–307)

## 2015-09-01 MED ORDER — WARFARIN SODIUM 5 MG PO TABS
2.5000 mg | ORAL_TABLET | Freq: Once | ORAL | Status: AC
  Administered 2015-09-01: 2.5 mg via ORAL
  Filled 2015-09-01: qty 1

## 2015-09-01 MED ORDER — METOPROLOL TARTRATE 25 MG PO TABS: 12.5000 mg | ORAL_TABLET | Freq: Two times a day (BID) | ORAL | Status: DC

## 2015-09-01 NOTE — Progress Notes (Signed)
Patient has increased wheeze today as normal systolic function and diastolic dysfunction currently on nebulizers and steroids Solu-Medrol 125 IV every 8 patient appears anxious due to respiratory status currently in sinus rhythm KATIE FARAONE ZOX:[id number] DOB: 1945-10-07 DOA: 08/28/2015 PCP: Isabella Stalling, MD             Physical Exam: Blood pressure 138/64, pulse 86, temperature 98.2 F (36.8 C), temperature source Oral, resp. rate 20, height  (1.676 m), weight 146 lb 9.7 oz (66.5 kg), SpO2 96 %. lungs show end expiratory wheeze scattered rhonchi diminished breath sounds in the bases no rales audible neck no JVD no carotid bruits no thyromegaly heart regular rhythm no S3-S4 measles rubs   Investigations:  Recent Results (from the past 240 hour(s))  Urine culture     Status: None   Collection Time: 08/28/15  7:41 PM  Result Value Ref Range Status   Specimen Description URINE, CATHETERIZED  Final   Special Requests NONE  Final   Culture   Final    >=100,000 COLONIES/mL ESCHERICHIA COLI Performed at [facility]    Report Status 08/31/2015 FINAL  Final   Organism ID, Bacteria ESCHERICHIA COLI  Final      Susceptibility   Escherichia coli - MIC*    AMPICILLIN >=32 RESISTANT Resistant     CEFAZOLIN <=4 SENSITIVE Sensitive     CEFTRIAXONE <=1 SENSITIVE Sensitive     CIPROFLOXACIN <=0.25 SENSITIVE Sensitive     GENTAMICIN <=1 SENSITIVE Sensitive     IMIPENEM <=0.25 SENSITIVE Sensitive     NITROFURANTOIN <=16 SENSITIVE Sensitive     TRIMETH/SULFA 160 RESISTANT Resistant     AMPICILLIN/SULBACTAM >=32 RESISTANT Resistant     PIP/TAZO 64 INTERMEDIATE Intermediate     * >=100,000 COLONIES/mL ESCHERICHIA COLI  MRSA PCR Screening     Status: None   Collection Time: 08/28/15 10:40 PM  Result Value Ref Range Status   MRSA by PCR NEGATIVE NEGATIVE Final    Comment:        The GeneXpert MRSA Assay (FDA approved for NASAL specimens only), is one component of  a comprehensive MRSA colonization surveillance program. It is not intended to diagnose MRSA infection nor to guide or monitor treatment for MRSA infections.      Basic Metabolic Panel:  Recent Labs  81/19/14 0453 09/01/15 0645  NA 139 141  K 2.9* 3.9  CL 104 107  CO2 29 29  GLUCOSE 135* 203*  BUN 10 15  CREATININE <0.30* 0.36*  CALCIUM 7.7* 8.2*   Liver Function Tests: No results for input(s): AST, ALT, ALKPHOS, BILITOT, PROT, ALBUMIN in the last 72 hours.   CBC:  Recent Labs  08/31/15 1532 09/01/15 0645  WBC 9.2 5.2  HGB 9.8* 9.4*  HCT 30.8* 28.9*  MCV 83.9 83.5  PLT 160 163    No results found.    Medications:  Impression:  Active Problems:   Tobacco abuse   Diabetes mellitus without complication (HCC)   Hypertension   COPD exacerbation (HCC)   Hypokalemia   Chronic systolic CHF (congestive heart failure) (HCC)   New onset a-fib (HCC)   A-fib (HCC)     Plan: Pack, Lopressor from 25-12 a half by mouth twice a day due to wheezing which worsened with increasing dosage increase Cardizem from every 8 to every 6 for control of rate and/or ventricular response   Consultants: Cardiology   Procedures   Antibiotics:  Code Status:  Family Communication:    Disposition Plan see plan above  Time spent: 30 minutes   LOS: 4 days   Taleeyah Bora M   09/01/2015, 9:57 AM

## 2015-09-01 NOTE — Progress Notes (Signed)
ANTICOAGULATION CONSULT NOTE   Pharmacy Consult for Coumadin Indication: atrial fibrillation  No Known Allergies  Patient Measurements: Height:  (167.6 cm) Weight: 146 lb 9.7 oz (66.5 kg) IBW/kg (Calculated) : 59.3 HEPARIN DW (KG): 67.7  Vital Signs: Temp: 98.2 F (36.8 C) (02/09 0515) Temp Source: Oral (02/09 0515) BP: 138/64 mmHg (02/09 0515) Pulse Rate: 84 (02/09 0515)  Labs:  Recent Labs  08/29/15 1358 08/29/15 2018  08/30/15 0412 08/30/15 1542 08/30/15 2121 08/31/15 0453 08/31/15 1532 09/01/15 0645  HGB  --   --   < > 10.1*  --   --  9.4* 9.8* 9.4*  HCT  --   --   < > 31.9*  --   --  29.1* 30.8* 28.9*  PLT  --   --   < > 192  --   --  158 160 163  LABPROT  --   --   --  20.9*  --   --  23.5*  --  24.2*  INR  --   --   --  1.80*  --   --  2.11*  --  2.19*  HEPARINUNFRC 0.25*  --   --  0.13* 0.24* 0.27* 0.36  --   --   CREATININE  --   --   --  0.43*  --   --  <0.30*  --  0.36*  TROPONINI <0.03 <0.03  --   --   --   --   --   --   --   < > = values in this interval not displayed.  Estimated Creatinine Clearance: 60.4 mL/min (by C-G formula based on Cr of 0.36).  Medical History: Past Medical History  Diagnosis Date  . Diabetes mellitus without complication (HCC)   . Hypertension   . Tobacco abuse   . Use of cane as ambulatory aid   . Leg edema, right     chronic  . Stroke (HCC)   . CHF (congestive heart failure) (HCC)   . COPD (chronic obstructive pulmonary disease) (HCC)     on O2  . Anxiety    Medications:  See medication history  Assessment: 70 yo lady with new onset afib to start heparin and coumadin. No bleeding noted. INR is now at goal / therapeutic, therefore heparin d/c'd  Goal of Therapy:  INR 2-3 Monitor platelets by anticoagulation protocol: Yes   Plan:  Coumadin 2.5mg  po today x 1 PT/INR and CBC daily Monitor for bleeding complications  Thanks for allowing pharmacy to be a part of this patient's care.  Valrie Hart,  PharmD Clinical Pharmacist 09/01/2015,7:51 AM

## 2015-09-01 NOTE — Progress Notes (Addendum)
Subjective:  SOB at rest. She knew her her primary MD was and gave "Jan 10th 2017" for the date.   Objective:  Vital Signs in the last 24 hours: Temp:  [97.4 F (36.3 C)-98.3 F (36.8 C)] 98.2 F (36.8 C) (02/09 0515) Pulse Rate:  [82-98] 86 (02/09 0819) Resp:  [19-30] 20 (02/09 0515) BP: (102-154)/(45-90) 138/64 mmHg (02/09 0515) SpO2:  [96 %-100 %] 96 % (02/09 0819)  I/O -856 last 24 hrs  I/O - 372 since adm (? accuracy)    Physical Exam: NECK: Without JVD, HJR, or bruit LUNGS: Decreased breath sounds with scattered expiratory wheezing, rhonchi and rales at bases HEART: Regular rhythm, increased rate, no murmur, gallop, rub, bruit, thrill, or heave EXTREMITIES: trace ankle edema bilaterally,Without cyanosis, clubbing   Lab Results:  Recent Labs  08/31/15 1532 09/01/15 0645  WBC 9.2 5.2  HGB 9.8* 9.4*  PLT 160 163    Recent Labs  08/31/15 0453 09/01/15 0645  NA 139 141  K 2.9* 3.9  CL 104 107  CO2 29 29  GLUCOSE 135* 203*  BUN 10 15  CREATININE <0.30* 0.36*    Recent Labs  08/29/15 1358 08/29/15 2018  TROPONINI <0.03 <0.03   Hepatic Function Panel No results for input(s): PROT, ALBUMIN, AST, ALT, ALKPHOS, BILITOT, BILIDIR, IBILI in the last 72 hours. No results for input(s): CHOL in the last 72 hours. No results for input(s): PROTIME in the last 72 hours.    Cardiac Studies: Echo 08/29/15 Study Conclusions  - Procedure narrative: Transthoracic echocardiography. Image quality was suboptimal, with poor valvular and endocardial visualization. - Left ventricle: The cavity size was normal. Wall thickness was increased in a pattern of mild LVH. Systolic function was vigorous. The estimated ejection fraction was in the range of 70% to 75%. Images were inadequate for LV wall motion assessment. Could not optimally visualize apex, thus unable to comment on trabeculations noted on 06/02/15 study. Doppler parameters are consistent with  abnormal left ventricular relaxation (grade 1 diastolic dysfunction). Indeterminate filling pressures. - Mitral valve: Calcified annulus.  Assessment/Plan:   1.Atrial fib with RVR:  currently NSR/ ST on metoprolol 25 mg BID, Lanoxin 0.125 mg,  and diltiazem  TID. Reluctant to increase beta blocker further with wheezing. CHADS VASC Score of 7 INR therapeutic, off Heparin.   2. Anxiety and Confusion: previous CVA, UTI   3. COPD: Still wheezing.She continues on albuterol treatments and steroids.   4. Diastolic CHF: CXR 08/29/25 suggest CHF, she received Lasix IV x 1 on 2/7. She may need more aggressive diuresis. K+ has been replaced, B/P stable last 24 hrs.   5. UTI: Continue Cipro per primary team. .      LOS: 4 days    KILROY,LUKE K PA 09/01/2015, 8:36 AM  The patient was seen and examined, and I agree with the assessment and plan as documented above, with modifications as noted below. Maintaining sinus rhythm/sinus tachycardia on aforementioned meds with diltiazem added yesterday. Underlying etiology is multifactorial with UTI, anemia, and COPD contributing.  INR therapeutic on warfarin. Being treated for UTI with ciprofloxacin. Remains anxious and confused. Diltiazem can be increased as needed so long as BP tolerates. Has vigorous LV systolic function, EF 70-75%. Currently on Lasix 20 mg daily.  No further recommendations at this time.  Prentice Docker, MD, [facility]  09/01/2015 9:45 AM    ADDENDUM: Spoke with Dr. Janna Arch who plans to d/c metoprolol due to bronchospastic disease. As stated previously, diltiazem can be increased as  needed for optimal HR control.  Will sign off. Please call with questions.

## 2015-09-02 ENCOUNTER — Telehealth: Payer: Self-pay

## 2015-09-02 LAB — CBC
HEMATOCRIT: 29.7 % — AB (ref 36.0–46.0)
HEMOGLOBIN: 9.7 g/dL — AB (ref 12.0–15.0)
MCH: 27 pg (ref 26.0–34.0)
MCHC: 32.7 g/dL (ref 30.0–36.0)
MCV: 82.7 fL (ref 78.0–100.0)
Platelets: 208 10*3/uL (ref 150–400)
RBC: 3.59 MIL/uL — ABNORMAL LOW (ref 3.87–5.11)
RDW: 15.4 % (ref 11.5–15.5)
WBC: 12.6 10*3/uL — AB (ref 4.0–10.5)

## 2015-09-02 LAB — PROTIME-INR
INR: 2.89 — AB (ref 0.00–1.49)
Prothrombin Time: 29.7 seconds — ABNORMAL HIGH (ref 11.6–15.2)

## 2015-09-02 LAB — GLUCOSE, CAPILLARY
GLUCOSE-CAPILLARY: 213 mg/dL — AB (ref 65–99)
Glucose-Capillary: 183 mg/dL — ABNORMAL HIGH (ref 65–99)
Glucose-Capillary: 288 mg/dL — ABNORMAL HIGH (ref 65–99)
Glucose-Capillary: 250 mg/dL — ABNORMAL HIGH (ref 65–99)

## 2015-09-02 MED ORDER — METHYLPREDNISOLONE SODIUM SUCC 125 MG IJ SOLR
40.0000 mg | Freq: Three times a day (TID) | INTRAMUSCULAR | Status: DC
  Administered 2015-09-02 – 2015-09-05 (×10): 40 mg via INTRAVENOUS
  Filled 2015-09-02 (×10): qty 2

## 2015-09-02 MED ORDER — WARFARIN SODIUM 5 MG PO TABS: 2.5000 mg | ORAL_TABLET | Freq: Once | ORAL | Status: DC

## 2015-09-02 MED ORDER — WARFARIN SODIUM 2.5 MG PO TABS
1.2500 mg | ORAL_TABLET | Freq: Once | ORAL | Status: AC
  Administered 2015-09-02: 1.25 mg via ORAL
  Filled 2015-09-02: qty 1

## 2015-09-02 NOTE — Progress Notes (Signed)
Bronchospasm much improved with cessation of beta blockade will decrease Solu-Medrol to 40 every 6 hours we'll ask physical therapy to see patient for strengthening and ambulation no significant dyspnea orthopnea this morning Tara Park:[id number] DOB: Jun 02, 1946 DOA: 08/28/2015 PCP: Isabella Stalling, MD             Physical Exam: Blood pressure 158/70, pulse 84, temperature 97.5 F (36.4 C), temperature source Oral, resp. rate 18, height  (1.676 m), weight 146 lb 9.7 oz (66.5 kg), SpO2 100 %. lungs no rales no wheezes appreciable diminished breath sounds in the bases heart regular rhythm no S3-S4 no heaves thrills rubs abdomen soft nontender bowel sounds normoactive   Investigations:  Recent Results (from the past 240 hour(s))  Urine culture     Status: None   Collection Time: 08/28/15  7:41 PM  Result Value Ref Range Status   Specimen Description URINE, CATHETERIZED  Final   Special Requests NONE  Final   Culture   Final    >=100,000 COLONIES/mL ESCHERICHIA COLI Performed at [facility]    Report Status 08/31/2015 FINAL  Final   Organism ID, Bacteria ESCHERICHIA COLI  Final      Susceptibility   Escherichia coli - MIC*    AMPICILLIN >=32 RESISTANT Resistant     CEFAZOLIN <=4 SENSITIVE Sensitive     CEFTRIAXONE <=1 SENSITIVE Sensitive     CIPROFLOXACIN <=0.25 SENSITIVE Sensitive     GENTAMICIN <=1 SENSITIVE Sensitive     IMIPENEM <=0.25 SENSITIVE Sensitive     NITROFURANTOIN <=16 SENSITIVE Sensitive     TRIMETH/SULFA 160 RESISTANT Resistant     AMPICILLIN/SULBACTAM >=32 RESISTANT Resistant     PIP/TAZO 64 INTERMEDIATE Intermediate     * >=100,000 COLONIES/mL ESCHERICHIA COLI  MRSA PCR Screening     Status: None   Collection Time: 08/28/15 10:40 PM  Result Value Ref Range Status   MRSA by PCR NEGATIVE NEGATIVE Final    Comment:        The GeneXpert MRSA Assay (FDA approved for NASAL specimens only), is one component of a comprehensive MRSA  colonization surveillance program. It is not intended to diagnose MRSA infection nor to guide or monitor treatment for MRSA infections.      Basic Metabolic Panel:  Recent Labs  81/19/14 0453 09/01/15 0645  NA 139 141  K 2.9* 3.9  CL 104 107  CO2 29 29  GLUCOSE 135* 203*  BUN 10 15  CREATININE <0.30* 0.36*  CALCIUM 7.7* 8.2*   Liver Function Tests: No results for input(s): AST, ALT, ALKPHOS, BILITOT, PROT, ALBUMIN in the last 72 hours.   CBC:  Recent Labs  09/01/15 0645 09/02/15 0644  WBC 5.2 12.6*  HGB 9.4* 9.7*  HCT 28.9* 29.7*  MCV 83.5 82.7  PLT 163 208    No results found.    Medications:  Impression:  Active Problems:   Tobacco abuse   Diabetes mellitus without complication (HCC)   Hypertension   COPD exacerbation (HCC)   Hypokalemia   Chronic systolic CHF (congestive heart failure) (HCC)   New onset a-fib (HCC)   A-fib (HCC)     Plan: Decrease Solu-Medrol to 40 mg IV every 6 hours physical therapy consult for strengthening ambulation  Consultants:    Procedures   Antibiotics:                   Code Status:  Family Communication:    Disposition Plan see plan above  Time spent:  30 minutes   LOS: 5 days   Charolett Yarrow M   09/02/2015, 7:44 AM

## 2015-09-02 NOTE — Telephone Encounter (Signed)
Rn call patients son Tara Park to ask him if his mother was still in the nursing home. Rn explain patient was a no show at her hospital appt this month. Tara Park stated his mom is in the hospital at [facility] because of swelling in both of her legs. Rn explain when patient is discharge from the hospital have the nursing home schedule her for a hospital follow up. Pts son will notified the nursing home of the need to schedule appt.

## 2015-09-02 NOTE — Care Management Important Message (Signed)
Important Message  Patient Details  Name: Tara Park MRN: [id number] Date of Birth: [birth year]   Medicare Important Message Given:  Yes    Adonis Huguenin, RN 09/02/2015, 9:38 AM

## 2015-09-02 NOTE — NC FL2 (Signed)
[place] MEDICAID FL2 LEVEL OF CARE SCREENING TOOL     IDENTIFICATION  Patient Name: Tara Park Birthdate: 12-04-1945 Sex: female Admission Date (Current Location): 08/28/2015  [facility] and [place] Number:  [employer] and Address:  [facility],  [address], [place] [address]      Provider Number: [id number]  Attending Physician Name and Address:  Oval Linsey, MD  Relative Name and Phone Number:       Current Level of Care: Hospital Recommended Level of Care: Skilled Nursing Facility Prior Approval Number:    Date Approved/Denied:   PASRR Number:  ( [id number] )  Discharge Plan: SNF    Current Diagnoses: Patient Active Problem List   Diagnosis Date Noted  . New onset a-fib (HCC) 08/28/2015  . A-fib (HCC) 08/28/2015  . Weight gain 08/27/2015  . Edema 08/27/2015  . Subacute confusional state 08/02/2015  . Palpitations 06/14/2015  . Oral thrush 06/14/2015  . UTI (urinary tract infection) 06/13/2015  . Drowsiness 06/12/2015  . Aspiration pneumonia (HCC) 06/11/2015  . Demand ischemia (HCC) 06/11/2015  . Hypokalemia 06/11/2015  . Chronic systolic CHF (congestive heart failure) (HCC) 06/11/2015  . 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 COPD (chronic obstructive pulmonary disease) (HCC) 06/11/2015  . Chronic hypoxemic respiratory failure (HCC) 06/11/2015  . Acute ischemic stroke (HCC) 05/31/2015  . COPD exacerbation (HCC) 05/31/2015  . Tobacco abuse   . Diabetes mellitus without complication (HCC)   . Hypertension   . BURSITIS, HIP 09/09/2007  . TRIGGER FINGER 09/09/2007  . FRACTURE, FEMUR, INTERTROCHANTERIC REGION 09/09/2007  . DIABETES 08/01/2007    Orientation RESPIRATION BLADDER Height & Weight     Self, Time, Situation, Place  O2 (3L) Incontinent Weight: 146 lb 9.7 oz (66.5 kg) Height:   (167.6 cm)  BEHAVIORAL SYMPTOMS/MOOD NEUROLOGICAL BOWEL NUTRITION STATUS      Incontinent Diet (Carb Modified.  Fluid Consistency Honey Thick)  AMBULATORY STATUS COMMUNICATION OF NEEDS Skin   Total Care Verbally Normal                       Personal Care Assistance Level of Assistance  Bathing, Feeding, Dressing Bathing Assistance: Maximum assistance Feeding assistance: Maximum assistance Dressing Assistance: Maximum assistance     Functional Limitations Info             SPECIAL CARE FACTORS FREQUENCY                       Contractures      Additional Factors Info  Isolation Precautions, Psychotropic, Insulin Sliding Scale     Psychotropic Info:  (Trazadone) Insulin Sliding Scale Info:  (Daily) Isolation Precautions Info:  (07/11/15 Wound cultures X 2 grew MRSA )     Current Medications (09/02/2015):  This is the current hospital active medication list Current Facility-Administered Medications  Medication Dose Route Frequency Provider Last Rate Last Dose  . ALPRAZolam Prudy Feeler) tablet 0.25 mg  0.25 mg Oral BID PRN Jodelle Gross, NP   0.25 mg at 09/01/15 2211  . ciprofloxacin (CIPRO) tablet 500 mg  500 mg Oral BID Jodelle Gross, NP   500 mg at 09/02/15 1036  . digoxin (LANOXIN) tablet 0.125 mg  0.125 mg Oral Daily Jodelle Gross, NP   0.125 mg at 09/02/15 1037  . diltiazem (CARDIZEM) tablet 30 mg  30 mg Oral 3 times per day Laqueta Linden, MD   30 mg at  09/02/15 0518  . furosemide (LASIX) tablet 20 mg  20 mg Oral Daily Laqueta Linden, MD   20 mg at 09/02/15 1037  . HYDROcodone-acetaminophen (NORCO/VICODIN) 5-325 MG per tablet 1 tablet  1 tablet Oral Q4H PRN Houston Siren, MD      . insulin aspart (novoLOG) injection 0-15 Units  0-15 Units Subcutaneous TID WC Houston Siren, MD   3 Units at 09/02/15 1042  . insulin aspart (novoLOG) injection 0-5 Units  0-5 Units Subcutaneous QHS Houston Siren, MD   2 Units at 09/01/15 2133  . ipratropium-albuterol (DUONEB) 0.5-2.5 (3) MG/3ML nebulizer solution 3 mL  3 mL Nebulization TID Oval Linsey, MD   3 mL at 09/02/15 0747   . methylPREDNISolone sodium succinate (SOLU-MEDROL) 125 mg/2 mL injection 40 mg  40 mg Intravenous 3 times per day Oval Linsey, MD      . potassium chloride SA (K-DUR,KLOR-CON) CR tablet 40 mEq  40 mEq Oral Daily Houston Siren, MD   40 mEq at 09/02/15 1036  . pravastatin (PRAVACHOL) tablet 80 mg  80 mg Oral QPM Houston Siren, MD   80 mg at 09/01/15 1755  . RESOURCE THICKENUP CLEAR   Oral PRN Oval Linsey, MD   1 Can at 08/29/15 [phone]  . sodium chloride flush (NS) 0.9 % injection 3 mL  3 mL Intravenous Q12H Houston Siren, MD   3 mL at 09/01/15 2135  . traZODone (DESYREL) tablet 50 mg  50 mg Oral QHS PRN Houston Siren, MD   50 mg at 09/01/15 2130  . warfarin (COUMADIN) tablet 1.25 mg  1.25 mg Oral Once Oval Linsey, MD      . Warfarin - Pharmacist Dosing Inpatient   Does not apply Q24H Oval Linsey, MD         Discharge Medications: Please see discharge summary for a list of discharge medications.  Relevant Imaging Results:  Relevant Lab Results:   Additional Information SSN [id number]  Srinika, Delone, LCSW

## 2015-09-02 NOTE — Evaluation (Signed)
Physical Therapy Evaluation Patient Details Name: Tara Park MRN: [id number] DOB: 07/12/1946 Today's Date: 09/02/2015   History of Present Illness  70yo white female who comes to [facility] on 2/5 after worsening swelling in BLE. [place] r/o DVT on 2/3. Pt admitted for chronic systolic CHF. Pt is familiar to this PT from prior admissions in November 2016, where she sustained TIA and CVA. Pt is now a long-term resident at [facility], and is total care at baseline.   Clinical Impression  Pt is received semirecumbent in bed upon entry, awake, alert, and willing to participate. No acute distress noted. Pt is A&Ox3 and pleasant. Baseline functional status is obtained from rehab manager at [facility], as pt just recently finished 90D of skilled PT services at [facility] (less than 30 days ago). Strength screening reveals Left sided hemiplegia on LUE and LLE, with mild contractures in the hand and wrist, and profound weakness wherein has little to no AROM. Functional mobility assessment shows a need for max assist for bed mobility for physical assistance which is baseline for the patient. No transfers or ambulation are attempted at this time, as pt requires totalA for these at baseline. Pt remaining on 3L O2 throughout evaluation, c O2Sat WNL, whereas pt is not on O2 at baseline. Patient presenting with impairment of strength, range of motion, balance, oxygen perfusion, and activity tolerance, limiting ability to perform ADL and mobility tasks at  baseline level of function. Pt is presenting at functional baseline, aside from new O2 need. I am recommending pt return to [facility] when medically ready. No additional PT services are needed at this time. PT signing off.     Follow Up Recommendations No PT follow up    Equipment Recommendations  None recommended by PT    Recommendations for Other Services       Precautions / Restrictions Precautions Precautions: Fall Restrictions Weight Bearing Restrictions: No      Mobility  Bed  Mobility Overal bed mobility: Needs Assistance Bed Mobility: Supine to Sit;Sit to Supine     Supine to sit: Max assist Sit to supine: Max assist   General bed mobility comments: is able to participate with bridging for bed scoot 10%.   Transfers                 General transfer comment: Pt is totalA for transfers at baseline; none attempted this session.   Ambulation/Gait                Stairs            Wheelchair Mobility    Modified Rankin (Stroke Patients Only)       Balance Overall balance assessment: Needs assistance Sitting-balance support: Single extremity supported;Feet supported Sitting balance-Leahy Scale: Poor                                       Pertinent Vitals/Pain Pain Assessment: No/denies pain    Home Living Family/patient expects to be discharged to:: Skilled nursing facility                      Prior Function Level of Independence: Needs assistance         Comments: Pt is total care at [facility]; max to totalA for transfers and bed mobility.      Hand Dominance   Dominant Hand: Right    Extremity/Trunk Assessment  Upper Extremity Assessment: Generalized weakness;LUE deficits/detail       LUE Deficits / Details: Left sided falccidity, with some contractures in the hand and wrist.    Lower Extremity Assessment: Generalized weakness;LLE deficits/detail   LLE Deficits / Details: Left sided falccidity  Cervical / Trunk Assessment:  (very weak, poor endurance. )  Communication   Communication: No difficulties  Cognition Arousal/Alertness: Awake/alert Behavior During Therapy: WFL for tasks assessed/performed Overall Cognitive Status: Within Functional Limits for tasks assessed                      General Comments      Exercises        Assessment/Plan    PT Assessment Patent does not need any further PT services  PT Diagnosis Hemiplegia non-dominant side;Generalized  weakness;Difficulty walking   PT Problem List    PT Treatment Interventions     PT Goals (Current goals can be found in the Care Plan section) Acute Rehab PT Goals PT Goal Formulation: All assessment and education complete, DC therapy    Frequency     Barriers to discharge        Co-evaluation               End of Session Equipment Utilized During Treatment: Oxygen Activity Tolerance: Patient tolerated treatment well;Patient limited by fatigue Patient left: in bed;with call bell/phone within reach Nurse Communication: Other (comment)         Time: 4098-1191 PT Time Calculation (min) (ACUTE ONLY): 20 min   Charges:   PT Evaluation $PT Eval Moderate Complexity: 1 Procedure PT Treatments $Therapeutic Activity: 8-22 mins   PT G Codes:        12:33 PM, 09-17-15 Rosamaria Lints, PT, DPT PRN Physical Therapist at [facility] [place] License # [id number] [phone] (wireless)  [phone] (mobile)

## 2015-09-02 NOTE — Progress Notes (Addendum)
ANTICOAGULATION CONSULT NOTE   Pharmacy Consult for Coumadin Indication: atrial fibrillation  No Known Allergies  Patient Measurements: Height:  (167.6 cm) Weight: 146 lb 9.7 oz (66.5 kg) IBW/kg (Calculated) : 59.3 HEPARIN DW (KG): 67.7  Vital Signs: Temp: 97.5 F (36.4 C) (02/10 0518) Temp Source: Oral (02/10 0518) BP: 158/70 mmHg (02/10 0518) Pulse Rate: 84 (02/10 0518)  Labs:  Recent Labs  08/30/15 1542 08/30/15 2121  08/31/15 0453 08/31/15 1532 09/01/15 0645 09/02/15 0644  HGB  --   --   < > 9.4* 9.8* 9.4* 9.7*  HCT  --   --   < > 29.1* 30.8* 28.9* 29.7*  PLT  --   --   < > 158 160 163 208  LABPROT  --   --   --  23.5*  --  24.2* 29.7*  INR  --   --   --  2.11*  --  2.19* 2.89*  HEPARINUNFRC 0.24* 0.27*  --  0.36  --   --   --   CREATININE  --   --   --  <0.30*  --  0.36*  --   < > = values in this interval not displayed.  Estimated Creatinine Clearance: 60.4 mL/min (by C-G formula based on Cr of 0.36).  Medical History: Past Medical History  Diagnosis Date  . Diabetes mellitus without complication (HCC)   . Hypertension   . Tobacco abuse   . Use of cane as ambulatory aid   . Leg edema, right     chronic  . Stroke (HCC)   . CHF (congestive heart failure) (HCC)   . COPD (chronic obstructive pulmonary disease) (HCC)     on O2  . Anxiety    Medications:  See medication history  Assessment: 70 yo lady with new onset afib bridged with heparin and now only on coumadin. No bleeding noted. INR is therapeutic, 2.89. INR 2.1-->2.89(significant jump).  Goal of Therapy:  INR 2-3 Monitor platelets by anticoagulation protocol: Yes   Plan:  Coumadin 1.25mg  po today x 1 PT/INR and CBC daily Monitor for bleeding complications  Thanks for allowing pharmacy to be a part of this patient's care.  Elder Cyphers, BS Loura Back, BCPS Clinical Pharmacist Pager [phone]  09/02/2015,8:48 AM

## 2015-09-02 NOTE — Telephone Encounter (Signed)
-----   Message from Marvel Plan, MD sent at 09/01/2015  6:00 PM EST ----- Could you please let the patient know that the 30 day heart monitoring test done recently was negative for afib. Please continue current treatment.   She no showed for her last visit. Could you please reschedule with her in about 1-2 months? Thanks.  Marvel Plan, MD PhD Stroke Neurology 09/01/2015 6:00 PM

## 2015-09-02 NOTE — Telephone Encounter (Signed)
Pt's daughter-in-law called said she had a missed call. Msg was relayed that pt will need to make a f/u when she is released form hospital

## 2015-09-03 LAB — GLUCOSE, CAPILLARY
Glucose-Capillary: 231 mg/dL — ABNORMAL HIGH (ref 65–99)
Glucose-Capillary: 279 mg/dL — ABNORMAL HIGH (ref 65–99)
Glucose-Capillary: 249 mg/dL — ABNORMAL HIGH (ref 65–99)

## 2015-09-03 LAB — PROTIME-INR
INR: 2.92 — ABNORMAL HIGH (ref 0.00–1.49)
Prothrombin Time: 30 seconds — ABNORMAL HIGH (ref 11.6–15.2)

## 2015-09-03 MED ORDER — WARFARIN SODIUM 2.5 MG PO TABS
1.2500 mg | ORAL_TABLET | Freq: Once | ORAL | Status: AC
  Administered 2015-09-03: 1.25 mg via ORAL
  Filled 2015-09-03: qty 1

## 2015-09-03 MED ORDER — IPRATROPIUM-ALBUTEROL 0.5-2.5 (3) MG/3ML IN SOLN
3.0000 mL | Freq: Two times a day (BID) | RESPIRATORY_TRACT | Status: DC
  Administered 2015-09-03 – 2015-09-05 (×4): 3 mL via RESPIRATORY_TRACT
  Filled 2015-09-03 (×3): qty 3

## 2015-09-03 NOTE — Progress Notes (Signed)
ANTICOAGULATION CONSULT NOTE   Pharmacy Consult for Coumadin Indication: atrial fibrillation  No Known Allergies  Patient Measurements: Height:  (167.6 cm) Weight: 146 lb 9.7 oz (66.5 kg) IBW/kg (Calculated) : 59.3 HEPARIN DW (KG): 67.7  Vital Signs: Temp: 97.8 F (36.6 C) (02/11 0637) Temp Source: Oral (02/11 0637) BP: 143/74 mmHg (02/11 0637) Pulse Rate: 91 (02/11 0637)  Labs:  Recent Labs  08/31/15 1532 09/01/15 0645 09/02/15 0644 09/03/15 0644  HGB 9.8* 9.4* 9.7*  --   HCT 30.8* 28.9* 29.7*  --   PLT 160 163 208  --   LABPROT  --  24.2* 29.7* 30.0*  INR  --  2.19* 2.89* 2.92*  CREATININE  --  0.36*  --   --     Estimated Creatinine Clearance: 60.4 mL/min (by C-G formula based on Cr of 0.36).  Medical History: Past Medical History  Diagnosis Date  . Diabetes mellitus without complication (HCC)   . Hypertension   . Tobacco abuse   . Use of cane as ambulatory aid   . Leg edema, right     chronic  . Stroke (HCC)   . CHF (congestive heart failure) (HCC)   . COPD (chronic obstructive pulmonary disease) (HCC)     on O2  . Anxiety    Medications:  See medication history  Assessment: 70 yo lady with new onset afib bridged with heparin and now only on coumadin. No bleeding noted. INR is therapeutic, 2.89. INR 2.1-->2.89(significant jump). Initially given 7.5mg  then , then decreased to 2.5mg . Gave 1.25mg  yesterday and will likely need 2.5mg  daily. Will give 1.25mg  today and continue to monitor daily. INR=2.92 today and is stable.  Goal of Therapy:  INR 2-3 Monitor platelets by anticoagulation protocol: Yes   Plan:  Coumadin 1.25mg  po today x 1 PT/INR and CBC daily Monitor for bleeding complications  Thanks for allowing pharmacy to be a part of this patient's care.  Elder Cyphers, BS Loura Back, BCPS Clinical Pharmacist Pager [phone]  09/03/2015,10:08 AM

## 2015-09-03 NOTE — Progress Notes (Signed)
Patient is less bronchospasm than yesterday 36 hours of beta-blockade continue with nebulizer therapy and moderate dose steroids KAMONI DEPREE [name] DOB: 12/16/1945 DOA: 08/28/2015 PCP: Isabella Stalling, MD             Physical Exam: Blood pressure 152/75, pulse 103, temperature 98.2 F (36.8 C), temperature source Oral, resp. rate 18, height  (1.676 m), weight 146 lb 9.7 oz (66.5 kg), SpO2 95 %. neck no JVD no carotid bruits no thyromegaly lungs clear lungs diminished breath sounds in the bases prolonged respiratory phase mild end expiratory wheeze no rales audible heart regular rhythm no S3-S4 no heaves thrills rubs abdomen soft nontender bowel sounds normoactive no guarding or rebound   Investigations:  Recent Results (from the past 240 hour(s))  Urine culture     Status: None   Collection Time: 08/28/15  7:41 PM  Result Value Ref Range Status   Specimen Description URINE, CATHETERIZED  Final   Special Requests NONE  Final   Culture   Final    >=100,000 COLONIES/mL ESCHERICHIA COLI Performed at [facility]    Report Status 08/31/2015 FINAL  Final   Organism ID, Bacteria ESCHERICHIA COLI  Final      Susceptibility   Escherichia coli - MIC*    AMPICILLIN >=32 RESISTANT Resistant     CEFAZOLIN <=4 SENSITIVE Sensitive     CEFTRIAXONE <=1 SENSITIVE Sensitive     CIPROFLOXACIN <=0.25 SENSITIVE Sensitive     GENTAMICIN <=1 SENSITIVE Sensitive     IMIPENEM <=0.25 SENSITIVE Sensitive     NITROFURANTOIN <=16 SENSITIVE Sensitive     TRIMETH/SULFA 160 RESISTANT Resistant     AMPICILLIN/SULBACTAM >=32 RESISTANT Resistant     PIP/TAZO 64 INTERMEDIATE Intermediate     * >=100,000 COLONIES/mL ESCHERICHIA COLI  MRSA PCR Screening     Status: None   Collection Time: 08/28/15 10:40 PM  Result Value Ref Range Status   MRSA by PCR NEGATIVE NEGATIVE Final    Comment:        The GeneXpert MRSA Assay (FDA approved for NASAL specimens only), is one component of  a comprehensive MRSA colonization surveillance program. It is not intended to diagnose MRSA infection nor to guide or monitor treatment for MRSA infections.      Basic Metabolic Panel:  Recent Labs  95/62/13 0645  NA 141  K 3.9  CL 107  CO2 29  GLUCOSE 203*  BUN 15  CREATININE 0.36*  CALCIUM 8.2*   Liver Function Tests: No results for input(s): AST, ALT, ALKPHOS, BILITOT, PROT, ALBUMIN in the last 72 hours.   CBC:  Recent Labs  09/01/15 0645 09/02/15 0644  WBC 5.2 12.6*  HGB 9.4* 9.7*  HCT 28.9* 29.7*  MCV 83.5 82.7  PLT 163 208    No results found.    Medications:   Impression:  Active Problems:   Tobacco abuse   Diabetes mellitus without complication (HCC)   Hypertension   COPD exacerbation (HCC)   Hypokalemia   Chronic systolic CHF (congestive heart failure) (HCC)   New onset a-fib (HCC)   A-fib (HCC)     Plan: 2-D moderate dose steroids nebulizer therapy physical therapy for strengthening and ambulation  Consultants:    Procedures   Antibiotics:                   Code Status: Full   Family Communication:  [place] with patient at length  Disposition Plan plan above  Time spent: 30 minutes  LOS: 6 days   Darald Uzzle M   09/03/2015, 4:18 PM

## 2015-09-04 LAB — GLUCOSE, CAPILLARY
GLUCOSE-CAPILLARY: 295 mg/dL — AB (ref 65–99)
GLUCOSE-CAPILLARY: 298 mg/dL — AB (ref 65–99)
GLUCOSE-CAPILLARY: 308 mg/dL — AB (ref 65–99)
Glucose-Capillary: 235 mg/dL — ABNORMAL HIGH (ref 65–99)
Glucose-Capillary: 176 mg/dL — ABNORMAL HIGH (ref 65–99)

## 2015-09-04 LAB — PROTIME-INR
INR: 2.91 — AB (ref 0.00–1.49)
PROTHROMBIN TIME: 29.9 s — AB (ref 11.6–15.2)

## 2015-09-04 MED ORDER — WARFARIN SODIUM 2.5 MG PO TABS
1.2500 mg | ORAL_TABLET | Freq: Once | ORAL | Status: AC
  Administered 2015-09-04: 1.25 mg via ORAL
  Filled 2015-09-04: qty 1

## 2015-09-04 NOTE — Progress Notes (Signed)
ANTICOAGULATION CONSULT NOTE   Pharmacy Consult for Coumadin Indication: atrial fibrillation  No Known Allergies  Patient Measurements: Height:  (167.6 cm) Weight: 146 lb 9.7 oz (66.5 kg) IBW/kg (Calculated) : 59.3 HEPARIN DW (KG): 67.7  Vital Signs: Temp: 97.9 F (36.6 C) (02/12 0536) Temp Source: Oral (02/12 0536) BP: 145/73 mmHg (02/12 0536) Pulse Rate: 98 (02/12 0536)  Labs:  Recent Labs  09/02/15 0644 09/03/15 0644 09/04/15 0605  HGB 9.7*  --   --   HCT 29.7*  --   --   PLT 208  --   --   LABPROT 29.7* 30.0* 29.9*  INR 2.89* 2.92* 2.91*    Estimated Creatinine Clearance: 60.4 mL/min (by C-G formula based on Cr of 0.36).  Medical History: Past Medical History  Diagnosis Date  . Diabetes mellitus without complication (HCC)   . Hypertension   . Tobacco abuse   . Use of cane as ambulatory aid   . Leg edema, right     chronic  . Stroke (HCC)   . CHF (congestive heart failure) (HCC)   . COPD (chronic obstructive pulmonary disease) (HCC)     on O2  . Anxiety    Medications:  See medication history  Assessment: 70 yo lady with new onset afib bridged with heparin and now only on coumadin. No bleeding noted. INR is therapeutic, 2.89. INR 2.1-->2.89(significant jump). Initially given 7.5mg  then , then decreased to 2.5mg . Gave 1.25mg  yesterday and will likely need 2.5mg  daily. Will give 1.25mg  today and continue to monitor daily. INR=2.91 today and is stable.  Goal of Therapy:  INR 2-3 Monitor platelets by anticoagulation protocol: Yes   Plan:  Coumadin 1.25mg  po today x 1 PT/INR and CBC daily Monitor for bleeding complications  Thanks for allowing pharmacy to be a part of this patient's care.  Elder Cyphers, BS Loura Back, BCPS Clinical Pharmacist Pager [phone] 09/04/2015,11:21 AM

## 2015-09-05 LAB — GLUCOSE, CAPILLARY
GLUCOSE-CAPILLARY: 199 mg/dL — AB (ref 65–99)
GLUCOSE-CAPILLARY: 212 mg/dL — AB (ref 65–99)
Glucose-Capillary: 258 mg/dL — ABNORMAL HIGH (ref 65–99)

## 2015-09-05 LAB — PROTIME-INR
INR: 3.29 — ABNORMAL HIGH (ref 0.00–1.49)
PROTHROMBIN TIME: 32.8 s — AB (ref 11.6–15.2)

## 2015-09-05 MED ORDER — IPRATROPIUM-ALBUTEROL 0.5-2.5 (3) MG/3ML IN SOLN: 3.0000 mL | Freq: Two times a day (BID) | RESPIRATORY_TRACT | Status: AC

## 2015-09-05 MED ORDER — DIGOXIN 125 MCG PO TABS
0.1250 mg | ORAL_TABLET | Freq: Every day | ORAL | Status: AC
Start: 2015-09-05 — End: ?

## 2015-09-05 MED ORDER — DILTIAZEM HCL 30 MG PO TABS: 30.0000 mg | ORAL_TABLET | Freq: Four times a day (QID) | ORAL | Status: AC

## 2015-09-05 NOTE — NC FL2 (Deleted)
[place] MEDICAID FL2 LEVEL OF CARE SCREENING TOOL     IDENTIFICATION  Patient Name: Tara Park Birthdate: 12/08/1945 Sex: female Admission Date (Current Location): 08/28/2015  County and Medicaid Number:  [place]   Facility and Address:  [facility],  [address], [place] [address]      Provider Number: [id number]  Attending Physician Name and Address:  Richard Dondiego, MD  Relative Name and Phone Number:       Current Level of Care: Hospital Recommended Level of Care: Skilled Nursing Facility Prior Approval Number:    Date Approved/Denied:   PASRR Number:  ( [id number] )  Discharge Plan: SNF    Current Diagnoses: Patient Active Problem List   Diagnosis Date Noted  . New onset a-fib (HCC) 08/28/2015  . A-fib (HCC) 08/28/2015  . Weight gain 08/27/2015  . Edema 08/27/2015  . Subacute confusional state 08/02/2015  . Palpitations 06/14/2015  . Oral thrush 06/14/2015  . UTI (urinary tract infection) 06/13/2015  . Drowsiness 06/12/2015  . Aspiration pneumonia (HCC) 06/11/2015  . Demand ischemia (HCC) 06/11/2015  . Hypokalemia 06/11/2015  . Chronic systolic CHF (congestive heart failure) (HCC) 06/11/2015  . 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 COPD (chronic obstructive pulmonary disease) (HCC) 06/11/2015  . Chronic hypoxemic respiratory failure (HCC) 06/11/2015  . Acute ischemic stroke (HCC) 05/31/2015  . COPD exacerbation (HCC) 05/31/2015  . Tobacco abuse   . Diabetes mellitus without complication (HCC)   . Hypertension   . BURSITIS, HIP 09/09/2007  . TRIGGER FINGER 09/09/2007  . FRACTURE, FEMUR, INTERTROCHANTERIC REGION 09/09/2007  . DIABETES 08/01/2007    Orientation RESPIRATION BLADDER Height & Weight     Self, Time, Situation, Place  O2 (3L) Incontinent Weight: 146 lb 9.7 oz (66.5 kg) Height:  5' 6" (167.6 cm)  BEHAVIORAL SYMPTOMS/MOOD NEUROLOGICAL BOWEL NUTRITION STATUS      Incontinent Diet (Carb Modified.  Fluid Consistency Honey Thick)  AMBULATORY STATUS COMMUNICATION OF NEEDS Skin   Total Care Verbally Normal                       Personal Care Assistance Level of Assistance  Bathing, Feeding, Dressing Bathing Assistance: Maximum assistance Feeding assistance: Maximum assistance Dressing Assistance: Maximum assistance     Functional Limitations Info             SPECIAL CARE FACTORS FREQUENCY                       Contractures      Additional Factors Info  Isolation Precautions, Psychotropic, Insulin Sliding Scale     Psychotropic Info:  (Trazadone) Insulin Sliding Scale Info:  (Daily) Isolation Precautions Info:  (07/11/15 Wound cultures X 2 grew MRSA )     Current Medications (09/05/2015):  This is the current hospital active medication list Current Facility-Administered Medications  Medication Dose Route Frequency Provider Last Rate Last Dose  . ALPRAZolam (XANAX) tablet 0.25 mg  0.25 mg Oral BID PRN Kathryn M Lawrence, NP   0.25 mg at 09/04/15 1017  . ciprofloxacin (CIPRO) tablet 500 mg  500 mg Oral BID Kathryn M Lawrence, NP   500 mg at 09/05/15 0849  . digoxin (LANOXIN) tablet 0.125 mg  0.125 mg Oral Daily Kathryn M Lawrence, NP   0.125 mg at 09/05/15 1040  . diltiazem (CARDIZEM) tablet 30 mg  30 mg Oral 3 times per day Suresh A Koneswaran, MD   30 mg at   09/05/15 0500  . furosemide (LASIX) tablet 20 mg  20 mg Oral Daily Laqueta Linden, MD   20 mg at 09/05/15 1040  . HYDROcodone-acetaminophen (NORCO/VICODIN) 5-325 MG per tablet 1 tablet  1 tablet Oral Q4H PRN Houston Siren, MD   1 tablet at 09/05/15 0327  . insulin aspart (novoLOG) injection 0-15 Units  0-15 Units Subcutaneous TID WC Houston Siren, MD   8 Units at 09/05/15 1259  . insulin aspart (novoLOG) injection 0-5 Units  0-5 Units Subcutaneous QHS Houston Siren, MD   3 Units at 09/03/15 2234  . ipratropium-albuterol (DUONEB) 0.5-2.5 (3) MG/3ML nebulizer solution 3 mL  3 mL Nebulization BID Oval Linsey, MD    3 mL at 09/05/15 0754  . methylPREDNISolone sodium succinate (SOLU-MEDROL) 125 mg/2 mL injection 40 mg  40 mg Intravenous 3 times per day Oval Linsey, MD   40 mg at 09/05/15 0500  . potassium chloride SA (K-DUR,KLOR-CON) CR tablet 40 mEq  40 mEq Oral Daily Houston Siren, MD   40 mEq at 09/05/15 1040  . pravastatin (PRAVACHOL) tablet 80 mg  80 mg Oral QPM Houston Siren, MD   80 mg at 09/04/15 1800  . RESOURCE THICKENUP CLEAR   Oral PRN Oval Linsey, MD   1 Can at 08/29/15 [phone]  . sodium chloride flush (NS) 0.9 % injection 3 mL  3 mL Intravenous Q12H Houston Siren, MD   3 mL at 09/05/15 1000  . traZODone (DESYREL) tablet 50 mg  50 mg Oral QHS PRN Houston Siren, MD   50 mg at 09/02/15 2200  . Warfarin - Pharmacist Dosing Inpatient   Does not apply Q24H Oval Linsey, MD   1 each at 09/04/15 1600     Discharge Medications: Please see discharge summary for a list of discharge medications.  Relevant Imaging Results:  Relevant Lab Results:   Additional Information SSN [id number]  Annice Needy, LCSW

## 2015-09-05 NOTE — Final Progress Note (Addendum)
Report given to Rena at the [facility].  She verbalized understanding and I voiced to her to have the receiving nurse call me with any questions at [phone].  Patient will eat her dinner and be transferred via stretcher with packet  She currently is in stable condition.

## 2015-09-05 NOTE — Progress Notes (Signed)
Inpatient Diabetes Program Recommendations  AACE/ADA: New Consensus Statement on Inpatient Glycemic Control (2015)  Target Ranges:  Prepandial:   less than 140 mg/dL      Peak postprandial:   less than 180 mg/dL (1-2 hours)      Critically ill patients:  140 - 180 mg/dL   Review of Glycemic Control  Inpatient Diabetes Program Recommendations:    While on solumedrol, please consider addition of basal lantus or levemir at 10 units daily or HS.  Thank you Lenor Coffin, RN, MSN, CDE  Diabetes Inpatient Program Office: [phone] Pager: [phone] 8:00 am to 5:00 pm

## 2015-09-05 NOTE — Progress Notes (Signed)
ANTICOAGULATION CONSULT NOTE   Pharmacy Consult for Coumadin Indication: atrial fibrillation  No Known Allergies  Patient Measurements: Height:  (167.6 cm) Weight: 146 lb 9.7 oz (66.5 kg) IBW/kg (Calculated) : 59.3 HEPARIN DW (KG): 67.7  Vital Signs: Temp: 97.8 F (36.6 C) (02/13 0545) Temp Source: Oral (02/13 0545) BP: 159/89 mmHg (02/13 0545) Pulse Rate: 85 (02/13 0545)  Labs:  Recent Labs  09/03/15 0644 09/04/15 0605 09/05/15 0603  LABPROT 30.0* 29.9* 32.8*  INR 2.92* 2.91* 3.29*    Estimated Creatinine Clearance: 60.4 mL/min (by C-G formula based on Cr of 0.36).  Medical History: Past Medical History  Diagnosis Date  . Diabetes mellitus without complication (HCC)   . Hypertension   . Tobacco abuse   . Use of cane as ambulatory aid   . Leg edema, right     chronic  . Stroke (HCC)   . CHF (congestive heart failure) (HCC)   . COPD (chronic obstructive pulmonary disease) (HCC)     on O2  . Anxiety    Medications:  See medication history  Assessment: 70 yo lady with new onset afib bridged with heparin and now only on coumadin. No bleeding noted. INR is therapeutic, 2.89. INR 2.1-->2.89(significant jump). Initially given 7.5mg  then , then decreased to 2.5mg . Gave 1.25mg  past few days and will likely need 2.5mg  daily. INR=3.29 today.   Goal of Therapy:  INR 2-3 Monitor platelets by anticoagulation protocol: Yes   Plan:  No Coumadin  today PT/INR and CBC daily Monitor for bleeding complications  Thanks for allowing pharmacy to be a part of this patient's care.  Elder Cyphers, BS Loura Back, BCPS Clinical Pharmacist Pager [phone] 09/05/2015,9:43 AM

## 2015-09-05 NOTE — Care Management Important Message (Signed)
Important Message  Patient Details  Name: Tara Park MRN: [id number] Date of Birth: [birth year]   Medicare Important Message Given:  Yes    Adonis Huguenin, RN 09/05/2015, 1:11 PM

## 2015-09-05 NOTE — NC FL2 (Signed)
[place] MEDICAID FL2 LEVEL OF CARE SCREENING TOOL     IDENTIFICATION  Patient Name: Tara Park Birthdate: January 30, 1946 Sex: female Admission Date (Current Location): 08/28/2015  [facility] and [place] Number:  [employer] and Address:  [facility],  [address] [place] [address]      Provider Number: [id number]  Attending Physician Name and Address:  Oval Linsey, MD  Relative Name and Phone Number:       Current Level of Care: Hospital Recommended Level of Care: Skilled Nursing Facility Prior Approval Number:    Date Approved/Denied:   PASRR Number:  ( [id number] )  Discharge Plan: SNF    Current Diagnoses: Patient Active Problem List   Diagnosis Date Noted  . New onset a-fib (HCC) 08/28/2015  . A-fib (HCC) 08/28/2015  . Weight gain 08/27/2015  . Edema 08/27/2015  . Subacute confusional state 08/02/2015  . Palpitations 06/14/2015  . Oral thrush 06/14/2015  . UTI (urinary tract infection) 06/13/2015  . Drowsiness 06/12/2015  . Aspiration pneumonia (HCC) 06/11/2015  . Demand ischemia (HCC) 06/11/2015  . Hypokalemia 06/11/2015  . Chronic systolic CHF (congestive heart failure) (HCC) 06/11/2015  . 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 COPD (chronic obstructive pulmonary disease) (HCC) 06/11/2015  . Chronic hypoxemic respiratory failure (HCC) 06/11/2015  . Acute ischemic stroke (HCC) 05/31/2015  . COPD exacerbation (HCC) 05/31/2015  . Tobacco abuse   . Diabetes mellitus without complication (HCC)   . Hypertension   . BURSITIS, HIP 09/09/2007  . TRIGGER FINGER 09/09/2007  . FRACTURE, FEMUR, INTERTROCHANTERIC REGION 09/09/2007  . DIABETES 08/01/2007    Orientation RESPIRATION BLADDER Height & Weight     Self, Time, Situation, Place  O2 (3L) Incontinent Weight: 146 lb 9.7 oz (66.5 kg) Height:   (167.6 cm)  BEHAVIORAL SYMPTOMS/MOOD NEUROLOGICAL BOWEL NUTRITION STATUS      Incontinent Diet (Carb Modified.  Fluid Consistency Honey Thick)  AMBULATORY STATUS COMMUNICATION OF NEEDS Skin   Total Care Verbally Normal                       Personal Care Assistance Level of Assistance  Bathing, Feeding, Dressing Bathing Assistance: Maximum assistance Feeding assistance: Maximum assistance Dressing Assistance: Maximum assistance     Functional Limitations Info             SPECIAL CARE FACTORS FREQUENCY                       Contractures      Additional Factors Info  Isolation Precautions, Psychotropic, Insulin Sliding Scale     Psychotropic Info:  (Trazadone) Insulin Sliding Scale Info:  (Daily) Isolation Precautions Info:  (07/11/15 Wound cultures X 2 grew MRSA )     Current Medications (09/05/2015):  This is the current hospital active medication list Current Facility-Administered Medications  Medication Dose Route Frequency Provider Last Rate Last Dose  . ALPRAZolam Prudy Feeler) tablet 0.25 mg  0.25 mg Oral BID PRN Jodelle Gross, NP   0.25 mg at 09/04/15 1017  . ciprofloxacin (CIPRO) tablet 500 mg  500 mg Oral BID Jodelle Gross, NP   500 mg at 09/05/15 0849  . digoxin (LANOXIN) tablet 0.125 mg  0.125 mg Oral Daily Jodelle Gross, NP   0.125 mg at 09/05/15 1040  . diltiazem (CARDIZEM) tablet 30 mg  30 mg Oral 3 times per day Laqueta Linden, MD   30 mg at  09/05/15 0500  . furosemide (LASIX) tablet 20 mg  20 mg Oral Daily Laqueta Linden, MD   20 mg at 09/05/15 1040  . HYDROcodone-acetaminophen (NORCO/VICODIN) 5-325 MG per tablet 1 tablet  1 tablet Oral Q4H PRN Houston Siren, MD   1 tablet at 09/05/15 0327  . insulin aspart (novoLOG) injection 0-15 Units  0-15 Units Subcutaneous TID WC Houston Siren, MD   8 Units at 09/05/15 1259  . insulin aspart (novoLOG) injection 0-5 Units  0-5 Units Subcutaneous QHS Houston Siren, MD   3 Units at 09/03/15 2234  . ipratropium-albuterol (DUONEB) 0.5-2.5 (3) MG/3ML nebulizer solution 3 mL  3 mL Nebulization BID Oval Linsey, MD    3 mL at 09/05/15 0754  . methylPREDNISolone sodium succinate (SOLU-MEDROL) 125 mg/2 mL injection 40 mg  40 mg Intravenous 3 times per day Oval Linsey, MD   40 mg at 09/05/15 0500  . potassium chloride SA (K-DUR,KLOR-CON) CR tablet 40 mEq  40 mEq Oral Daily Houston Siren, MD   40 mEq at 09/05/15 1040  . pravastatin (PRAVACHOL) tablet 80 mg  80 mg Oral QPM Houston Siren, MD   80 mg at 09/04/15 1800  . RESOURCE THICKENUP CLEAR   Oral PRN Oval Linsey, MD   1 Can at 08/29/15 [phone]  . sodium chloride flush (NS) 0.9 % injection 3 mL  3 mL Intravenous Q12H Houston Siren, MD   3 mL at 09/05/15 1000  . traZODone (DESYREL) tablet 50 mg  50 mg Oral QHS PRN Houston Siren, MD   50 mg at 09/02/15 2200  . Warfarin - Pharmacist Dosing Inpatient   Does not apply Q24H Oval Linsey, MD   1 each at 09/04/15 1600     Discharge Medications: Please see discharge summary for a list of discharge medications.  Relevant Imaging Results:  Relevant Lab Results:   Additional Information SSN [id number]  Annice Needy, LCSW

## 2015-09-05 NOTE — Clinical Social Work Note (Signed)
CSW facilitated discharge.   CSW notified Keri at [facility] that patient was discharging and would return to [facility].  CSW left messages for both of patient's adult children, Kathlene November and [name], advising that patient was being discharged today and would be transported to [facility] via [facility] staff.   CSW signing off.    Annice Needy, [place] [phone]

## 2015-09-05 NOTE — Discharge Summary (Signed)
Physician Discharge Summary  Tara Park JXB:[id number] DOB: 1946/05/01 DOA: 08/28/2015  PCP: Isabella Stalling, MD  Admit date: 08/28/2015 Discharge date: 09/05/2015   Recommendations for Outpatient Follow-up:  Patient is discharged to skilled nursing for send skilled nursing facility pending center where she came from aggressive outpatient physical rehabilitation due to previous CVA and generalized debility metatarsal with a paroxysmal A. fib which converted within 10 hours to normal sinus rhythm she was hemodynamically stable subsequent to initiating episode of bronchospasm presumably induced by the addition of beta blockade in the form of Lopressor she she had her AV nodal with the addition of verapamil as well as digoxin 0.125 mg therefore his Lopressor was withdrawn her bronchospasm resolved over 3-4 day. So point discharged with oral steroids to the pen nursing center to follow-up on this she was was elected not to chronically anticoagulate her she is grossly unsteady on her feet major risk to fall was not in favor with a 10 hour episode approximately 50 that rapidly converted she had normal systolic function on echocardiogram and normal chamber dimensions Discharge Diagnoses:  Active Problems:   Tobacco abuse   Diabetes mellitus without complication (HCC)   Hypertension   COPD exacerbation (HCC)   Hypokalemia   Chronic systolic CHF (congestive heart failure) (HCC)   New onset a-fib (HCC)   A-fib (HCC)   Discharge Condition: Good and improving  Filed Weights   08/29/15 0500 08/30/15 0500 08/31/15 0500  Weight: 151 lb 10.8 oz (68.8 kg) 149 lb 0.5 oz (67.6 kg) 146 lb 9.7 oz (66.5 kg)    History of present illness:  See outpatient recommendations above  Hospital Course:  See outpatient recommendations above  Procedures:    Consultations:  Cardiology  Discharge Instructions  Discharge Instructions    Discharge instructions    Complete by:  As directed      Discharge patient    Complete by:  As directed             Medication List    STOP taking these medications        furosemide 20 MG tablet  Commonly known as:  LASIX     NON FORMULARY     nystatin 100000 UNIT/ML suspension  Commonly known as:  MYCOSTATIN     potassium chloride 10 MEQ tablet  Commonly known as:  K-DUR      TAKE these medications        albuterol (2.5 MG/3ML) 0.083% nebulizer solution  Commonly known as:  PROVENTIL  Take 2.5 mg by nebulization 3 (three) times daily. *may be given every 2 hours as needed for breathing/shortness of breath     aspirin EC 81 MG tablet  Take 81 mg by mouth daily.     clopidogrel 75 MG tablet  Commonly known as:  PLAVIX  Take 1 tablet (75 mg total) by mouth daily with breakfast.     clotrimazole 1 % cream  Commonly known as:  LOTRIMIN  Apply 1 application topically 4 (four) times daily as needed (to corners of mouth/lips as needed).     digoxin 0.125 MG tablet  Commonly known as:  LANOXIN  Take 1 tablet (0.125 mg total) by mouth daily.     diltiazem 30 MG tablet  Commonly known as:  CARDIZEM  Take 1 tablet (30 mg total) by mouth 4 (four) times daily.     feeding supplement (PRO-STAT 64) Liqd  Take 30 mLs by mouth 2 (two) times daily.  guaiFENesin 600 MG 12 hr tablet  Commonly known as:  MUCINEX  Take 1 tablet (600 mg total) by mouth 2 (two) times daily.     HYDROcodone-acetaminophen 5-325 MG tablet  Commonly known as:  NORCO  Take one tablet by mouth every 6 hours as needed for pain. Max APAP 3gm/24hrs from all sources     ipratropium-albuterol 0.5-2.5 (3) MG/3ML Soln  Commonly known as:  DUONEB  Take 3 mLs by nebulization 2 (two) times daily.     Melatonin 3 MG Tabs  Take 3 mg by mouth at bedtime.     metFORMIN 500 MG tablet  Commonly known as:  GLUCOPHAGE  Take 500 mg by mouth 2 (two) times daily with a meal.     pravastatin 40 MG tablet  Commonly known as:  PRAVACHOL  Take 2 tablets (80 mg total)  by mouth every evening.     predniSONE 10 MG tablet  Commonly known as:  DELTASONE  Take 40 mg for 4 days, then Take 30 mg for 3 days, then Take 20 mg for 3 days, Take 10 mg for 3 days, then stop.40     senna-docusate 8.6-50 MG tablet  Commonly known as:  Senokot-S  Take 1 tablet by mouth at bedtime as needed for mild constipation.     traZODone 50 MG tablet  Commonly known as:  DESYREL  Take 1 tablet (50 mg total) by mouth at bedtime as needed for sleep.       No Known Allergies    The results of significant diagnostics from this hospitalization (including imaging, microbiology, ancillary and laboratory) are listed below for reference.    Significant Diagnostic Studies: Dg Chest 1 View  08/26/2015  CLINICAL DATA:  Chest congestion for several weeks. EXAM: CHEST 1 VIEW COMPARISON:  June 20, 2015. FINDINGS: The heart size and mediastinal contours are within normal limits. No pneumothorax or pleural effusion is noted. Mild right basilar subsegmental atelectasis is noted. Left lung is clear. The visualized skeletal structures are unremarkable. IMPRESSION: Mild right basilar subsegmental atelectasis. Electronically Signed   By: Lupita Raider, M.D.   On: 08/26/2015 17:31   US Venous Img Lower Unilateral Right  08/26/2015  CLINICAL DATA:  Right lower extremity pain and swelling for 1 day. EXAM: RIGHT LOWER EXTREMITY VENOUS DOPPLER ULTRASOUND TECHNIQUE: Gray-scale sonography with graded compression, as well as color Doppler and duplex ultrasound were performed to evaluate the lower extremity deep venous systems from the level of the common femoral vein and including the common femoral, femoral, profunda femoral, popliteal and calf veins including the posterior tibial, peroneal and gastrocnemius veins when visible. The superficial great saphenous vein was also interrogated. Spectral Doppler was utilized to evaluate flow at rest and with distal augmentation maneuvers in the common femoral,  femoral and popliteal veins. COMPARISON:  None. FINDINGS: Contralateral Common Femoral Vein: Respiratory phasicity is normal and symmetric with the symptomatic side. No evidence of thrombus. Normal compressibility. Common Femoral Vein: No evidence of thrombus. Normal compressibility, respiratory phasicity and response to augmentation. Saphenofemoral Junction: No evidence of thrombus. Normal compressibility and flow on color Doppler imaging. Profunda Femoral Vein: No evidence of thrombus. Normal compressibility and flow on color Doppler imaging. Femoral Vein: No evidence of thrombus. Normal compressibility, respiratory phasicity and response to augmentation. Popliteal Vein: No evidence of thrombus. Normal compressibility, respiratory phasicity and response to augmentation. Calf Veins: No evidence of thrombus. Normal compressibility and flow on color Doppler imaging. Superficial Great Saphenous Vein: No evidence of  thrombus. Normal compressibility and flow on color Doppler imaging. Venous Reflux:  None. Other Findings:  None. IMPRESSION: No evidence of deep venous thrombosis. Electronically Signed   By: Annia Belt M.D.   On: 08/26/2015 17:47   Dg Chest Port 1 View  08/30/2015  CLINICAL DATA:  Acute onset of shortness of breath. Initial encounter. EXAM: PORTABLE CHEST 1 VIEW COMPARISON:  Chest radiograph 08/28/2015 FINDINGS: The lungs are well-aerated. Vascular congestion is noted, with minimal bilateral atelectasis. No pleural effusion or pneumothorax is seen. There The cardiomediastinal silhouette is within normal limits. No acute osseous abnormalities are seen. Density overlying the right lung apex appears to be artifactual in nature, as it is not seen on the recent prior study. IMPRESSION: Vascular congestion, with minimal bilateral atelectasis. Electronically Signed   By: [name] M.D.   On: 08/30/2015 05:33   Dg Chest Port 1 View  08/28/2015  CLINICAL DATA:  Initial evaluation for acute wheezing, leg  swelling. EXAM: PORTABLE CHEST 1 VIEW COMPARISON:  Prior study from 08/25/2014. FINDINGS: Examination is somewhat suboptimal due to patient positioning and shallow lung inflation. Cardiac and mediastinal silhouettes grossly stable, and remain within normal limits. Lungs are hypoinflated. Patchy bibasilar opacities, right greater than left, favored to reflect atelectasis. No pulmonary edema. No definite pleural effusion. No pneumothorax. No DOS abnormality. IMPRESSION: Shallow lung inflation with bibasilar linear opacities, right greater than left. Atelectasis is favored. No other definite active cardiopulmonary disease. Electronically Signed   By: Rise Mu M.D.   On: 08/28/2015 17:54    Microbiology: Recent Results (from the past 240 hour(s))  Urine culture     Status: None   Collection Time: 08/28/15  7:41 PM  Result Value Ref Range Status   Specimen Description URINE, CATHETERIZED  Final   Special Requests NONE  Final   Culture   Final    >=100,000 COLONIES/mL ESCHERICHIA COLI Performed at [facility]    Report Status 08/31/2015 FINAL  Final   Organism ID, Bacteria ESCHERICHIA COLI  Final      Susceptibility   Escherichia coli - MIC*    AMPICILLIN >=32 RESISTANT Resistant     CEFAZOLIN <=4 SENSITIVE Sensitive     CEFTRIAXONE <=1 SENSITIVE Sensitive     CIPROFLOXACIN <=0.25 SENSITIVE Sensitive     GENTAMICIN <=1 SENSITIVE Sensitive     IMIPENEM <=0.25 SENSITIVE Sensitive     NITROFURANTOIN <=16 SENSITIVE Sensitive     TRIMETH/SULFA 160 RESISTANT Resistant     AMPICILLIN/SULBACTAM >=32 RESISTANT Resistant     PIP/TAZO 64 INTERMEDIATE Intermediate     * >=100,000 COLONIES/mL ESCHERICHIA COLI  MRSA PCR Screening     Status: None   Collection Time: 08/28/15 10:40 PM  Result Value Ref Range Status   MRSA by PCR NEGATIVE NEGATIVE Final    Comment:        The GeneXpert MRSA Assay (FDA approved for NASAL specimens only), is one component of a comprehensive MRSA  colonization surveillance program. It is not intended to diagnose MRSA infection nor to guide or monitor treatment for MRSA infections.      Labs: Basic Metabolic Panel:  Recent Labs Lab 08/30/15 0412 08/31/15 0453 09/01/15 0645  NA 139 139 141  K 3.7 2.9* 3.9  CL 102 104 107  CO2 GLUCOSE 167* 135* 203*  BUN CREATININE 0.43* <0.30* 0.36*  CALCIUM 8.2* 7.7* 8.2*   Liver Function Tests: No results for input(s): AST, ALT, ALKPHOS, BILITOT,  PROT, ALBUMIN in the last 168 hours. No results for input(s): LIPASE, AMYLASE in the last 168 hours. No results for input(s): AMMONIA in the last 168 hours. CBC:  Recent Labs Lab 08/30/15 0412 08/31/15 0453 08/31/15 1532 09/01/15 0645 09/02/15 0644  WBC 14.8* 8.6 9.2 5.2 12.6*  HGB 10.1* 9.4* 9.8* 9.4* 9.7*  HCT 31.9* 29.1* 30.8* 28.9* 29.7*  MCV 83.9 83.6 83.9 83.5 82.7  PLT 192 158 160 163 208   Cardiac Enzymes:  Recent Labs Lab 08/29/15 1358 08/29/15 2018  TROPONINI <0.03 <0.03   BNP: BNP (last 3 results)  Recent Labs  06/01/15 0618 06/11/15 0953 08/28/15 1740  BNP 203.0* 193.0* 131.0*    ProBNP (last 3 results) No results for input(s): PROBNP in the last 8760 hours.  CBG:  Recent Labs Lab 09/04/15 1144 09/04/15 1649 09/04/15 2042 09/05/15 0750 09/05/15 1119  GLUCAP 295* 235* 176* 199* 258*       Signed:  Khaza Blansett M  [facility] Pager: [phone] 09/05/2015, 12:53 PM

## 2015-09-06 ENCOUNTER — Non-Acute Institutional Stay (SKILLED_NURSING_FACILITY): Payer: Medicare Other | Admitting: Internal Medicine

## 2015-09-06 ENCOUNTER — Other Ambulatory Visit: Payer: Self-pay | Admitting: *Deleted

## 2015-09-06 ENCOUNTER — Encounter (HOSPITAL_COMMUNITY)
Admission: AD | Admit: 2015-09-06 | Discharge: 2015-09-06 | Disposition: A | Discharge status: Home / Self Care | Payer: Medicare Other | Source: Skilled Nursing Facility | Attending: Internal Medicine | Admitting: Internal Medicine

## 2015-09-06 DIAGNOSIS — D72829 Elevated white blood cell count, unspecified: Secondary | ICD-10-CM | POA: Diagnosis not present

## 2015-09-06 DIAGNOSIS — I482 Chronic atrial fibrillation, unspecified: Secondary | ICD-10-CM

## 2015-09-06 DIAGNOSIS — J42 Unspecified chronic bronchitis: Secondary | ICD-10-CM | POA: Diagnosis not present

## 2015-09-06 DIAGNOSIS — E876 Hypokalemia: Secondary | ICD-10-CM

## 2015-09-06 LAB — URINALYSIS, ROUTINE W REFLEX MICROSCOPIC
Bilirubin Urine: NEGATIVE
Glucose, UA: NEGATIVE mg/dL
Leukocytes, UA: NEGATIVE
NITRITE: POSITIVE — AB
PH: 6 (ref 5.0–8.0)
PROTEIN: NEGATIVE mg/dL
SPECIFIC GRAVITY, URINE: 1.015 (ref 1.005–1.030)

## 2015-09-06 LAB — BASIC METABOLIC PANEL
ANION GAP: 9 (ref 5–15)
BUN: 25 mg/dL — ABNORMAL HIGH (ref 6–20)
CHLORIDE: 104 mmol/L (ref 101–111)
CO2: 27 mmol/L (ref 22–32)
Calcium: 7.8 mg/dL — ABNORMAL LOW (ref 8.9–10.3)
Creatinine, Ser: 0.48 mg/dL (ref 0.44–1.00)
GFR calc Af Amer: >60 mL/min (ref >60)
GFR calc non Af Amer: >60 mL/min (ref >60)
GLUCOSE: 169 mg/dL — AB (ref 65–99)
POTASSIUM: 2.9 mmol/L — AB (ref 3.5–5.1)
Sodium: 140 mmol/L (ref 135–145)

## 2015-09-06 LAB — CBC
HEMATOCRIT: 31 % — AB (ref 36.0–46.0)
HEMOGLOBIN: 10.3 g/dL — AB (ref 12.0–15.0)
MCH: 27.2 pg (ref 26.0–34.0)
MCHC: 33.2 g/dL (ref 30.0–36.0)
MCV: 82 fL (ref 78.0–100.0)
Platelets: 255 10*3/uL (ref 150–400)
RBC: 3.78 MIL/uL — ABNORMAL LOW (ref 3.87–5.11)
RDW: 15.9 % — ABNORMAL HIGH (ref 11.5–15.5)
WBC: 20 10*3/uL — ABNORMAL HIGH (ref 4.0–10.5)

## 2015-09-06 LAB — URINE MICROSCOPIC-ADD ON

## 2015-09-06 MED ORDER — HYDROCODONE-ACETAMINOPHEN 5-325 MG PO TABS: ORAL_TABLET | ORAL | Status: DC

## 2015-09-06 NOTE — Telephone Encounter (Signed)
[facility] 

## 2015-09-06 NOTE — Progress Notes (Signed)
Patient ID: LEWANNA PETRAK, female   DOB: 08/18/1945, 70 y.o.   MRN: [id number] Patient ID: JANEQUA KIPNIS, female   DOB: [birth year], 70 y.o.   MRN: [id number]                PROGRESS NOTE  DATE:  09/06/2015        FACILITY: [facility]              LEVEL OF CARE:   SNF   Acute Visit             CHIEF COMPLAINT: Acute visit status post hospitalization for atrial fibrillation question leg edema.  Marland Kitchen    HISTORY OF PRESENT ILLNESS:  Mrs. Hawkey is a lady with oxygen-dependent COPD.  She came here after a series of admissions to [facility] in November 2016 with COPD acute.    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    She has a history of diastolic heart failure and a recent echocardiogram on 06/12/2015 showed an EF of 70-75%, grade 1 diastolic dysfunction.  No significant valve disease.  This was a marked improvement from an echo on 06/02/2015 that showed an EF of 25-30%.  I think she was felt to have some form of stress cardiomyopathy.  Patient was recently sent back to the hospital apparently presenting to nursing with increased leg edema however it appears she was admitted actually more for atrial fibrillation with rapid ventricular response.  Apparently her A. fib converted within 10 hours, sinus rhythm.  There was an addition of Lopressor but apparently she had bronchospasms which complicated matters--her Lopressor was discontinued and her bronchospasm resolved over 3 or 4 days.        diltiazem was increased it appears from 3 times a day to 4 times a day I suspect to her atrial fibrillation which again appears to be controlled.  She was not aggressively anticoagulated since she has a significant fall risk.  Currently she continues on digoxin diltiazem 4 times a day she is also on Plavix with a history of CVA.  In regards to COPD she continues on Proventil nebulizers 3 times a day as well as duo nebs 2 times a day she is  also on a prednisone taper.  Currently patient does not have any acute complaints she denies any increase shortness of breath from baseline although she continues to be quite frail appearing.  It on a labs today her potassium is low at 2.9 she is not on potassium supplementation-I also note her white count is 20,000 appears it had been slowly trending up to the hospital again this may at least partially be due to her steroids she is not really complaining any fever chills dysuria or increased cough or congestion.  She did have an chest x-ray in the hospital on February 7 which showed vascular congestion.  Apparently a urinalysis and culture on February 5 did show greater than 100,000 colonies of Escherichia coli that apparently was treated  Previous medical history   Diagnosis Date  . Diabetes mellitus without complication (HCC)   . Hypertension   . Tobacco abuse   . Use of cane as ambulatory aid   . Leg edema, right     chronic  . Stroke (HCC)   . CHF (congestive heart failure) (HCC)   . COPD (chronic obstructive pulmonary disease) (HCC)     on O2  . Anxiety      PAST SURGICAL HISTORY:  Past Surgical History  Procedure Laterality Date  . Cataract extraction    . Hip surgery    . Foot surgery        CURRENT MEDICATIONS:  Medication list is reviewed.           Aspirin 81 q.d.      Plavix 75 q.d.     Lasix 20 q.d.    Hydrocodone 5/325 q.6 hours p.r.n.     Metformin 500 b.i.d.    .    Albuterol nebulizers 2.5 three times a day and p.r.n.     Pravastatin 40 q.d.      Trazodone 50 q.d.  Digoxin 0.125 mg daily     Cardizem 30 mg 4 times a day.    Mucinex 600 mg twice a day.    Duo nebs twice a day.    Melatonin 3 mg daily at bedtime.    Prednisone taper starting at 40 mg for 4 days and 30 mg for 3 days 20 mg for 3 days then 10 mg for 3 days.       LABORATORY DATA:   09/01/2015.  Sodium 141 potassium 3.9 BUN 15 creatinine  0.36.  09/02/2015.  WBC 12.6 hemoglobin 9.7 platelets 208.  BNPn 08/28/2015 was 131.    REVIEW OF SYSTEMS:    GENERAL:   Not complaining of any fever or chills does appear weak.  Skin does not complain of rashes or itching as lower extremity wounds followed by wound care HEENT: Does not complain of visual changes or sore throat   CHEST/RESPIRATORY:   Says her breathing is baseline does not complain of cough or increased shortness of breath beyond baseline     CARDIAC:  No clear chest pain or palpitations.     GI:  No abdominal pain.   No nausea or vomiting.   GU:  No dysuria.   Muscle skeletal has significant lower extremity weakness but does not complain of pain currently.  Neurologic does not complain of dizziness headache or numbness currently  CIRCULATION:  Is with moderate lower extremity edema.   PSYCHIATRIC: Has had significant confusion in the past but this appears to have stabilized.     PHYSICAL EXAMINATION:   VITAL SIGNS:      Temperature 98.1 pulse 68 respirations 21 blood pressure 138/98 O2 saturation is 91%   GENERAL APPEARANCE:   Somewhat frail elderly appearing female in no distress she is pleasant and appropriate.  Her skin is warm and dry in her right leg shin area she does have a wound that appears to have good granulation no sign of infection --this is followed by wound care.   Oro pharynx-appears she does have a slight white film on her tongue CHEST/RESPIRATORY:  Upper airway sounds.  Mildly prolonged expiratory phase, no overt congestion or labored breathing  CARDIOVASCULAR:   CARDIAC:  Heart sounds are normal.  JVP is elevated.       GASTROINTESTINAL:   ABDOMEN:  Soft and tender with positive bowel sounds.      Marland Kitchen    CIRCULATION:   EDEMA/VARICOSITIES: Has had with say one plus edema lower extremities bilaterally also some edema of her left hand which apparently is chronic this is cold to touch non-erythematous radial pulses intact     NEUROLOGICAL:     SENSATION/STRENGTH:  Chronic left-sided paresis in the arm greater than leg.   PSYCHIATRIC:   MENTAL STATUS: She is alert and appropriate today \\Labs .  09/06/2015.  Sodium 140 potassium 2.9  BUN 25 creatinine 0.48.  WBC 20.0 hemoglobin 10.3 platelets 255.  09/02/2015.  WBC 12.6 hemoglobin 9.7 platelets 208.      ASSESSMENT/PLAN:     Atrial fibrillation-at this point appears to be rate controlled she is on digoxin-as well as Cardizem 30 mg 4 times a day she did not tolerate Lopressor in the hospital secondary to bronchospasms-not a good aggressive anticoagulation candidate secondary to falls she is on aspirin 81 mg today                 History of diastolic CHF-she does continue on the digoxin not a good candidate for beta blocker as noted above currently not on a diuretic this will have to be monitored however at this point appears relatively stable will have to monitor her weights carefully.                                        COPD.  She is on routine nebulizers --at this point appears relatively stable --but a fragile individual in this regards she is also on a prednisone taper  \Leukocytosis-this may at least partially be due to the steroids-she does not complain of any dysuria or increased cough or congestion fever or chills-we'll check a UA C&S-as well as a CBC with differential tomorrow--appears she did have a UTI that was treated in the hospital        Hypokalemia-potassium of 2.9 on lab today Will treat aggressively with 40 mEq potassium now in an additional 40 mEq tonight and recheck a metabolic panel here tomorrow                                                                               3 of diabetes type 2-she is on Glucophage twice a day at this point will monitor CBGs.  History CVA again she does continue with left-sided weakness as noted above she is on Plavix as well as aspirin and a statin     Thrush-will treat with a course of nystatin and monitor  for resolution                                                                                                                                                          [id number]-of note greater than 40 minutes spent assessing patient-discussing her status with nursing staff-reviewing her chart-and coordinating and formulating a plan of care for numerous diagnoses-of note  greater than 50% of time spent coordinating plan.

## 2015-09-07 ENCOUNTER — Encounter: Payer: Self-pay | Admitting: Internal Medicine

## 2015-09-07 ENCOUNTER — Non-Acute Institutional Stay (SKILLED_NURSING_FACILITY): Payer: Medicare Other | Admitting: Internal Medicine

## 2015-09-07 ENCOUNTER — Encounter (HOSPITAL_COMMUNITY)
Admission: AD | Admit: 2015-09-07 | Discharge: 2015-09-07 | Disposition: A | Discharge status: Home / Self Care | Payer: Medicare Other | Source: Skilled Nursing Facility | Attending: Internal Medicine | Admitting: Internal Medicine

## 2015-09-07 DIAGNOSIS — R197 Diarrhea, unspecified: Secondary | ICD-10-CM | POA: Diagnosis not present

## 2015-09-07 DIAGNOSIS — E876 Hypokalemia: Secondary | ICD-10-CM | POA: Diagnosis not present

## 2015-09-07 DIAGNOSIS — I4891 Unspecified atrial fibrillation: Secondary | ICD-10-CM

## 2015-09-07 DIAGNOSIS — D72829 Elevated white blood cell count, unspecified: Secondary | ICD-10-CM

## 2015-09-07 LAB — CBC WITH DIFFERENTIAL/PLATELET
BASOS ABS: 0 10*3/uL (ref 0.0–0.1)
BASOS PCT: 0 %
EOS PCT: 0 %
Eosinophils Absolute: 0 10*3/uL (ref 0.0–0.7)
HEMATOCRIT: 33 % — AB (ref 36.0–46.0)
Hemoglobin: 10.8 g/dL — ABNORMAL LOW (ref 12.0–15.0)
LYMPHS PCT: 4 %
Lymphs Abs: 0.9 10*3/uL (ref 0.7–4.0)
MCH: 27.1 pg (ref 26.0–34.0)
MCHC: 32.7 g/dL (ref 30.0–36.0)
MCV: 82.9 fL (ref 78.0–100.0)
Monocytes Absolute: 0.7 10*3/uL (ref 0.1–1.0)
Monocytes Relative: 3 %
NEUTROS ABS: 21.5 10*3/uL — AB (ref 1.7–7.7)
Neutrophils Relative %: 93 %
PLATELETS: 244 10*3/uL (ref 150–400)
RBC: 3.98 MIL/uL (ref 3.87–5.11)
RDW: 16.7 % — AB (ref 11.5–15.5)
WBC: 23.2 10*3/uL — AB (ref 4.0–10.5)

## 2015-09-07 LAB — BASIC METABOLIC PANEL
ANION GAP: 9 (ref 5–15)
BUN: 25 mg/dL — ABNORMAL HIGH (ref 6–20)
CO2: 27 mmol/L (ref 22–32)
Calcium: 7.9 mg/dL — ABNORMAL LOW (ref 8.9–10.3)
Chloride: 104 mmol/L (ref 101–111)
Creatinine, Ser: 0.4 mg/dL — ABNORMAL LOW (ref 0.44–1.00)
GFR calc Af Amer: >60 mL/min (ref >60)
Glucose, Bld: 137 mg/dL — ABNORMAL HIGH (ref 65–99)
POTASSIUM: 4.4 mmol/L (ref 3.5–5.1)
SODIUM: 140 mmol/L (ref 135–145)

## 2015-09-07 NOTE — Progress Notes (Signed)
Patient ID: Tara Park, female   DOB: 10/12/1945, 70 y.o.   MRN: [id number]                  PROGRESS NOTE  DATE:  09/07/2015        FACILITY: [facility]              LEVEL OF CARE:   SNF   Acute Visit             CHIEF COMPLAINT: Acute visit secondary to leukocytosis-diarrhea  .    HISTORY OF PRESENT ILLNESS:  Tara Park is a lady with oxygen-dependent COPD.  She came here after a series of admissions to [facility] in November 2016 with COPD acute.    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    She has a history of diastolic heart failure a   Patient was recently sent back to the hospital apparently presenting to nursing with increased leg edema however it appears she was admitted actually more for atrial fibrillation with rapid ventricular response.  Apparently her A. fib converted within 10 hours, sinus rhythm.  There was an addition of Lopressor but apparently she had bronchospasms which complicated matters--her Lopressor was discontinued and her bronchospasm resolved over 3 or 4 days.        diltiazem was increased it appears from 3 times a day to 4 times a day I suspect to her atrial fibrillation which again appears to be controlled.  Most acute issue recently has been leukocytosis of unclear etiology previous white count was 20,000 after her admission to the facility-it has gone up to 23,200 as of today. Note she is on a prednisone taper for history of COPD exasperation  She has been afebrile-patient has not complained of any abdominal pain or dysuria     Apparently a urinalysis and culture on February 5 did show greater than 100,000 colonies of Escherichia coli that apparently was treated while she was in the hospital.  Patient has denied diarrhea however her nursing tech today says she had profuse diarrhea earlier today that "covered the entire bed".  She does not really complain of any abdominal  discomfort    Previous medical history   Diagnosis Date  . Diabetes mellitus without complication (HCC)   . Hypertension   . Tobacco abuse   . Use of cane as ambulatory aid   . Leg edema, right     chronic  . Stroke (HCC)   . CHF (congestive heart failure) (HCC)   . COPD (chronic obstructive pulmonary disease) (HCC)     on O2  . Anxiety      PAST SURGICAL HISTORY:  Past Surgical History  Procedure Laterality Date  . Cataract extraction    . Hip surgery    . Foot surgery        CURRENT MEDICATIONS:  Medication list is reviewed.           Aspirin 81 q.d.      Plavix 75 q.d.     Lasix 20 q.d.    Hydrocodone 5/325 q.6 hours p.r.n.     Metformin 500 b.i.d.    .    Albuterol nebulizers 2.5 three times a day and p.r.n.     Pravastatin 40 q.d.      Trazodone 50 q.d.  Digoxin 0.125 mg daily     Cardizem 30 mg 4 times a day.    Mucinex 600 mg twice a  day.    Duo nebs twice a day.    Melatonin 3 mg daily at bedtime.    Prednisone taper starting at 40 mg for 4 days and 30 mg for 3 days 20 mg for 3 days then 10 mg for 3 days.       LABORATORY DATA:    09/07/2015.  Sodium 140 potassium 4.4 BUN 25 creatinine 0.4.  WBC 23.2 hemoglobin 10.8 platelets 244.   09/01/2015.  Sodium 141 potassium 3.9 BUN 15 creatinine 0.36.  09/02/2015.  WBC 12.6 hemoglobin 9.7 platelets 208.  BNPn 08/28/2015 was 131.    REVIEW OF SYSTEMS:    GENERAL:   Not complaining of any fever or chills does appear weak.  Skin does not complain of rashes or itching as lower extremity wounds followed by wound care--they have not appear infected inclusion the wound on the she also has a small skin tear on her left mid arm there is some bleeding here but this does not appear to be a source of infection. iHEENT: Does not complain of visual changes or sore throat   CHEST/RESPIRATORY:   Says her breathing is baseline does not complain of cough or increased shortness of breath  beyond baseline     CARDIAC:  No clear chest pain or palpitations.     GI:  No abdominal pain.   No nausea or vomiting does have diarrhea as noted above.   GU:  No dysuria however recently was treated for UTI.   Muscle skeletal has significant lower extremity weakness but does not complain of pain currently.  Neurologic does not complain of dizziness headache or numbness currently  CIRCULATION:  Is with moderate lower extremity edema.   PSYCHIATRIC: Has had significant confusion in the past but this appears to have stabilized.     PHYSICAL EXAMINATION:   VITAL SIGNS:      Temperature 97.7 pulse 98 respirations 20 blood pressure 128/71 GENERAL APPEARANCE:   Somewhat frail elderly appearing female in no distress she is pleasant and appropriate.  Her skin is warm and dry in her right leg shin area she does have a wound that appears to have good granulation no sign of infection --this is followed by wound care she also has a skin tear on her left arm but this does not appear to be source of infection there is a small amount of bleeding .   Oro pharynx-he'll has a slight white film on her Nystatin Has Been Ordered  CHEST/RESPIRATORY:  Upper airway sounds.  Mildly prolonged expiratory phase, no overt congestion or labored breathing  she does have shallow air entry  CARDIOVASCULAR:   CARDIAC:  Heart sounds are normal.  JVP is elevated. she has what appears to be her baseline lower extremity edema right greater than left and this has been chronic for her        GASTROINTESTINAL:   ABDOMEN:  Soft and non tender with positive bowel sounds.      Marland Kitchen    CIRCULATION:   EDEMA/VARICOSITIES: Has had with say one plus edema lower extremities bilaterally greater on the right versus left also some edema of her left hand which apparently is chronic this is cold to touch non-erythematous radial pulses intact     NEUROLOGICAL:    SENSATION/STRENGTH:  Chronic left-sided paresis in the arm greater than leg.     PSYCHIATRIC:   MENTAL STATUS: She is alert and appropriate today   \Labs  As noted above   09/06/2015.  Sodium  140 potassium 2.9 BUN 25 creatinine 0.48.  WBC 20.0 hemoglobin 10.3 platelets 255.  09/02/2015.  WBC 12.6 hemoglobin 9.7 platelets 208.      ASSESSMENT/PLAN:     Atrial fibrillation-at this point appears to be rate controlled she is on digoxin-as well as Cardizem 30 mg 4 times a day she did not tolerate Lopressor in the hospital secondary to bronchospasms-not a good aggressive anticoagulation candidate secondary to falls she is on aspirin 81 mg today  Pulses today appeared to be in the 90s this will have to be watched                History of diastolic CHF-she does continue on the digoxin not a good candidate for beta blocker as noted above currently not on a diuretic this will have to be monitored however at this point appears relatively stable will have to monitor her weights carefully.Exam today his baseline with yesterday's exam in regard to edema                                        COPD.  She is on routine nebulizers --at this point appears relatively stable --but a fragile individual in this regards she is also on a prednisone taper  \Leukocytosis-t This continues to trend up now at 23,200-she still afebrile does not really have any specific complaints of fever chills does not appear to be septic-however diarrhea this morning is concerning for possible C. difficile which can significantly elevated white count we will empirically start Flagyl 500 mg 3 times a day for 10 days and await the C. difficile culture.  Also will add for store.  Since apparently they diarrhea was fairly extensive will give her a liter of fluids this will have to be cautiously done with her history of CHF will do this at only 60 mL an hour and only 1 L and obtain an updated metabolic panel tomorrow.  Also will obtain an updated CBC with differential.  At this point monitor her  vital signs every shift with pulse ox.   -        Hypokalemia-potassium of 2.9 on lab yesterday  This was supplemented aggressively with 40 meq of potassium 2-it has now normalized at 4.4-at this point monitor will update BMP tomorrow                                                                          .  History CVA again she does continue with left-sided weakness as noted above she is on Plavix as well as aspirin and a statin  Again patient's leukocytosis somewhat of a challenging situation this could be at least partially prednisone-related-she's also had diarrhea which would make one concerned about C. difficile so we will again start her on Flagyl empirically.  I urine culture also is pending-she does not appear septic but will have to be watched closely she is a fragile individual with numerous comorbidities which complicates the situation. I do note she had been on prednisone significant dose previously but white count appeared to be normal at that time per chart review  [id number]-of note  greater than 35 minutes spent assessing patient discussing her status with nursing staff-and coordinating formulating a plan of care--of note greater than 50% of time spent coordinating plan of care for this somewhat challenging diagnosis of leukocytosis which appears to be increasing.

## 2015-09-08 ENCOUNTER — Encounter (HOSPITAL_COMMUNITY)
Admission: AD | Admit: 2015-09-08 | Discharge: 2015-09-08 | Disposition: A | Discharge status: Home / Self Care | Payer: Medicare Other | Source: Skilled Nursing Facility | Attending: Internal Medicine | Admitting: Internal Medicine

## 2015-09-08 LAB — CBC WITH DIFFERENTIAL/PLATELET
BASOS ABS: 0 10*3/uL (ref 0.0–0.1)
Basophils Relative: 0 %
EOS ABS: 0.1 10*3/uL (ref 0.0–0.7)
Eosinophils Relative: 1 %
HCT: 32.8 % — ABNORMAL LOW (ref 36.0–46.0)
HEMOGLOBIN: 10.6 g/dL — AB (ref 12.0–15.0)
LYMPHS ABS: 0.9 10*3/uL (ref 0.7–4.0)
LYMPHS PCT: 5 %
MCH: 27.5 pg (ref 26.0–34.0)
MCHC: 32.3 g/dL (ref 30.0–36.0)
MCV: 85 fL (ref 78.0–100.0)
Monocytes Absolute: 0.6 10*3/uL (ref 0.1–1.0)
Monocytes Relative: 4 %
NEUTROS PCT: 90 %
Neutro Abs: 15.8 10*3/uL — ABNORMAL HIGH (ref 1.7–7.7)
Platelets: 197 10*3/uL (ref 150–400)
RBC: 3.86 MIL/uL — AB (ref 3.87–5.11)
RDW: 17.2 % — ABNORMAL HIGH (ref 11.5–15.5)
WBC: 17.4 10*3/uL — AB (ref 4.0–10.5)

## 2015-09-08 LAB — BASIC METABOLIC PANEL
Anion gap: 7 (ref 5–15)
BUN: 18 mg/dL (ref 6–20)
CO2: 28 mmol/L (ref 22–32)
Calcium: 7.4 mg/dL — ABNORMAL LOW (ref 8.9–10.3)
Chloride: 106 mmol/L (ref 101–111)
Creatinine, Ser: <0.3 mg/dL — ABNORMAL LOW (ref 0.44–1.00)
GLUCOSE: 121 mg/dL — AB (ref 65–99)
POTASSIUM: 4 mmol/L (ref 3.5–5.1)
Sodium: 141 mmol/L (ref 135–145)

## 2015-09-08 LAB — C DIFFICILE QUICK SCREEN W PCR REFLEX
C DIFFICILE (CDIFF) INTERP: NEGATIVE
C DIFFICLE (CDIFF) ANTIGEN: NEGATIVE
C Diff toxin: NEGATIVE

## 2015-09-09 ENCOUNTER — Encounter (HOSPITAL_COMMUNITY)
Admission: AD | Admit: 2015-09-09 | Discharge: 2015-09-09 | Disposition: A | Discharge status: Home / Self Care | Payer: Medicare Other | Source: Skilled Nursing Facility | Attending: Internal Medicine | Admitting: Internal Medicine

## 2015-09-09 LAB — CBC WITH DIFFERENTIAL/PLATELET
BASOS ABS: 0 10*3/uL (ref 0.0–0.1)
BASOS PCT: 0 %
EOS ABS: 0.1 10*3/uL (ref 0.0–0.7)
EOS PCT: 1 %
HCT: 32.1 % — ABNORMAL LOW (ref 36.0–46.0)
Hemoglobin: 10.1 g/dL — ABNORMAL LOW (ref 12.0–15.0)
LYMPHS PCT: 6 %
Lymphs Abs: 1.2 10*3/uL (ref 0.7–4.0)
MCH: 26.8 pg (ref 26.0–34.0)
MCHC: 31.5 g/dL (ref 30.0–36.0)
MCV: 85.1 fL (ref 78.0–100.0)
MONO ABS: 0.7 10*3/uL (ref 0.1–1.0)
Monocytes Relative: 4 %
Neutro Abs: 16.9 10*3/uL — ABNORMAL HIGH (ref 1.7–7.7)
Neutrophils Relative %: 89 %
PLATELETS: 180 10*3/uL (ref 150–400)
RBC: 3.77 MIL/uL — AB (ref 3.87–5.11)
RDW: 17.1 % — AB (ref 11.5–15.5)
WBC: 19 10*3/uL — AB (ref 4.0–10.5)

## 2015-09-10 ENCOUNTER — Non-Acute Institutional Stay (SKILLED_NURSING_FACILITY): Payer: Medicare Other | Admitting: Internal Medicine

## 2015-09-10 ENCOUNTER — Encounter: Payer: Self-pay | Admitting: Internal Medicine

## 2015-09-10 DIAGNOSIS — J441 Chronic obstructive pulmonary disease with (acute) exacerbation: Secondary | ICD-10-CM

## 2015-09-10 DIAGNOSIS — I5032 Chronic diastolic (congestive) heart failure: Secondary | ICD-10-CM | POA: Diagnosis not present

## 2015-09-10 DIAGNOSIS — B370 Candidal stomatitis: Secondary | ICD-10-CM | POA: Diagnosis not present

## 2015-09-10 DIAGNOSIS — J42 Unspecified chronic bronchitis: Secondary | ICD-10-CM | POA: Diagnosis not present

## 2015-09-10 DIAGNOSIS — D72829 Elevated white blood cell count, unspecified: Secondary | ICD-10-CM | POA: Diagnosis not present

## 2015-09-10 LAB — URINE CULTURE

## 2015-09-10 NOTE — Progress Notes (Signed)
Patient ID: Tara Park, female   DOB: [birth year], 70 y.o.   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 She was apparently admitted to hospital with A. Fib and rapid ventricular response. Most of what I can get out of the hospital stays from her cardiology consultant. The A. Fib was apparently refractory to various medications and there was concern about beta blockers vis--vis pulmonary wheezing.she was also felt to be in diastolic heart failure. An echocardiogram was done which showed good left ventricular function,grade 1 diastolic dysfunction. Right ventricular function was normal. She was given a prednisone taper for the wheezing which seems to have helped.  Since she has been back here there is been concern for her elevated white count. She was reported to be having diarrhea. A stool for C. Difficile toxin however was negative. She had a urine culture that shows Providencia which is resistant to quinolones.her white count has fluctuated but was 19,000 yesterday with 89% neutrophils  The patient really doesn't have any specific complaints although it is difficult to keep her on subject.  CBC Latest Ref Rng 09/09/2015 09/08/2015 09/07/2015  WBC 4.0 - 10.5 K/uL 19.0(H) 17.4(H) 23.2(H)  Hemoglobin 12.0 - 15.0 g/dL 10.1(L) 10.6(L) 10.8(L)  Hematocrit 36.0 - 46.0 % 32.1(L) 32.8(L) 33.0(L)  Platelets 150 - 400 K/uL 180 197 244   BMP Latest Ref Rng 09/08/2015 09/07/2015 09/06/2015  Glucose 65 - 99 mg/dL 951(O) 841(Y) 606(T)  BUN 6 - 20 mg/dL 18 01(S) 01(U)  Creatinine 0.44 - 1.00 mg/dL <9.32(T) 5.57(D) 2.20  Sodium 135 - 145 mmol/L 141 140 140  Potassium 3.5 - 5.1 mmol/L 4.0 4.4 2.9(L)  Chloride 101 - 111 mmol/L 106 104 104  CO2 22 - 32 mmol/L 28 27 27   Calcium 8.9 -  10.3 mg/dL 7.4(L) 7.9(L) 7.8(L)    Past Medical History  Diagnosis Date  . Diabetes mellitus without complication (HCC)   . Hypertension   . Tobacco abuse   . Use of cane as ambulatory aid   . Leg edema, right     chronic  . Stroke (HCC)   . CHF (congestive heart failure) (HCC)   . COPD (chronic obstructive pulmonary disease) (HCC)     on O2  . Anxiety     Past Surgical History  Procedure Laterality Date  . Cataract extraction    . Hip surgery    . Foot surgery      Current Outpatient Prescriptions on File Prior to Visit  Medication Sig Dispense Refill  . albuterol (PROVENTIL) (2.5 MG/3ML) 0.083% nebulizer solution Take 2.5 mg by nebulization 3 (three) times daily. *may be given every 2 hours as needed for breathing/shortness of breath    . Amino Acids-Protein Hydrolys (FEEDING SUPPLEMENT, PRO-STAT 64,) LIQD Take 30 mLs by mouth 2 (two) times daily.    Marland Kitchen aspirin EC 81 MG tablet Take 81 mg by mouth daily.    . clopidogrel (PLAVIX) 75 MG tablet Take 1 tablet (75 mg total) by mouth daily with breakfast. 30 tablet 3  . clotrimazole (LOTRIMIN) 1 % cream Apply 1 application topically 4 (four) times daily as needed (to corners of mouth/lips as needed).    Marland Kitchen digoxin (LANOXIN) 0.125 MG tablet Take 1 tablet (0.125 mg total) by mouth daily. 30 tablet 3  .  diltiazem (CARDIZEM) 30 MG tablet Take 1 tablet (30 mg total) by mouth 4 (four) times daily. 120 tablet 3  . guaiFENesin (MUCINEX) 600 MG 12 hr tablet Take 1 tablet (600 mg total) by mouth 2 (two) times daily. (Patient not taking: Reported on 08/28/2015) 14 tablet 0  . HYDROcodone-acetaminophen (NORCO) 5-325 MG tablet Take one tablet by mouth every 6 hours as needed for pain. Max APAP 3gm/24hrs from all sources 120 tablet 0  . ipratropium-albuterol (DUONEB) 0.5-2.5 (3) MG/3ML SOLN Take 3 mLs by nebulization 2 (two) times daily. 360 mL 3  . Melatonin 3 MG TABS Take 3 mg by mouth at bedtime.    . metFORMIN (GLUCOPHAGE) 500 MG tablet Take 500  mg by mouth 2 (two) times daily with a meal.    . pravastatin (PRAVACHOL) 40 MG tablet Take 2 tablets (80 mg total) by mouth every evening. 30 tablet 0  . predniSONE (DELTASONE) 10 MG tablet Take 40 mg for 4 days, then Take 30 mg for 3 days, then Take 20 mg for 3 days, Take 10 mg for 3 days, then stop.40 (Patient taking differently: 50 mg. ) 30 tablet 0  . senna-docusate (SENOKOT-S) 8.6-50 MG tablet Take 1 tablet by mouth at bedtime as needed for mild constipation. (Patient not taking: Reported on 08/28/2015) 20 tablet 0  . traZODone (DESYREL) 50 MG tablet Take 1 tablet (50 mg total) by mouth at bedtime as needed for sleep.     Social history; the patient has no restrictions on her care she remains a full code.  Review of systems; a complete 10 point review of systems was done the major points were; Gen. Patient is in no distress Respiratory she is not complaining of cough or shortness of breath Cardiac no chest pain GI; no diarrhea today verified with the nurses GU no clear dysuria Extremities lower extremity edema noted  Physical examination Gen. Patient looks much the same Vitals; O2 sat 96% on 2 L. Pulse rate 96 respirations 20 and unlabored. HEENT she has oral thrush Respiratory; shallow air entry but no crackles or wheezes work of breathing is normal no accessory muscle use Cardiac heart sounds are regular no murmurs or gallops JVP is not elevated. Abdomen; no liver no spleen no tenderness no masses GU no suprapubic or costovertebral angle tenderness Extremities; she has 2-3+ pitting edema below the knees no coccyx edema. She still has that chronic open wound on the lateral aspect of her right calcaneus although it is not draining currently and is nontender. Skin; no pressure areas other otherwise. The area on her right lateral heel is probably related to chronic osteomyelitis she states it's been there for years Neurologic no overt change with left-sided hemiparesis. Mental status;  not much different from usual she has pressure of speech. Sometimes she makes perfect sense other times it seems to be more difficult to follow her. I previously thought that doses of prednisone to do this much good.  Impression/plan #1 refractory atrial fibrillation; her heart rate is in the 90s she seems to be tolerating this well. We may need to increase her diltiazem. It appears that she was taken off her Lopressor. #2 diastolic congestive heart failure. Echo showed grade 1 diastolic dysfunction does not appear to be fluid overloaded currently although she does have a lot of edema in her lower legs. She is not on a diuretic currently.I am expecting to have to start some at some point #3 severe COPD on oxygen. It does not appear  that this is that active. She did not come back on inhalers or beta agonists presumably because of the refractory nature of A. Fib. #4 she has oral thrush #5 no diarrhea today, C. Difficile is not the source of her elevated white count #6; Providencia UTI. This is resistant to quinolones.this is sensitive to Rocephin. She apparently received quinolones in the hospital #7 late effect right hemisphere CVA this does not appear to be unstable #8 leukocytosis probably related to steroids. UTI possible.

## 2015-09-13 ENCOUNTER — Encounter: Payer: Self-pay | Admitting: Internal Medicine

## 2015-09-13 ENCOUNTER — Non-Acute Institutional Stay (SKILLED_NURSING_FACILITY): Payer: Medicare Other | Admitting: Internal Medicine

## 2015-09-13 DIAGNOSIS — R609 Edema, unspecified: Secondary | ICD-10-CM

## 2015-09-13 DIAGNOSIS — I5022 Chronic systolic (congestive) heart failure: Secondary | ICD-10-CM

## 2015-09-13 DIAGNOSIS — R635 Abnormal weight gain: Secondary | ICD-10-CM

## 2015-09-13 NOTE — Progress Notes (Signed)
Patient ID: KARRI KALLENBACH, female   DOB: [birth year], 70 y.o.   MRN: [id number]                   PROGRESS NOTE  DATE:  09/13/2015        FACILITY: [facility]              LEVEL OF CARE:   SNF   Acute Visit CHIEF COMPLAINT: Acute visit secondary edema--especially left arm edema  .    HISTORY OF PRESENT ILLNESS:  Mrs. Rotenberry is a lady with oxygen-dependent COPD.  She came here after a series of admissions to [facility] in November 2016 with COPD acute.    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    She has a history of diastolic heart failure a   Patient was recently sent back to the hospital apparently presenting to nursing with increased leg edema however it appears she was admitted actually more for atrial fibrillation with rapid ventricular response.  Apparently her A. fib converted within 10 hours, sinus rhythm.  There was an addition of Lopressor but apparently she had bronchospasms which complicated matters--her Lopressor was discontinued and her bronchospasm resolved over 3 or 4 days.        diltiazem was increased it appears from 3 times a day to 4 times a day I suspect to her atrial fibrillation which again appears to be controlled  Patient does have a history of somewhat chronic edema but this appears to be gradually increasing her weight appears to have gone up some with a weight of 152.2 today on February 17 was 147.3.  She does have a history of chronic diastolic heart failure stage I.  We also have been following for elevated white count which appears to fluctuate it had been a size 23,200-lab done on February 17 was 19,000-result this could be related to her steroids-at one point thought she would had diarrhea but C. difficile has come back negative.  Urine culture did grow out Providencia -----she is on a course of Cefdiner  She is been afebrile she does not complaining any fever or  chills  .         Previous medical history   Diagnosis Date  . Diabetes mellitus without complication (HCC)   . Hypertension   . Tobacco abuse   . Use of cane as ambulatory aid   . Leg edema, right     chronic  . Stroke (HCC)   . CHF (congestive heart failure) (HCC)   . COPD (chronic obstructive pulmonary disease) (HCC)     on O2  . Anxiety      PAST SURGICAL HISTORY:  Past Surgical History  Procedure Laterality Date  . Cataract extraction    . Hip surgery    . Foot surgery        CURRENT MEDICATIONS:  Medication list is reviewed.           Aspirin 81 q.d.      Plavix 75 q.d.     Lasix 20 q.d.    Hydrocodone 5/325 q.6 hours p.r.n.     Metformin 500 b.i.d.    .    Albuterol nebulizers 2.5 three times a day and p.r.n.     Pravastatin 40 q.d.      Trazodone 50 q.d.  Digoxin 0.125 mg daily     Cardizem 30 mg 4 times a day.    Mucinex 600 mg  twice a day.    Duo nebs twice a day.    Melatonin 3 mg daily at bedtime.    Prednisone taper starting at 40 mg for 4 days and 30 mg for 3 days 20 mg for 3 days then 10 mg for 3 days.  Cefdinir 300 mg bid       LABORATORY DATA:    09/09/2015.  WBC 19.0 hemoglobin 10.1 platelets 180.  09/08/2015.  Sodium 141 potassium 4 BUN 18 creatinine less than 0.3.   WBC 17.4 hemoglobin10.6 atelets 197  09/07/2015.  Sodium 140 potassium 4.4 BUN 25 creatinine 0.4.  WBC 23.2 hemoglobin 10.8 platelets 244.   09/01/2015.  Sodium 141 potassium 3.9 BUN 15 creatinine 0.36.  09/02/2015.  WBC 12.6 hemoglobin 9.7 platelets 208.  BNPn 08/28/2015 was 131.    REVIEW OF SYSTEMS:    GENERAL:   Not complaining of any fever or chills   Skin does not complain of rashes or itching as lower extremity wounds followed by wound care--t all amount of bruising noted on her left arm with edema . iHEENT: Does not complain of visual changes or sore throat   CHEST/RESPIRATORY:   Says her breathing is baseline  does not complain of cough or increased shortness of breath beyond baseline     CARDIAC:  No clear chest pain or palpitations.     GI:  No abdominal pain.   No nausea or vomiting apparently diarrhea has resolved GU:  Is not overtly complain of dysuria   Muscle skeletal has significant lower extremity weakness but does not complain of pain currently.  Neurologic does not complain of dizziness headache or numbness currently  CIRCULATION: Appears to have some mildly increased lower extremity edema as well as edema increased left arm PSYCHIATRIC: Has had significant confusion in the past but this appears to have stabilized.     PHYSICAL EXAMINATION:   VITAL SIGNS:      Temperature 98.6 pulse 60 respirations 20 blood pressures variable 156/81-112/76 in this range weight 152.2 GENERAL APPEARANCE:   Somewhat frail elderly appearing female in no distress she is pleasant and appropriate.   Skin is warm and dry lower extremity wounds followed by wound care currently covered-she does have a small bruising on her left arm that appears to be chronic however edema on left arm does appear to be increased this is cool to touch.   Oro pharynx-mucous membranes are moist CHEST/RESPIRATORY:  Upper airway sounds.  Mildly prolonged expiratory phase, no overt congestion or labored breathing  she does have shallow air entry  CARDIOVASCULAR:   Some increase left arm edema radial pulses intact edema is cool to touch not overtly erythematous there is some bruising which appears to be chronic -Heart:  Regular irregular. --lowerextremity edema appears to be somewhat increased from baseline--    GASTROINTESTINAL:   ABDOMEN:  Soft and non tender with positive bowel sounds.          NEUROLOGICAL:    SENSATION/STRENGTH:  Chronic left-sided paresis in the arm greater than leg.   PSYCHIATRIC:   MENTAL STATUS: She is alert and appropriate today   \Labs  As noted above   09/06/2015.  Sodium 140 potassium 2.9 BUN  25 creatinine 0.48.  WBC 20.0 hemoglobin 10.3 platelets 255.  09/02/2015.  WBC 12.6 hemoglobin 9.7 platelets 208.      ASSESSMENT/PLAN:     Atrial fibrillation-at this point appears to be rate controlled she is on digoxin-as well as Cardizem 30 mg 4 times  a day she did not tolerate Lopressor in the hospital secondary to bronchospasms-not a good aggressive anticoagulation candidate secondary to falls she is on aspirin 81 mg today  Pulses here to vary from the 60s to 90s per exam and listed pulses             History of diastolic CHF-she does continue on the digoxin not a good candidate for beta blocker as noted above currently not on a diuretic  However she appears to have some increased edema will start Lasix 20 mg a day with potassium 10 mEq daily-this was discussed with Dr. Leanord Hawking via phone-will need an updated metabolic panel on Thursday, February 23                                        COPD.  She is on routine nebulizers --at this point appears relatively stable --but a fragile individual in this regards she is also on a prednisone taper  \Leukocytosis-t  As noted above this could be largely due to steroids again will update a CBC later this week-also is on cefdinir with history of UTI   History of left arm edema this appears to be increased from baseline Will order venous Doppler rule out DVT      .  History CVA again she does continue with left-sided weakness as noted above she is on Plavix as well as aspirin and a statin     [id number]

## 2015-09-14 ENCOUNTER — Ambulatory Visit (HOSPITAL_COMMUNITY)
Admission: RE | Admit: 2015-09-14 | Discharge: 2015-09-14 | Disposition: A | Discharge status: Home / Self Care | Payer: No Typology Code available for payment source | Source: Ambulatory Visit | Attending: Internal Medicine | Admitting: Internal Medicine

## 2015-09-14 ENCOUNTER — Other Ambulatory Visit: Payer: Self-pay | Admitting: Internal Medicine

## 2015-09-14 ENCOUNTER — Non-Acute Institutional Stay (SKILLED_NURSING_FACILITY): Payer: Medicare Other | Admitting: Internal Medicine

## 2015-09-14 DIAGNOSIS — R609 Edema, unspecified: Secondary | ICD-10-CM

## 2015-09-14 DIAGNOSIS — I5022 Chronic systolic (congestive) heart failure: Secondary | ICD-10-CM | POA: Diagnosis not present

## 2015-09-14 NOTE — Progress Notes (Signed)
Patient ID: Tara Park, female   DOB: [birth year], 70 y.o.   MRN: [id number] Patient ID: Tara Park, female   DOB: Mar 24, 1946, 70 y.o.   MRN: [id number]                   PROGRESS NOTE  DATE:  09/14/2015        FACILITY: [facility]              LEVEL OF CARE:   SNF   Acute Visit CHIEF COMPLAINT: Acute visit follow-up left arm edema-lower extremity edema  .    HISTORY OF PRESENT ILLNESS:  [name] is a lady with oxygen-dependent COPD.  She came here after a series of admissions to [facility] in November 2016 with COPD acute.    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    She has a history of diastolic heart failure a   Patient was recently sent back to the hospital apparently presenting to nursing with increased leg edema however it appears she was admitted actually more for atrial fibrillation with rapid ventricular response.  Apparently her A. fib converted within 10 hours, sinus rhythm.  There was an addition of Lopressor but apparently she had bronchospasms which complicated matters--her Lopressor was discontinued and her bronchospasm resolved over 3 or 4 days.        diltiazem was increased it appears from 3 times a day to 4 times a day I suspect to her atrial fibrillation which again appears to be controlled  Patient does have a history of somewhat chronic edema but this appears to be gradually increasing her weight appears to have gone up some with a weight of 152.2 today on February 17 was 147.3.  She does have a history of chronic diastolic heart failure stage I.  I saw her yesterday for some increased edema especially in her left arm a venous Doppler was ordered which came back negative for any DVT.  In regards to the increased edema lower extremity she was started on low-dose Lasix 20 mg a day edema appears to be somewhat better today.    We also have been following for elevated white count which  appears to fluctuate it had been a size 23,200-lab done on February 17 was 19,000-result this could be related to her steroids-at one point thought she would had diarrhea but C. difficile has come back negative.  Urine culture did grow out Providencia -----she is on a course of Cefdiner  She is been afebrile she does not complaining any fever or chills--will need to update her CBC here for follow-up but she does not certainly give a septic presentation  .         Previous medical history   Diagnosis Date  . Diabetes mellitus without complication (HCC)   . Hypertension   . Tobacco abuse   . Use of cane as ambulatory aid   . Leg edema, right     chronic  . Stroke (HCC)   . CHF (congestive heart failure) (HCC)   . COPD (chronic obstructive pulmonary disease) (HCC)     on O2  . Anxiety      PAST SURGICAL HISTORY:  Past Surgical History  Procedure Laterality Date  . Cataract extraction    . Hip surgery    . Foot surgery        CURRENT MEDICATIONS:  Medication list is reviewed.  Aspirin 81 q.d.      Plavix 75 q.d.     Lasix 20 q.d.    Hydrocodone 5/325 q.6 hours p.r.n.     Metformin 500 b.i.d.    .    Albuterol nebulizers 2.5 three times a day and p.r.n.     Pravastatin 40 q.d.      Trazodone 50 q.d.  Digoxin 0.125 mg daily     Cardizem 30 mg 4 times a day.    Mucinex 600 mg twice a day.    Duo nebs twice a day.    Melatonin 3 mg daily at bedtime.    Prednisone taper starting at 40 mg for 4 days and 30 mg for 3 days 20 mg for 3 days then 10 mg for 3 days.  Cefdinir 300 mg bid       LABORATORY DATA:    09/09/2015.  WBC 19.0 hemoglobin 10.1 platelets 180.  09/08/2015.  Sodium 141 potassium 4 BUN 18 creatinine less than 0.3.   WBC 17.4 hemoglobin10.6 atelets 197  09/07/2015.  Sodium 140 potassium 4.4 BUN 25 creatinine 0.4.  WBC 23.2 hemoglobin 10.8 platelets 244.   09/01/2015.  Sodium 141 potassium 3.9 BUN 15  creatinine 0.36.  09/02/2015.  WBC 12.6 hemoglobin 9.7 platelets 208.  BNPn 08/28/2015 was 131.    REVIEW OF SYSTEMS:    GENERAL:   Not complaining of any fever or chills   Skin does not complain of rashes or itching as lower extremity wounds followed by wound care--t all amount of bruising noted on her left arm with edema . HEENT: Does not complain of visual changes or sore throat   CHEST/RESPIRATORY:   Says her breathing is baseline does not complain of cough or increased shortness of breath beyond baseline     CARDIAC:  No clear chest pain or palpitations.     GI:  No abdominal pain.   No nausea or vomiting apparently diarrhea has resolved GU:  Is not overtly complain of dysuria   Muscle skeletal has significant lower extremity weakness but does not complain of pain currently.  Neurologic does not complain of dizziness headache or numbness currently  CIRCULATION: Edema appears to be slightly decreased in her arm and legs today PSYCHIATRIC: Has had significant confusion in the past but this appears to have stabilized.     PHYSICAL EXAMINATION:   VITAL SIGNS:       T-98.0--- pulse 90 respirations 20 blood pressure 140/74 weight was 152.2 yesterday--update weight is pending GENERAL APPEARANCE:   Somewhat frail elderly appearing female in no distress she is pleasant and appropriate.   Skin is warm and dry lower extremity wounds followed by wound care currently covered-she does have a small bruising on her left arm that appears to be chronic--edema left arm appears to be decreased from yesterday   Oro pharynx-mucous membranes are moist CHEST/RESPIRATORY:  Upper airway sounds.  Mildly prolonged expiratory phase, no overt congestion or labored breathing  she does have shallow air entry  CARDIOVASCULAR:   Decreased  left arm edema radial pulses intact edema is cool to touch not overtly erythematous there is some bruising which appears to be chronic -Heart:  Regular irregular.  --lowerextremity edema appears to be somewhat decreased from yesterday    GASTROINTESTINAL:   ABDOMEN:  Soft and non tender with positive bowel sounds.          NEUROLOGICAL:    SENSATION/STRENGTH:  Chronic left-sided paresis in the arm greater than leg.  PSYCHIATRIC:   MENTAL STATUS: She is alert and appropriate today   \Labs  As noted above   09/06/2015.  Sodium 140 potassium 2.9 BUN 25 creatinine 0.48.  WBC 20.0 hemoglobin 10.3 platelets 255.  09/02/2015.  WBC 12.6 hemoglobin 9.7 platelets 208.      ASSESSMENT/PLAN:                 History of diastolic CHF-she does continue on the digoxin not a good candidate for beta blocker as noted above currently not on a diuretic    She has had some increased edema and weight gain we have started low-dose Lasix 20 mg a day as of yesterday gave appears to be somewhat improved today Will update a metabolic panel ordered for tomorrow                \Leukocytosis-t  As noted above this could be largely due to steroids again will update a CBC later this week-also is on cefdinir with history of UTI   History of left arm edema this appears to be improving-venous Doppler was negative for DVT-I did call her family. Discussed the results with them including her son and daughter-in-law         CPT-35--of note greater than 25 minutes spent assessing patient-discussing her status with family via phone-and coordinating plan of care-of note greater than 50% of time spent coordinating plan of care with family input

## 2015-09-15 ENCOUNTER — Encounter (HOSPITAL_COMMUNITY)
Admission: AD | Admit: 2015-09-15 | Discharge: 2015-09-15 | Disposition: A | Discharge status: Home / Self Care | Payer: Medicare Other | Source: Skilled Nursing Facility | Attending: Internal Medicine | Admitting: Internal Medicine

## 2015-09-15 LAB — CBC WITH DIFFERENTIAL/PLATELET
BASOS PCT: 0 %
Basophils Absolute: 0 10*3/uL (ref 0.0–0.1)
Eosinophils Absolute: 0.1 10*3/uL (ref 0.0–0.7)
Eosinophils Relative: 1 %
HEMATOCRIT: 34.6 % — AB (ref 36.0–46.0)
HEMOGLOBIN: 10.9 g/dL — AB (ref 12.0–15.0)
LYMPHS ABS: 1.2 10*3/uL (ref 0.7–4.0)
Lymphocytes Relative: 9 %
MCH: 26.5 pg (ref 26.0–34.0)
MCHC: 31.5 g/dL (ref 30.0–36.0)
MCV: 84.2 fL (ref 78.0–100.0)
MONO ABS: 0.6 10*3/uL (ref 0.1–1.0)
MONOS PCT: 4 %
NEUTROS ABS: 11.1 10*3/uL — AB (ref 1.7–7.7)
NEUTROS PCT: 86 %
Platelets: 208 10*3/uL (ref 150–400)
RBC: 4.11 MIL/uL (ref 3.87–5.11)
RDW: 16.7 % — AB (ref 11.5–15.5)
WBC: 12.9 10*3/uL — ABNORMAL HIGH (ref 4.0–10.5)

## 2015-09-15 LAB — BASIC METABOLIC PANEL
ANION GAP: 10 (ref 5–15)
BUN: 16 mg/dL (ref 6–20)
CALCIUM: 7.9 mg/dL — AB (ref 8.9–10.3)
CHLORIDE: 96 mmol/L — AB (ref 101–111)
CO2: 33 mmol/L — AB (ref 22–32)
CREATININE: 0.34 mg/dL — AB (ref 0.44–1.00)
GFR calc non Af Amer: >60 mL/min (ref >60)
GLUCOSE: 199 mg/dL — AB (ref 65–99)
Potassium: 3.6 mmol/L (ref 3.5–5.1)
Sodium: 139 mmol/L (ref 135–145)

## 2015-09-22 ENCOUNTER — Encounter (HOSPITAL_COMMUNITY)
Admission: RE | Admit: 2015-09-22 | Discharge: 2015-09-22 | Disposition: A | Discharge status: Home / Self Care | Payer: Medicare Other | Source: Skilled Nursing Facility | Attending: Internal Medicine | Admitting: Internal Medicine

## 2015-09-22 DIAGNOSIS — E785 Hyperlipidemia, unspecified: Secondary | ICD-10-CM | POA: Diagnosis not present

## 2015-09-22 DIAGNOSIS — R278 Other lack of coordination: Secondary | ICD-10-CM | POA: Diagnosis not present

## 2015-09-22 DIAGNOSIS — I10 Essential (primary) hypertension: Secondary | ICD-10-CM | POA: Diagnosis present

## 2015-09-22 DIAGNOSIS — I5022 Chronic systolic (congestive) heart failure: Secondary | ICD-10-CM | POA: Insufficient documentation

## 2015-09-22 DIAGNOSIS — R41841 Cognitive communication deficit: Secondary | ICD-10-CM | POA: Diagnosis not present

## 2015-09-22 DIAGNOSIS — M6281 Muscle weakness (generalized): Secondary | ICD-10-CM | POA: Insufficient documentation

## 2015-09-22 LAB — BASIC METABOLIC PANEL
ANION GAP: 9 (ref 5–15)
BUN: 12 mg/dL (ref 6–20)
CALCIUM: 7.9 mg/dL — AB (ref 8.9–10.3)
CO2: 29 mmol/L (ref 22–32)
Chloride: 97 mmol/L — ABNORMAL LOW (ref 101–111)
Creatinine, Ser: 0.39 mg/dL — ABNORMAL LOW (ref 0.44–1.00)
Glucose, Bld: 240 mg/dL — ABNORMAL HIGH (ref 65–99)
Potassium: 3.8 mmol/L (ref 3.5–5.1)
Sodium: 135 mmol/L (ref 135–145)

## 2015-10-09 ENCOUNTER — Non-Acute Institutional Stay (SKILLED_NURSING_FACILITY): Payer: Medicare Other | Admitting: Internal Medicine

## 2015-10-09 DIAGNOSIS — E11621 Type 2 diabetes mellitus with foot ulcer: Secondary | ICD-10-CM | POA: Diagnosis not present

## 2015-10-09 DIAGNOSIS — J42 Unspecified chronic bronchitis: Secondary | ICD-10-CM

## 2015-10-09 DIAGNOSIS — I5032 Chronic diastolic (congestive) heart failure: Secondary | ICD-10-CM | POA: Diagnosis not present

## 2015-10-09 DIAGNOSIS — L97519 Non-pressure chronic ulcer of other part of right foot with unspecified severity: Secondary | ICD-10-CM | POA: Diagnosis not present

## 2015-10-09 NOTE — Progress Notes (Signed)
Patient ID: Tara Park, female   DOB: [birth year], 70 y.o.   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 She was a seen here for one day and then return to the hospital from 11/19 through 11/22 with COPD acute. She was rehospitalized from 2/5 through 2/13 with refractory atrial fibrillation and again COPD acute. In January she was aggressive and psychotic. I stumbled on the fact that the taper orders for her prednisone in November were never carried out and she basically had been on high-dose prednisone for 6-7 weeks. He gradually tapered this eventually to off and her psychosis gradually improved. She also has diastolic heart failure and is mentioned refractory atrial fibrillation.  Staff have noted increasing edema of her bilateral lower legs and also her dependent/troche affected left hand. I don't see any major weight changes in the chart on 3/3 she was 139.2 pounds and on 3/17 at 141.8 pounds. This is actually less than in February when she was 150 pounds. Most of the edema appears to be centered in her bilateral lower legs and her left arm.  Past Medical History  Diagnosis Date  . Diabetes mellitus without complication (HCC)   . Hypertension   . Tobacco abuse   . Use of cane as ambulatory aid   . Leg edema, right     chronic  . Stroke (HCC)   . CHF (congestive heart failure) (HCC)   . COPD (chronic obstructive pulmonary disease) (HCC)     on O2  . Anxiety     Current medications Albuterol nebulizers 2.5 every 2 when necessary ASA 81 daily Plavix 75 daily Digoxin 0.125 by mouth daily Diltiazem 30 mg 4 times a day Guaifenesin 12 hour twice a day Duonebs twice a day Lasix 20 a day Melatonin 3 mg at bedtime Metformin 500 twice a day KCl 10 mEq  daily Pravastatin 80 daily Zoloft 50 daily Trazodone 50 mg daily at bedtime when necessary  BMP Latest Ref Rng 09/22/2015 09/15/2015 09/08/2015  Glucose 65 - 99 mg/dL 409(W) 119(J) 478(G)  BUN 6 - 20 mg/dL Creatinine 0.44 - 1.00 mg/dL 9.56(O) 1.30(Q) <6.57(Q)  Sodium 135 - 145 mmol/L 135 139 141  Potassium 3.5 - 5.1 mmol/L 3.8 3.6 4.0  Chloride 101 - 111 mmol/L 97(L) 96(L) 106  CO2 22 - 32 mmol/L 29 33(H) 28  Calcium 8.9 - 10.3 mg/dL 7.9(L) 7.9(L) 7.4(L)    CBC Latest Ref Rng 09/15/2015 09/09/2015 09/08/2015  WBC 4.0 - 10.5 K/uL 12.9(H) 19.0(H) 17.4(H)  Hemoglobin 12.0 - 15.0 g/dL 10.9(L) 10.1(L) 10.6(L)  Hematocrit 36.0 - 46.0 % 34.6(L) 32.1(L) 32.8(L)  Platelets 150 - 400 K/uL 208 180 197    Review of systems Gen. a she states she feels well. Respiratory slightly wheezy Cardiac no chest pain or palpitations Abdomen no abdominal pain nausea vomiting or diarrhea GU no dysuria Extremities she has not noted any increased lower extremity edema but does note the edema and her hand  Physical examination Gen. patient is sitting in bed feeding herself. Vitals blood pressure 114/59 respirations 20 pulse 109 temperature 98.1 Respiratory; poor air entry bilaterally there is prolonged expiratory phase and expiratory wheezing Cardiac heart sounds are irregular no murmurs JVP is not elevated. Abdomen no liver spleen or abdominal tenderness GU no suprapubic  tenderness no CVA tenderness Extremities she has bilateral edema in her lower extremities which I think is mostly venous stasis her right leg is wrapped probably a venous insufficiency also her. 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  Impression/plan #1 severe COPD. Her current O2 sats are 99% on 3 L she is been on chronic oxygen. She still has a prolonged expiratory phase and expiratory wheezing. She is on Duonebs twice a day. I think this can probably be increased as well as providing her  with a steroid inhaler. #2 edema; he does have diastolic congestive heart failure. Her last echocardiogram on 2/6 showed a normal EF of 65-70%, normal right ventricular function mild LVH. There is no clinical evidence of CHF at the bedside and her weights have not increased. I think her edema mostly as other explanations including her dependent left arm and her bilateral venous stasis in the lower extremities. Probably needs a lymphedema sleeve for the left arm or an Ace wrap #3 type 2 diabetes on metformin. Last hemoglobin A1c was 6.1 in early February.

## 2015-10-19 ENCOUNTER — Encounter (HOSPITAL_COMMUNITY): Payer: Self-pay

## 2015-10-19 ENCOUNTER — Inpatient Hospital Stay (HOSPITAL_COMMUNITY)
Admission: EM | Admit: 2015-10-19 | Discharge: 2015-10-27 | DRG: 190 | Disposition: A | Discharge status: Skilled Nursing Facility | Payer: Medicare Other | Attending: Family Medicine | Admitting: Family Medicine

## 2015-10-19 ENCOUNTER — Emergency Department (HOSPITAL_COMMUNITY): Payer: Medicare Other

## 2015-10-19 ENCOUNTER — Other Ambulatory Visit: Payer: Self-pay

## 2015-10-19 ENCOUNTER — Non-Acute Institutional Stay (SKILLED_NURSING_FACILITY): Payer: Medicare Other | Admitting: Internal Medicine

## 2015-10-19 DIAGNOSIS — R400 Somnolence: Secondary | ICD-10-CM

## 2015-10-19 DIAGNOSIS — I5022 Chronic systolic (congestive) heart failure: Secondary | ICD-10-CM

## 2015-10-19 DIAGNOSIS — J441 Chronic obstructive pulmonary disease with (acute) exacerbation: Secondary | ICD-10-CM

## 2015-10-19 DIAGNOSIS — J42 Unspecified chronic bronchitis: Secondary | ICD-10-CM | POA: Diagnosis not present

## 2015-10-19 DIAGNOSIS — F17210 Nicotine dependence, cigarettes, uncomplicated: Secondary | ICD-10-CM | POA: Diagnosis present

## 2015-10-19 DIAGNOSIS — I10 Essential (primary) hypertension: Secondary | ICD-10-CM

## 2015-10-19 DIAGNOSIS — Z825 Family history of asthma and other chronic lower respiratory diseases: Secondary | ICD-10-CM

## 2015-10-19 DIAGNOSIS — R609 Edema, unspecified: Secondary | ICD-10-CM | POA: Diagnosis not present

## 2015-10-19 DIAGNOSIS — F419 Anxiety disorder, unspecified: Secondary | ICD-10-CM | POA: Diagnosis present

## 2015-10-19 DIAGNOSIS — B370 Candidal stomatitis: Secondary | ICD-10-CM

## 2015-10-19 DIAGNOSIS — I4891 Unspecified atrial fibrillation: Secondary | ICD-10-CM | POA: Diagnosis not present

## 2015-10-19 DIAGNOSIS — Z8673 Personal history of transient ischemic attack (TIA), and cerebral infarction without residual deficits: Secondary | ICD-10-CM

## 2015-10-19 DIAGNOSIS — R0602 Shortness of breath: Secondary | ICD-10-CM | POA: Diagnosis not present

## 2015-10-19 DIAGNOSIS — Z7982 Long term (current) use of aspirin: Secondary | ICD-10-CM

## 2015-10-19 DIAGNOSIS — M76891 Other specified enthesopathies of right lower limb, excluding foot: Secondary | ICD-10-CM

## 2015-10-19 DIAGNOSIS — J440 Chronic obstructive pulmonary disease with acute lower respiratory infection: Secondary | ICD-10-CM | POA: Diagnosis not present

## 2015-10-19 DIAGNOSIS — J411 Mucopurulent chronic bronchitis: Secondary | ICD-10-CM

## 2015-10-19 DIAGNOSIS — L8990 Pressure ulcer of unspecified site, unspecified stage: Secondary | ICD-10-CM | POA: Diagnosis present

## 2015-10-19 DIAGNOSIS — E876 Hypokalemia: Secondary | ICD-10-CM | POA: Diagnosis present

## 2015-10-19 DIAGNOSIS — I509 Heart failure, unspecified: Secondary | ICD-10-CM

## 2015-10-19 DIAGNOSIS — E785 Hyperlipidemia, unspecified: Secondary | ICD-10-CM | POA: Diagnosis present

## 2015-10-19 DIAGNOSIS — J449 Chronic obstructive pulmonary disease, unspecified: Secondary | ICD-10-CM | POA: Diagnosis present

## 2015-10-19 DIAGNOSIS — Y95 Nosocomial condition: Secondary | ICD-10-CM | POA: Diagnosis present

## 2015-10-19 DIAGNOSIS — I5042 Chronic combined systolic (congestive) and diastolic (congestive) heart failure: Secondary | ICD-10-CM | POA: Diagnosis present

## 2015-10-19 DIAGNOSIS — J189 Pneumonia, unspecified organism: Secondary | ICD-10-CM | POA: Diagnosis present

## 2015-10-19 DIAGNOSIS — E119 Type 2 diabetes mellitus without complications: Secondary | ICD-10-CM | POA: Diagnosis present

## 2015-10-19 DIAGNOSIS — B379 Candidiasis, unspecified: Secondary | ICD-10-CM | POA: Diagnosis present

## 2015-10-19 DIAGNOSIS — I480 Paroxysmal atrial fibrillation: Secondary | ICD-10-CM | POA: Diagnosis present

## 2015-10-19 DIAGNOSIS — M76892 Other specified enthesopathies of left lower limb, excluding foot: Secondary | ICD-10-CM

## 2015-10-19 DIAGNOSIS — I482 Chronic atrial fibrillation: Secondary | ICD-10-CM | POA: Diagnosis present

## 2015-10-19 DIAGNOSIS — I110 Hypertensive heart disease with heart failure: Secondary | ICD-10-CM | POA: Diagnosis present

## 2015-10-19 DIAGNOSIS — J9621 Acute and chronic respiratory failure with hypoxia: Secondary | ICD-10-CM | POA: Diagnosis present

## 2015-10-19 DIAGNOSIS — Z7902 Long term (current) use of antithrombotics/antiplatelets: Secondary | ICD-10-CM

## 2015-10-19 DIAGNOSIS — Z7984 Long term (current) use of oral hypoglycemic drugs: Secondary | ICD-10-CM

## 2015-10-19 DIAGNOSIS — Z8249 Family history of ischemic heart disease and other diseases of the circulatory system: Secondary | ICD-10-CM

## 2015-10-19 DIAGNOSIS — I248 Other forms of acute ischemic heart disease: Secondary | ICD-10-CM

## 2015-10-19 HISTORY — DX: Dysphagia, unspecified: R13.10

## 2015-10-19 HISTORY — DX: Hyperlipidemia, unspecified: E78.5

## 2015-10-19 HISTORY — DX: Respiratory failure, unspecified, unspecified whether with hypoxia or hypercapnia: J96.90

## 2015-10-19 LAB — URINALYSIS, ROUTINE W REFLEX MICROSCOPIC
Bilirubin Urine: NEGATIVE
Glucose, UA: NEGATIVE mg/dL
KETONES UR: NEGATIVE mg/dL
NITRITE: POSITIVE — AB
PH: 7 (ref 5.0–8.0)
PROTEIN: NEGATIVE mg/dL
Specific Gravity, Urine: 1.005 (ref 1.005–1.030)

## 2015-10-19 LAB — CBC WITH DIFFERENTIAL/PLATELET
BASOS PCT: 0 %
Basophils Absolute: 0.1 10*3/uL (ref 0.0–0.1)
Eosinophils Absolute: 0 10*3/uL (ref 0.0–0.7)
Eosinophils Relative: 0 %
HEMATOCRIT: 39 % (ref 36.0–46.0)
HEMOGLOBIN: 12.6 g/dL (ref 12.0–15.0)
LYMPHS ABS: 1.8 10*3/uL (ref 0.7–4.0)
LYMPHS PCT: 6 %
MCH: 24.9 pg — AB (ref 26.0–34.0)
MCHC: 32.3 g/dL (ref 30.0–36.0)
MCV: 76.9 fL — AB (ref 78.0–100.0)
MONO ABS: 1.5 10*3/uL — AB (ref 0.1–1.0)
MONOS PCT: 5 %
NEUTROS ABS: 29.3 10*3/uL — AB (ref 1.7–7.7)
NEUTROS PCT: 89 %
Platelets: 333 10*3/uL (ref 150–400)
RBC: 5.07 MIL/uL (ref 3.87–5.11)
RDW: 16.3 % — ABNORMAL HIGH (ref 11.5–15.5)
WBC: 32.7 10*3/uL — ABNORMAL HIGH (ref 4.0–10.5)

## 2015-10-19 LAB — BASIC METABOLIC PANEL
Anion gap: 10 (ref 5–15)
BUN: 10 mg/dL (ref 6–20)
CHLORIDE: 92 mmol/L — AB (ref 101–111)
CO2: 32 mmol/L (ref 22–32)
Calcium: 7.6 mg/dL — ABNORMAL LOW (ref 8.9–10.3)
Creatinine, Ser: 0.47 mg/dL (ref 0.44–1.00)
GFR calc Af Amer: >60 mL/min (ref >60)
GFR calc non Af Amer: >60 mL/min (ref >60)
Glucose, Bld: 112 mg/dL — ABNORMAL HIGH (ref 65–99)
POTASSIUM: 3.4 mmol/L — AB (ref 3.5–5.1)
SODIUM: 134 mmol/L — AB (ref 135–145)

## 2015-10-19 LAB — URINE MICROSCOPIC-ADD ON

## 2015-10-19 LAB — BRAIN NATRIURETIC PEPTIDE: B NATRIURETIC PEPTIDE 5: 102 pg/mL — AB (ref 0.0–100.0)

## 2015-10-19 LAB — TROPONIN I: Troponin I: 0.03 ng/mL (ref <0.031)

## 2015-10-19 MED ORDER — METHYLPREDNISOLONE SODIUM SUCC 125 MG IJ SOLR
125.0000 mg | Freq: Once | INTRAMUSCULAR | Status: AC
  Administered 2015-10-19: 125 mg via INTRAVENOUS
  Filled 2015-10-19: qty 2

## 2015-10-19 MED ORDER — HYDROCODONE-ACETAMINOPHEN 5-325 MG PO TABS: ORAL_TABLET | ORAL | Status: AC

## 2015-10-19 MED ORDER — IPRATROPIUM-ALBUTEROL 0.5-2.5 (3) MG/3ML IN SOLN
3.0000 mL | Freq: Once | RESPIRATORY_TRACT | Status: AC
  Administered 2015-10-19: 3 mL via RESPIRATORY_TRACT
  Filled 2015-10-19: qty 3

## 2015-10-19 MED ORDER — VANCOMYCIN HCL IN DEXTROSE 1-5 GM/200ML-% IV SOLN
1000.0000 mg | Freq: Once | INTRAVENOUS | Status: AC
  Administered 2015-10-19: 1000 mg via INTRAVENOUS
  Filled 2015-10-19: qty 200

## 2015-10-19 MED ORDER — PIPERACILLIN-TAZOBACTAM 3.375 G IVPB
3.3750 g | Freq: Once | INTRAVENOUS | Status: AC
  Administered 2015-10-19: 3.375 g via INTRAVENOUS
  Filled 2015-10-19: qty 50

## 2015-10-19 MED ORDER — FUROSEMIDE 10 MG/ML IJ SOLN
40.0000 mg | Freq: Once | INTRAMUSCULAR | Status: AC
  Administered 2015-10-19: 40 mg via INTRAVENOUS
  Filled 2015-10-19: qty 4

## 2015-10-19 NOTE — ED Notes (Signed)
Pt resident of [facility] and staff reports they noticed increased respiratory effort, cough, weight gain, and edema over the past few days.  Pt has history of chf and copd.  Denies fever.  Denies pain

## 2015-10-19 NOTE — Telephone Encounter (Signed)
RX faxed to [facility] @ [phone]. Phone number [phone]  

## 2015-10-19 NOTE — ED Provider Notes (Signed)
CSN: [id number]     Arrival date & time 10/19/15  1742 History   First MD Initiated Contact with Patient 10/19/15 1752     Chief Complaint  Patient presents with  . Shortness of Breath     (Consider location/radiation/quality/duration/timing/severity/associated sxs/prior Treatment) HPI Comments: Patient is a 70 year old female with past medical history of diabetes, hypertension, COPD, CHF. She was sent from the [facility] for evaluation of shortness of breath. She tells me this has been worsening over the past several days. She reports congestion in her chest and occasional productive cough. She also reports increased swelling in her legs. She denies fevers or chills.  Patient is a 70 y.o. female presenting with shortness of breath. The history is provided by the patient.  Shortness of Breath Severity:  Moderate Onset quality:  Gradual Duration:  3 days Timing:  Constant Progression:  Worsening Chronicity:  New Relieved by:  Nothing Worsened by:  Nothing tried Ineffective treatments:  None tried Associated symptoms: cough   Associated symptoms: no chest pain and no fever     Past Medical History  Diagnosis Date  . Diabetes mellitus without complication (HCC)   . Hypertension   . Tobacco abuse   . Use of cane as ambulatory aid   . Leg edema, right     chronic  . Stroke (HCC)   . CHF (congestive heart failure) (HCC)   . COPD (chronic obstructive pulmonary disease) (HCC)     on O2  . Anxiety   . Respiratory failure (HCC)   . Hyperlipidemia   . Dysphagia    Past Surgical History  Procedure Laterality Date  . Cataract extraction    . Hip surgery    . Foot surgery     Family History  Problem Relation Age of Onset  . Transient ischemic attack Mother   . COPD Father   . Heart disease Brother    Social History  Substance Use Topics  . Smoking status: Current Every Day Smoker    Types: Cigarettes  . Smokeless tobacco: None  . Alcohol Use: No   OB History    No  data available     Review of Systems  Constitutional: Negative for fever.  Respiratory: Positive for cough and shortness of breath.   Cardiovascular: Negative for chest pain.  All other systems reviewed and are negative.     Allergies  Review of patient's allergies indicates no known allergies.  Home Medications   Prior to Admission medications   Medication Sig Start Date End Date Taking? Authorizing Provider  albuterol (PROVENTIL) (2.5 MG/3ML) 0.083% nebulizer solution Take 2.5 mg by nebulization 3 (three) times daily. *may be given every 2 hours as needed for breathing/shortness of breath    Historical Provider, MD  Amino Acids-Protein Hydrolys (FEEDING SUPPLEMENT, PRO-STAT 64,) LIQD Take 30 mLs by mouth 2 (two) times daily.    Historical Provider, MD  aspirin EC 81 MG tablet Take 81 mg by mouth daily.    Historical Provider, MD  clopidogrel (PLAVIX) 75 MG tablet Take 1 tablet (75 mg total) by mouth daily with breakfast. 06/10/15   Oval Linsey, MD  clotrimazole (LOTRIMIN) 1 % cream Apply 1 application topically 4 (four) times daily as needed (to corners of mouth/lips as needed).    Historical Provider, MD  digoxin (LANOXIN) 0.125 MG tablet Take 1 tablet (0.125 mg total) by mouth daily. 09/05/15   Oval Linsey, MD  diltiazem (CARDIZEM) 30 MG tablet Take 1 tablet (30 mg total)  by mouth 4 (four) times daily. 09/05/15   Oval Linseyichard Dondiego, MD  furosemide (LASIX) 20 MG tablet Take 20 mg by mouth.    Historical Provider, MD  guaiFENesin (MUCINEX) 600 MG 12 hr tablet Take 1 tablet (600 mg total) by mouth 2 (two) times daily. Patient not taking: Reported on 08/28/2015 06/14/15   Rolly SalterPranav M Patel, MD  HYDROcodone-acetaminophen [facility](NORCO) 5-325 MG tablet Take one tablet by mouth every 6 hours as needed for pain. Max APAP 3gm/24hrs from all sources 10/19/15   Kimber RelicArthur G Green, MD  ipratropium-albuterol (DUONEB) 0.5-2.5 (3) MG/3ML SOLN Take 3 mLs by nebulization 2 (two) times daily. 09/05/15   Oval Linseyichard  Dondiego, MD  Melatonin 3 MG TABS Take 3 mg by mouth at bedtime.    Historical Provider, MD  metFORMIN (GLUCOPHAGE) 500 MG tablet Take 500 mg by mouth 2 (two) times daily with a meal.    Historical Provider, MD  potassium chloride (K-DUR,KLOR-CON) 10 MEQ tablet Take 10 mEq by mouth 2 (two) times daily.    Historical Provider, MD  pravastatin (PRAVACHOL) 40 MG tablet Take 2 tablets (80 mg total) by mouth every evening. 06/14/15   Rolly SalterPranav M Patel, MD  predniSONE (DELTASONE) 10 MG tablet Take 40 mg for 4 days, then Take 30 mg for 3 days, then Take 20 mg for 3 days, Take 10 mg for 3 days, then stop.40 Patient taking differently: 50 mg.  06/14/15   Rolly SalterPranav M Patel, MD  senna-docusate (SENOKOT-S) 8.6-50 MG tablet Take 1 tablet by mouth at bedtime as needed for mild constipation. Patient not taking: Reported on 08/28/2015 06/14/15   Rolly SalterPranav M Patel, MD  traZODone (DESYREL) 50 MG tablet Take 1 tablet (50 mg total) by mouth at bedtime as needed for sleep. 06/14/15   Rolly SalterPranav M Patel, MD   BP 140/91 mmHg  Temp(Src) 97.6 F (36.4 C) (Oral)  SpO2 93% Physical Exam  Constitutional: She is oriented to person, place, and time. She appears well-developed and well-nourished. No distress.  HENT:  Head: Normocephalic and atraumatic.  Mouth/Throat: Oropharynx is clear and moist.  Neck: Normal range of motion. Neck supple.  Cardiovascular: Normal rate and regular rhythm.  Exam reveals no gallop and no friction rub.   No murmur heard. Pulmonary/Chest: Effort normal. No respiratory distress. She has no wheezes. She has rales.  There are rales in the bases bilaterally.  Abdominal: Soft. Bowel sounds are normal. She exhibits no distension. There is no tenderness.  Musculoskeletal: Normal range of motion. She exhibits edema.  There is 3+ pitting edema of the lower extremities.  Neurological: She is alert and oriented to person, place, and time.  Skin: Skin is warm and dry. She is not diaphoretic.  Nursing note and  vitals reviewed.   ED Course  Procedures (including critical care time) Labs Review Labs Reviewed  CBC WITH DIFFERENTIAL/PLATELET  BASIC METABOLIC PANEL  BRAIN NATRIURETIC PEPTIDE  TROPONIN I    Imaging Review Dg Chest Portable 1 View  10/19/2015  CLINICAL DATA:  Cough and shortness of breath EXAM: PORTABLE CHEST 1 VIEW COMPARISON:  08/30/2015 chest radiograph. FINDINGS: Stable cardiomediastinal silhouette with normal heart size. No pneumothorax. No pleural effusion. Emphysema. Stable reticular opacities at both lung apices, in keeping with mild pleural-parenchymal scarring. No pulmonary edema. New mild patchy opacity at the right lung base. IMPRESSION: Emphysema. New mild patchy opacity at the right lung base, favor atelectasis, cannot entirely exclude a mild/ developing pneumonia. Electronically Signed   By: Jannifer RodneyJason A Poff M.D.  On: 10/19/2015 18:10   I have personally reviewed and evaluated these images and lab results as part of my medical decision-making.   EKG Interpretation   Date/Time:  Wednesday October 19 2015 17:49:10 EDT Ventricular Rate:  124 PR Interval:  187 QRS Duration: 77 QT Interval:  336 QTC Calculation: 483 R Axis:   89 Text Interpretation:  Sinus tachycardia Ventricular tachycardia,  unsustained Borderline right axis deviation Nonspecific repol abnormality,  inferior leads Confirmed by Nevaeh Korte  MD, Darrah Dredge ([id number]) on 10/19/2015 8:14:22  PM      MDM   Final diagnoses:  None    Patient is a 70 year old female history of CHF and COPD who presents from the [facility] for worsening breathing over the past several days. From the results of my workup, it appears as though this dyspnea is multifactorial. I suspect a component of CHF as well as COPD and likely even pneumonia. She does have a high white count. For this reason, blood cultures have been obtained. She will also have urine sample obtained. She will be given broad-spectrum antibiotics and admitted to the  hospitalist service. She also received IV Lasix, Solu-Medrol, and albuterol by nebulizer.  CRITICAL CARE Performed by: Geoffery Lyons Total critical care time: 30 minutes Critical care time was exclusive of separately billable procedures and treating other patients. Critical care was necessary to treat or prevent imminent or life-threatening deterioration. Critical care was time spent personally by me on the following activities: development of treatment plan with patient and/or surrogate as well as nursing, discussions with consultants, evaluation of patient's response to treatment, examination of patient, obtaining history from patient or surrogate, ordering and performing treatments and interventions, ordering and review of laboratory studies, ordering and review of radiographic studies, pulse oximetry and re-evaluation of patient's condition.     Geoffery Lyons, MD 10/19/15 2016

## 2015-10-19 NOTE — ED Notes (Addendum)
Lab at bedside. RT aware of breathing treatment order.

## 2015-10-19 NOTE — H&P (Signed)
PCP:   Isabella Stalling, MD   Chief Complaint:  shortness of breath  HPI:  70 year old female who  has a past medical history of Diabetes mellitus without complication (HCC); Hypertension; Tobacco abuse; Use of cane as ambulatory aid; Leg edema, right; Stroke [facility]); CHF (congestive heart failure) (HCC); COPD (chronic obstructive pulmonary disease) (HCC); Anxiety; Respiratory failure (HCC); Hyperlipidemia; and Dysphagia. Today presents to the hospital from skilled facility with shortness of breath, patient has a history of COPD, diastolic heart failure, atrial fibrillation. Chest x-ray showed pneumonia. White count elevated to 32,000. Patient complains of coughing up white to yellow colored phlegm. She denies fever or chills. No nausea vomiting or diarrhea. Abdominal pain. No dysuria urgency frequency of urination. Patient had a UTI diagnosed last month which was treated. Patient was on anticoagulation which was stopped last month by patient's PCP due to high risk of falls. UA is pending in the ED.  Allergies:  No Known Allergies    Past Medical History  Diagnosis Date  . Diabetes mellitus without complication (HCC)   . Hypertension   . Tobacco abuse   . Use of cane as ambulatory aid   . Leg edema, right     chronic  . Stroke (HCC)   . CHF (congestive heart failure) (HCC)   . COPD (chronic obstructive pulmonary disease) (HCC)     on O2  . Anxiety   . Respiratory failure (HCC)   . Hyperlipidemia   . Dysphagia     Past Surgical History  Procedure Laterality Date  . Cataract extraction    . Hip surgery    . Foot surgery      Prior to Admission medications   Medication Sig Start Date End Date Taking? Authorizing Provider  albuterol (PROVENTIL) (2.5 MG/3ML) 0.083% nebulizer solution Take 2.5 mg by nebulization 3 (three) times daily. *may be given every 2 hours as needed for breathing/shortness of breath    Historical Provider, MD  Amino Acids-Protein Hydrolys (FEEDING  SUPPLEMENT, PRO-STAT 64,) LIQD Take 30 mLs by mouth 2 (two) times daily.    Historical Provider, MD  aspirin EC 81 MG tablet Take 81 mg by mouth daily.    Historical Provider, MD  clopidogrel (PLAVIX) 75 MG tablet Take 1 tablet (75 mg total) by mouth daily with breakfast. 06/10/15   Oval Linsey, MD  clotrimazole (LOTRIMIN) 1 % cream Apply 1 application topically 4 (four) times daily as needed (to corners of mouth/lips as needed).    Historical Provider, MD  digoxin (LANOXIN) 0.125 MG tablet Take 1 tablet (0.125 mg total) by mouth daily. 09/05/15   Oval Linsey, MD  diltiazem (CARDIZEM) 30 MG tablet Take 1 tablet (30 mg total) by mouth 4 (four) times daily. 09/05/15   Oval Linsey, MD  furosemide (LASIX) 20 MG tablet Take 20 mg by mouth.    Historical Provider, MD  guaiFENesin (MUCINEX) 600 MG 12 hr tablet Take 1 tablet (600 mg total) by mouth 2 (two) times daily. Patient not taking: Reported on 08/28/2015 06/14/15   Rolly Salter, MD  HYDROcodone-acetaminophen [facility]) 5-325 MG tablet Take one tablet by mouth every 6 hours as needed for pain. Max APAP 3gm/24hrs from all sources 10/19/15   Kimber Relic, MD  ipratropium-albuterol (DUONEB) 0.5-2.5 (3) MG/3ML SOLN Take 3 mLs by nebulization 2 (two) times daily. 09/05/15   Oval Linsey, MD  Melatonin 3 MG TABS Take 3 mg by mouth at bedtime.    Historical Provider, MD  metFORMIN (GLUCOPHAGE) 500  MG tablet Take 500 mg by mouth 2 (two) times daily with a meal.    Historical Provider, MD  potassium chloride (K-DUR,KLOR-CON) 10 MEQ tablet Take 10 mEq by mouth 2 (two) times daily.    Historical Provider, MD  pravastatin (PRAVACHOL) 40 MG tablet Take 2 tablets (80 mg total) by mouth every evening. 06/14/15   Rolly SalterPranav M Patel, MD  predniSONE (DELTASONE) 10 MG tablet Take 40 mg for 4 days, then Take 30 mg for 3 days, then Take 20 mg for 3 days, Take 10 mg for 3 days, then stop.40 Patient taking differently: 50 mg.  06/14/15   Rolly SalterPranav M Patel, MD    senna-docusate (SENOKOT-S) 8.6-50 MG tablet Take 1 tablet by mouth at bedtime as needed for mild constipation. Patient not taking: Reported on 08/28/2015 06/14/15   Rolly SalterPranav M Patel, MD  traZODone (DESYREL) 50 MG tablet Take 1 tablet (50 mg total) by mouth at bedtime as needed for sleep. 06/14/15   Rolly SalterPranav M Patel, MD    Social History:  reports that she has been smoking Cigarettes.  She does not have any smokeless tobacco history on file. She reports that she does not drink alcohol or use illicit drugs.  Family History  Problem Relation Age of Onset  . Transient ischemic attack Mother   . COPD Father   . Heart disease Brother      All the positives are listed in BOLD  Review of Systems:  HEENT: Headache, blurred vision, runny nose, sore throat Neck: Hypothyroidism, hyperthyroidism,,lymphadenopathy Chest : Shortness of breath, history of COPD, Asthma Heart : Chest pain, history of coronary arterey disease GI:  Nausea, vomiting, diarrhea, constipation, GERD GU: Dysuria, urgency, frequency of urination, hematuria Neuro: Stroke, seizures, syncope Psych: Depression, anxiety, hallucinations   Physical Exam: Blood pressure 136/72, pulse 118, temperature 97.6 F (36.4 C), temperature source Oral, resp. rate 25, SpO2 95 %. Constitutional:   Patient is a well-developed and well-nourished female in no acute distress and cooperative with exam. Head: Normocephalic and atraumatic Mouth: Mucus membranes moist Eyes: PERRL, EOMI, conjunctivae normal Neck: Supple, No Thyromegaly Cardiovascular: RRR, S1 normal, S2 normal Pulmonary/Chest: Bilateral rhonchi Abdominal: Soft. Non-tender, non-distended, bowel sounds are normal, no masses, organomegaly, or guarding present.  Neurological: A&O x3, Strength is normal and symmetric bilaterally, cranial nerve II-XII are grossly intact, no focal motor deficit, sensory intact to light touch bilaterally.  Extremities : No Cyanosis, Clubbing. Bilateral 2+  pitting edema  Labs on Admission:  Basic Metabolic Panel:  Recent Labs Lab 10/19/15 1829  NA 134*  K 3.4*  CL 92*  CO2 32  GLUCOSE 112*  BUN 10  CREATININE 0.47  CALCIUM 7.6*   CBC:  Recent Labs Lab 10/19/15 1829  WBC 32.7*  NEUTROABS 29.3*  HGB 12.6  HCT 39.0  MCV 76.9*  PLT 333   Cardiac Enzymes:  Recent Labs Lab 10/19/15 1829  TROPONINI 0.03    BNP (last 3 results)  Recent Labs  06/11/15 0953 08/28/15 1740 10/19/15 1829  BNP 193.0* 131.0* 102.0*     Radiological Exams on Admission: Dg Chest Portable 1 View  10/19/2015  CLINICAL DATA:  Cough and shortness of breath EXAM: PORTABLE CHEST 1 VIEW COMPARISON:  08/30/2015 chest radiograph. FINDINGS: Stable cardiomediastinal silhouette with normal heart size. No pneumothorax. No pleural effusion. Emphysema. Stable reticular opacities at both lung apices, in keeping with mild pleural-parenchymal scarring. No pulmonary edema. New mild patchy opacity at the right lung base. IMPRESSION: Emphysema. New mild patchy opacity at  the right lung base, favor atelectasis, cannot entirely exclude a mild/ developing pneumonia. Electronically Signed   By: Delbert Phenix M.D.   On: 10/19/2015 18:10    EKG: Independently reviewed. Atrial fibrillation    Assessment/Plan Active Problems:   Chronic systolic CHF (congestive heart failure) (HCC)   COPD (chronic obstructive pulmonary disease) (HCC)   A-fib (HCC)   HCAP (healthcare-associated pneumonia)   Acute respiratory failure- multifactorial from HCAP, COPD, CHF. Will admit the patient on telemetry, start IV Lasix 20 mg every 12 hours from tomorrow morning.  Start vancomycin and cefepime. Solu-Medrol 60 mg IV every 6 hours.  Atrial fibrillation- will monitor the patient under telemetry. Chads VASC score of 7. Patient was on anticoagulation which was stopped last month by PCP due to high risk of falls.  Diabetes mellitus- hold metformin, start sliding scale insulin with  NovoLog.  Bilateral lower extremity edema- patient has a history of chronic diastolic heart failure,  will start Lasix 20 mg IV every 12 hours from tomorrow morning. Patient received 1 dose of Lasix 40 mg IV 1 in the ED.  DVT prophylaxis- Lovenox  Code status: Full code  Family discussion: No family at bedside   Time Spent on Admission: 60 min  Hiroyuki Ozanich S [facility] Pager: [phone] 10/19/2015, 8:25 PM  If 7PM-7AM, please contact night-coverage  [email or web address]  Password [email or web address]

## 2015-10-19 NOTE — ED Notes (Signed)
Tara Park [facility]) Cell : [phone] Home: [phone]

## 2015-10-20 ENCOUNTER — Encounter (HOSPITAL_COMMUNITY): Payer: Self-pay | Admitting: *Deleted

## 2015-10-20 ENCOUNTER — Encounter: Payer: Self-pay | Admitting: Internal Medicine

## 2015-10-20 DIAGNOSIS — L8990 Pressure ulcer of unspecified site, unspecified stage: Secondary | ICD-10-CM | POA: Insufficient documentation

## 2015-10-20 DIAGNOSIS — I110 Hypertensive heart disease with heart failure: Secondary | ICD-10-CM | POA: Diagnosis present

## 2015-10-20 DIAGNOSIS — B379 Candidiasis, unspecified: Secondary | ICD-10-CM | POA: Diagnosis present

## 2015-10-20 DIAGNOSIS — Z8249 Family history of ischemic heart disease and other diseases of the circulatory system: Secondary | ICD-10-CM | POA: Diagnosis not present

## 2015-10-20 DIAGNOSIS — J440 Chronic obstructive pulmonary disease with acute lower respiratory infection: Secondary | ICD-10-CM | POA: Diagnosis present

## 2015-10-20 DIAGNOSIS — F419 Anxiety disorder, unspecified: Secondary | ICD-10-CM | POA: Diagnosis present

## 2015-10-20 DIAGNOSIS — J189 Pneumonia, unspecified organism: Secondary | ICD-10-CM | POA: Diagnosis present

## 2015-10-20 DIAGNOSIS — E119 Type 2 diabetes mellitus without complications: Secondary | ICD-10-CM | POA: Diagnosis present

## 2015-10-20 DIAGNOSIS — E785 Hyperlipidemia, unspecified: Secondary | ICD-10-CM | POA: Diagnosis present

## 2015-10-20 DIAGNOSIS — Z7984 Long term (current) use of oral hypoglycemic drugs: Secondary | ICD-10-CM | POA: Diagnosis not present

## 2015-10-20 DIAGNOSIS — N390 Urinary tract infection, site not specified: Secondary | ICD-10-CM | POA: Diagnosis not present

## 2015-10-20 DIAGNOSIS — R0602 Shortness of breath: Secondary | ICD-10-CM | POA: Diagnosis present

## 2015-10-20 DIAGNOSIS — F17210 Nicotine dependence, cigarettes, uncomplicated: Secondary | ICD-10-CM | POA: Diagnosis present

## 2015-10-20 DIAGNOSIS — J9621 Acute and chronic respiratory failure with hypoxia: Secondary | ICD-10-CM | POA: Diagnosis present

## 2015-10-20 DIAGNOSIS — B370 Candidal stomatitis: Secondary | ICD-10-CM | POA: Diagnosis not present

## 2015-10-20 DIAGNOSIS — Z825 Family history of asthma and other chronic lower respiratory diseases: Secondary | ICD-10-CM | POA: Diagnosis not present

## 2015-10-20 DIAGNOSIS — J441 Chronic obstructive pulmonary disease with (acute) exacerbation: Secondary | ICD-10-CM | POA: Diagnosis present

## 2015-10-20 DIAGNOSIS — E876 Hypokalemia: Secondary | ICD-10-CM | POA: Diagnosis present

## 2015-10-20 DIAGNOSIS — Z8673 Personal history of transient ischemic attack (TIA), and cerebral infarction without residual deficits: Secondary | ICD-10-CM | POA: Diagnosis not present

## 2015-10-20 DIAGNOSIS — I5042 Chronic combined systolic (congestive) and diastolic (congestive) heart failure: Secondary | ICD-10-CM | POA: Diagnosis present

## 2015-10-20 DIAGNOSIS — Z7902 Long term (current) use of antithrombotics/antiplatelets: Secondary | ICD-10-CM | POA: Diagnosis not present

## 2015-10-20 DIAGNOSIS — I482 Chronic atrial fibrillation: Secondary | ICD-10-CM | POA: Diagnosis present

## 2015-10-20 DIAGNOSIS — Z7982 Long term (current) use of aspirin: Secondary | ICD-10-CM | POA: Diagnosis not present

## 2015-10-20 DIAGNOSIS — Y95 Nosocomial condition: Secondary | ICD-10-CM | POA: Diagnosis present

## 2015-10-20 LAB — COMPREHENSIVE METABOLIC PANEL
ALT: 29 U/L (ref 14–54)
AST: 45 U/L — AB (ref 15–41)
Albumin: 1.7 g/dL — ABNORMAL LOW (ref 3.5–5.0)
Alkaline Phosphatase: 309 U/L — ABNORMAL HIGH (ref 38–126)
Anion gap: 9 (ref 5–15)
BILIRUBIN TOTAL: 0.6 mg/dL (ref 0.3–1.2)
BUN: 11 mg/dL (ref 6–20)
CO2: 34 mmol/L — ABNORMAL HIGH (ref 22–32)
CREATININE: 0.46 mg/dL (ref 0.44–1.00)
Calcium: 7.4 mg/dL — ABNORMAL LOW (ref 8.9–10.3)
Chloride: 92 mmol/L — ABNORMAL LOW (ref 101–111)
Glucose, Bld: 138 mg/dL — ABNORMAL HIGH (ref 65–99)
POTASSIUM: 3.2 mmol/L — AB (ref 3.5–5.1)
Sodium: 135 mmol/L (ref 135–145)
TOTAL PROTEIN: 4.7 g/dL — AB (ref 6.5–8.1)

## 2015-10-20 LAB — GLUCOSE, CAPILLARY
GLUCOSE-CAPILLARY: 214 mg/dL — AB (ref 65–99)
GLUCOSE-CAPILLARY: 165 mg/dL — AB (ref 65–99)
Glucose-Capillary: 191 mg/dL — ABNORMAL HIGH (ref 65–99)

## 2015-10-20 LAB — CBC
HEMATOCRIT: 33.3 % — AB (ref 36.0–46.0)
Hemoglobin: 10.6 g/dL — ABNORMAL LOW (ref 12.0–15.0)
MCH: 24.5 pg — AB (ref 26.0–34.0)
MCHC: 31.8 g/dL (ref 30.0–36.0)
MCV: 77.1 fL — AB (ref 78.0–100.0)
PLATELETS: 251 10*3/uL (ref 150–400)
RBC: 4.32 MIL/uL (ref 3.87–5.11)
RDW: 16.3 % — AB (ref 11.5–15.5)
WBC: 16.4 10*3/uL — ABNORMAL HIGH (ref 4.0–10.5)

## 2015-10-20 LAB — MRSA PCR SCREENING: MRSA BY PCR: NEGATIVE

## 2015-10-20 LAB — CBG MONITORING, ED: GLUCOSE-CAPILLARY: 169 mg/dL — AB (ref 65–99)

## 2015-10-20 MED ORDER — FUROSEMIDE 10 MG/ML IJ SOLN
20.0000 mg | Freq: Two times a day (BID) | INTRAMUSCULAR | Status: DC
  Administered 2015-10-20 – 2015-10-24 (×9): 20 mg via INTRAVENOUS
  Filled 2015-10-20 (×9): qty 2

## 2015-10-20 MED ORDER — CEFEPIME HCL 2 G IJ SOLR
2.0000 g | Freq: Once | INTRAMUSCULAR | Status: AC
  Administered 2015-10-20: 2 g via INTRAVENOUS
  Filled 2015-10-20: qty 2

## 2015-10-20 MED ORDER — DILTIAZEM HCL 30 MG PO TABS
30.0000 mg | ORAL_TABLET | Freq: Four times a day (QID) | ORAL | Status: DC
  Administered 2015-10-20 – 2015-10-27 (×29): 30 mg via ORAL
  Filled 2015-10-20 (×29): qty 1

## 2015-10-20 MED ORDER — HYDROCODONE-ACETAMINOPHEN 5-325 MG PO TABS
1.0000 | ORAL_TABLET | ORAL | Status: DC | PRN
  Administered 2015-10-21 – 2015-10-26 (×2): 1 via ORAL
  Filled 2015-10-20 (×2): qty 1

## 2015-10-20 MED ORDER — VANCOMYCIN HCL IN DEXTROSE 1-5 GM/200ML-% IV SOLN
1000.0000 mg | Freq: Once | INTRAVENOUS | Status: AC
  Administered 2015-10-20: 1000 mg via INTRAVENOUS
  Filled 2015-10-20: qty 200

## 2015-10-20 MED ORDER — POTASSIUM CHLORIDE CRYS ER 10 MEQ PO TBCR
10.0000 meq | EXTENDED_RELEASE_TABLET | Freq: Two times a day (BID) | ORAL | Status: DC
  Administered 2015-10-20 – 2015-10-23 (×7): 10 meq via ORAL
  Filled 2015-10-20 (×7): qty 1

## 2015-10-20 MED ORDER — IPRATROPIUM BROMIDE 0.02 % IN SOLN: 0.5000 mg | Freq: Four times a day (QID) | RESPIRATORY_TRACT | Status: DC

## 2015-10-20 MED ORDER — GUAIFENESIN ER 600 MG PO TB12
600.0000 mg | ORAL_TABLET | Freq: Two times a day (BID) | ORAL | Status: DC
  Administered 2015-10-20 – 2015-10-27 (×15): 600 mg via ORAL
  Filled 2015-10-20 (×17): qty 1

## 2015-10-20 MED ORDER — RESOURCE THICKENUP CLEAR PO POWD
ORAL | Status: DC | PRN
  Filled 2015-10-20 (×2): qty 125

## 2015-10-20 MED ORDER — LEVALBUTEROL HCL 0.63 MG/3ML IN NEBU: 0.6300 mg | INHALATION_SOLUTION | Freq: Four times a day (QID) | RESPIRATORY_TRACT | Status: DC

## 2015-10-20 MED ORDER — ENOXAPARIN SODIUM 40 MG/0.4ML ~~LOC~~ SOLN
40.0000 mg | SUBCUTANEOUS | Status: DC
  Administered 2015-10-20: 40 mg via SUBCUTANEOUS
  Filled 2015-10-20: qty 0.4

## 2015-10-20 MED ORDER — INSULIN ASPART 100 UNIT/ML ~~LOC~~ SOLN
0.0000 [IU] | Freq: Three times a day (TID) | SUBCUTANEOUS | Status: DC
  Administered 2015-10-20: 3 [IU] via SUBCUTANEOUS
  Administered 2015-10-20 – 2015-10-21 (×4): 2 [IU] via SUBCUTANEOUS
  Administered 2015-10-22: 3 [IU] via SUBCUTANEOUS
  Administered 2015-10-22: 2 [IU] via SUBCUTANEOUS
  Administered 2015-10-22: 1 [IU] via SUBCUTANEOUS
  Administered 2015-10-23: 3 [IU] via SUBCUTANEOUS
  Administered 2015-10-23: 2 [IU] via SUBCUTANEOUS
  Administered 2015-10-23: 3 [IU] via SUBCUTANEOUS
  Administered 2015-10-24 – 2015-10-25 (×5): 2 [IU] via SUBCUTANEOUS
  Administered 2015-10-26: 3 [IU] via SUBCUTANEOUS
  Administered 2015-10-26: 2 [IU] via SUBCUTANEOUS
  Administered 2015-10-26: 3 [IU] via SUBCUTANEOUS
  Administered 2015-10-27: 1 [IU] via SUBCUTANEOUS
  Filled 2015-10-20: qty 1

## 2015-10-20 MED ORDER — IPRATROPIUM BROMIDE 0.02 % IN SOLN
0.5000 mg | Freq: Four times a day (QID) | RESPIRATORY_TRACT | Status: DC
  Administered 2015-10-20 – 2015-10-21 (×6): 0.5 mg via RESPIRATORY_TRACT
  Filled 2015-10-20 (×6): qty 2.5

## 2015-10-20 MED ORDER — POTASSIUM CHLORIDE 20 MEQ PO PACK
20.0000 meq | PACK | Freq: Every day | ORAL | Status: AC
  Administered 2015-10-20 – 2015-10-22 (×3): 20 meq via ORAL
  Filled 2015-10-20 (×3): qty 1

## 2015-10-20 MED ORDER — TRAZODONE HCL 50 MG PO TABS
50.0000 mg | ORAL_TABLET | Freq: Every evening | ORAL | Status: DC | PRN
  Administered 2015-10-20 – 2015-10-23 (×3): 50 mg via ORAL
  Filled 2015-10-20 (×4): qty 1

## 2015-10-20 MED ORDER — DIGOXIN 125 MCG PO TABS
0.1250 mg | ORAL_TABLET | Freq: Every day | ORAL | Status: DC
  Administered 2015-10-20 – 2015-10-27 (×8): 0.125 mg via ORAL
  Filled 2015-10-20 (×9): qty 1

## 2015-10-20 MED ORDER — ONDANSETRON HCL 4 MG PO TABS: 4.0000 mg | ORAL_TABLET | Freq: Four times a day (QID) | ORAL | Status: DC | PRN

## 2015-10-20 MED ORDER — ONDANSETRON HCL 4 MG/2ML IJ SOLN: 4.0000 mg | Freq: Four times a day (QID) | INTRAMUSCULAR | Status: DC | PRN

## 2015-10-20 MED ORDER — ENOXAPARIN SODIUM 40 MG/0.4ML ~~LOC~~ SOLN
40.0000 mg | SUBCUTANEOUS | Status: DC
  Administered 2015-10-21 – 2015-10-27 (×7): 40 mg via SUBCUTANEOUS
  Filled 2015-10-20 (×6): qty 0.4

## 2015-10-20 MED ORDER — ASPIRIN EC 81 MG PO TBEC
81.0000 mg | DELAYED_RELEASE_TABLET | Freq: Every day | ORAL | Status: DC
  Administered 2015-10-20 – 2015-10-27 (×8): 81 mg via ORAL
  Filled 2015-10-20 (×7): qty 1

## 2015-10-20 MED ORDER — DEXTROSE 5 % IV SOLN
1.0000 g | Freq: Three times a day (TID) | INTRAVENOUS | Status: DC
  Administered 2015-10-20 – 2015-10-27 (×21): 1 g via INTRAVENOUS
  Filled 2015-10-20 (×26): qty 1

## 2015-10-20 MED ORDER — VANCOMYCIN HCL 500 MG IV SOLR
500.0000 mg | Freq: Two times a day (BID) | INTRAVENOUS | Status: DC
  Administered 2015-10-20 – 2015-10-27 (×14): 500 mg via INTRAVENOUS
  Filled 2015-10-20 (×16): qty 500

## 2015-10-20 MED ORDER — LEVALBUTEROL HCL 0.63 MG/3ML IN NEBU
0.6300 mg | INHALATION_SOLUTION | Freq: Three times a day (TID) | RESPIRATORY_TRACT | Status: DC
  Administered 2015-10-20 – 2015-10-27 (×19): 0.63 mg via RESPIRATORY_TRACT
  Filled 2015-10-20 (×19): qty 3

## 2015-10-20 MED ORDER — METHYLPREDNISOLONE SODIUM SUCC 125 MG IJ SOLR
60.0000 mg | Freq: Four times a day (QID) | INTRAMUSCULAR | Status: DC
  Administered 2015-10-20 – 2015-10-24 (×18): 60 mg via INTRAVENOUS
  Filled 2015-10-20 (×18): qty 2

## 2015-10-20 MED ORDER — LEVALBUTEROL HCL 0.63 MG/3ML IN NEBU
0.6300 mg | INHALATION_SOLUTION | Freq: Four times a day (QID) | RESPIRATORY_TRACT | Status: DC
  Administered 2015-10-20 (×2): 0.63 mg via RESPIRATORY_TRACT
  Filled 2015-10-20 (×2): qty 3

## 2015-10-20 MED ORDER — SODIUM CHLORIDE 0.9 % IV SOLN
INTRAVENOUS | Status: DC
  Administered 2015-10-23 – 2015-10-27 (×6): via INTRAVENOUS

## 2015-10-20 NOTE — Progress Notes (Signed)
Pharmacy Antibiotic Note  Tara GuyJane F Park is a 70 y.o. female admitted on 10/19/2015 with pneumonia.  Pharmacy has been consulted for Vancomycin and Cefepime dosing.  Plan: Vancomycin 500mg  IV every 12 hours.  1gm given 3/29 at 1945 and additional 1gm given at 0515 this am Cefepime 1gm IV every 8 hours   Temp (24hrs), Avg:98.1 F (36.7 C), Min:97.6 F (36.4 C), Max:98.7 F (37.1 C)   Recent Labs Lab 10/19/15 1829 10/20/15 0429  WBC 32.7* 16.4*  CREATININE 0.47 0.46    CrCl cannot be calculated (Unknown ideal weight.).    No Known Allergies  Antimicrobials this admission: Vancomycin 3/29 >>  Cefepime 3/29 >>   Microbiology results: 3/29  BCx: pending 3/29 UCx: pending 2/5 MRSA PCR: negative  Thank you for allowing pharmacy to be a part of this patient's care.  Tara CyphersLorie Greg Park, BS Pharm D, BCPS Clinical Pharmacist Pager [phone] 10/20/2015 8:10 AM

## 2015-10-20 NOTE — Progress Notes (Signed)
ANTIBIOTIC CONSULT NOTE-Preliminary  Pharmacy Consult for vancomycin, cefepime Indication: pneumonia  No Known Allergies  Patient Measurements:   Adjusted Body Weight:   Vital Signs: Temp: 97.8 F (36.6 C) (03/30 0029) Temp Source: Oral (03/29 2055) BP: 118/54 mmHg (03/30 0500) Pulse Rate: 82 (03/30 0500)  Labs:  Recent Labs  10/19/15 1829 10/20/15 0429  WBC 32.7* 16.4*  HGB 12.6 10.6*  PLT 333 251  CREATININE 0.47  --     CrCl cannot be calculated (Unknown ideal weight.).  No results for input(s): VANCOTROUGH, VANCOPEAK, VANCORANDOM, GENTTROUGH, GENTPEAK, GENTRANDOM, TOBRATROUGH, TOBRAPEAK, TOBRARND, AMIKACINPEAK, AMIKACINTROU, AMIKACIN in the last 72 hours.   Microbiology: Recent Results (from the past 720 hour(s))  Blood culture (routine x 2)     Status: None (Preliminary result)   Collection Time: 10/19/15  8:00 PM  Result Value Ref Range Status   Specimen Description BLOOD RIGHT ARM  Final   Special Requests BOTTLES DRAWN AEROBIC AND ANAEROBIC 6CC  Final   Culture PENDING  Incomplete   Report Status PENDING  Incomplete  Blood culture (routine x 2)     Status: None (Preliminary result)   Collection Time: 10/19/15  8:12 PM  Result Value Ref Range Status   Specimen Description BLOOD RIGHT HAND  Final   Special Requests BOTTLES DRAWN AEROBIC AND ANAEROBIC 6CC  Final   Culture PENDING  Incomplete   Report Status PENDING  Incomplete    Medical History: Past Medical History  Diagnosis Date  . Diabetes mellitus without complication (HCC)   . Hypertension   . Tobacco abuse   . Use of cane as ambulatory aid   . Leg edema, right     chronic  . Stroke (HCC)   . CHF (congestive heart failure) (HCC)   . COPD (chronic obstructive pulmonary disease) (HCC)     on O2  . Anxiety   . Respiratory failure (HCC)   . Hyperlipidemia   . Dysphagia     Medications:  Scheduled:  . aspirin EC  81 mg Oral Daily  . digoxin  0.125 mg Oral Daily  . diltiazem  30 mg Oral  QID  . enoxaparin (LOVENOX) injection  40 mg Subcutaneous Q24H  . furosemide  20 mg Intravenous Q12H  . guaiFENesin  600 mg Oral BID  . insulin aspart  0-9 Units Subcutaneous TID WC  . ipratropium  0.5 mg Nebulization Q6H  . levalbuterol  0.63 mg Nebulization Q6H  . methylPREDNISolone (SOLU-MEDROL) injection  60 mg Intravenous Q6H  . potassium chloride  10 mEq Oral BID   Infusions:  . sodium chloride    . ceFEPime (MAXIPIME) IV    . vancomycin     PRN: HYDROcodone-acetaminophen, ondansetron **OR** ondansetron (ZOFRAN) IV, traZODone  Assessment: 70 yo female admitted for acute respiratory failure from HCAP, COPD, and CHF. Starting vanc and cefepime  Goal of Therapy:  Vancomycin trough level 15-20 mcg/ml  Plan:  Preliminary review of pertinent patient information completed.  Protocol will be initiated with a one-time dose(s) of vancomycin 1 gram at 0500, 8 hours after previous 1 gram given in ED and cefepime 2 grams x 1.  [facility] clinical pharmacist will complete review during morning rounds to assess patient and finalize treatment regimen.  Cranford Blessinger Scarlett, RPH 10/20/2015,5:02 AM

## 2015-10-20 NOTE — ED Notes (Signed)
Pt resting with eyes closed, resp even and nonlabored.

## 2015-10-20 NOTE — Progress Notes (Signed)
Patient ID: Tara Park, female   DOB: August 12, 1945, 70 y.o.   MRN: [id number]      date is 10/19/2015 Facility; [facility] This is an acute visit  Chief complaint acute visit secondary to shortness of breath increased edema weight gain  HPI--; 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 She was a seen here for one day and then return to the hospital from 11/19 through 11/22 with COPD acute. She was rehospitalized from 2/5 through 2/13 with refractory atrial fibrillation and again COPD acute. In January she was aggressive and psychotic.  Dr. Leanord Hawkingobson tapered her steroids off and this improved. She also has diastolic heart failure .   Per chart review it appears she has gained a significant amount of weight recently currently 153.2 which is 3 pounds more than yesterday.  She has been around 150 back in February and she was started on low-dose Lasix and weight did moderate down into the mid lower 140s but now appears to be gradually rising especially in the last couple days.  She does appear to have increased edema and she is complaining of some increased shortness breath and has mildly labored breathing and increased respiratory rate today.  O2 saturation continues to be in the 90s however she is now on 4 L of oxygen.  .  Past Medical History  Diagnosis Date  . Diabetes mellitus without complication (HCC)   . Hypertension   . Tobacco abuse   . Use of cane as ambulatory aid   . Leg edema, right     chronic  . Stroke (HCC)   . CHF (congestive heart failure) (HCC)   . COPD (chronic obstructive pulmonary disease) (HCC)     on O2  . Anxiety     Current medications Albuterol nebulizers 2.5 every 2 when necessary ASA 81 daily Plavix 75 daily Digoxin 0.125 by mouth daily Diltiazem 30 mg 4 times a day Guaifenesin 12 hour twice a  day Duonebs3 times a day routine Lasix 20 a day Melatonin 3 mg at bedtime Metformin 500 twice a day KCl 10 mEq daily Pravastatin 80 daily Zoloft 50 daily Trazodone 50 mg daily at bedtime when necessary  Most recent labs.  09/22/2015.  Sodium 135 potassium 3.8 BUN 12 creatinine 0.39.  09/15/2015.  WBC 12.9 hemoglobin 10.9 platelets 208  BMP Latest Ref Rng 09/22/2015 09/15/2015 09/08/2015  Glucose 65 - 99 mg/dL 409(W240(H) 119(J199(H) 478(G121(H)  BUN 6 - 20 mg/dL 12 16 18   Creatinine 0.44 - 1.00 mg/dL 9.56(O0.39(L) 1.30(Q0.34(L) <6.57(Q<0.30(L)  Sodium 135 - 145 mmol/L 135 139 141  Potassium 3.5 - 5.1 mmol/L 3.8 3.6 4.0  Chloride 101 - 111 mmol/L 97(L) 96(L) 106  CO2 22 - 32 mmol/L 29 33(H) 28  Calcium 8.9 - 10.3 mg/dL 7.9(L) 7.9(L) 7.4(L)    CBC Latest Ref Rng 09/15/2015 09/09/2015 09/08/2015  WBC 4.0 - 10.5 K/uL 12.9(H) 19.0(H) 17.4(H)  Hemoglobin 12.0 - 15.0 g/dL 10.9(L) 10.1(L) 10.6(L)  Hematocrit 36.0 - 46.0 % 34.6(L) 32.1(L) 32.8(L)  Platelets 150 - 400 K/uL 208 180 197    Review of systems Gen. Says she does not feel well some shortness of breath. Skin does not complain of diaphoresis rashes or itching.  Eyes is not complaining of visual changes.  Nose mouth and throat-says she does have a sore throat does not complain of nasal discharge.  Respiratory as  noted above is complaining of shortness of breath has a cough.  Cardiac does not complain of chest pain but does have increased edema.  GI is not complaining of nausea vomiting diarrhea constipation or abdominal discomfort this time.  GU is not complaining of dysuria.  Muscle skeletal is complaining of weakness currently not complaining of acute joint pain.  Neurologic is not complaining of dizziness headache or syncopal-type feelings.  Psych does have a history of anxiety but this appears relatively well controlled recently.       Physical examination Temperature 97.8 pulse 105 respirations 24 blood pressure 112/54 O2 saturation 97%  on 4 L oxygen.  In general this is a frail elderly female in no acute distress but does appear somewhat uncomfortable with mildly labored breathing and use of accessory muscles.  Skin is warm and dry.  Eyes pupils appear reactive to light sclera and conjunctiva are relatively care visual acuity appears intact.  Oropharynx is membranes per moist she does appear to have a thin white female monotonic.  Chest she does have again some mildly labored breathing and increased respiratory rate and diffuse congestion.  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  Abdomen is soft does not appear to be acutely tender positive bowel sounds.  Musculoskeletal has significant lower extremity weakness as well as left-sided hemiparalysis again increased edema of her left upper extremity.  Neurologic as stated above.  Psych she appears alert and oriented pleasant and appropriate although a bit weak and uncomfortable     Impression/plan  Shortness of breath with some labored breathing weight gain increased edema-one would be concerned about developing acute process-CHF related versus COPD exasperation possibly an element of both--patient is a very fragile individual I'm concerned about this deteriorating in fairly quick fashion-will send her to the ER for evaluation I suspect she may need aggressive diuresis and respiratory support-she is on duo nebs 3 times a day this was increased by Dr. Leanord Hawking recently and on Lasix 20 mg a day-again I suspect she will need aggressive diuresis.   [id number]

## 2015-10-20 NOTE — Progress Notes (Signed)
Patient with H CAP as well as UTI. Currently on vancomycin and cefepime per pharmacy significant COPD per chest x-ray also she resides at [facility] Tara GuyJane F Park [id number] DOB: Nov 17, 1945 DOA: 10/19/2015 PCP: [name],Essance Gatti M, MD             Physical Exam: Blood pressure 114/60, pulse 97, temperature 97.4 F (36.3 C), temperature source Oral, resp. rate 19, height 5\' 6"  (1.676 m), weight 151 lb 14.4 oz (68.9 kg), SpO2 95 %. Lungs show scattered rhonchi prolonged history phase no wheeze no rales audible heart regular no S3 or S4 no heaves thrills rubs abdomen soft nontender bowel sounds normoactive   Investigations:  Recent Results (from the past 240 hour(s))  Blood culture (routine x 2)     Status: None (Preliminary result)   Collection Time: 10/19/15  8:00 PM  Result Value Ref Range Status   Specimen Description BLOOD RIGHT ARM  Final   Special Requests BOTTLES DRAWN AEROBIC AND ANAEROBIC 6CC  Final   Culture NO GROWTH < 24 HOURS  Final   Report Status PENDING  Incomplete  Blood culture (routine x 2)     Status: None (Preliminary result)   Collection Time: 10/19/15  8:12 PM  Result Value Ref Range Status   Specimen Description BLOOD RIGHT HAND  Final   Special Requests BOTTLES DRAWN AEROBIC AND ANAEROBIC 6CC  Final   Culture NO GROWTH < 24 HOURS  Final   Report Status PENDING  Incomplete     Basic Metabolic Panel:  Recent Labs  81/19/1403/29/17 1829 10/20/15 0429  NA 134* 135  K 3.4* 3.2*  CL 92* 92*  CO2 32 34*  GLUCOSE 112* 138*  BUN 10 11  CREATININE 0.47 0.46  CALCIUM 7.6* 7.4*   Liver Function Tests:  Recent Labs  10/20/15 0429  AST 45*  ALT 29  ALKPHOS 309*  BILITOT 0.6  PROT 4.7*  ALBUMIN 1.7*     CBC:  Recent Labs  10/19/15 1829 10/20/15 0429  WBC 32.7* 16.4*  NEUTROABS 29.3*  --   HGB 12.6 10.6*  HCT 39.0 33.3*  MCV 76.9* 77.1*  PLT 333 251    Dg Chest Portable 1 View  10/19/2015  CLINICAL DATA:  Cough and shortness  of breath EXAM: PORTABLE CHEST 1 VIEW COMPARISON:  08/30/2015 chest radiograph. FINDINGS: Stable cardiomediastinal silhouette with normal heart size. No pneumothorax. No pleural effusion. Emphysema. Stable reticular opacities at both lung apices, in keeping with mild pleural-parenchymal scarring. No pulmonary edema. New mild patchy opacity at the right lung base. IMPRESSION: Emphysema. New mild patchy opacity at the right lung base, favor atelectasis, cannot entirely exclude a mild/ developing pneumonia. Electronically Signed   By: Delbert PhenixJason A Poff M.D.   On: 10/19/2015 18:10      Medications:   Impression:  Active Problems:   Chronic systolic CHF (congestive heart failure) (HCC)   COPD (chronic obstructive pulmonary disease) (HCC)   A-fib (HCC)   HCAP (healthcare-associated pneumonia)   Pressure ulcer     Plan: We'll add albuterol nebulizer 4 times a day   Consultants: Pulmonary requested   Procedures   Antibiotics: Maxipime and vancomycin                  Code Status:  Family Communication:    Disposition Plan   Time spent: 30 minutes   LOS: 1 day   Rhyatt Muska M   10/20/2015, 12:42 PM

## 2015-10-20 NOTE — ED Notes (Signed)
Cleansed of urine incontinence. Linens changed.  Sacral Allevyn (pink dressing) applied. +redness. No open areas.

## 2015-10-21 LAB — GLUCOSE, CAPILLARY
GLUCOSE-CAPILLARY: 99 mg/dL (ref 65–99)
Glucose-Capillary: 174 mg/dL — ABNORMAL HIGH (ref 65–99)

## 2015-10-21 LAB — CBC WITH DIFFERENTIAL/PLATELET
BASOS ABS: 0 10*3/uL (ref 0.0–0.1)
Basophils Relative: 0 %
Eosinophils Absolute: 0 10*3/uL (ref 0.0–0.7)
Eosinophils Relative: 0 %
HEMATOCRIT: 31.7 % — AB (ref 36.0–46.0)
Hemoglobin: 10.4 g/dL — ABNORMAL LOW (ref 12.0–15.0)
LYMPHS ABS: 1 10*3/uL (ref 0.7–4.0)
LYMPHS PCT: 5 %
MCH: 24.7 pg — AB (ref 26.0–34.0)
MCHC: 32.8 g/dL (ref 30.0–36.0)
MCV: 75.3 fL — AB (ref 78.0–100.0)
Monocytes Absolute: 0.4 10*3/uL (ref 0.1–1.0)
Monocytes Relative: 2 %
NEUTROS ABS: 19 10*3/uL — AB (ref 1.7–7.7)
Neutrophils Relative %: 93 %
Platelets: 285 10*3/uL (ref 150–400)
RBC: 4.21 MIL/uL (ref 3.87–5.11)
RDW: 16.3 % — ABNORMAL HIGH (ref 11.5–15.5)
WBC: 20.5 10*3/uL — AB (ref 4.0–10.5)

## 2015-10-21 LAB — BASIC METABOLIC PANEL
ANION GAP: 8 (ref 5–15)
BUN: 15 mg/dL (ref 6–20)
CHLORIDE: 92 mmol/L — AB (ref 101–111)
CO2: 35 mmol/L — ABNORMAL HIGH (ref 22–32)
Calcium: 7.6 mg/dL — ABNORMAL LOW (ref 8.9–10.3)
Creatinine, Ser: 0.4 mg/dL — ABNORMAL LOW (ref 0.44–1.00)
GFR calc Af Amer: >60 mL/min (ref >60)
GLUCOSE: 151 mg/dL — AB (ref 65–99)
POTASSIUM: 2.7 mmol/L — AB (ref 3.5–5.1)
SODIUM: 135 mmol/L (ref 135–145)

## 2015-10-21 LAB — HEMOGLOBIN A1C
Hgb A1c MFr Bld: 6.9 % — ABNORMAL HIGH (ref 4.8–5.6)
MEAN PLASMA GLUCOSE: 151 mg/dL

## 2015-10-21 MED ORDER — POTASSIUM CHLORIDE 10 MEQ/100ML IV SOLN
10.0000 meq | INTRAVENOUS | Status: AC
  Administered 2015-10-21 (×4): 10 meq via INTRAVENOUS
  Filled 2015-10-21 (×4): qty 100

## 2015-10-21 MED ORDER — IPRATROPIUM BROMIDE 0.02 % IN SOLN
0.5000 mg | Freq: Three times a day (TID) | RESPIRATORY_TRACT | Status: DC
  Administered 2015-10-22 – 2015-10-27 (×15): 0.5 mg via RESPIRATORY_TRACT
  Filled 2015-10-21 (×15): qty 2.5

## 2015-10-21 NOTE — Clinical Social Work Note (Signed)
Clinical Social Work Assessment  Patient Details  Name: Tara Park MRN: [id number] Date of Birth: 11-23-1945  Date of referral:  10/21/15               Reason for consult:  Other (Comment Required) (from [facility])                Permission sought to share information with:  Case Manager, Magazine features editoracility Contact Representative, Family Supports Permission granted to share information::  Yes, Verbal Permission Granted  Name::        Agency::  [facility]  Relationship::  Tara Park ([facility])   Contact Information:     Housing/Transportation Living arrangements for the past 2 months:  Skilled Nursing Facility Source of Information:  Medical Team, Facility, Adult Children Patient Interpreter Needed:  None Criminal Activity/Legal Involvement Pertinent to Current Situation/Hospitalization:  No - Comment as needed Significant Relationships:  Adult Children, Merchandiser, retailCommunity Support, Other Family Members Lives with:  Self, Facility Resident Do you feel safe going back to the place where you live?  Yes Need for family participation in patient care:  Yes (Comment)  Care giving concerns:  No care giving concerns noted at this time. Patient is a long term resident at [facility] and plans to return. Positive and abundant family support.  Facility agreeable for return when medically stable.   Social Worker assessment / plan:  LCSW made aware that patient is from SNF as a LT resident in progression meeting. Patient and facility confirmed that patient will return at DC.  No barriers or concerns noted at this time.  No family at the bedside. Contact to be made for additional information as needed. LCSW updated Fl2 and will follow acutely while patient is in hospital. Plan will be for patient to DC back to SNF when medically stable. MD to sign FL2.  Employment status:  Retired Health and safety inspectornsurance information:  Medicare PT Recommendations:  Not assessed at this time Information / Referral to community  resources:  Other (Comment Required) (none noted)  Patient/Family's Response to care:  Agreeable to plan  Patient/Family's Understanding of and Emotional Response to Diagnosis, Current Treatment, and Prognosis:  No concerns to be addressed at this time. Will reach out to family for more information as warranted and needed.    Emotional Assessment Appearance:  Appears stated age Attitude/Demeanor/Rapport:  Other (cooperative, engaged) Affect (typically observed):  Accepting, Adaptable, Pleasant Orientation:  Oriented to Self, Oriented to Place, Oriented to Situation Alcohol / Substance use:  Not Applicable Psych involvement (Current and /or in the community):  No (Comment)  Discharge Needs  Concerns to be addressed:  No discharge needs identified Readmission within the last 30 days:  Yes Current discharge risk:  None Barriers to Discharge:  No Barriers Identified, Continued Medical Work up   Tara Park, Tara Brallier N, LCSW 10/21/2015, 11:03 AM

## 2015-10-21 NOTE — Consult Note (Signed)
Consult requested by: Dr. 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 She had been in her usual state of poor health at home the nursing home when she developed increasing cough and congestion. She came to the emergency department and was found to have pneumonia. She was admitted and is currently on healthcare associated pneumonia antibiotic coverage and says she feels a little better. In addition to her COPD she has heart failure. She has a history of chronic atrial fib.  Past Medical History  Diagnosis Date  . Diabetes mellitus without complication (HCC)   . Hypertension   . Tobacco abuse   . Use of cane as ambulatory aid   . Leg edema, right     chronic  . Stroke (HCC)   . CHF (congestive heart failure) (HCC)   . COPD (chronic obstructive pulmonary disease) (HCC)     on O2  . Anxiety   . Respiratory failure (HCC)   . Hyperlipidemia   . Dysphagia      Family History  Problem Relation Age of Onset  . Transient ischemic attack Mother   . COPD Father   . Heart disease Brother      Social History   Social History  . Marital Status: Divorced    Spouse Name: N/A  . Number of Children: N/A  . Years of Education: N/A   Social History Main Topics  . Smoking status: Current Every Day Smoker    Types: Cigarettes  . Smokeless tobacco: None  . Alcohol Use: No  . Drug Use: No  . Sexual Activity: No   Other Topics Concern  . None   Social History Narrative     ROS: She says that they slide she's short of breath with minimal exertion. She has not had any chest pain. No hemoptysis. She is coughing up sputum. No definite fever or chills. Otherwise per the history and physical    Objective: Vital signs in last 24 hours: Temp:  [97.4 F (36.3 C)-97.7 F (36.5 C)] 97.6 F (36.4 C) (03/31 0457) Pulse Rate:  [88-100] 88  (03/31 0457) Resp:  [18-22] 21 (03/31 0457) BP: (94-116)/(50-93) 116/93 mmHg (03/31 0457) SpO2:  [90 %-99 %] 95 % (03/31 0457) Weight:  [68.9 kg (151 lb 14.4 oz)] 68.9 kg (151 lb 14.4 oz) (03/30 1108) Weight change:  Last BM Date: 10/19/15  Intake/Output from previous day: 03/30 0701 - 03/31 0700 In: 720 [P.O.:720] Out: -   PHYSICAL EXAM She is awake and alert. She is wearing oxygen. She looks mildly short of breath at rest. Her pupils are reactive. Her nose and throat are clear. Mucous membranes are moist. Her neck is supple without masses. Her chest shows some rhonchi and rales bilaterally. Her heart is not regular. Her abdomen is soft without masses. She still has edema. Central nervous system exam grossly intact  Lab Results: Basic Metabolic Panel:  Recent Labs  40/98/11 0429 10/21/15 0644  NA 135 135  K 3.2* 2.7*  CL 92* 92*  CO2 34* 35*  GLUCOSE 138* 151*  BUN 11 15  CREATININE 0.46 0.40*  CALCIUM 7.4* 7.6*   Liver Function Tests:  Recent Labs  10/20/15 0429  AST 45*  ALT 29  ALKPHOS 309*  BILITOT 0.6  PROT 4.7*  ALBUMIN 1.7*   No results for input(s): LIPASE, AMYLASE in the last 72 hours. No results  for input(s): AMMONIA in the last 72 hours. CBC:  Recent Labs  10/19/15 1829 10/20/15 0429 10/21/15 0644  WBC 32.7* 16.4* 20.5*  NEUTROABS 29.3*  --  19.0*  HGB 12.6 10.6* 10.4*  HCT 39.0 33.3* 31.7*  MCV 76.9* 77.1* 75.3*  PLT 333 251 285   Cardiac Enzymes:  Recent Labs  10/19/15 1829  TROPONINI 0.03   BNP: No results for input(s): PROBNP in the last 72 hours. D-Dimer: No results for input(s): DDIMER in the last 72 hours. CBG:  Recent Labs  10/20/15 0731 10/20/15 1211 10/20/15 1631 10/20/15 2150  GLUCAP 169* 191* 214* 165*   Hemoglobin A1C:  Recent Labs  10/20/15 0425  HGBA1C 6.9*   Fasting Lipid Panel: No results for input(s): CHOL, HDL, LDLCALC, TRIG, CHOLHDL, LDLDIRECT in the last 72 hours. Thyroid Function Tests: No  results for input(s): TSH, T4TOTAL, FREET4, T3FREE, THYROIDAB in the last 72 hours. Anemia Panel: No results for input(s): VITAMINB12, FOLATE, FERRITIN, TIBC, IRON, RETICCTPCT in the last 72 hours. Coagulation: No results for input(s): LABPROT, INR in the last 72 hours. Urine Drug Screen: Drugs of Abuse     Component Value Date/Time   LABOPIA NONE DETECTED 05/31/2015 2020   COCAINSCRNUR NONE DETECTED 05/31/2015 2020   LABBENZ NONE DETECTED 05/31/2015 2020   AMPHETMU NONE DETECTED 05/31/2015 2020   THCU POSITIVE* 05/31/2015 2020   LABBARB NONE DETECTED 05/31/2015 2020    Alcohol Level: No results for input(s): ETH in the last 72 hours. Urinalysis:  Recent Labs  10/19/15 2005  COLORURINE YELLOW  LABSPEC 1.005  PHURINE 7.0  GLUCOSEU NEGATIVE  HGBUR TRACE*  BILIRUBINUR NEGATIVE  KETONESUR NEGATIVE  PROTEINUR NEGATIVE  NITRITE POSITIVE*  LEUKOCYTESUR LARGE*   Misc. Labs:   ABGS: No results for input(s): PHART, PO2ART, TCO2, HCO3 in the last 72 hours.  Invalid input(s): PCO2   MICROBIOLOGY: Recent Results (from the past 240 hour(s))  Blood culture (routine x 2)     Status: None (Preliminary result)   Collection Time: 10/19/15  8:00 PM  Result Value Ref Range Status   Specimen Description BLOOD RIGHT ARM  Final   Special Requests BOTTLES DRAWN AEROBIC AND ANAEROBIC 6CC  Final   Culture NO GROWTH < 24 HOURS  Final   Report Status PENDING  Incomplete  Blood culture (routine x 2)     Status: None (Preliminary result)   Collection Time: 10/19/15  8:12 PM  Result Value Ref Range Status   Specimen Description BLOOD RIGHT HAND  Final   Special Requests BOTTLES DRAWN AEROBIC AND ANAEROBIC 6CC  Final   Culture NO GROWTH < 24 HOURS  Final   Report Status PENDING  Incomplete  MRSA PCR Screening     Status: None   Collection Time: 10/20/15 12:15 PM  Result Value Ref Range Status   MRSA by PCR NEGATIVE NEGATIVE Final    Comment:        The GeneXpert MRSA Assay  (FDA approved for NASAL specimens only), is one component of a comprehensive MRSA colonization surveillance program. It is not intended to diagnose MRSA infection nor to guide or monitor treatment for MRSA infections.     Studies/Results: Dg Chest Portable 1 View  10/19/2015  CLINICAL DATA:  Cough and shortness of breath EXAM: PORTABLE CHEST 1 VIEW COMPARISON:  08/30/2015 chest radiograph. FINDINGS: Stable cardiomediastinal silhouette with normal heart size. No pneumothorax. No pleural effusion. Emphysema. Stable reticular opacities at both lung apices, in keeping with mild pleural-parenchymal scarring. No pulmonary edema.  New mild patchy opacity at the right lung base. IMPRESSION: Emphysema. New mild patchy opacity at the right lung base, favor atelectasis, cannot entirely exclude a mild/ developing pneumonia. Electronically Signed   By: Delbert PhenixJason A Poff M.D.   On: 10/19/2015 18:10    Medications:  Prior to Admission:  Prescriptions prior to admission  Medication Sig Dispense Refill Last Dose  . albuterol (PROVENTIL) (2.5 MG/3ML) 0.083% nebulizer solution Take 2.5 mg by nebulization 3 (three) times daily. *may be given every 2 hours as needed for breathing/shortness of breath   unknown  . Amino Acids-Protein Hydrolys (FEEDING SUPPLEMENT, PRO-STAT 64,) LIQD Take 30 mLs by mouth 2 (two) times daily.   10/19/2015 at 900a  . aspirin EC 81 MG tablet Take 81 mg by mouth daily.   10/19/2015 at 900a  . clopidogrel (PLAVIX) 75 MG tablet Take 1 tablet (75 mg total) by mouth daily with breakfast. 30 tablet 3 10/19/2015 at 900a  . clotrimazole (LOTRIMIN) 1 % cream Apply 1 application topically 4 (four) times daily as needed (to corners of mouth/lips as needed).   unknown  . digoxin (LANOXIN) 0.125 MG tablet Take 1 tablet (0.125 mg total) by mouth daily. 30 tablet 3 10/19/2015 at 900a  . diltiazem (CARDIZEM) 30 MG tablet Take 1 tablet (30 mg total) by mouth 4 (four) times daily. 120 tablet 3 10/19/2015 at  Unknown time  . fluticasone (FLOVENT HFA) 44 MCG/ACT inhaler Inhale 2 puffs into the lungs 2 (two) times daily.   10/19/2015 at 900a  . furosemide (LASIX) 20 MG tablet Take 20 mg by mouth daily.    10/19/2015 at 900a  . guaiFENesin (MUCINEX) 600 MG 12 hr tablet Take 1 tablet (600 mg total) by mouth 2 (two) times daily. 14 tablet 0 10/19/2015 at 900a  . HYDROcodone-acetaminophen (NORCO) 5-325 MG tablet Take one tablet by mouth every 6 hours as needed for pain. Max APAP 3gm/24hrs from all sources 120 tablet 0 unknown  . ipratropium-albuterol (DUONEB) 0.5-2.5 (3) MG/3ML SOLN Take 3 mLs by nebulization 2 (two) times daily. (Patient taking differently: Take 3 mLs by nebulization 3 (three) times daily. ) 360 mL 3 10/19/2015 at Unknown time  . Melatonin 3 MG TABS Take 3 mg by mouth at bedtime.   10/18/2015 at 2100  . metFORMIN (GLUCOPHAGE) 500 MG tablet Take 500 mg by mouth 2 (two) times daily with a meal.   10/19/2015 at 900a  . nystatin (MYCOSTATIN) 100000 UNIT/ML suspension Take 5 mLs by mouth 4 (four) times daily.   10/19/2015 at Unknown time  . OXYGEN Inhale 2 L into the lungs daily.   10/19/2015 at Unknown time  . potassium chloride (K-DUR,KLOR-CON) 10 MEQ tablet Take 10 mEq by mouth daily.    10/19/2015 at 900a  . pravastatin (PRAVACHOL) 40 MG tablet Take 2 tablets (80 mg total) by mouth every evening. 30 tablet 0 10/18/2015 at 2100  . saccharomyces boulardii (FLORASTOR) 250 MG capsule Take 250 mg by mouth 2 (two) times daily.   10/19/2015 at 900a  . senna-docusate (SENOKOT-S) 8.6-50 MG tablet Take 1 tablet by mouth at bedtime as needed for mild constipation. 20 tablet 0 unknown  . sertraline (ZOLOFT) 50 MG tablet Take 50 mg by mouth every morning.   10/19/2015 at 900a  . traZODone (DESYREL) 50 MG tablet Take 1 tablet (50 mg total) by mouth at bedtime as needed for sleep.   unknown  . trypsin-balsam-castor oil (XENADERM) ointment Apply 1 application topically daily. **Apply to coccyx, bilateral buttocls, and  saccrum at every shift and as needed**   10/19/2015 at Unknown time   Scheduled: . aspirin EC  81 mg Oral Daily  . ceFEPime (MAXIPIME) IV  1 g Intravenous 3 times per day  . digoxin  0.125 mg Oral Daily  . diltiazem  30 mg Oral QID  . enoxaparin (LOVENOX) injection  40 mg Subcutaneous Q24H  . furosemide  20 mg Intravenous Q12H  . guaiFENesin  600 mg Oral BID  . insulin aspart  0-9 Units Subcutaneous TID WC  . ipratropium  0.5 mg Nebulization Q6H WA  . levalbuterol  0.63 mg Nebulization TID  . methylPREDNISolone (SOLU-MEDROL) injection  60 mg Intravenous Q6H  . potassium chloride  20 mEq Oral Daily  . potassium chloride  10 mEq Oral BID  . vancomycin  500 mg Intravenous Q12H   Continuous: . sodium chloride     ZOX:[id number], ondansetron **OR** ondansetron (ZOFRAN) IV, RESOURCE THICKENUP CLEAR, traZODone  Assesment: She has healthcare associated pneumonia and is on appropriate treatment. She says she feels a little bit better already. She's been treated for acute on chronic heart failure also. Active Problems:   Chronic systolic CHF (congestive heart failure) (HCC)   COPD (chronic obstructive pulmonary disease) (HCC)   A-fib (HCC)   HCAP (healthcare-associated pneumonia)   Pressure ulcer    Plan: I think she is on appropriate treatment now. I would continue with this. She will probably need about 5 days of IV antibiotics.    LOS: 2 days   Tallen Schnorr L 10/21/2015, 9:00 AM

## 2015-10-21 NOTE — Care Management Important Message (Signed)
Important Message  Patient Details  Name: Tara GuyJane F Montminy MRN: [id number] Date of Birth: 06-01-1946   Medicare Important Message Given:  Yes    Adonis HugueninBerkhead, Chevon Laufer L, RN 10/21/2015, 8:46 AM

## 2015-10-21 NOTE — NC FL2 (Signed)
[place] MEDICAID FL2 LEVEL OF CARE SCREENING TOOL     IDENTIFICATION  Patient Name: Tara Park Birthdate: Dec 10, 1945 Sex: female Admission Date (Current Location): 10/19/2015  [facility] and [place] Number:  [employer] and Address:  [facility],  [address], [place] [address]      Provider Number: [id number]  Attending Physician Name and Address:  Oval Linsey, MD  Relative Name and Phone Number:       Current Level of Care: Hospital Recommended Level of Care: Skilled Nursing Facility Prior Approval Number:    Date Approved/Denied:   PASRR Number:    Discharge Plan: SNF    Current Diagnoses: Patient Active Problem List   Diagnosis Date Noted  . Pressure ulcer 10/20/2015  . HCAP (healthcare-associated pneumonia) 10/19/2015  . Leukocytosis 09/07/2015  . Diarrhea 09/07/2015  . New onset a-fib (HCC) 08/28/2015  . A-fib (HCC) 08/28/2015  . Weight gain 08/27/2015  . Edema 08/27/2015  . Subacute confusional state 08/02/2015  . Palpitations 06/14/2015  . Oral thrush 06/14/2015  . UTI (urinary tract infection) 06/13/2015  . Drowsiness 06/12/2015  . Aspiration pneumonia (HCC) 06/11/2015  . Demand ischemia (HCC) 06/11/2015  . Hypokalemia 06/11/2015  . Chronic systolic CHF (congestive heart failure) (HCC) 06/11/2015  . 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 COPD (chronic obstructive pulmonary disease) (HCC) 06/11/2015  . Chronic hypoxemic respiratory failure (HCC) 06/11/2015  . Acute ischemic stroke (HCC) 05/31/2015  . COPD exacerbation (HCC) 05/31/2015  . Tobacco abuse   . Diabetes mellitus without complication (HCC)   . Hypertension   . BURSITIS, HIP 09/09/2007  . TRIGGER FINGER 09/09/2007  . FRACTURE, FEMUR, INTERTROCHANTERIC REGION 09/09/2007  . DIABETES 08/01/2007    Orientation RESPIRATION BLADDER Height & Weight     Self, Time, Place  O2 (2L) Incontinent Weight: 151 lb 14.4 oz (68.9 kg) Height:    (167.6 cm)  BEHAVIORAL SYMPTOMS/MOOD NEUROLOGICAL BOWEL NUTRITION STATUS  Other (Comment) (none)   Incontinent Diet (Carb modified)  AMBULATORY STATUS COMMUNICATION OF NEEDS Skin   Extensive Assist Verbally PU Stage and Appropriate Care (Stage I -  Intact skin with non-blanchable redness of a localized area usually over a bony prominence.) PU Stage 1 Dressing: BID                     Personal Care Assistance Level of Assistance  Bathing, Feeding, Dressing Bathing Assistance: Limited assistance Feeding assistance: Limited assistance Dressing Assistance: Limited assistance     Functional Limitations Info  Sight, Hearing, Speech Sight Info: Adequate Hearing Info: Adequate Speech Info: Adequate    SPECIAL CARE FACTORS FREQUENCY  PT (By licensed PT), OT (By licensed OT)                    Contractures Contractures Info: Not present    Additional Factors Info  Code Status, Allergies, Psychotropic, Insulin Sliding Scale, Isolation Precautions Code Status Info: Full Allergies Info: NKA Psychotropic Info: none Insulin Sliding Scale Info: 0-9 units 3x a day Isolation Precautions Info: MRSA negative     Current Medications (10/21/2015):  This is the current hospital active medication list Current Facility-Administered Medications  Medication Dose Route Frequency Provider Last Rate Last Dose  . 0.9 %  sodium chloride infusion   Intravenous Continuous Meredeth Ide, MD      . aspirin EC tablet 81 mg  81 mg Oral Daily Meredeth Ide, MD   81 mg at 10/20/15 1010  .  ceFEPIme (MAXIPIME) 1 g in dextrose 5 % 50 mL IVPB  1 g Intravenous 3 times per day Oval Linseyichard Dondiego, MD   1 g at 10/21/15 0431  . digoxin (LANOXIN) tablet 0.125 mg  0.125 mg Oral Daily Meredeth IdeGagan S Lama, MD   0.125 mg at 10/20/15 1010  . diltiazem (CARDIZEM) tablet 30 mg  30 mg Oral QID Meredeth IdeGagan S Lama, MD   30 mg at 10/20/15 2238  . enoxaparin (LOVENOX) injection 40 mg  40 mg Subcutaneous Q24H Oval Linseyichard Dondiego, MD   40 mg at  10/21/15 0432  . furosemide (LASIX) injection 20 mg  20 mg Intravenous Q12H Meredeth IdeGagan S Lama, MD   20 mg at 10/21/15 0432  . guaiFENesin (MUCINEX) 12 hr tablet 600 mg  600 mg Oral BID Meredeth IdeGagan S Lama, MD   600 mg at 10/20/15 2238  . HYDROcodone-acetaminophen (NORCO/VICODIN) 5-325 MG per tablet 1 tablet  1 tablet Oral Q4H PRN Meredeth IdeGagan S Lama, MD      . insulin aspart (novoLOG) injection 0-9 Units  0-9 Units Subcutaneous TID WC Meredeth IdeGagan S Lama, MD   3 Units at 10/20/15 1702  . ipratropium (ATROVENT) nebulizer solution 0.5 mg  0.5 mg Nebulization Q6H WA Oval Linseyichard Dondiego, MD   0.5 mg at 10/21/15 0934  . levalbuterol (XOPENEX) nebulizer solution 0.63 mg  0.63 mg Nebulization TID Oval Linseyichard Dondiego, MD   0.63 mg at 10/21/15 0934  . methylPREDNISolone sodium succinate (SOLU-MEDROL) 125 mg/2 mL injection 60 mg  60 mg Intravenous Q6H Meredeth IdeGagan S Lama, MD   60 mg at 10/21/15 0432  . ondansetron (ZOFRAN) tablet 4 mg  4 mg Oral Q6H PRN Meredeth IdeGagan S Lama, MD       Or  . ondansetron (ZOFRAN) injection 4 mg  4 mg Intravenous Q6H PRN Meredeth IdeGagan S Lama, MD      . potassium chloride (KLOR-CON) packet 20 mEq  20 mEq Oral Daily Oval Linseyichard Dondiego, MD   20 mEq at 10/20/15 1318  . potassium chloride SA (K-DUR,KLOR-CON) CR tablet 10 mEq  10 mEq Oral BID Meredeth IdeGagan S Lama, MD   10 mEq at 10/20/15 2238  . RESOURCE THICKENUP CLEAR   Oral PRN Oval Linseyichard Dondiego, MD      . traZODone (DESYREL) tablet 50 mg  50 mg Oral QHS PRN Meredeth IdeGagan S Lama, MD   50 mg at 10/20/15 2238  . vancomycin (VANCOCIN) 500 mg in sodium chloride 0.9 % 100 mL IVPB  500 mg Intravenous Q12H Oval Linseyichard Dondiego, MD   500 mg at 10/21/15 [id number]     Discharge Medications: Please see discharge summary for a list of discharge medications.  Relevant Imaging Results:  Relevant Lab Results:   Additional Information    Raye SorrowCoble, Lucianne Smestad N, LCSW

## 2015-10-21 NOTE — Progress Notes (Signed)
Patient has a history of paroxysmal A. fib currently in normal sinus rhythm 2-D echo reveals normal left atrial chamber dimension and normal systolic function EF of 16-10% currently on dual antibiotics nebulizer therapy as well as steroids VIENNA FOLDEN RUE:[id number] DOB: 26-Oct-1945 DOA: 10/19/2015 PCP: Maricela Curet, MD             Physical Exam: Blood pressure 116/93, pulse 88, temperature 97.6 F (36.4 C), temperature source Oral, resp. rate 21, height '5\' 6"'  (1.676 m), weight 151 lb 14.4 oz (68.9 kg), SpO2 92 %. Lungs show diminished breath sounds in bases scattered rhonchi mild end expiratory wheeze no rales appreciable heart regular rhythm no S3-S4 no heaves thrills rubs abdomen soft nontender bowel sounds normoactive  Investigations:  Recent Results (from the past 240 hour(s))  Blood culture (routine x 2)     Status: None (Preliminary result)   Collection Time: 10/19/15  8:00 PM  Result Value Ref Range Status   Specimen Description BLOOD RIGHT ARM  Final   Special Requests BOTTLES DRAWN AEROBIC AND ANAEROBIC 6CC  Final   Culture NO GROWTH 2 DAYS  Final   Report Status PENDING  Incomplete  Blood culture (routine x 2)     Status: None (Preliminary result)   Collection Time: 10/19/15  8:12 PM  Result Value Ref Range Status   Specimen Description BLOOD RIGHT HAND  Final   Special Requests BOTTLES DRAWN AEROBIC AND ANAEROBIC 6CC  Final   Culture NO GROWTH 2 DAYS  Final   Report Status PENDING  Incomplete  MRSA PCR Screening     Status: None   Collection Time: 10/20/15 12:15 PM  Result Value Ref Range Status   MRSA by PCR NEGATIVE NEGATIVE Final    Comment:        The GeneXpert MRSA Assay (FDA approved for NASAL specimens only), is one component of a comprehensive MRSA colonization surveillance program. It is not intended to diagnose MRSA infection nor to guide or monitor treatment for MRSA infections.      Basic Metabolic Panel:  Recent Labs   10/20/15 0429 10/21/15 0644  NA 135 135  K 3.2* 2.7*  CL 92* 92*  CO2 34* 35*  GLUCOSE 138* 151*  BUN 11 15  CREATININE 0.46 0.40*  CALCIUM 7.4* 7.6*   Liver Function Tests:  Recent Labs  10/20/15 0429  AST 45*  ALT 29  ALKPHOS 309*  BILITOT 0.6  PROT 4.7*  ALBUMIN 1.7*     CBC:  Recent Labs  10/19/15 1829 10/20/15 0429 10/21/15 0644  WBC 32.7* 16.4* 20.5*  NEUTROABS 29.3*  --  19.0*  HGB 12.6 10.6* 10.4*  HCT 39.0 33.3* 31.7*  MCV 76.9* 77.1* 75.3*  PLT 333 251 285    Dg Chest Portable 1 View  10/19/2015  CLINICAL DATA:  Cough and shortness of breath EXAM: PORTABLE CHEST 1 VIEW COMPARISON:  08/30/2015 chest radiograph. FINDINGS: Stable cardiomediastinal silhouette with normal heart size. No pneumothorax. No pleural effusion. Emphysema. Stable reticular opacities at both lung apices, in keeping with mild pleural-parenchymal scarring. No pulmonary edema. New mild patchy opacity at the right lung base. IMPRESSION: Emphysema. New mild patchy opacity at the right lung base, favor atelectasis, cannot entirely exclude a mild/ developing pneumonia. Electronically Signed   By: Ilona Sorrel M.D.   On: 10/19/2015 18:10      Medications:   Impression:  Active Problems:   Chronic systolic CHF (congestive heart failure) (HCC)   COPD (chronic obstructive pulmonary disease) (  [place])   A-fib ([place])   HCAP (healthcare-associated pneumonia)   Pressure ulcer     Plan: Continue vancomycin and cefepime as ordered continue Xopenex nebulizer as ordered continue Solu-Medrol give KCl 10 mEq IV 4 runs check be met in a.m. as well as CBC Sunday  Consultants: Pulmonary   Procedures   Antibiotics: Vancomycin and Maxipime                  Code Status: Full  Family Communication:  Spoke with son today  Disposition Plan see plan above  Time spent: 30 minutes   LOS: 2 days   Gad Aymond M   10/21/2015, 12:44 PM

## 2015-10-22 LAB — URINE CULTURE
Culture: >=100000
Special Requests: NORMAL

## 2015-10-22 LAB — GLUCOSE, CAPILLARY
GLUCOSE-CAPILLARY: 207 mg/dL — AB (ref 65–99)
GLUCOSE-CAPILLARY: 200 mg/dL — AB (ref 65–99)
Glucose-Capillary: 140 mg/dL — ABNORMAL HIGH (ref 65–99)
Glucose-Capillary: 131 mg/dL — ABNORMAL HIGH (ref 65–99)
Glucose-Capillary: 190 mg/dL — ABNORMAL HIGH (ref 65–99)

## 2015-10-22 LAB — BASIC METABOLIC PANEL
Anion gap: 8 (ref 5–15)
BUN: 15 mg/dL (ref 6–20)
CALCIUM: 7.6 mg/dL — AB (ref 8.9–10.3)
CHLORIDE: 96 mmol/L — AB (ref 101–111)
CO2: 33 mmol/L — AB (ref 22–32)
CREATININE: 0.36 mg/dL — AB (ref 0.44–1.00)
GFR calc Af Amer: >60 mL/min (ref >60)
GFR calc non Af Amer: >60 mL/min (ref >60)
GLUCOSE: 156 mg/dL — AB (ref 65–99)
Potassium: 3.3 mmol/L — ABNORMAL LOW (ref 3.5–5.1)
Sodium: 137 mmol/L (ref 135–145)

## 2015-10-22 NOTE — Progress Notes (Signed)
Patient currently on dual antibiotics for H CAP as well as steroids and nebulizers potassium 2.7 given 4 runs of KCl yesterday today's electrolytes did not appear on computer screen awaiting these. Patient in sinus rhythm with normal LV systolic function Tara Park XKG:[id number] DOB: Mar 10, 1946 DOA: 10/19/2015 PCP: Maricela Curet, MD             Physical Exam: Blood pressure 116/70, pulse 79, temperature 97.3 F (36.3 C), temperature source Oral, resp. rate 17, height '5\' 6"'  (1.676 m), weight 151 lb 14.4 oz (68.9 kg), SpO2 95 %. lungs show scattered rhonchi mild end expiratory wheeze no rales appreciable heart regular rhythm no S3 or S4 no heaves thrills or rubs   Investigations:  Recent Results (from the past 240 hour(s))  Blood culture (routine x 2)     Status: None (Preliminary result)   Collection Time: 10/19/15  8:00 PM  Result Value Ref Range Status   Specimen Description BLOOD RIGHT ARM  Final   Special Requests BOTTLES DRAWN AEROBIC AND ANAEROBIC 6CC  Final   Culture NO GROWTH 2 DAYS  Final   Report Status PENDING  Incomplete  Urine culture     Status: None (Preliminary result)   Collection Time: 10/19/15  8:05 PM  Result Value Ref Range Status   Specimen Description URINE, CLEAN CATCH  Final   Special Requests Normal  Final   Culture   Final    >=100,000 COLONIES/mL GRAM NEGATIVE RODS Performed at [facility]    Report Status PENDING  Incomplete  Blood culture (routine x 2)     Status: None (Preliminary result)   Collection Time: 10/19/15  8:12 PM  Result Value Ref Range Status   Specimen Description BLOOD RIGHT HAND  Final   Special Requests BOTTLES DRAWN AEROBIC AND ANAEROBIC 6CC  Final   Culture NO GROWTH 2 DAYS  Final   Report Status PENDING  Incomplete  MRSA PCR Screening     Status: None   Collection Time: 10/20/15 12:15 PM  Result Value Ref Range Status   MRSA by PCR NEGATIVE NEGATIVE Final    Comment:        The GeneXpert MRSA Assay  (FDA approved for NASAL specimens only), is one component of a comprehensive MRSA colonization surveillance program. It is not intended to diagnose MRSA infection nor to guide or monitor treatment for MRSA infections.      Basic Metabolic Panel:  Recent Labs  10/20/15 0429 10/21/15 0644  NA 135 135  K 3.2* 2.7*  CL 92* 92*  CO2 34* 35*  GLUCOSE 138* 151*  BUN 11 15  CREATININE 0.46 0.40*  CALCIUM 7.4* 7.6*   Liver Function Tests:  Recent Labs  10/20/15 0429  AST 45*  ALT 29  ALKPHOS 309*  BILITOT 0.6  PROT 4.7*  ALBUMIN 1.7*     CBC:  Recent Labs  10/19/15 1829 10/20/15 0429 10/21/15 0644  WBC 32.7* 16.4* 20.5*  NEUTROABS 29.3*  --  19.0*  HGB 12.6 10.6* 10.4*  HCT 39.0 33.3* 31.7*  MCV 76.9* 77.1* 75.3*  PLT 333 251 285    No results found.    Medications:   Impression: Hypokalemia  Active Problems:   Chronic systolic CHF (congestive heart failure) (HCC)   COPD (chronic obstructive pulmonary disease) (HCC)   A-fib (HCC)   HCAP (healthcare-associated pneumonia)   Pressure ulcer     Plan: Await this mornings be met. CBC and be met scheduled for Sunday a.m. continue  dual antibiotics and steroid adjustment as per pulmonary  Consultants: Pulmonary   Procedures   Antibiotics: Cefepime and vancomycin                  Code Status: Full  Family Communication:  Spoke with son and daughter-in-law yesterday  Disposition Plan see plan above  Time spent: 30 minutes   LOS: 3 days   Job Holtsclaw M   10/22/2015, 8:23 AM

## 2015-10-22 NOTE — Progress Notes (Signed)
Subjective: She says she feels better. She seems like she's doing a little better. She is still coughing. She is still short of breath  Objective: Vital signs in last 24 hours: Temp:  [97.3 F (36.3 C)-98 F (36.7 C)] 97.3 F (36.3 C) (04/01 0514) Pulse Rate:  [70-103] 79 (04/01 0514) Resp:  [17-20] 17 (04/01 0514) BP: (116-143)/(62-94) 116/70 mmHg (04/01 0514) SpO2:  [94 %-95 %] 95 % (04/01 0754) Weight change:  Last BM Date: 10/19/15  Intake/Output from previous day: 03/31 0701 - 04/01 0700 In: 1270 [P.O.:720; IV Piggyback:550] Out: -   PHYSICAL EXAM General appearance: alert, cooperative and mild distress Resp: rhonchi bilaterally Cardio: irregularly irregular rhythm GI: soft, non-tender; bowel sounds normal; no masses,  no organomegaly Extremities: extremities normal, atraumatic, no cyanosis or edema  Lab Results:  Results for orders placed or performed during the hospital encounter of 10/19/15 (from the past 48 hour(s))  Glucose, capillary     Status: Abnormal   Collection Time: 10/20/15 12:11 PM  Result Value Ref Range   Glucose-Capillary 191 (H) 65 - 99 mg/dL   Comment 1 Notify RN   MRSA PCR Screening     Status: None   Collection Time: 10/20/15 12:15 PM  Result Value Ref Range   MRSA by PCR NEGATIVE NEGATIVE    Comment:        The GeneXpert MRSA Assay (FDA approved for NASAL specimens only), is one component of a comprehensive MRSA colonization surveillance program. It is not intended to diagnose MRSA infection nor to guide or monitor treatment for MRSA infections.   Glucose, capillary     Status: Abnormal   Collection Time: 10/20/15  4:31 PM  Result Value Ref Range   Glucose-Capillary 214 (H) 65 - 99 mg/dL   Comment 1 Notify RN    Comment 2 Document in Chart   Glucose, capillary     Status: Abnormal   Collection Time: 10/20/15  9:50 PM  Result Value Ref Range   Glucose-Capillary 165 (H) 65 - 99 mg/dL   Comment 1 Notify RN    Comment 2 Document in  Chart   Basic metabolic panel     Status: Abnormal   Collection Time: 10/21/15  6:44 AM  Result Value Ref Range   Sodium 135 135 - 145 mmol/L   Potassium 2.7 (LL) 3.5 - 5.1 mmol/L    Comment: CRITICAL RESULT CALLED TO, READ BACK BY AND VERIFIED WITH: DILDY,V AT 7:30AM ON 10/21/15 BY FESTERMAN,C    Chloride 92 (L) 101 - 111 mmol/L   CO2 35 (H) 22 - 32 mmol/L   Glucose, Bld 151 (H) 65 - 99 mg/dL   BUN 15 6 - 20 mg/dL   Creatinine, Ser 0.40 (L) 0.44 - 1.00 mg/dL   Calcium 7.6 (L) 8.9 - 10.3 mg/dL   GFR calc non Af Amer >60 >60 mL/min   GFR calc Af Amer >60 >60 mL/min    Comment: (NOTE) The eGFR has been calculated using the CKD EPI equation. This calculation has not been validated in all clinical situations. eGFR's persistently <60 mL/min signify possible Chronic Kidney Disease.    Anion gap 8 5 - 15  CBC with Differential/Platelet     Status: Abnormal   Collection Time: 10/21/15  6:44 AM  Result Value Ref Range   WBC 20.5 (H) 4.0 - 10.5 K/uL   RBC 4.21 3.87 - 5.11 MIL/uL   Hemoglobin 10.4 (L) 12.0 - 15.0 g/dL   HCT 31.7 (L) 36.0 -  46.0 %   MCV 75.3 (L) 78.0 - 100.0 fL   MCH 24.7 (L) 26.0 - 34.0 pg   MCHC 32.8 30.0 - 36.0 g/dL   RDW 16.3 (H) 11.5 - 15.5 %   Platelets 285 150 - 400 K/uL   Neutrophils Relative % 93 %   Neutro Abs 19.0 (H) 1.7 - 7.7 K/uL   Lymphocytes Relative 5 %   Lymphs Abs 1.0 0.7 - 4.0 K/uL   Monocytes Relative 2 %   Monocytes Absolute 0.4 0.1 - 1.0 K/uL   Eosinophils Relative 0 %   Eosinophils Absolute 0.0 0.0 - 0.7 K/uL   Basophils Relative 0 %   Basophils Absolute 0.0 0.0 - 0.1 K/uL  Glucose, capillary     Status: Abnormal   Collection Time: 10/21/15 12:56 PM  Result Value Ref Range   Glucose-Capillary 174 (H) 65 - 99 mg/dL  Glucose, capillary     Status: None   Collection Time: 10/21/15  4:22 PM  Result Value Ref Range   Glucose-Capillary 99 65 - 99 mg/dL  Glucose, capillary     Status: Abnormal   Collection Time: 10/21/15  8:37 PM  Result  Value Ref Range   Glucose-Capillary 140 (H) 65 - 99 mg/dL   Comment 1 Notify RN    Comment 2 Document in Chart   Basic metabolic panel     Status: Abnormal   Collection Time: 10/22/15  6:54 AM  Result Value Ref Range   Sodium 137 135 - 145 mmol/L   Potassium 3.3 (L) 3.5 - 5.1 mmol/L    Comment: DELTA CHECK NOTED   Chloride 96 (L) 101 - 111 mmol/L   CO2 33 (H) 22 - 32 mmol/L   Glucose, Bld 156 (H) 65 - 99 mg/dL   BUN 15 6 - 20 mg/dL   Creatinine, Ser 0.36 (L) 0.44 - 1.00 mg/dL   Calcium 7.6 (L) 8.9 - 10.3 mg/dL   GFR calc non Af Amer >60 >60 mL/min   GFR calc Af Amer >60 >60 mL/min    Comment: (NOTE) The eGFR has been calculated using the CKD EPI equation. This calculation has not been validated in all clinical situations. eGFR's persistently <60 mL/min signify possible Chronic Kidney Disease.    Anion gap 8 5 - 15  Glucose, capillary     Status: Abnormal   Collection Time: 10/22/15  7:56 AM  Result Value Ref Range   Glucose-Capillary 131 (H) 65 - 99 mg/dL   Comment 1 Notify RN    Comment 2 Document in Chart     ABGS No results for input(s): PHART, PO2ART, TCO2, HCO3 in the last 72 hours.  Invalid input(s): PCO2 CULTURES Recent Results (from the past 240 hour(s))  Blood culture (routine x 2)     Status: None (Preliminary result)   Collection Time: 10/19/15  8:00 PM  Result Value Ref Range Status   Specimen Description BLOOD RIGHT ARM  Final   Special Requests BOTTLES DRAWN AEROBIC AND ANAEROBIC 6CC  Final   Culture NO GROWTH 2 DAYS  Final   Report Status PENDING  Incomplete  Urine culture     Status: None (Preliminary result)   Collection Time: 10/19/15  8:05 PM  Result Value Ref Range Status   Specimen Description URINE, CLEAN CATCH  Final   Special Requests Normal  Final   Culture   Final    >=100,000 COLONIES/mL GRAM NEGATIVE RODS Performed at [facility]    Report Status PENDING  Incomplete  Blood culture (routine x 2)     Status: None (Preliminary  result)   Collection Time: 10/19/15  8:12 PM  Result Value Ref Range Status   Specimen Description BLOOD RIGHT HAND  Final   Special Requests BOTTLES DRAWN AEROBIC AND ANAEROBIC 6CC  Final   Culture NO GROWTH 2 DAYS  Final   Report Status PENDING  Incomplete  MRSA PCR Screening     Status: None   Collection Time: 10/20/15 12:15 PM  Result Value Ref Range Status   MRSA by PCR NEGATIVE NEGATIVE Final    Comment:        The GeneXpert MRSA Assay (FDA approved for NASAL specimens only), is one component of a comprehensive MRSA colonization surveillance program. It is not intended to diagnose MRSA infection nor to guide or monitor treatment for MRSA infections.    Studies/Results: No results found.  Medications:  Prior to Admission:  Prescriptions prior to admission  Medication Sig Dispense Refill Last Dose  . albuterol (PROVENTIL) (2.5 MG/3ML) 0.083% nebulizer solution Take 2.5 mg by nebulization 3 (three) times daily. *may be given every 2 hours as needed for breathing/shortness of breath   unknown  . Amino Acids-Protein Hydrolys (FEEDING SUPPLEMENT, PRO-STAT 64,) LIQD Take 30 mLs by mouth 2 (two) times daily.   10/19/2015 at [place]  . aspirin EC 81 MG tablet Take 81 mg by mouth daily.   10/19/2015 at [place]  . clopidogrel (PLAVIX) 75 MG tablet Take 1 tablet (75 mg total) by mouth daily with breakfast. 30 tablet 3 10/19/2015 at [place]  . clotrimazole (LOTRIMIN) 1 % cream Apply 1 application topically 4 (four) times daily as needed (to corners of mouth/lips as needed).   unknown  . digoxin (LANOXIN) 0.125 MG tablet Take 1 tablet (0.125 mg total) by mouth daily. 30 tablet 3 10/19/2015 at [facility]  . diltiazem (CARDIZEM) 30 MG tablet Take 1 tablet (30 mg total) by mouth 4 (four) times daily. 120 tablet 3 10/19/2015 at Unknown time  . fluticasone (FLOVENT HFA) 44 MCG/ACT inhaler Inhale 2 puffs into the lungs 2 (two) times daily.   10/19/2015 at [place]  . furosemide (LASIX) 20 MG tablet Take 20 mg by  mouth daily.    10/19/2015 at [facility]  . guaiFENesin (MUCINEX) 600 MG 12 hr tablet Take 1 tablet (600 mg total) by mouth 2 (two) times daily. 14 tablet 0 10/19/2015 at [place]  . HYDROcodone-acetaminophen (NORCO) 5-325 MG tablet Take one tablet by mouth every 6 hours as needed for pain. Max APAP 3gm/24hrs from all sources 120 tablet 0 unknown  . ipratropium-albuterol (DUONEB) 0.5-2.5 (3) MG/3ML SOLN Take 3 mLs by nebulization 2 (two) times daily. (Patient taking differently: Take 3 mLs by nebulization 3 (three) times daily. ) 360 mL 3 10/19/2015 at Unknown time  . Melatonin 3 MG TABS Take 3 mg by mouth at bedtime.   10/18/2015 at [facility]  . metFORMIN (GLUCOPHAGE) 500 MG tablet Take 500 mg by mouth 2 (two) times daily with a meal.   10/19/2015 at [facility]  . nystatin (MYCOSTATIN) 100000 UNIT/ML suspension Take 5 mLs by mouth 4 (four) times daily.   10/19/2015 at Unknown time  . OXYGEN Inhale 2 L into the lungs daily.   10/19/2015 at Unknown time  . potassium chloride (K-DUR,KLOR-CON) 10 MEQ tablet Take 10 mEq by mouth daily.    10/19/2015 at [facility]  . pravastatin (PRAVACHOL) 40 MG tablet Take 2 tablets (80 mg total) by mouth every evening. 30 tablet 0  10/18/2015 at 2100  . saccharomyces boulardii (FLORASTOR) 250 MG capsule Take 250 mg by mouth 2 (two) times daily.   10/19/2015 at [place]  . senna-docusate (SENOKOT-S) 8.6-50 MG tablet Take 1 tablet by mouth at bedtime as needed for mild constipation. 20 tablet 0 unknown  . sertraline (ZOLOFT) 50 MG tablet Take 50 mg by mouth every morning.   10/19/2015 at [place]  . traZODone (DESYREL) 50 MG tablet Take 1 tablet (50 mg total) by mouth at bedtime as needed for sleep.   unknown  . trypsin-balsam-castor oil (XENADERM) ointment Apply 1 application topically daily. **Apply to coccyx, bilateral buttocls, and saccrum at every shift and as needed**   10/19/2015 at Unknown time   Scheduled: . aspirin EC  81 mg Oral Daily  . ceFEPime (MAXIPIME) IV  1 g Intravenous 3 times per day  . digoxin   0.125 mg Oral Daily  . diltiazem  30 mg Oral QID  . enoxaparin (LOVENOX) injection  40 mg Subcutaneous Q24H  . furosemide  20 mg Intravenous Q12H  . guaiFENesin  600 mg Oral BID  . insulin aspart  0-9 Units Subcutaneous TID WC  . ipratropium  0.5 mg Nebulization TID  . levalbuterol  0.63 mg Nebulization TID  . methylPREDNISolone (SOLU-MEDROL) injection  60 mg Intravenous Q6H  . potassium chloride  20 mEq Oral Daily  . potassium chloride  10 mEq Oral BID  . vancomycin  500 mg Intravenous Q12H   Continuous: . sodium chloride     WVP:[id number], ondansetron **OR** ondansetron (ZOFRAN) IV, RESOURCE THICKENUP CLEAR, traZODone  Assesment: She was admitted with healthcare associated pneumonia. At baseline she has severe COPD. She does seem to be improving. Active Problems:   Chronic systolic CHF (congestive heart failure) (HCC)   COPD (chronic obstructive pulmonary disease) (HCC)   A-fib (HCC)   HCAP (healthcare-associated pneumonia)   Pressure ulcer    Plan: I would plan 5 days of IV antibiotics and transitioned to oral after that    LOS: 3 days   Maico Mulvehill L 10/22/2015, 9:49 AM

## 2015-10-23 LAB — GLUCOSE, CAPILLARY
GLUCOSE-CAPILLARY: 165 mg/dL — AB (ref 65–99)
GLUCOSE-CAPILLARY: 209 mg/dL — AB (ref 65–99)
Glucose-Capillary: 192 mg/dL — ABNORMAL HIGH (ref 65–99)
Glucose-Capillary: 202 mg/dL — ABNORMAL HIGH (ref 65–99)
Glucose-Capillary: 163 mg/dL — ABNORMAL HIGH (ref 65–99)

## 2015-10-23 LAB — BASIC METABOLIC PANEL
Anion gap: 7 (ref 5–15)
BUN: 18 mg/dL (ref 6–20)
CALCIUM: 7.4 mg/dL — AB (ref 8.9–10.3)
CO2: 32 mmol/L (ref 22–32)
CREATININE: 0.44 mg/dL (ref 0.44–1.00)
Chloride: 96 mmol/L — ABNORMAL LOW (ref 101–111)
GFR calc non Af Amer: >60 mL/min (ref >60)
Glucose, Bld: 182 mg/dL — ABNORMAL HIGH (ref 65–99)
Potassium: 3.1 mmol/L — ABNORMAL LOW (ref 3.5–5.1)
SODIUM: 135 mmol/L (ref 135–145)

## 2015-10-23 LAB — CBC WITH DIFFERENTIAL/PLATELET
BASOS PCT: 0 %
Basophils Absolute: 0 10*3/uL (ref 0.0–0.1)
EOS PCT: 0 %
Eosinophils Absolute: 0 10*3/uL (ref 0.0–0.7)
HCT: 33 % — ABNORMAL LOW (ref 36.0–46.0)
HEMOGLOBIN: 10.8 g/dL — AB (ref 12.0–15.0)
Lymphocytes Relative: 5 %
Lymphs Abs: 0.7 10*3/uL (ref 0.7–4.0)
MCH: 24.8 pg — ABNORMAL LOW (ref 26.0–34.0)
MCHC: 32.7 g/dL (ref 30.0–36.0)
MCV: 75.9 fL — ABNORMAL LOW (ref 78.0–100.0)
MONOS PCT: 1 %
Monocytes Absolute: 0.2 10*3/uL (ref 0.1–1.0)
NEUTROS PCT: 94 %
Neutro Abs: 14.1 10*3/uL — ABNORMAL HIGH (ref 1.7–7.7)
PLATELETS: 240 10*3/uL (ref 150–400)
RBC: 4.35 MIL/uL (ref 3.87–5.11)
RDW: 16.3 % — AB (ref 11.5–15.5)
WBC: 15 10*3/uL — AB (ref 4.0–10.5)

## 2015-10-23 MED ORDER — POTASSIUM CHLORIDE CRYS ER 20 MEQ PO TBCR
20.0000 meq | EXTENDED_RELEASE_TABLET | Freq: Two times a day (BID) | ORAL | Status: DC
  Administered 2015-10-23 – 2015-10-24 (×3): 20 meq via ORAL
  Filled 2015-10-23 (×3): qty 1

## 2015-10-23 MED ORDER — POTASSIUM CHLORIDE CRYS ER 10 MEQ PO TBCR: 10.0000 meq | EXTENDED_RELEASE_TABLET | Freq: Two times a day (BID) | ORAL | Status: DC

## 2015-10-23 NOTE — Progress Notes (Signed)
Pharmacy Antibiotic Note  Tara GuyJane F Park is a 70 y.o. female admitted on 10/19/2015 with pneumonia.  Pharmacy has been consulted for Vancomycin and Cefepime dosing.  Plan: Vancomycin 500mg  IV every 12 hr Cefepime 1gm IV every 8 hourHeight: 5\' 6"  (167.6 cm) Weight: 151 lb 14.4 oz (68.9 kg) IBW/kg (Calculated) : 59.3  Temp (24hrs), Avg:98.5 F (36.9 C), Min:98.4 F (36.9 C), Max:98.5 F (36.9 C)   Recent Labs Lab 10/19/15 1829 10/20/15 0429 10/21/15 0644 10/22/15 0654 10/23/15 0640  WBC 32.7* 16.4* 20.5*  --   --   CREATININE 0.47 0.46 0.40* 0.36* 0.44    Estimated Creatinine Clearance: 60.4 mL/min (by C-G formula based on Cr of 0.44).    No Known Allergies  Antimicrobials this admission: Vancomycin 3/29 >>  Cefepime 3/29 >>   Microbiology results: 3/29  BCx: pending 3/29 UCx: E coli 2/5 MRSA PCR: negative  Thank you for allowing pharmacy to be a part of this patient's care.  Blanchie DessertLora Zakara Parkey Pharm D Clinical Pharmacist 10/23/2015 10:03 AM

## 2015-10-23 NOTE — Progress Notes (Signed)
Subjective: Tara Park says Tara Park feels a little better. Tara Park still coughing.  Objective: Vital signs in last 24 hours: Temp:  [98.4 F (36.9 C)-98.5 F (36.9 C)] 98.4 F (36.9 C) (04/02 2979) Pulse Rate:  [84-100] 85 (04/02 0613) Resp:  [18-20] 20 (04/02 0613) BP: (108-119)/(61-73) 108/61 mmHg (04/02 0613) SpO2:  [93 %-98 %] 93 % (04/02 0755) Weight change:  Last BM Date: 10/21/15  Intake/Output from previous day: 04/01 0701 - 04/02 0700 In: 680 [P.O.:680] Out: -   PHYSICAL EXAM General appearance: alert and Chronically sick Resp: rhonchi bilaterally Cardio: irregularly irregular rhythm GI: soft, non-tender; bowel sounds normal; no masses,  no organomegaly Extremities: extremities normal, atraumatic, no cyanosis or edema  Lab Results:  Results for orders placed or performed during the hospital encounter of 10/19/15 (from the past 48 hour(s))  Glucose, capillary     Status: Abnormal   Collection Time: 10/21/15 12:56 PM  Result Value Ref Range   Glucose-Capillary 174 (H) 65 - 99 mg/dL  Glucose, capillary     Status: None   Collection Time: 10/21/15  4:22 PM  Result Value Ref Range   Glucose-Capillary 99 65 - 99 mg/dL  Glucose, capillary     Status: Abnormal   Collection Time: 10/21/15  8:37 PM  Result Value Ref Range   Glucose-Capillary 140 (H) 65 - 99 mg/dL   Comment 1 Notify RN    Comment 2 Document in Chart   Basic metabolic panel     Status: Abnormal   Collection Time: 10/22/15  6:54 AM  Result Value Ref Range   Sodium 137 135 - 145 mmol/L   Potassium 3.3 (L) 3.5 - 5.1 mmol/L    Comment: DELTA CHECK NOTED   Chloride 96 (L) 101 - 111 mmol/L   CO2 33 (H) 22 - 32 mmol/L   Glucose, Bld 156 (H) 65 - 99 mg/dL   BUN 15 6 - 20 mg/dL   Creatinine, Ser 0.36 (L) 0.44 - 1.00 mg/dL   Calcium 7.6 (L) 8.9 - 10.3 mg/dL   GFR calc non Af Amer >60 >60 mL/min   GFR calc Af Amer >60 >60 mL/min    Comment: (NOTE) The eGFR has been calculated using the CKD EPI equation. This  calculation has not been validated in all clinical situations. eGFR's persistently <60 mL/min signify possible Chronic Kidney Disease.    Anion gap 8 5 - 15  Glucose, capillary     Status: Abnormal   Collection Time: 10/22/15  7:56 AM  Result Value Ref Range   Glucose-Capillary 131 (H) 65 - 99 mg/dL   Comment 1 Notify RN    Comment 2 Document in Chart   Glucose, capillary     Status: Abnormal   Collection Time: 10/22/15 12:09 PM  Result Value Ref Range   Glucose-Capillary 207 (H) 65 - 99 mg/dL   Comment 1 Notify RN    Comment 2 Document in Chart   Glucose, capillary     Status: Abnormal   Collection Time: 10/22/15  4:49 PM  Result Value Ref Range   Glucose-Capillary 200 (H) 65 - 99 mg/dL  Glucose, capillary     Status: Abnormal   Collection Time: 10/22/15 10:41 PM  Result Value Ref Range   Glucose-Capillary 190 (H) 65 - 99 mg/dL   Comment 1 Notify RN    Comment 2 Document in Chart   Basic metabolic panel     Status: Abnormal   Collection Time: 10/23/15  6:40 AM  Result Value  Ref Range   Sodium 135 135 - 145 mmol/L   Potassium 3.1 (L) 3.5 - 5.1 mmol/L   Chloride 96 (L) 101 - 111 mmol/L   CO2 32 22 - 32 mmol/L   Glucose, Bld 182 (H) 65 - 99 mg/dL   BUN 18 6 - 20 mg/dL   Creatinine, Ser 0.44 0.44 - 1.00 mg/dL   Calcium 7.4 (L) 8.9 - 10.3 mg/dL   GFR calc non Af Amer >60 >60 mL/min   GFR calc Af Amer >60 >60 mL/min    Comment: (NOTE) The eGFR has been calculated using the CKD EPI equation. This calculation has not been validated in all clinical situations. eGFR's persistently <60 mL/min signify possible Chronic Kidney Disease.    Anion gap 7 5 - 15  Glucose, capillary     Status: Abnormal   Collection Time: 10/23/15  7:40 AM  Result Value Ref Range   Glucose-Capillary 165 (H) 65 - 99 mg/dL    ABGS No results for input(s): PHART, PO2ART, TCO2, HCO3 in the last 72 hours.  Invalid input(s): PCO2 CULTURES Recent Results (from the past 240 hour(s))  Blood culture  (routine x 2)     Status: None (Preliminary result)   Collection Time: 10/19/15  8:00 PM  Result Value Ref Range Status   Specimen Description BLOOD RIGHT ARM  Final   Special Requests BOTTLES DRAWN AEROBIC AND ANAEROBIC 6CC  Final   Culture NO GROWTH 3 DAYS  Final   Report Status PENDING  Incomplete  Urine culture     Status: None   Collection Time: 10/19/15  8:05 PM  Result Value Ref Range Status   Specimen Description URINE, CLEAN CATCH  Final   Special Requests Normal  Final   Culture   Final    >=100,000 COLONIES/mL ESCHERICHIA COLI Performed at [facility]    Report Status 10/22/2015 FINAL  Final   Organism ID, Bacteria ESCHERICHIA COLI  Final      Susceptibility   Escherichia coli - MIC*    AMPICILLIN >=32 RESISTANT Resistant     CEFAZOLIN <=4 SENSITIVE Sensitive     CEFTRIAXONE <=1 SENSITIVE Sensitive     CIPROFLOXACIN 0.5 SENSITIVE Sensitive     GENTAMICIN <=1 SENSITIVE Sensitive     IMIPENEM <=0.25 SENSITIVE Sensitive     NITROFURANTOIN <=16 SENSITIVE Sensitive     TRIMETH/SULFA 160 RESISTANT Resistant     AMPICILLIN/SULBACTAM >=32 RESISTANT Resistant     PIP/TAZO <=4 SENSITIVE Sensitive     * >=100,000 COLONIES/mL ESCHERICHIA COLI  Blood culture (routine x 2)     Status: None (Preliminary result)   Collection Time: 10/19/15  8:12 PM  Result Value Ref Range Status   Specimen Description BLOOD RIGHT HAND  Final   Special Requests BOTTLES DRAWN AEROBIC AND ANAEROBIC 6CC  Final   Culture NO GROWTH 3 DAYS  Final   Report Status PENDING  Incomplete  MRSA PCR Screening     Status: None   Collection Time: 10/20/15 12:15 PM  Result Value Ref Range Status   MRSA by PCR NEGATIVE NEGATIVE Final    Comment:        The GeneXpert MRSA Assay (FDA approved for NASAL specimens only), is one component of a comprehensive MRSA colonization surveillance program. It is not intended to diagnose MRSA infection nor to guide or monitor treatment for MRSA infections.     Studies/Results: No results found.  Medications:  Prior to Admission:  Prescriptions prior to admission  Medication  Sig Dispense Refill Last Dose  . albuterol (PROVENTIL) (2.5 MG/3ML) 0.083% nebulizer solution Take 2.5 mg by nebulization 3 (three) times daily. *may be given every 2 hours as needed for breathing/shortness of breath   unknown  . Amino Acids-Protein Hydrolys (FEEDING SUPPLEMENT, PRO-STAT 64,) LIQD Take 30 mLs by mouth 2 (two) times daily.   10/19/2015 at [place]  . aspirin EC 81 MG tablet Take 81 mg by mouth daily.   10/19/2015 at [place]  . clopidogrel (PLAVIX) 75 MG tablet Take 1 tablet (75 mg total) by mouth daily with breakfast. 30 tablet 3 10/19/2015 at [place]  . clotrimazole (LOTRIMIN) 1 % cream Apply 1 application topically 4 (four) times daily as needed (to corners of mouth/lips as needed).   unknown  . digoxin (LANOXIN) 0.125 MG tablet Take 1 tablet (0.125 mg total) by mouth daily. 30 tablet 3 10/19/2015 at [facility]  . diltiazem (CARDIZEM) 30 MG tablet Take 1 tablet (30 mg total) by mouth 4 (four) times daily. 120 tablet 3 10/19/2015 at Unknown time  . fluticasone (FLOVENT HFA) 44 MCG/ACT inhaler Inhale 2 puffs into the lungs 2 (two) times daily.   10/19/2015 at [place]  . furosemide (LASIX) 20 MG tablet Take 20 mg by mouth daily.    10/19/2015 at [facility]  . guaiFENesin (MUCINEX) 600 MG 12 hr tablet Take 1 tablet (600 mg total) by mouth 2 (two) times daily. 14 tablet 0 10/19/2015 at [place]  . HYDROcodone-acetaminophen (NORCO) 5-325 MG tablet Take one tablet by mouth every 6 hours as needed for pain. Max APAP 3gm/24hrs from all sources 120 tablet 0 unknown  . ipratropium-albuterol (DUONEB) 0.5-2.5 (3) MG/3ML SOLN Take 3 mLs by nebulization 2 (two) times daily. (Patient taking differently: Take 3 mLs by nebulization 3 (three) times daily. ) 360 mL 3 10/19/2015 at Unknown time  . Melatonin 3 MG TABS Take 3 mg by mouth at bedtime.   10/18/2015 at 2100  . metFORMIN (GLUCOPHAGE) 500 MG tablet Take 500 mg  by mouth 2 (two) times daily with a meal.   10/19/2015 at [facility]  . nystatin (MYCOSTATIN) 100000 UNIT/ML suspension Take 5 mLs by mouth 4 (four) times daily.   10/19/2015 at Unknown time  . OXYGEN Inhale 2 L into the lungs daily.   10/19/2015 at Unknown time  . potassium chloride (K-DUR,KLOR-CON) 10 MEQ tablet Take 10 mEq by mouth daily.    10/19/2015 at [facility]  . pravastatin (PRAVACHOL) 40 MG tablet Take 2 tablets (80 mg total) by mouth every evening. 30 tablet 0 10/18/2015 at 2100  . saccharomyces boulardii (FLORASTOR) 250 MG capsule Take 250 mg by mouth 2 (two) times daily.   10/19/2015 at [place]  . senna-docusate (SENOKOT-S) 8.6-50 MG tablet Take 1 tablet by mouth at bedtime as needed for mild constipation. 20 tablet 0 unknown  . sertraline (ZOLOFT) 50 MG tablet Take 50 mg by mouth every morning.   10/19/2015 at [place]  . traZODone (DESYREL) 50 MG tablet Take 1 tablet (50 mg total) by mouth at bedtime as needed for sleep.   unknown  . trypsin-balsam-castor oil (XENADERM) ointment Apply 1 application topically daily. **Apply to coccyx, bilateral buttocls, and saccrum at every shift and as needed**   10/19/2015 at Unknown time   Scheduled: . aspirin EC  81 mg Oral Daily  . ceFEPime (MAXIPIME) IV  1 g Intravenous 3 times per day  . digoxin  0.125 mg Oral Daily  . diltiazem  30 mg Oral QID  . enoxaparin (LOVENOX) injection  40 mg Subcutaneous Q24H  . furosemide  20 mg Intravenous Q12H  . guaiFENesin  600 mg Oral BID  . insulin aspart  0-9 Units Subcutaneous TID WC  . ipratropium  0.5 mg Nebulization TID  . levalbuterol  0.63 mg Nebulization TID  . methylPREDNISolone (SOLU-MEDROL) injection  60 mg Intravenous Q6H  . potassium chloride  10 mEq Oral BID  . vancomycin  500 mg Intravenous Q12H   Continuous: . sodium chloride     XEN:[id number], ondansetron **OR** ondansetron (ZOFRAN) IV, RESOURCE THICKENUP CLEAR, traZODone  Assesment: Tara Park was admitted with healthcare associated pneumonia  COPD exacerbation and chronic heart failure. Tara Park is getting better. Her chest is clearer than when I first heard her. Tara Park has multiple other medical problems which are stable Active Problems:   Chronic systolic CHF (congestive heart failure) (HCC)   COPD (chronic obstructive pulmonary disease) (HCC)   A-fib (HCC)   HCAP (healthcare-associated pneumonia)   Pressure ulcer    Plan: Continue current treatments    LOS: 4 days   Sherina Stammer L 10/23/2015, 8:18 AM

## 2015-10-23 NOTE — Progress Notes (Signed)
Has less respiratory distress than several days ago potassium 3.2 Will augment K Dur to 20 mEq by mouth twice a day currently on Maxipime and vancomycin for H CAP likewise on Solu-Medrol and DuoNeb nebulizer C addition of Mucinex Tara Park [name] DOB: 09/14/1945 DOA: 10/19/2015 PCP: Isabella Stalling, MD             Physical Exam: Blood pressure 108/61, pulse 85, temperature 98.4 F (36.9 C), temperature source Oral, resp. rate 20, height  (1.676 m), weight 151 lb 14.4 oz (68.9 kg), SpO2 93 %. lungs show diminished breath sounds in the bases scattered rhonchi mild end expiratory wheeze symptomatically improving no rales audible heart regular no S3-S4 no heaves thrills or rubs abdomen soft nontender bowel sounds normoactive   Investigations:  Recent Results (from the past 240 hour(s))  Blood culture (routine x 2)     Status: None (Preliminary result)   Collection Time: 10/19/15  8:00 PM  Result Value Ref Range Status   Specimen Description BLOOD RIGHT ARM  Final   Special Requests BOTTLES DRAWN AEROBIC AND ANAEROBIC 6CC  Final   Culture NO GROWTH 4 DAYS  Final   Report Status PENDING  Incomplete  Urine culture     Status: None   Collection Time: 10/19/15  8:05 PM  Result Value Ref Range Status   Specimen Description URINE, CLEAN CATCH  Final   Special Requests Normal  Final   Culture   Final    >=100,000 COLONIES/mL ESCHERICHIA COLI Performed at [facility]    Report Status 10/22/2015 FINAL  Final   Organism ID, Bacteria ESCHERICHIA COLI  Final      Susceptibility   Escherichia coli - MIC*    AMPICILLIN >=32 RESISTANT Resistant     CEFAZOLIN <=4 SENSITIVE Sensitive     CEFTRIAXONE <=1 SENSITIVE Sensitive     CIPROFLOXACIN 0.5 SENSITIVE Sensitive     GENTAMICIN <=1 SENSITIVE Sensitive     IMIPENEM <=0.25 SENSITIVE Sensitive     NITROFURANTOIN <=16 SENSITIVE Sensitive     TRIMETH/SULFA 160 RESISTANT Resistant     AMPICILLIN/SULBACTAM >=32  RESISTANT Resistant     PIP/TAZO <=4 SENSITIVE Sensitive     * >=100,000 COLONIES/mL ESCHERICHIA COLI  Blood culture (routine x 2)     Status: None (Preliminary result)   Collection Time: 10/19/15  8:12 PM  Result Value Ref Range Status   Specimen Description BLOOD RIGHT HAND  Final   Special Requests BOTTLES DRAWN AEROBIC AND ANAEROBIC 6CC  Final   Culture NO GROWTH 4 DAYS  Final   Report Status PENDING  Incomplete  MRSA PCR Screening     Status: None   Collection Time: 10/20/15 12:15 PM  Result Value Ref Range Status   MRSA by PCR NEGATIVE NEGATIVE Final    Comment:        The GeneXpert MRSA Assay (FDA approved for NASAL specimens only), is one component of a comprehensive MRSA colonization surveillance program. It is not intended to diagnose MRSA infection nor to guide or monitor treatment for MRSA infections.      Basic Metabolic Panel:  Recent Labs  95/62/13 0654 10/23/15 0640  NA 137 135  K 3.3* 3.1*  CL 96* 96*  CO2 33* 32  GLUCOSE 156* 182*  BUN 15 18  CREATININE 0.36* 0.44  CALCIUM 7.6* 7.4*   Liver Function Tests: No results for input(s): AST, ALT, ALKPHOS, BILITOT, PROT, ALBUMIN in the last 72 hours.   CBC:  Recent  Labs  10/21/15 0644  WBC 20.5*  NEUTROABS 19.0*  HGB 10.4*  HCT 31.7*  MCV 75.3*  PLT 285    No results found.    Medications:   Impression: Hypokalemia  Active Problems:   Chronic systolic CHF (congestive heart failure) (HCC)   COPD (chronic obstructive pulmonary disease) (HCC)   A-fib (HCC)   HCAP (healthcare-associated pneumonia)   Pressure ulcer     Plan: K Dur 20 milk oh pounds by mouth twice a day monitor potassium and CBC in a.m.   Consultants: Pulmonary   Procedures   Antibiotics: Vancomycin and Maxipime                  Code Status: Full   Family Communication:  [place] with son 48 hours ago  Disposition Plan see plan above  Time spent: 30 minutes   LOS: 4 days   Rilley Stash  M   10/23/2015, 10:45 AM

## 2015-10-24 LAB — GLUCOSE, CAPILLARY
GLUCOSE-CAPILLARY: 167 mg/dL — AB (ref 65–99)
GLUCOSE-CAPILLARY: 167 mg/dL — AB (ref 65–99)
Glucose-Capillary: 162 mg/dL — ABNORMAL HIGH (ref 65–99)
Glucose-Capillary: 193 mg/dL — ABNORMAL HIGH (ref 65–99)

## 2015-10-24 LAB — CBC WITH DIFFERENTIAL/PLATELET
BASOS PCT: 0 %
Basophils Absolute: 0 10*3/uL (ref 0.0–0.1)
EOS ABS: 0 10*3/uL (ref 0.0–0.7)
Eosinophils Relative: 0 %
HEMATOCRIT: 34.3 % — AB (ref 36.0–46.0)
Hemoglobin: 11.2 g/dL — ABNORMAL LOW (ref 12.0–15.0)
LYMPHS ABS: 0.4 10*3/uL — AB (ref 0.7–4.0)
Lymphocytes Relative: 3 %
MCH: 24.5 pg — AB (ref 26.0–34.0)
MCHC: 32.7 g/dL (ref 30.0–36.0)
MCV: 75.1 fL — ABNORMAL LOW (ref 78.0–100.0)
MONO ABS: 0.5 10*3/uL (ref 0.1–1.0)
MONOS PCT: 4 %
Neutro Abs: 12.4 10*3/uL — ABNORMAL HIGH (ref 1.7–7.7)
Neutrophils Relative %: 93 %
Platelets: 237 10*3/uL (ref 150–400)
RBC: 4.57 MIL/uL (ref 3.87–5.11)
RDW: 16.4 % — AB (ref 11.5–15.5)
WBC: 13.4 10*3/uL — ABNORMAL HIGH (ref 4.0–10.5)

## 2015-10-24 LAB — CULTURE, BLOOD (ROUTINE X 2)
CULTURE: NO GROWTH
Culture: NO GROWTH

## 2015-10-24 LAB — BASIC METABOLIC PANEL
Anion gap: 9 (ref 5–15)
BUN: 20 mg/dL (ref 6–20)
CALCIUM: 7.8 mg/dL — AB (ref 8.9–10.3)
CO2: 32 mmol/L (ref 22–32)
Chloride: 96 mmol/L — ABNORMAL LOW (ref 101–111)
Creatinine, Ser: 0.46 mg/dL (ref 0.44–1.00)
GFR calc Af Amer: >60 mL/min (ref >60)
GLUCOSE: 168 mg/dL — AB (ref 65–99)
Potassium: 2.7 mmol/L — CL (ref 3.5–5.1)
Sodium: 137 mmol/L (ref 135–145)

## 2015-10-24 LAB — VANCOMYCIN, TROUGH: Vancomycin Tr: 16 ug/mL (ref 10.0–20.0)

## 2015-10-24 MED ORDER — POTASSIUM CHLORIDE 10 MEQ/100ML IV SOLN
10.0000 meq | INTRAVENOUS | Status: AC
  Administered 2015-10-24 (×4): 10 meq via INTRAVENOUS
  Filled 2015-10-24: qty 100

## 2015-10-24 MED ORDER — POTASSIUM CHLORIDE CRYS ER 20 MEQ PO TBCR
20.0000 meq | EXTENDED_RELEASE_TABLET | Freq: Three times a day (TID) | ORAL | Status: DC
  Administered 2015-10-24 – 2015-10-27 (×9): 20 meq via ORAL
  Filled 2015-10-24 (×9): qty 1

## 2015-10-24 MED ORDER — FUROSEMIDE 10 MG/ML IJ SOLN
20.0000 mg | Freq: Every day | INTRAMUSCULAR | Status: DC
Start: 2015-10-25 — End: 2015-10-27
  Administered 2015-10-25 – 2015-10-27 (×3): 20 mg via INTRAVENOUS
  Filled 2015-10-24 (×3): qty 2

## 2015-10-24 MED ORDER — METHYLPREDNISOLONE SODIUM SUCC 125 MG IJ SOLR
80.0000 mg | Freq: Four times a day (QID) | INTRAMUSCULAR | Status: DC
  Administered 2015-10-24 – 2015-10-25 (×4): 80 mg via INTRAVENOUS
  Filled 2015-10-24 (×4): qty 2

## 2015-10-24 NOTE — Progress Notes (Signed)
Subjective: She seems to be feeling a little better. She is still coughing. She is not coughing anything up.  Objective: Vital signs in last 24 hours: Temp:  [97.6 F (36.4 C)-98.3 F (36.8 C)] 98.3 F (36.8 C) (04/03 0500) Pulse Rate:  [98] 98 (04/02 1405) Resp:  [20] 20 (04/02 1405) BP: (116-134)/(59-65) 122/60 mmHg (04/03 0500) SpO2:  [92 %-95 %] 94 % (04/03 0820) Weight change:  Last BM Date: 10/22/15  Intake/Output from previous day: 04/02 0701 - 04/03 0700 In: 720 [P.O.:720] Out: -   PHYSICAL EXAM General appearance: alert, cooperative and mild distress Resp: rhonchi bilaterally Cardio: irregularly irregular rhythm GI: soft, non-tender; bowel sounds normal; no masses,  no organomegaly Extremities: extremities normal, atraumatic, no cyanosis or edema  Lab Results:  Results for orders placed or performed during the hospital encounter of 10/19/15 (from the past 48 hour(s))  Glucose, capillary     Status: Abnormal   Collection Time: 10/22/15 12:09 PM  Result Value Ref Range   Glucose-Capillary 207 (H) 65 - 99 mg/dL   Comment 1 Notify RN    Comment 2 Document in Chart   Glucose, capillary     Status: Abnormal   Collection Time: 10/22/15  4:49 PM  Result Value Ref Range   Glucose-Capillary 200 (H) 65 - 99 mg/dL  Glucose, capillary     Status: Abnormal   Collection Time: 10/22/15 10:41 PM  Result Value Ref Range   Glucose-Capillary 190 (H) 65 - 99 mg/dL   Comment 1 Notify RN    Comment 2 Document in Chart   Basic metabolic panel     Status: Abnormal   Collection Time: 10/23/15  6:40 AM  Result Value Ref Range   Sodium 135 135 - 145 mmol/L   Potassium 3.1 (L) 3.5 - 5.1 mmol/L   Chloride 96 (L) 101 - 111 mmol/L   CO2 32 22 - 32 mmol/L   Glucose, Bld 182 (H) 65 - 99 mg/dL   BUN 18 6 - 20 mg/dL   Creatinine, Ser 0.44 0.44 - 1.00 mg/dL   Calcium 7.4 (L) 8.9 - 10.3 mg/dL   GFR calc non Af Amer >60 >60 mL/min   GFR calc Af Amer >60 >60 mL/min    Comment:  (NOTE) The eGFR has been calculated using the CKD EPI equation. This calculation has not been validated in all clinical situations. eGFR's persistently <60 mL/min signify possible Chronic Kidney Disease.    Anion gap 7 5 - 15  Glucose, capillary     Status: Abnormal   Collection Time: 10/23/15  7:40 AM  Result Value Ref Range   Glucose-Capillary 165 (H) 65 - 99 mg/dL  Glucose, capillary     Status: Abnormal   Collection Time: 10/23/15 11:26 AM  Result Value Ref Range   Glucose-Capillary 209 (H) 65 - 99 mg/dL  CBC with Differential/Platelet     Status: Abnormal   Collection Time: 10/23/15 12:40 PM  Result Value Ref Range   WBC 15.0 (H) 4.0 - 10.5 K/uL   RBC 4.35 3.87 - 5.11 MIL/uL   Hemoglobin 10.8 (L) 12.0 - 15.0 g/dL   HCT 33.0 (L) 36.0 - 46.0 %   MCV 75.9 (L) 78.0 - 100.0 fL   MCH 24.8 (L) 26.0 - 34.0 pg   MCHC 32.7 30.0 - 36.0 g/dL   RDW 16.3 (H) 11.5 - 15.5 %   Platelets 240 150 - 400 K/uL   Neutrophils Relative % 94 %   Neutro Abs 14.1 (  H) 1.7 - 7.7 K/uL   Lymphocytes Relative 5 %   Lymphs Abs 0.7 0.7 - 4.0 K/uL   Monocytes Relative 1 %   Monocytes Absolute 0.2 0.1 - 1.0 K/uL   Eosinophils Relative 0 %   Eosinophils Absolute 0.0 0.0 - 0.7 K/uL   Basophils Relative 0 %   Basophils Absolute 0.0 0.0 - 0.1 K/uL  Glucose, capillary     Status: Abnormal   Collection Time: 10/23/15  4:31 PM  Result Value Ref Range   Glucose-Capillary 192 (H) 65 - 99 mg/dL  Glucose, capillary     Status: Abnormal   Collection Time: 10/23/15  5:07 PM  Result Value Ref Range   Glucose-Capillary 202 (H) 65 - 99 mg/dL  Glucose, capillary     Status: Abnormal   Collection Time: 10/23/15  9:55 PM  Result Value Ref Range   Glucose-Capillary 163 (H) 65 - 99 mg/dL   Comment 1 Notify RN    Comment 2 Document in Chart   Basic metabolic panel     Status: Abnormal   Collection Time: 10/24/15  4:57 AM  Result Value Ref Range   Sodium 137 135 - 145 mmol/L   Potassium 2.7 (LL) 3.5 - 5.1 mmol/L     Comment: CRITICAL RESULT CALLED TO, READ BACK BY AND VERIFIED WITH: WILSON,A AT 6:25AM ON 10/24/15 BY FESTERMAN,C    Chloride 96 (L) 101 - 111 mmol/L   CO2 32 22 - 32 mmol/L   Glucose, Bld 168 (H) 65 - 99 mg/dL   BUN 20 6 - 20 mg/dL   Creatinine, Ser 0.46 0.44 - 1.00 mg/dL   Calcium 7.8 (L) 8.9 - 10.3 mg/dL   GFR calc non Af Amer >60 >60 mL/min   GFR calc Af Amer >60 >60 mL/min    Comment: (NOTE) The eGFR has been calculated using the CKD EPI equation. This calculation has not been validated in all clinical situations. eGFR's persistently <60 mL/min signify possible Chronic Kidney Disease.    Anion gap 9 5 - 15  Vancomycin, trough     Status: None   Collection Time: 10/24/15  4:57 AM  Result Value Ref Range   Vancomycin Tr 16 10.0 - 20.0 ug/mL  CBC with Differential/Platelet     Status: Abnormal   Collection Time: 10/24/15  4:57 AM  Result Value Ref Range   WBC 13.4 (H) 4.0 - 10.5 K/uL   RBC 4.57 3.87 - 5.11 MIL/uL   Hemoglobin 11.2 (L) 12.0 - 15.0 g/dL   HCT 34.3 (L) 36.0 - 46.0 %   MCV 75.1 (L) 78.0 - 100.0 fL   MCH 24.5 (L) 26.0 - 34.0 pg   MCHC 32.7 30.0 - 36.0 g/dL   RDW 16.4 (H) 11.5 - 15.5 %   Platelets 237 150 - 400 K/uL   Neutrophils Relative % 93 %   Neutro Abs 12.4 (H) 1.7 - 7.7 K/uL   Lymphocytes Relative 3 %   Lymphs Abs 0.4 (L) 0.7 - 4.0 K/uL   Monocytes Relative 4 %   Monocytes Absolute 0.5 0.1 - 1.0 K/uL   Eosinophils Relative 0 %   Eosinophils Absolute 0.0 0.0 - 0.7 K/uL   Basophils Relative 0 %   Basophils Absolute 0.0 0.0 - 0.1 K/uL    ABGS No results for input(s): PHART, PO2ART, TCO2, HCO3 in the last 72 hours.  Invalid input(s): PCO2 CULTURES Recent Results (from the past 240 hour(s))  Blood culture (routine x 2)  Status: None (Preliminary result)   Collection Time: 10/19/15  8:00 PM  Result Value Ref Range Status   Specimen Description BLOOD RIGHT ARM  Final   Special Requests BOTTLES DRAWN AEROBIC AND ANAEROBIC 6CC  Final   Culture NO  GROWTH 4 DAYS  Final   Report Status PENDING  Incomplete  Urine culture     Status: None   Collection Time: 10/19/15  8:05 PM  Result Value Ref Range Status   Specimen Description URINE, CLEAN CATCH  Final   Special Requests Normal  Final   Culture   Final    >=100,000 COLONIES/mL ESCHERICHIA COLI Performed at [facility]    Report Status 10/22/2015 FINAL  Final   Organism ID, Bacteria ESCHERICHIA COLI  Final      Susceptibility   Escherichia coli - MIC*    AMPICILLIN >=32 RESISTANT Resistant     CEFAZOLIN <=4 SENSITIVE Sensitive     CEFTRIAXONE <=1 SENSITIVE Sensitive     CIPROFLOXACIN 0.5 SENSITIVE Sensitive     GENTAMICIN <=1 SENSITIVE Sensitive     IMIPENEM <=0.25 SENSITIVE Sensitive     NITROFURANTOIN <=16 SENSITIVE Sensitive     TRIMETH/SULFA 160 RESISTANT Resistant     AMPICILLIN/SULBACTAM >=32 RESISTANT Resistant     PIP/TAZO <=4 SENSITIVE Sensitive     * >=100,000 COLONIES/mL ESCHERICHIA COLI  Blood culture (routine x 2)     Status: None (Preliminary result)   Collection Time: 10/19/15  8:12 PM  Result Value Ref Range Status   Specimen Description BLOOD RIGHT HAND  Final   Special Requests BOTTLES DRAWN AEROBIC AND ANAEROBIC 6CC  Final   Culture NO GROWTH 4 DAYS  Final   Report Status PENDING  Incomplete  MRSA PCR Screening     Status: None   Collection Time: 10/20/15 12:15 PM  Result Value Ref Range Status   MRSA by PCR NEGATIVE NEGATIVE Final    Comment:        The GeneXpert MRSA Assay (FDA approved for NASAL specimens only), is one component of a comprehensive MRSA colonization surveillance program. It is not intended to diagnose MRSA infection nor to guide or monitor treatment for MRSA infections.    Studies/Results: No results found.  Medications:  Prior to Admission:  Prescriptions prior to admission  Medication Sig Dispense Refill Last Dose  . albuterol (PROVENTIL) (2.5 MG/3ML) 0.083% nebulizer solution Take 2.5 mg by nebulization 3  (three) times daily. *may be given every 2 hours as needed for breathing/shortness of breath   unknown  . Amino Acids-Protein Hydrolys (FEEDING SUPPLEMENT, PRO-STAT 64,) LIQD Take 30 mLs by mouth 2 (two) times daily.   10/19/2015 at [place]  . aspirin EC 81 MG tablet Take 81 mg by mouth daily.   10/19/2015 at [place]  . clopidogrel (PLAVIX) 75 MG tablet Take 1 tablet (75 mg total) by mouth daily with breakfast. 30 tablet 3 10/19/2015 at [place]  . clotrimazole (LOTRIMIN) 1 % cream Apply 1 application topically 4 (four) times daily as needed (to corners of mouth/lips as needed).   unknown  . digoxin (LANOXIN) 0.125 MG tablet Take 1 tablet (0.125 mg total) by mouth daily. 30 tablet 3 10/19/2015 at [facility]  . diltiazem (CARDIZEM) 30 MG tablet Take 1 tablet (30 mg total) by mouth 4 (four) times daily. 120 tablet 3 10/19/2015 at Unknown time  . fluticasone (FLOVENT HFA) 44 MCG/ACT inhaler Inhale 2 puffs into the lungs 2 (two) times daily.   10/19/2015 at [place]  . furosemide (  LASIX) 20 MG tablet Take 20 mg by mouth daily.    10/19/2015 at [facility]  . guaiFENesin (MUCINEX) 600 MG 12 hr tablet Take 1 tablet (600 mg total) by mouth 2 (two) times daily. 14 tablet 0 10/19/2015 at [place]  . HYDROcodone-acetaminophen (NORCO) 5-325 MG tablet Take one tablet by mouth every 6 hours as needed for pain. Max APAP 3gm/24hrs from all sources 120 tablet 0 unknown  . ipratropium-albuterol (DUONEB) 0.5-2.5 (3) MG/3ML SOLN Take 3 mLs by nebulization 2 (two) times daily. (Patient taking differently: Take 3 mLs by nebulization 3 (three) times daily. ) 360 mL 3 10/19/2015 at Unknown time  . Melatonin 3 MG TABS Take 3 mg by mouth at bedtime.   10/18/2015 at 2100  . metFORMIN (GLUCOPHAGE) 500 MG tablet Take 500 mg by mouth 2 (two) times daily with a meal.   10/19/2015 at [facility]  . nystatin (MYCOSTATIN) 100000 UNIT/ML suspension Take 5 mLs by mouth 4 (four) times daily.   10/19/2015 at Unknown time  . OXYGEN Inhale 2 L into the lungs daily.   10/19/2015 at Unknown  time  . potassium chloride (K-DUR,KLOR-CON) 10 MEQ tablet Take 10 mEq by mouth daily.    10/19/2015 at [facility]  . pravastatin (PRAVACHOL) 40 MG tablet Take 2 tablets (80 mg total) by mouth every evening. 30 tablet 0 10/18/2015 at 2100  . saccharomyces boulardii (FLORASTOR) 250 MG capsule Take 250 mg by mouth 2 (two) times daily.   10/19/2015 at [place]  . senna-docusate (SENOKOT-S) 8.6-50 MG tablet Take 1 tablet by mouth at bedtime as needed for mild constipation. 20 tablet 0 unknown  . sertraline (ZOLOFT) 50 MG tablet Take 50 mg by mouth every morning.   10/19/2015 at [place]  . traZODone (DESYREL) 50 MG tablet Take 1 tablet (50 mg total) by mouth at bedtime as needed for sleep.   unknown  . trypsin-balsam-castor oil (XENADERM) ointment Apply 1 application topically daily. **Apply to coccyx, bilateral buttocls, and saccrum at every shift and as needed**   10/19/2015 at Unknown time   Scheduled: . aspirin EC  81 mg Oral Daily  . ceFEPime (MAXIPIME) IV  1 g Intravenous 3 times per day  . digoxin  0.125 mg Oral Daily  . diltiazem  30 mg Oral QID  . enoxaparin (LOVENOX) injection  40 mg Subcutaneous Q24H  . furosemide  20 mg Intravenous Q12H  . guaiFENesin  600 mg Oral BID  . insulin aspart  0-9 Units Subcutaneous TID WC  . ipratropium  0.5 mg Nebulization TID  . levalbuterol  0.63 mg Nebulization TID  . methylPREDNISolone (SOLU-MEDROL) injection  60 mg Intravenous Q6H  . [START ON 10/27/2015] potassium chloride  10 mEq Oral BID  . potassium chloride  20 mEq Oral BID  . vancomycin  500 mg Intravenous Q12H   Continuous: . sodium chloride 10 mL/hr at 10/23/15 2209   RAQ:[id number], ondansetron **OR** ondansetron (ZOFRAN) IV, RESOURCE THICKENUP CLEAR, traZODone  Assesment: She was admitted with healthcare associated pneumonia and seems to be improving. At baseline she has severe COPD and chronic hypoxic respiratory failure. Active Problems:   Chronic systolic CHF (congestive heart failure)  (HCC)   COPD (chronic obstructive pulmonary disease) (HCC)   A-fib (HCC)   HCAP (healthcare-associated pneumonia)   Pressure ulcer    Plan: Continue current treatments.    LOS: 5 days   Nyaire Denbleyker L 10/24/2015, 8:44 AM

## 2015-10-24 NOTE — Progress Notes (Signed)
Pharmacy Antibiotic Note  Tara GuyJane F Park is a 70 y.o. female admitted on 10/19/2015 with pneumonia.  Pharmacy has been consulted for Vancomycin and Cefepime dosing. Vanc trough this am is 16 mcg/ml  Plan: Cont Vancomycin 500mg  IV every 12 hr Cefepime 1gm IV every 8 hourHeight: 5\' 6"  (167.6 cm) Weight: 151 lb 14.4 oz (68.9 kg) IBW/kg (Calculated) : 59.3  Temp (24hrs), Avg:98 F (36.7 C), Min:97.6 F (36.4 C), Max:98.3 F (36.8 C)   Recent Labs Lab 10/19/15 1829 10/20/15 0429 10/21/15 0644 10/22/15 0654 10/23/15 0640 10/23/15 1240 10/24/15 0457  WBC 32.7* 16.4* 20.5*  --   --  15.0* 13.4*  CREATININE 0.47 0.46 0.40* 0.36* 0.44  --  0.46  VANCOTROUGH  --   --   --   --   --   --  16    Estimated Creatinine Clearance: 60.4 mL/min (by C-G formula based on Cr of 0.46).    No Known Allergies  Antimicrobials this admission: Vancomycin 3/29 >>  Cefepime 3/29 >>   Microbiology results: 3/29  BCx: pending 3/29 UCx: E coli 2/5 MRSA PCR: negative  Thank you for allowing pharmacy to be a part of this patient's care.  Tara Park Pharm D Clinical Pharmacist 10/24/2015 7:44 AM

## 2015-10-24 NOTE — Progress Notes (Signed)
Potassium 2.7 despite 40 mEq oral supplementation daily given 4 runs of KCl IV will increase KCl to 20 mEq by mouth 3 times a day and decrease Lasix to 20 mg by mouth daily instead of twice a day monitor be met in a.m. Solu-Medrol decreased to 80 mg IV every 8 MICHAIAH MAIDEN TMH:[id number] DOB: Apr 01, 1946 DOA: 10/19/2015 PCP: Maricela Curet, MD             Physical Exam: Blood pressure 99/78, pulse 129, temperature 98.3 F (36.8 C), temperature source Oral, resp. rate 20, height _0  (1.676 m), weight 151 lb 14.4 oz (68.9 kg), SpO2 94 %. lungs show diminished breath sounds in the bases scattered rhonchi mild end expiratory wheeze no rales audible heart regular rhythm no S3 or S4 no heaves thrills rubs abdomen soft nontender bowel sounds normoactive   Investigations:  Recent Results (from the past 240 hour(s))  Blood culture (routine x 2)     Status: None   Collection Time: 10/19/15  8:00 PM  Result Value Ref Range Status   Specimen Description BLOOD RIGHT ARM  Final   Special Requests BOTTLES DRAWN AEROBIC AND ANAEROBIC 6CC  Final   Culture NO GROWTH 5 DAYS  Final   Report Status 10/24/2015 FINAL  Final  Urine culture     Status: None   Collection Time: 10/19/15  8:05 PM  Result Value Ref Range Status   Specimen Description URINE, CLEAN CATCH  Final   Special Requests Normal  Final   Culture   Final    >=100,000 COLONIES/mL ESCHERICHIA COLI Performed at [facility]    Report Status 10/22/2015 FINAL  Final   Organism ID, Bacteria ESCHERICHIA COLI  Final      Susceptibility   Escherichia coli - MIC*    AMPICILLIN >=32 RESISTANT Resistant     CEFAZOLIN <=4 SENSITIVE Sensitive     CEFTRIAXONE <=1 SENSITIVE Sensitive     CIPROFLOXACIN 0.5 SENSITIVE Sensitive     GENTAMICIN <=1 SENSITIVE Sensitive     IMIPENEM <=0.25 SENSITIVE Sensitive     NITROFURANTOIN <=16 SENSITIVE Sensitive     TRIMETH/SULFA 160 RESISTANT Resistant     AMPICILLIN/SULBACTAM >=32 RESISTANT  Resistant     PIP/TAZO <=4 SENSITIVE Sensitive     * >=100,000 COLONIES/mL ESCHERICHIA COLI  Blood culture (routine x 2)     Status: None   Collection Time: 10/19/15  8:12 PM  Result Value Ref Range Status   Specimen Description BLOOD RIGHT HAND  Final   Special Requests BOTTLES DRAWN AEROBIC AND ANAEROBIC 6CC  Final   Culture NO GROWTH 5 DAYS  Final   Report Status 10/24/2015 FINAL  Final  MRSA PCR Screening     Status: None   Collection Time: 10/20/15 12:15 PM  Result Value Ref Range Status   MRSA by PCR NEGATIVE NEGATIVE Final    Comment:        The GeneXpert MRSA Assay (FDA approved for NASAL specimens only), is one component of a comprehensive MRSA colonization surveillance program. It is not intended to diagnose MRSA infection nor to guide or monitor treatment for MRSA infections.      Basic Metabolic Panel:  Recent Labs  10/23/15 0640 10/24/15 0457  NA 135 137  K 3.1* 2.7*  CL 96* 96*  CO2 32 32  GLUCOSE 182* 168*  BUN 18 20  CREATININE 0.44 0.46  CALCIUM 7.4* 7.8*   Liver Function Tests: No results for input(s): AST, ALT, ALKPHOS, BILITOT, PROT,  ALBUMIN in the last 72 hours.   CBC:  Recent Labs  10/23/15 1240 10/24/15 0457  WBC 15.0* 13.4*  NEUTROABS 14.1* 12.4*  HGB 10.8* 11.2*  HCT 33.0* 34.3*  MCV 75.9* 75.1*  PLT 240 237    No results found.    Medications:   Impression:  Active Problems:   Chronic systolic CHF (congestive heart failure) (HCC)   COPD (chronic obstructive pulmonary disease) (HCC)   A-fib (HCC)   HCAP (healthcare-associated pneumonia)   Pressure ulcer     Plan: Decrease Lasix from twice a day to 20 g daily increase KCl to 20 mEq by mouth 3 times a day check be met in a.m.  Consultants: Pulmonary   Procedures   Antibiotics: Vancomycin and cefepime                  Code Status: Full  Family Communication:    Disposition Plan see plan above  Time spent: 30 minutes   LOS: 5 days    Timmy Cleverly M   10/24/2015, 1:00 PM     VALLERIE HENTZ TTS:[id number] DOB: 01/02/1946 DOA: 10/19/2015 PCP: Maricela Curet, MD             Physical Exam: Blood pressure 99/78, pulse 129, temperature 98.3 F (36.8 C), temperature source Oral, resp. rate 20, height _0  (1.676 m), weight 151 lb 14.4 oz (68.9 kg), SpO2 94 %.   Investigations:  Recent Results (from the past 240 hour(s))  Blood culture (routine x 2)     Status: None   Collection Time: 10/19/15  8:00 PM  Result Value Ref Range Status   Specimen Description BLOOD RIGHT ARM  Final   Special Requests BOTTLES DRAWN AEROBIC AND ANAEROBIC 6CC  Final   Culture NO GROWTH 5 DAYS  Final   Report Status 10/24/2015 FINAL  Final  Urine culture     Status: None   Collection Time: 10/19/15  8:05 PM  Result Value Ref Range Status   Specimen Description URINE, CLEAN CATCH  Final   Special Requests Normal  Final   Culture   Final    >=100,000 COLONIES/mL ESCHERICHIA COLI Performed at [facility]    Report Status 10/22/2015 FINAL  Final   Organism ID, Bacteria ESCHERICHIA COLI  Final      Susceptibility   Escherichia coli - MIC*    AMPICILLIN >=32 RESISTANT Resistant     CEFAZOLIN <=4 SENSITIVE Sensitive     CEFTRIAXONE <=1 SENSITIVE Sensitive     CIPROFLOXACIN 0.5 SENSITIVE Sensitive     GENTAMICIN <=1 SENSITIVE Sensitive     IMIPENEM <=0.25 SENSITIVE Sensitive     NITROFURANTOIN <=16 SENSITIVE Sensitive     TRIMETH/SULFA 160 RESISTANT Resistant     AMPICILLIN/SULBACTAM >=32 RESISTANT Resistant     PIP/TAZO <=4 SENSITIVE Sensitive     * >=100,000 COLONIES/mL ESCHERICHIA COLI  Blood culture (routine x 2)     Status: None   Collection Time: 10/19/15  8:12 PM  Result Value Ref Range Status   Specimen Description BLOOD RIGHT HAND  Final   Special Requests BOTTLES DRAWN AEROBIC AND ANAEROBIC 6CC  Final   Culture NO GROWTH 5 DAYS  Final   Report Status 10/24/2015 FINAL  Final  MRSA PCR Screening      Status: None   Collection Time: 10/20/15 12:15 PM  Result Value Ref Range Status   MRSA by PCR NEGATIVE NEGATIVE Final    Comment:        The  GeneXpert MRSA Assay (FDA approved for NASAL specimens only), is one component of a comprehensive MRSA colonization surveillance program. It is not intended to diagnose MRSA infection nor to guide or monitor treatment for MRSA infections.      Basic Metabolic Panel:  Recent Labs  10/23/15 0640 10/24/15 0457  NA 135 137  K 3.1* 2.7*  CL 96* 96*  CO2 32 32  GLUCOSE 182* 168*  BUN 18 20  CREATININE 0.44 0.46  CALCIUM 7.4* 7.8*   Liver Function Tests: No results for input(s): AST, ALT, ALKPHOS, BILITOT, PROT, ALBUMIN in the last 72 hours.   CBC:  Recent Labs  10/23/15 1240 10/24/15 0457  WBC 15.0* 13.4*  NEUTROABS 14.1* 12.4*  HGB 10.8* 11.2*  HCT 33.0* 34.3*  MCV 75.9* 75.1*  PLT 240 237    No results found.    Medications:   Impression:  Active Problems:   Chronic systolic CHF (congestive heart failure) (HCC)   COPD (chronic obstructive pulmonary disease) (HCC)   A-fib (HCC)   HCAP (healthcare-associated pneumonia)   Pressure ulcer     Plan:  Consultants:    Procedures  Antibiotics:                   Code Status:  Family Communication:    Disposition Plan   Time spent: 30 minutes previous.this note is void and invalid   LOS: 5 days   Tiffny Gemmer M   10/24/2015, 1:00 PM

## 2015-10-24 NOTE — Progress Notes (Signed)
RN called Dr. Janna ArchonDiego and notified him of patient's potassium, heart rat 100-140s, and BP.  Dr. Janna ArchonDiego gave order for 4 runs of IV Potassium and NS at 75 ml/hr.

## 2015-10-24 NOTE — Progress Notes (Signed)
Text paged MD( Dr. Janna Archondiego) regarding potassium level 2.7 this morning await return response/new orders.

## 2015-10-25 LAB — BASIC METABOLIC PANEL
Anion gap: 8 (ref 5–15)
BUN: 17 mg/dL (ref 6–20)
CALCIUM: 7.4 mg/dL — AB (ref 8.9–10.3)
CHLORIDE: 100 mmol/L — AB (ref 101–111)
CO2: 27 mmol/L (ref 22–32)
CREATININE: 0.41 mg/dL — AB (ref 0.44–1.00)
GFR calc Af Amer: >60 mL/min (ref >60)
Glucose, Bld: 210 mg/dL — ABNORMAL HIGH (ref 65–99)
Potassium: 3.5 mmol/L (ref 3.5–5.1)
SODIUM: 135 mmol/L (ref 135–145)

## 2015-10-25 LAB — GLUCOSE, CAPILLARY
GLUCOSE-CAPILLARY: 189 mg/dL — AB (ref 65–99)
Glucose-Capillary: 180 mg/dL — ABNORMAL HIGH (ref 65–99)
Glucose-Capillary: 115 mg/dL — ABNORMAL HIGH (ref 65–99)
Glucose-Capillary: 182 mg/dL — ABNORMAL HIGH (ref 65–99)

## 2015-10-25 MED ORDER — PREDNISONE 20 MG PO TABS
50.0000 mg | ORAL_TABLET | Freq: Every day | ORAL | Status: DC
  Administered 2015-10-26 – 2015-10-27 (×2): 50 mg via ORAL
  Filled 2015-10-25 (×2): qty 2

## 2015-10-25 MED ORDER — MAGIC MOUTHWASH
5.0000 mL | Freq: Three times a day (TID) | ORAL | Status: DC
  Administered 2015-10-25 – 2015-10-27 (×9): 5 mL via ORAL
  Filled 2015-10-25 (×9): qty 5

## 2015-10-25 NOTE — Progress Notes (Signed)
Patient appears to have a lack of energy I Stimulation will switch to by mouth prednisone 50 mg per day in hopes of discharge back to skilled nursing facility in a.m. continue dual antibiotics until the Clint GuyJane F Korus BJY:[id number] DOB: 1946/02/20 DOA: 10/19/2015 PCP: [name],Shatasha Lambing M, MD             Physical Exam: Blood pressure 135/69, pulse 92, temperature 98.9 F (37.2 C), temperature source Oral, resp. rate 20, height 5\' 6"  (1.676 m), weight 151 lb 14.4 oz (68.9 kg), SpO2 95 %. lungs diminished breath sounds in the bases no audible wheeze and some scattered rhonchi no rales audible heart regular rhythm no S3 or S4 no heaves thrills or rubs   Investigations:  Recent Results (from the past 240 hour(s))  Blood culture (routine x 2)     Status: None   Collection Time: 10/19/15  8:00 PM  Result Value Ref Range Status   Specimen Description BLOOD RIGHT ARM  Final   Special Requests BOTTLES DRAWN AEROBIC AND ANAEROBIC 6CC  Final   Culture NO GROWTH 5 DAYS  Final   Report Status 10/24/2015 FINAL  Final  Urine culture     Status: None   Collection Time: 10/19/15  8:05 PM  Result Value Ref Range Status   Specimen Description URINE, CLEAN CATCH  Final   Special Requests Normal  Final   Culture   Final    >=100,000 COLONIES/mL ESCHERICHIA COLI Performed at [facility]    Report Status 10/22/2015 FINAL  Final   Organism ID, Bacteria ESCHERICHIA COLI  Final      Susceptibility   Escherichia coli - MIC*    AMPICILLIN >=32 RESISTANT Resistant     CEFAZOLIN <=4 SENSITIVE Sensitive     CEFTRIAXONE <=1 SENSITIVE Sensitive     CIPROFLOXACIN 0.5 SENSITIVE Sensitive     GENTAMICIN <=1 SENSITIVE Sensitive     IMIPENEM <=0.25 SENSITIVE Sensitive     NITROFURANTOIN <=16 SENSITIVE Sensitive     TRIMETH/SULFA 160 RESISTANT Resistant     AMPICILLIN/SULBACTAM >=32 RESISTANT Resistant     PIP/TAZO <=4 SENSITIVE Sensitive     * >=100,000 COLONIES/mL ESCHERICHIA COLI  Blood  culture (routine x 2)     Status: None   Collection Time: 10/19/15  8:12 PM  Result Value Ref Range Status   Specimen Description BLOOD RIGHT HAND  Final   Special Requests BOTTLES DRAWN AEROBIC AND ANAEROBIC 6CC  Final   Culture NO GROWTH 5 DAYS  Final   Report Status 10/24/2015 FINAL  Final  MRSA PCR Screening     Status: None   Collection Time: 10/20/15 12:15 PM  Result Value Ref Range Status   MRSA by PCR NEGATIVE NEGATIVE Final    Comment:        The GeneXpert MRSA Assay (FDA approved for NASAL specimens only), is one component of a comprehensive MRSA colonization surveillance program. It is not intended to diagnose MRSA infection nor to guide or monitor treatment for MRSA infections.      Basic Metabolic Panel:  Recent Labs  08/65/7802/10/07 0457 10/25/15 0544  NA 137 135  K 2.7* 3.5  CL 96* 100*  CO2 32 27  GLUCOSE 168* 210*  BUN 20 17  CREATININE 0.46 0.41*  CALCIUM 7.8* 7.4*   Liver Function Tests: No results for input(s): AST, ALT, ALKPHOS, BILITOT, PROT, ALBUMIN in the last 72 hours.   CBC:  Recent Labs  10/23/15 1240 10/24/15 0457  WBC 15.0* 13.4*  NEUTROABS 14.1* 12.4*  HGB 10.8* 11.2*  HCT 33.0* 34.3*  MCV 75.9* 75.1*  PLT 240 237    No results found.    Medications:   Impression:  Active Problems:   Chronic systolic CHF (congestive heart failure) (HCC)   COPD (chronic obstructive pulmonary disease) (HCC)   A-fib (HCC)   HCAP (healthcare-associated pneumonia)   Pressure ulcer     Plan: DC IV Solu-Medrol and prednisone 50 by mouth daily observe if proximal spasm is kept at bay consider discharge in a.m.  Consultants: Pulmonary   Procedures   Antibiotics: Vancomycin and cefepime                  Code Status:   Family Communication:    Disposition Plan see plan above  Time spent: 30 minutes   LOS: 6 days   Patty Leitzke M   10/25/2015, 1:53 PM

## 2015-10-25 NOTE — Progress Notes (Signed)
Subjective: She says she feels better. She is still coughing. She complains that she has thrush  Objective: Vital signs in last 24 hours: Temp:  [98.3 F (36.8 C)-98.9 F (37.2 C)] 98.9 F (37.2 C) (04/04 0537) Pulse Rate:  [62-110] 92 (04/04 0537) Resp:  [20] 20 (04/04 0537) BP: (117-135)/(61-84) 135/69 mmHg (04/04 0537) SpO2:  [91 %-100 %] 95 % (04/04 0719) Weight change:  Last BM Date: 10/24/15  Intake/Output from previous day: 04/03 0701 - 04/04 0700 In: 720 [P.O.:720] Out: -   PHYSICAL EXAM General appearance: alert, cooperative and mild distress Resp: rhonchi bilaterally Cardio: irregularly irregular rhythm GI: soft, non-tender; bowel sounds normal; no masses,  no organomegaly Extremities: extremities normal, atraumatic, no cyanosis or edema  Lab Results:  Results for orders placed or performed during the hospital encounter of 10/19/15 (from the past 48 hour(s))  Glucose, capillary     Status: Abnormal   Collection Time: 10/23/15 11:26 AM  Result Value Ref Range   Glucose-Capillary 209 (H) 65 - 99 mg/dL  CBC with Differential/Platelet     Status: Abnormal   Collection Time: 10/23/15 12:40 PM  Result Value Ref Range   WBC 15.0 (H) 4.0 - 10.5 K/uL   RBC 4.35 3.87 - 5.11 MIL/uL   Hemoglobin 10.8 (L) 12.0 - 15.0 g/dL   HCT 33.0 (L) 36.0 - 46.0 %   MCV 75.9 (L) 78.0 - 100.0 fL   MCH 24.8 (L) 26.0 - 34.0 pg   MCHC 32.7 30.0 - 36.0 g/dL   RDW 16.3 (H) 11.5 - 15.5 %   Platelets 240 150 - 400 K/uL   Neutrophils Relative % 94 %   Neutro Abs 14.1 (H) 1.7 - 7.7 K/uL   Lymphocytes Relative 5 %   Lymphs Abs 0.7 0.7 - 4.0 K/uL   Monocytes Relative 1 %   Monocytes Absolute 0.2 0.1 - 1.0 K/uL   Eosinophils Relative 0 %   Eosinophils Absolute 0.0 0.0 - 0.7 K/uL   Basophils Relative 0 %   Basophils Absolute 0.0 0.0 - 0.1 K/uL  Glucose, capillary     Status: Abnormal   Collection Time: 10/23/15  4:31 PM  Result Value Ref Range   Glucose-Capillary 192 (H) 65 - 99 mg/dL   Glucose, capillary     Status: Abnormal   Collection Time: 10/23/15  5:07 PM  Result Value Ref Range   Glucose-Capillary 202 (H) 65 - 99 mg/dL  Glucose, capillary     Status: Abnormal   Collection Time: 10/23/15  9:55 PM  Result Value Ref Range   Glucose-Capillary 163 (H) 65 - 99 mg/dL   Comment 1 Notify RN    Comment 2 Document in Chart   Basic metabolic panel     Status: Abnormal   Collection Time: 10/24/15  4:57 AM  Result Value Ref Range   Sodium 137 135 - 145 mmol/L   Potassium 2.7 (LL) 3.5 - 5.1 mmol/L    Comment: CRITICAL RESULT CALLED TO, READ BACK BY AND VERIFIED WITH: WILSON,A AT 6:25AM ON 10/24/15 BY FESTERMAN,C    Chloride 96 (L) 101 - 111 mmol/L   CO2 32 22 - 32 mmol/L   Glucose, Bld 168 (H) 65 - 99 mg/dL   BUN 20 6 - 20 mg/dL   Creatinine, Ser 0.46 0.44 - 1.00 mg/dL   Calcium 7.8 (L) 8.9 - 10.3 mg/dL   GFR calc non Af Amer >60 >60 mL/min   GFR calc Af Amer >60 >60 mL/min  Comment: (NOTE) The eGFR has been calculated using the CKD EPI equation. This calculation has not been validated in all clinical situations. eGFR's persistently <60 mL/min signify possible Chronic Kidney Disease.    Anion gap 9 5 - 15  Vancomycin, trough     Status: None   Collection Time: 10/24/15  4:57 AM  Result Value Ref Range   Vancomycin Tr 16 10.0 - 20.0 ug/mL  CBC with Differential/Platelet     Status: Abnormal   Collection Time: 10/24/15  4:57 AM  Result Value Ref Range   WBC 13.4 (H) 4.0 - 10.5 K/uL   RBC 4.57 3.87 - 5.11 MIL/uL   Hemoglobin 11.2 (L) 12.0 - 15.0 g/dL   HCT 34.3 (L) 36.0 - 46.0 %   MCV 75.1 (L) 78.0 - 100.0 fL   MCH 24.5 (L) 26.0 - 34.0 pg   MCHC 32.7 30.0 - 36.0 g/dL   RDW 16.4 (H) 11.5 - 15.5 %   Platelets 237 150 - 400 K/uL   Neutrophils Relative % 93 %   Neutro Abs 12.4 (H) 1.7 - 7.7 K/uL   Lymphocytes Relative 3 %   Lymphs Abs 0.4 (L) 0.7 - 4.0 K/uL   Monocytes Relative 4 %   Monocytes Absolute 0.5 0.1 - 1.0 K/uL   Eosinophils Relative 0 %    Eosinophils Absolute 0.0 0.0 - 0.7 K/uL   Basophils Relative 0 %   Basophils Absolute 0.0 0.0 - 0.1 K/uL  Glucose, capillary     Status: Abnormal   Collection Time: 10/24/15  7:52 AM  Result Value Ref Range   Glucose-Capillary 167 (H) 65 - 99 mg/dL  Glucose, capillary     Status: Abnormal   Collection Time: 10/24/15 11:58 AM  Result Value Ref Range   Glucose-Capillary 162 (H) 65 - 99 mg/dL   Comment 1 Notify RN   Glucose, capillary     Status: Abnormal   Collection Time: 10/24/15  4:22 PM  Result Value Ref Range   Glucose-Capillary 167 (H) 65 - 99 mg/dL   Comment 1 Notify RN   Glucose, capillary     Status: Abnormal   Collection Time: 10/24/15  9:49 PM  Result Value Ref Range   Glucose-Capillary 193 (H) 65 - 99 mg/dL  Basic metabolic panel     Status: Abnormal   Collection Time: 10/25/15  5:44 AM  Result Value Ref Range   Sodium 135 135 - 145 mmol/L   Potassium 3.5 3.5 - 5.1 mmol/L    Comment: DELTA CHECK NOTED   Chloride 100 (L) 101 - 111 mmol/L   CO2 27 22 - 32 mmol/L   Glucose, Bld 210 (H) 65 - 99 mg/dL   BUN 17 6 - 20 mg/dL   Creatinine, Ser 0.41 (L) 0.44 - 1.00 mg/dL   Calcium 7.4 (L) 8.9 - 10.3 mg/dL   GFR calc non Af Amer >60 >60 mL/min   GFR calc Af Amer >60 >60 mL/min    Comment: (NOTE) The eGFR has been calculated using the CKD EPI equation. This calculation has not been validated in all clinical situations. eGFR's persistently <60 mL/min signify possible Chronic Kidney Disease.    Anion gap 8 5 - 15  Glucose, capillary     Status: Abnormal   Collection Time: 10/25/15  7:24 AM  Result Value Ref Range   Glucose-Capillary 189 (H) 65 - 99 mg/dL   Comment 1 Notify RN    Comment 2 Document in Chart     ABGS  No results for input(s): PHART, PO2ART, TCO2, HCO3 in the last 72 hours.  Invalid input(s): PCO2 CULTURES Recent Results (from the past 240 hour(s))  Blood culture (routine x 2)     Status: None   Collection Time: 10/19/15  8:00 PM  Result Value Ref  Range Status   Specimen Description BLOOD RIGHT ARM  Final   Special Requests BOTTLES DRAWN AEROBIC AND ANAEROBIC 6CC  Final   Culture NO GROWTH 5 DAYS  Final   Report Status 10/24/2015 FINAL  Final  Urine culture     Status: None   Collection Time: 10/19/15  8:05 PM  Result Value Ref Range Status   Specimen Description URINE, CLEAN CATCH  Final   Special Requests Normal  Final   Culture   Final    >=100,000 COLONIES/mL ESCHERICHIA COLI Performed at [facility]    Report Status 10/22/2015 FINAL  Final   Organism ID, Bacteria ESCHERICHIA COLI  Final      Susceptibility   Escherichia coli - MIC*    AMPICILLIN >=32 RESISTANT Resistant     CEFAZOLIN <=4 SENSITIVE Sensitive     CEFTRIAXONE <=1 SENSITIVE Sensitive     CIPROFLOXACIN 0.5 SENSITIVE Sensitive     GENTAMICIN <=1 SENSITIVE Sensitive     IMIPENEM <=0.25 SENSITIVE Sensitive     NITROFURANTOIN <=16 SENSITIVE Sensitive     TRIMETH/SULFA 160 RESISTANT Resistant     AMPICILLIN/SULBACTAM >=32 RESISTANT Resistant     PIP/TAZO <=4 SENSITIVE Sensitive     * >=100,000 COLONIES/mL ESCHERICHIA COLI  Blood culture (routine x 2)     Status: None   Collection Time: 10/19/15  8:12 PM  Result Value Ref Range Status   Specimen Description BLOOD RIGHT HAND  Final   Special Requests BOTTLES DRAWN AEROBIC AND ANAEROBIC 6CC  Final   Culture NO GROWTH 5 DAYS  Final   Report Status 10/24/2015 FINAL  Final  MRSA PCR Screening     Status: None   Collection Time: 10/20/15 12:15 PM  Result Value Ref Range Status   MRSA by PCR NEGATIVE NEGATIVE Final    Comment:        The GeneXpert MRSA Assay (FDA approved for NASAL specimens only), is one component of a comprehensive MRSA colonization surveillance program. It is not intended to diagnose MRSA infection nor to guide or monitor treatment for MRSA infections.    Studies/Results: No results found.  Medications:  Prior to Admission:  Prescriptions prior to admission  Medication  Sig Dispense Refill Last Dose  . albuterol (PROVENTIL) (2.5 MG/3ML) 0.083% nebulizer solution Take 2.5 mg by nebulization 3 (three) times daily. *may be given every 2 hours as needed for breathing/shortness of breath   unknown  . Amino Acids-Protein Hydrolys (FEEDING SUPPLEMENT, PRO-STAT 64,) LIQD Take 30 mLs by mouth 2 (two) times daily.   10/19/2015 at [place]  . aspirin EC 81 MG tablet Take 81 mg by mouth daily.   10/19/2015 at [place]  . clopidogrel (PLAVIX) 75 MG tablet Take 1 tablet (75 mg total) by mouth daily with breakfast. 30 tablet 3 10/19/2015 at [place]  . clotrimazole (LOTRIMIN) 1 % cream Apply 1 application topically 4 (four) times daily as needed (to corners of mouth/lips as needed).   unknown  . digoxin (LANOXIN) 0.125 MG tablet Take 1 tablet (0.125 mg total) by mouth daily. 30 tablet 3 10/19/2015 at [facility]  . diltiazem (CARDIZEM) 30 MG tablet Take 1 tablet (30 mg total) by mouth 4 (four) times daily.  120 tablet 3 10/19/2015 at Unknown time  . fluticasone (FLOVENT HFA) 44 MCG/ACT inhaler Inhale 2 puffs into the lungs 2 (two) times daily.   10/19/2015 at [place]  . furosemide (LASIX) 20 MG tablet Take 20 mg by mouth daily.    10/19/2015 at [facility]  . guaiFENesin (MUCINEX) 600 MG 12 hr tablet Take 1 tablet (600 mg total) by mouth 2 (two) times daily. 14 tablet 0 10/19/2015 at [place]  . HYDROcodone-acetaminophen (NORCO) 5-325 MG tablet Take one tablet by mouth every 6 hours as needed for pain. Max APAP 3gm/24hrs from all sources 120 tablet 0 unknown  . ipratropium-albuterol (DUONEB) 0.5-2.5 (3) MG/3ML SOLN Take 3 mLs by nebulization 2 (two) times daily. (Patient taking differently: Take 3 mLs by nebulization 3 (three) times daily. ) 360 mL 3 10/19/2015 at Unknown time  . Melatonin 3 MG TABS Take 3 mg by mouth at bedtime.   10/18/2015 at 2100  . metFORMIN (GLUCOPHAGE) 500 MG tablet Take 500 mg by mouth 2 (two) times daily with a meal.   10/19/2015 at [facility]  . nystatin (MYCOSTATIN) 100000 UNIT/ML suspension Take 5 mLs  by mouth 4 (four) times daily.   10/19/2015 at Unknown time  . OXYGEN Inhale 2 L into the lungs daily.   10/19/2015 at Unknown time  . potassium chloride (K-DUR,KLOR-CON) 10 MEQ tablet Take 10 mEq by mouth daily.    10/19/2015 at [facility]  . pravastatin (PRAVACHOL) 40 MG tablet Take 2 tablets (80 mg total) by mouth every evening. 30 tablet 0 10/18/2015 at 2100  . saccharomyces boulardii (FLORASTOR) 250 MG capsule Take 250 mg by mouth 2 (two) times daily.   10/19/2015 at [place]  . senna-docusate (SENOKOT-S) 8.6-50 MG tablet Take 1 tablet by mouth at bedtime as needed for mild constipation. 20 tablet 0 unknown  . sertraline (ZOLOFT) 50 MG tablet Take 50 mg by mouth every morning.   10/19/2015 at [place]  . traZODone (DESYREL) 50 MG tablet Take 1 tablet (50 mg total) by mouth at bedtime as needed for sleep.   unknown  . trypsin-balsam-castor oil (XENADERM) ointment Apply 1 application topically daily. **Apply to coccyx, bilateral buttocls, and saccrum at every shift and as needed**   10/19/2015 at Unknown time   Scheduled: . aspirin EC  81 mg Oral Daily  . ceFEPime (MAXIPIME) IV  1 g Intravenous 3 times per day  . digoxin  0.125 mg Oral Daily  . diltiazem  30 mg Oral QID  . enoxaparin (LOVENOX) injection  40 mg Subcutaneous Q24H  . furosemide  20 mg Intravenous Daily  . guaiFENesin  600 mg Oral BID  . insulin aspart  0-9 Units Subcutaneous TID WC  . ipratropium  0.5 mg Nebulization TID  . levalbuterol  0.63 mg Nebulization TID  . methylPREDNISolone (SOLU-MEDROL) injection  80 mg Intravenous Q6H  . potassium chloride  20 mEq Oral TID  . vancomycin  500 mg Intravenous Q12H   Continuous: . sodium chloride 75 mL/hr at 10/24/15 2122   MVV:[id number], ondansetron **OR** ondansetron (ZOFRAN) IV, RESOURCE THICKENUP CLEAR, traZODone  Assesment: She was admitted with healthcare associated pneumonia and seems to be getting better. She has thrush. She is close to completing her antibiotics. Active  Problems:   Chronic systolic CHF (congestive heart failure) (HCC)   COPD (chronic obstructive pulmonary disease) (HCC)   A-fib (HCC)   HCAP (healthcare-associated pneumonia)   Pressure ulcer    Plan: Continue treatments.    LOS: 6 days   Ulyess Muto L  10/25/2015, 9:08 AM

## 2015-10-25 NOTE — Progress Notes (Signed)
Inpatient Diabetes Program Recommendations  AACE/ADA: New Consensus Statement on Inpatient Glycemic Control (2015)  Target Ranges:  Prepandial:   less than 140 mg/dL      Peak postprandial:   less than 180 mg/dL (1-2 hours)      Critically ill patients:  140 - 180 mg/dL   Review of Glycemic Control Results for Tara Park, Tara Park (MRN [id number]) as of 10/25/2015 09:47  Ref. Range 10/24/2015 07:52 10/24/2015 11:58 10/24/2015 16:22 10/24/2015 21:49 10/25/2015 07:24  Glucose-Capillary Latest Ref Range: 65-99 mg/dL 409167 (H) 811162 (H) 914167 (H) 193 (H) 189 (H)   Diabetes history: DM Type 2 Outpatient Diabetes medications: Metformin 500 mg bid Current orders for Inpatient glycemic control: Novolog correction scale 0-9 units tid.  Inpatient Diabetes Program Recommendations:  Noted patient eating approx. 50 % of meals. Please consider basal insulin 0.15 units/kg = 10 units q hs.  Thank you, Billy FischerJudy E. Terrah Decoster, RN, MSN, CDE Inpatient Glycemic Control Team Team Pager [phone] (8am-5pm) 10/25/2015 9:50 AM

## 2015-10-26 LAB — BASIC METABOLIC PANEL
ANION GAP: 7 (ref 5–15)
BUN: 15 mg/dL (ref 6–20)
CALCIUM: 7.6 mg/dL — AB (ref 8.9–10.3)
CO2: 26 mmol/L (ref 22–32)
Chloride: 103 mmol/L (ref 101–111)
Creatinine, Ser: <0.3 mg/dL — ABNORMAL LOW (ref 0.44–1.00)
Glucose, Bld: 182 mg/dL — ABNORMAL HIGH (ref 65–99)
POTASSIUM: 3.6 mmol/L (ref 3.5–5.1)
Sodium: 136 mmol/L (ref 135–145)

## 2015-10-26 LAB — GLUCOSE, CAPILLARY
GLUCOSE-CAPILLARY: 199 mg/dL — AB (ref 65–99)
GLUCOSE-CAPILLARY: 208 mg/dL — AB (ref 65–99)
GLUCOSE-CAPILLARY: 150 mg/dL — AB (ref 65–99)
Glucose-Capillary: 217 mg/dL — ABNORMAL HIGH (ref 65–99)

## 2015-10-26 NOTE — Progress Notes (Signed)
Discharge was contemplated for today however she has increased considerable bronchospasm will observe for another 24 hours Clint GuyJane F Gonet [id number] DOB: Jul 23, 1946 DOA: 10/19/2015 PCP: [name],Tiani Stanbery M, MD             Physical Exam: Blood pressure 144/78, pulse 105, temperature 97.9 F (36.6 C), temperature source Oral, resp. rate 21, height 5\' 6"  (1.676 m), weight 151 lb 14.4 oz (68.9 kg), SpO2 99 %. lungs show mild to moderate inspiratory next 3 wheezes scattered rhonchi no rales appreciable heart regular rhythm no S3-S4 no heaves thrills rubs abdomen soft nontender bowel sounds normoactive   Investigations:  Recent Results (from the past 240 hour(s))  Blood culture (routine x 2)     Status: None   Collection Time: 10/19/15  8:00 PM  Result Value Ref Range Status   Specimen Description BLOOD RIGHT ARM  Final   Special Requests BOTTLES DRAWN AEROBIC AND ANAEROBIC 6CC  Final   Culture NO GROWTH 5 DAYS  Final   Report Status 10/24/2015 FINAL  Final  Urine culture     Status: None   Collection Time: 10/19/15  8:05 PM  Result Value Ref Range Status   Specimen Description URINE, CLEAN CATCH  Final   Special Requests Normal  Final   Culture   Final    >=100,000 COLONIES/mL ESCHERICHIA COLI Per[facility]    Report Status 10/22/2015 FINAL  Final   Organism ID, Bacteria ESCHERICHIA COLI  Final      Susceptibility   Escherichia coli - MIC*    AMPICILLIN >=32 RESISTANT Resistant     CEFAZOLIN <=4 SENSITIVE Sensitive     CEFTRIAXONE <=1 SENSITIVE Sensitive     CIPROFLOXACIN 0.5 SENSITIVE Sensitive     GENTAMICIN <=1 SENSITIVE Sensitive     IMIPENEM <=0.25 SENSITIVE Sensitive     NITROFURANTOIN <=16 SENSITIVE Sensitive     TRIMETH/SULFA 160 RESISTANT Resistant     AMPICILLIN/SULBACTAM >=32 RESISTANT Resistant     PIP/TAZO <=4 SENSITIVE Sensitive     * >=100,000 COLONIES/mL ESCHERICHIA COLI  Blood culture (routine x 2)     Status: None   Collection  Time: 10/19/15  8:12 PM  Result Value Ref Range Status   Specimen Description BLOOD RIGHT HAND  Final   Special Requests BOTTLES DRAWN AEROBIC AND ANAEROBIC 6CC  Final   Culture NO GROWTH 5 DAYS  Final   Report Status 10/24/2015 FINAL  Final  MRSA PCR Screening     Status: None   Collection Time: 10/20/15 12:15 PM  Result Value Ref Range Status   MRSA by PCR NEGATIVE NEGATIVE Final    Comment:        The GeneXpert MRSA Assay (FDA approved for NASAL specimens only), is one component of a comprehensive MRSA colonization surveillance program. It is not intended to diagnose MRSA infection nor to guide or monitor treatment for MRSA infections.      Basic Metabolic Panel:  Recent Labs  95/62/1302/11/07 0544 10/26/15 0607  NA 135 136  K 3.5 3.6  CL 100* 103  CO2 27 26  GLUCOSE 210* 182*  BUN 17 15  CREATININE 0.41* <0.30*  CALCIUM 7.4* 7.6*   Liver Function Tests: No results for input(s): AST, ALT, ALKPHOS, BILITOT, PROT, ALBUMIN in the last 72 hours.   CBC:  Recent Labs  10/23/15 1240 10/24/15 0457  WBC 15.0* 13.4*  NEUTROABS 14.1* 12.4*  HGB 10.8* 11.2*  HCT 33.0* 34.3*  MCV 75.9* 75.1*  PLT 240 237  No results found.    Medications:   Impression:  Active Problems:   Chronic systolic CHF (congestive heart failure) (HCC)   COPD (chronic obstructive pulmonary disease) (HCC)   A-fib (HCC)   HCAP (healthcare-associated pneumonia)   Pressure ulcer     Plan: Continue by mouth (prednisone and dual antibiotics we'll decide on discharge tomorrow   Consultants: Pulmonary   Procedures   Antibiotics: Vancomycin and Maxipime                  Code Status:  Family Communication:    Disposition Plan   Time spent: 30 minutes   LOS: 7 days   Wong Steadham M   10/26/2015, 12:24 PM

## 2015-10-26 NOTE — Progress Notes (Signed)
She is apparently being prepared for discharge today. She will need a total of 10 days of antibiotics but I don't think it all has to be IV. Agree with starting prednisone. If she is switched to oral antibiotics Augmentin would probably be the best choice. She needs chest x-ray in about 6 weeks to document clearing of the infiltrate.   I will sign off at this point. Thanks for allowing me to see her with you

## 2015-10-26 NOTE — Progress Notes (Signed)
LCSW following patient. Patient is holding for discharge as she is not medically stable. MD aware to sign Fl2.  Patient is scheduled to follow up with SNF: [facility].    No needs today.  Will follow up and assist with disposition.  Deretha EmoryHannah Olson Lucarelli LCSW, MSW Clinical Social Work: [facility] [employer] [phone]

## 2015-10-27 ENCOUNTER — Encounter: Payer: Self-pay | Admitting: Internal Medicine

## 2015-10-27 ENCOUNTER — Non-Acute Institutional Stay (SKILLED_NURSING_FACILITY): Payer: Medicare Other | Admitting: Internal Medicine

## 2015-10-27 DIAGNOSIS — J189 Pneumonia, unspecified organism: Secondary | ICD-10-CM

## 2015-10-27 DIAGNOSIS — L8990 Pressure ulcer of unspecified site, unspecified stage: Secondary | ICD-10-CM | POA: Diagnosis not present

## 2015-10-27 DIAGNOSIS — N390 Urinary tract infection, site not specified: Secondary | ICD-10-CM

## 2015-10-27 DIAGNOSIS — B370 Candidal stomatitis: Secondary | ICD-10-CM

## 2015-10-27 LAB — BASIC METABOLIC PANEL
Anion gap: 7 (ref 5–15)
BUN: 12 mg/dL (ref 6–20)
CALCIUM: 7.4 mg/dL — AB (ref 8.9–10.3)
CO2: 26 mmol/L (ref 22–32)
Chloride: 103 mmol/L (ref 101–111)
GLUCOSE: 107 mg/dL — AB (ref 65–99)
Potassium: 4.1 mmol/L (ref 3.5–5.1)
Sodium: 136 mmol/L (ref 135–145)

## 2015-10-27 LAB — GLUCOSE, CAPILLARY
Glucose-Capillary: 119 mg/dL — ABNORMAL HIGH (ref 65–99)
Glucose-Capillary: 146 mg/dL — ABNORMAL HIGH (ref 65–99)

## 2015-10-27 MED ORDER — AMOXICILLIN-POT CLAVULANATE 875-125 MG PO TABS: 1.0000 | ORAL_TABLET | Freq: Two times a day (BID) | ORAL | Status: DC

## 2015-10-27 MED ORDER — PREDNISONE 50 MG PO TABS: 50.0000 mg | ORAL_TABLET | Freq: Every day | ORAL | Status: DC

## 2015-10-27 NOTE — Clinical Social Work Note (Signed)
CS[facility] that [facility] was being discharged today and would be transported to the facility.   CSW notified patient's daughter, Ms. Janee Mornhompson, via voicemail that patient was being discharged and would be transported to the facility via staff.  CSW sent clinicals via [employer].  CSW signing off.    Annice NeedySettle, Jaleesa Cervi D, Kentucky[place]CS[place] [phone]

## 2015-10-27 NOTE — Patient Instructions (Signed)
See summary under each active problem in the Problem List with associated updated therapeutic plan. Completed document transmitted to [facility]. 

## 2015-10-27 NOTE — Plan of Care (Signed)
Problem: Health Behavior/Discharge Planning: Goal: Ability to manage health-related needs will improve Outcome: Not Progressing Discharged to skilled facility

## 2015-10-27 NOTE — Assessment & Plan Note (Signed)
Augmentin until 4/11   prednisone 50 mg till 4/13

## 2015-10-27 NOTE — Assessment & Plan Note (Addendum)
Status post vancomycin and Maxipime for right lower lobe pneumonia Discharged on Augmentin Pharmacy consult to assess whether Escherichia coli was adequately addressed with the vancomycin and Maxipime prescribed for presumed pneumonia

## 2015-10-27 NOTE — Discharge Summary (Signed)
Physician Discharge Summary  Tara Park YNW:[id number] DOB: 04-21-1946 DOA: 10/19/2015  PCP: Isabella Stalling, MD  Admit date: 10/19/2015 Discharge date: 10/27/2015   Recommendations for Outpatient Follow-up:  Patient is discharged back to skilled nursing facility with the advice to take Augmentin 875-125 by mouth twice a day for an additional 5 days as well as prednisone 50 mg by mouth daily for 7 days for residual bronchospasm from hospital-acquired pneumonia she will require a chest x-ray in 4-6 weeks to document resolution Discharge Diagnoses:  Active Problems:   Chronic systolic CHF (congestive heart failure) (HCC)   COPD (chronic obstructive pulmonary disease) (HCC)   A-fib (HCC)   HCAP (healthcare-associated pneumonia)   Pressure ulcer   Discharge Condition: Good and improving  Filed Weights   10/20/15 0800 10/20/15 1108  Weight: 146 lb 9.7 oz (66.5 kg) 151 lb 14.4 oz (68.9 kg)    History of present illness:  Patient with a history of severe underlying emphysema was diagnosed with H CAP with a component of bronchospasm abated placed on vancomycin as well as Maxipime and seen in consultation by pulmonology given IV Solu-Medrol 4. Of 4-5 days slow resolution of bronchospastic component or sooner total of 8-9 days of IV antibiotics pulmonary suggested adding Augmentin upon discharge 875-125 by mouth twice a day for an additional 5 days and she had been switched to oral prednisone for an additional 7 days then discontinue history of paroxysmal A. fib was in sinus rhythm throughout her entire hospital stay her 2-D echocardiogram reveals normal systolic function ejection fraction 60-65% she is frail and requires good pulmonary toilet nebulizer therapy which is ordered as well as monitoring of glucose on a daily basis due to the initiation of steroids she does have underlying diabetes which was previously well controlled  Hospital Course:  See history of present illness  above Procedures:    Consultations:  Pulmonology  Discharge Instructions  Discharge Instructions    Discharge instructions    Complete by:  As directed      Discharge patient    Complete by:  As directed             Medication List    STOP taking these medications        sertraline 50 MG tablet  Commonly known as:  ZOLOFT      TAKE these medications        albuterol (2.5 MG/3ML) 0.083% nebulizer solution  Commonly known as:  PROVENTIL  Take 2.5 mg by nebulization 3 (three) times daily. *may be given every 2 hours as needed for breathing/shortness of breath     amoxicillin-clavulanate 875-125 MG tablet  Commonly known as:  AUGMENTIN  Take 1 tablet by mouth 2 (two) times daily.     aspirin EC 81 MG tablet  Take 81 mg by mouth daily.     clopidogrel 75 MG tablet  Commonly known as:  PLAVIX  Take 1 tablet (75 mg total) by mouth daily with breakfast.     clotrimazole 1 % cream  Commonly known as:  LOTRIMIN  Apply 1 application topically 4 (four) times daily as needed (to corners of mouth/lips as needed).     digoxin 0.125 MG tablet  Commonly known as:  LANOXIN  Take 1 tablet (0.125 mg total) by mouth daily.     diltiazem 30 MG tablet  Commonly known as:  CARDIZEM  Take 1 tablet (30 mg total) by mouth 4 (four) times daily.  feeding supplement (PRO-STAT 64) Liqd  Take 30 mLs by mouth 2 (two) times daily.     fluticasone 44 MCG/ACT inhaler  Commonly known as:  FLOVENT HFA  Inhale 2 puffs into the lungs 2 (two) times daily.     furosemide 20 MG tablet  Commonly known as:  LASIX  Take 20 mg by mouth daily.     guaiFENesin 600 MG 12 hr tablet  Commonly known as:  MUCINEX  Take 1 tablet (600 mg total) by mouth 2 (two) times daily.     HYDROcodone-acetaminophen 5-325 MG tablet  Commonly known as:  NORCO  Take one tablet by mouth every 6 hours as needed for pain. Max APAP 3gm/24hrs from all sources     ipratropium-albuterol 0.5-2.5 (3) MG/3ML Soln   Commonly known as:  DUONEB  Take 3 mLs by nebulization 2 (two) times daily.     Melatonin 3 MG Tabs  Take 3 mg by mouth at bedtime.     metFORMIN 500 MG tablet  Commonly known as:  GLUCOPHAGE  Take 500 mg by mouth 2 (two) times daily with a meal.     nystatin 100000 UNIT/ML suspension  Commonly known as:  MYCOSTATIN  Take 5 mLs by mouth 4 (four) times daily.     OXYGEN  Inhale 2 L into the lungs daily.     potassium chloride 10 MEQ tablet  Commonly known as:  K-DUR,KLOR-CON  Take 10 mEq by mouth daily.     pravastatin 40 MG tablet  Commonly known as:  PRAVACHOL  Take 2 tablets (80 mg total) by mouth every evening.     predniSONE 50 MG tablet  Commonly known as:  DELTASONE  Take 1 tablet (50 mg total) by mouth daily with breakfast.     saccharomyces boulardii 250 MG capsule  Commonly known as:  FLORASTOR  Take 250 mg by mouth 2 (two) times daily.     senna-docusate 8.6-50 MG tablet  Commonly known as:  Senokot-S  Take 1 tablet by mouth at bedtime as needed for mild constipation.     traZODone 50 MG tablet  Commonly known as:  DESYREL  Take 1 tablet (50 mg total) by mouth at bedtime as needed for sleep.     trypsin-balsam-castor oil ointment  Commonly known as:  XENADERM  Apply 1 application topically daily. **Apply to coccyx, bilateral buttocls, and saccrum at every shift and as needed**       No Known Allergies    The results of significant diagnostics from this hospitalization (including imaging, microbiology, ancillary and laboratory) are listed below for reference.    Significant Diagnostic Studies: Dg Chest Portable 1 View  10/19/2015  CLINICAL DATA:  Cough and shortness of breath EXAM: PORTABLE CHEST 1 VIEW COMPARISON:  08/30/2015 chest radiograph. FINDINGS: Stable cardiomediastinal silhouette with normal heart size. No pneumothorax. No pleural effusion. Emphysema. Stable reticular opacities at both lung apices, in keeping with mild pleural-parenchymal  scarring. No pulmonary edema. New mild patchy opacity at the right lung base. IMPRESSION: Emphysema. New mild patchy opacity at the right lung base, favor atelectasis, cannot entirely exclude a mild/ developing pneumonia. Electronically Signed   By: Delbert Phenix M.D.   On: 10/19/2015 18:10    Microbiology: Recent Results (from the past 240 hour(s))  Blood culture (routine x 2)     Status: None   Collection Time: 10/19/15  8:00 PM  Result Value Ref Range Status   Specimen Description BLOOD RIGHT ARM  Final  Special Requests BOTTLES DRAWN AEROBIC AND ANAEROBIC 6CC  Final   Culture NO GROWTH 5 DAYS  Final   Report Status 10/24/2015 FINAL  Final  Urine culture     Status: None   Collection Time: 10/19/15  8:05 PM  Result Value Ref Range Status   Specimen Description URINE, CLEAN CATCH  Final   Special Requests Normal  Final   Culture   Final    >=100,000 COLONIES/mL [facility]ICHIA COLI [facility]    Report Status 10/22/2015 FINAL  Final   Organism ID, Bacteria ESCHERICHIA COLI  Final      Susceptibility   Escherichia coli - MIC*    AMPICILLIN >=32 RESISTANT Resistant     CEFAZOLIN <=4 SENSITIVE Sensitive     CEFTRIAXONE <=1 SENSITIVE Sensitive     CIPROFLOXACIN 0.5 SENSITIVE Sensitive     GENTAMICIN <=1 SENSITIVE Sensitive     IMIPENEM <=0.25 SENSITIVE Sensitive     NITROFURANTOIN <=16 SENSITIVE Sensitive     TRIMETH/SULFA 160 RESISTANT Resistant     AMPICILLIN/SULBACTAM >=32 RESISTANT Resistant     PIP/TAZO <=4 SENSITIVE Sensitive     * >=100,000 COLONIES/mL ESCHERICHIA COLI  Blood culture (routine x 2)     Status: None   Collection Time: 10/19/15  8:12 PM  Result Value Ref Range Status   Specimen Description BLOOD RIGHT HAND  Final   Special Requests BOTTLES DRAWN AEROBIC AND ANAEROBIC 6CC  Final   Culture NO GROWTH 5 DAYS  Final   Report Status 10/24/2015 FINAL  Final  MRSA PCR Screening     Status: None   Collection Time: 10/20/15 12:15 PM  Result  Value Ref Range Status   MRSA by PCR NEGATIVE NEGATIVE Final    Comment:        The GeneXpert MRSA Assay (FDA approved for NASAL specimens only), is one component of a comprehensive MRSA colonization surveillance program. It is not intended to diagnose MRSA infection nor to guide or monitor treatment for MRSA infections.      Labs: Basic Metabolic Panel:  Recent Labs Lab 10/23/15 0640 10/24/15 0457 10/25/15 0544 10/26/15 0607 10/27/15 0529  NA 135 137 135 136 136  K 3.1* 2.7* 3.5 3.6 4.1  CL 96* 96* 100* 103 103  CO2 32 32 27 26 26   GLUCOSE 182* 168* 210* 182* 107*  BUN 18 20 17 15 12   CREATININE 0.44 0.46 0.41* <0.30* <0.30*  CALCIUM 7.4* 7.8* 7.4* 7.6* 7.4*   Liver Function Tests: No results for input(s): AST, ALT, ALKPHOS, BILITOT, PROT, ALBUMIN in the last 168 hours. No results for input(s): LIPASE, AMYLASE in the last 168 hours. No results for input(s): AMMONIA in the last 168 hours. CBC:  Recent Labs Lab 10/21/15 0644 10/23/15 1240 10/24/15 0457  WBC 20.5* 15.0* 13.4*  NEUTROABS 19.0* 14.1* 12.4*  HGB 10.4* 10.8* 11.2*  HCT 31.7* 33.0* 34.3*  MCV 75.3* 75.9* 75.1*  PLT 285 240 237   Cardiac Enzymes: No results for input(s): CKTOTAL, CKMB, CKMBINDEX, TROPONINI in the last 168 hours. BNP: BNP (last 3 results)  Recent Labs  06/11/15 0953 08/28/15 1740 10/19/15 1829  BNP 193.0* 131.0* 102.0*    ProBNP (last 3 results) No results for input(s): PROBNP in the last 8760 hours.  CBG:  Recent Labs Lab 10/26/15 1118 10/26/15 1634 10/26/15 2136 10/27/15 0725 10/27/15 1140  GLUCAP 199* 208* 150* 119* 146*       Signed:  Osiris Charles M  [facility] Pager: [phone] 10/27/2015,  12:43 PM

## 2015-10-27 NOTE — Progress Notes (Signed)
[facility]  All Questions answered

## 2015-10-27 NOTE — Assessment & Plan Note (Signed)
Pressure dressing and wound care discussed with  Wound Care Nurse. Heels elevated off the bed

## 2015-10-27 NOTE — Progress Notes (Signed)
[facility] within 30 days Interim medical record and care since last [facility] stay was updated with review of diagnostic studies and change in clinical status since last visit as documented.  Note: only family and social history pertinent to this assessment are included.  HPI: She was hospitalized 3/29-10/27/15 with health care associated pneumonia. Chest x-ray 3/29 revealed a patchy opacity at the right lung base suggesting atelectasis versus developing pneumonia superimposed on emphysema. Initially she received vancomycin as well as Maxipime. She was seen in consultation by pulmonology. She received parenteral steroids. There was a slow response of the bronchospasm to the aggressive pulmonary toilet. She was to continue nebulized treatments for the bronchospasm.  She was discharged on Augmentin 875/125 until 4/11 & prednisone 50 mg daily for 4/13. The latter was to treat residual bronchospasm. Follow-up chest x-ray was recommended in the  5/6-5/20 period.  Urine culture revealed over 100,000 colonies of Escherichia coli on 10/22/15. This was sensitive to cefazolin and ceftriaxone. Active medical issues include COPD; paroxysmal atrial fibrillation; chronic systolic congestive heart failure; and heel pressure ulcer. Despite a  history of paroxysmal atrial fibrillation she remained in sinus rhythm throughout hospital stay. Echocardiogram revealed an ejection fraction of 60-65 percent despite a history of chronic systolic heart failure. She is on metformin; glucoses ranged from 107-210 while hospitalized    ROS: The positive responses to the review of systems is of questionable reliability. She gave the date as 04/04/2011. She stated she quit smoking after her stroke in 1975. She describes herself as feeling "rough" due to shortness of breath. Constitutional: No fever, chills, significant weight change,  Eyes: No redness, discharge, pain, blurred vision,  double vision or loss of vision ENT/mouth: No nasal congestion, postnasal drainage,epistaxis, purulent discharge, earache, hearing loss, tinnitus ,sore throat    Cardiovascular: no chest pain, palpitations, racing, irregular rhythm, syncope, nausea, sweating Gastrointestinal: No heartburn,dysphagia, nausea and vomiting,abdominal pain, change in bowels, anorexia, diarrhea, significant constipation, rectal bleeding, melena,  stool incontinence or jaundice Genitourinary: No dysuria,hematuria, pyuria, frequency, urgency,  incontinence, nocturia, dark urine or flank pain despite + urine culture Neurologic: No headache, vertigo.Pain in both legs w/o numbness and tingling    Pertinent or positive findings: She appears chronically ill with pasty complexion. She has complete dentures. Oral thrush is present. There is respiratory variation to her heart rhythm. Heart sounds are loudest in the epigastrium. She has low-gradeexpiratory rhonchi scattered in an irregular distribution. Pedal pulses are decreased, especially posterior tibial pulses. She has 1+ pitting edema. There are flexion changes of the toes of the right foot. A flexion contracture of the left hand is present. Irregular bruising is present over the upper extremities of the dorsum of the feet, especially the left. There is slight hyperreflexia in the left upper extremity. She is unable to lift the left leg. Multiple skin lesions present:  3 x 14 mm skin deficit over the right lateral malleolar area w/o associated cellulitis or purulence. There is a well-healed triangular-shaped lesion 13 x 34 mm over the right lateral calf. There is serous drainage from the left upper extremity without skin breakdown. There is a 7.5 x 2.5 cm area of erythema with serous drainage over the left heel. There is a 7 x 6 mm skin tag at the coccygeal area. She has ecchymosis and candidal dermatitis in the inguinal areas.    Lymphatic: No  lymphadenopathy about the head, neck,  or axilla . Eyes: No conjunctival inflammation  or lid edema is present. There is no scleral icterus. Ears:  External ear exam shows no significant lesions or deformities.   Nose:  External nasal examination shows no deformity or inflammation. Nasal mucosa are pink and moist without lesions or exudates No septal dislocation or deviation.No obstruction to airflow.  Neck:  No deformities, thyromegaly, masses, or tenderness noted.   Heart:  Normal rate .S1 and S2 normal without gallop, murmur, click, rub or other extra sounds.  Abdomen:Bowel sounds are normal. Abdomen is soft and nontender with no organomegaly, hernias  or masses. GU: deferred as previously addressed. Extremities:  No cyanosis  or definite clubbing noted. Neurologic exam :  See summary under each active problem in the Problem List with associated updated therapeutic plan

## 2015-10-27 NOTE — Assessment & Plan Note (Signed)
Nystatin protocol  Wean prednisone as COPD and post bronchitic bronchospasm allows

## 2015-10-27 NOTE — Care Management Important Message (Signed)
Important Message  Patient Details  Name: Tara GuyJane F Park MRN: [id number] Date of Birth: January 26, 1946   Medicare Important Message Given:  Yes    Adonis HugueninBerkhead, Tinika Bucknam L, RN 10/27/2015, 12:55 PM

## 2015-10-27 NOTE — Assessment & Plan Note (Signed)
D/Ced on high dose prednisone Monitor glucoses

## 2015-10-28 ENCOUNTER — Other Ambulatory Visit: Payer: Self-pay | Admitting: *Deleted

## 2015-10-28 ENCOUNTER — Encounter (HOSPITAL_COMMUNITY)
Admission: AD | Admit: 2015-10-28 | Discharge: 2015-10-28 | Disposition: A | Discharge status: Home / Self Care | Payer: Medicare Other | Source: Skilled Nursing Facility | Attending: Internal Medicine | Admitting: Internal Medicine

## 2015-10-28 DIAGNOSIS — M6281 Muscle weakness (generalized): Secondary | ICD-10-CM | POA: Insufficient documentation

## 2015-10-28 DIAGNOSIS — J9611 Chronic respiratory failure with hypoxia: Secondary | ICD-10-CM | POA: Insufficient documentation

## 2015-10-28 DIAGNOSIS — J189 Pneumonia, unspecified organism: Secondary | ICD-10-CM | POA: Insufficient documentation

## 2015-10-28 DIAGNOSIS — N390 Urinary tract infection, site not specified: Secondary | ICD-10-CM | POA: Insufficient documentation

## 2015-10-28 DIAGNOSIS — L89622 Pressure ulcer of left heel, stage 2: Secondary | ICD-10-CM | POA: Insufficient documentation

## 2015-10-28 LAB — URINALYSIS, ROUTINE W REFLEX MICROSCOPIC
Bilirubin Urine: NEGATIVE
Glucose, UA: NEGATIVE mg/dL
Hgb urine dipstick: NEGATIVE
Ketones, ur: NEGATIVE mg/dL
Leukocytes, UA: NEGATIVE
Nitrite: NEGATIVE
PH: 7.5 (ref 5.0–8.0)
Protein, ur: 30 mg/dL — AB
SPECIFIC GRAVITY, URINE: 1.01 (ref 1.005–1.030)

## 2015-10-28 LAB — URINE MICROSCOPIC-ADD ON

## 2015-10-28 MED ORDER — ACETAMINOPHEN-CODEINE #3 300-30 MG PO TABS: 1.0000 | ORAL_TABLET | Freq: Four times a day (QID) | ORAL | Status: DC | PRN

## 2015-10-28 NOTE — Telephone Encounter (Signed)
[facility]  

## 2015-10-30 LAB — URINE CULTURE: Culture: NO GROWTH

## 2015-11-03 ENCOUNTER — Other Ambulatory Visit: Payer: Self-pay | Admitting: Internal Medicine

## 2015-11-03 ENCOUNTER — Encounter: Payer: Self-pay | Admitting: Internal Medicine

## 2015-11-03 ENCOUNTER — Non-Acute Institutional Stay (SKILLED_NURSING_FACILITY): Payer: Medicare Other | Admitting: Internal Medicine

## 2015-11-03 ENCOUNTER — Other Ambulatory Visit (HOSPITAL_COMMUNITY)
Admission: AD | Admit: 2015-11-03 | Discharge: 2015-11-03 | Disposition: A | Discharge status: Home / Self Care | Payer: Medicare Other | Source: Skilled Nursing Facility | Attending: Internal Medicine | Admitting: Internal Medicine

## 2015-11-03 DIAGNOSIS — B370 Candidal stomatitis: Secondary | ICD-10-CM | POA: Diagnosis not present

## 2015-11-03 DIAGNOSIS — N390 Urinary tract infection, site not specified: Secondary | ICD-10-CM | POA: Diagnosis present

## 2015-11-03 DIAGNOSIS — R4182 Altered mental status, unspecified: Secondary | ICD-10-CM | POA: Insufficient documentation

## 2015-11-03 DIAGNOSIS — R410 Disorientation, unspecified: Secondary | ICD-10-CM

## 2015-11-03 LAB — URINALYSIS, ROUTINE W REFLEX MICROSCOPIC
Bilirubin Urine: NEGATIVE
GLUCOSE, UA: NEGATIVE mg/dL
Hgb urine dipstick: NEGATIVE
KETONES UR: NEGATIVE mg/dL
LEUKOCYTES UA: NEGATIVE
NITRITE: NEGATIVE
PROTEIN: NEGATIVE mg/dL
Specific Gravity, Urine: 1.015 (ref 1.005–1.030)
pH: 6 (ref 5.0–8.0)

## 2015-11-03 NOTE — Assessment & Plan Note (Signed)
Clinically improved.  

## 2015-11-03 NOTE — Assessment & Plan Note (Signed)
BMET CBC ; hepatic function; and urine for culture and sensitivity will be collected. Were this related to urinary tract infection I would expect her to be less alert and interactive than she is.  If these estimates are nondiagnostic; repeat the psychiatric evaluation.

## 2015-11-03 NOTE — Patient Instructions (Addendum)
See summary under each active problem in the Problem List with associated updated therapeutic plan. Completed document transmitted to [facility] with orders for Matrix entry: CBC and differential BMET Hepatic profile Urine C&S

## 2015-11-03 NOTE — Progress Notes (Signed)
Patient ID: Tara Park, female   DOB: Feb 04, 1946, 70 y.o.   MRN: [id number]     This is a nursing facility follow up for specific acute issue of increasing confusion. She was noted to use her television remote in attempt to order mashed potatoes and other food items. Interim medical record and care since last [facility] visit was updated with review of diagnostic studies and change in clinical status since last visit were documented.  Note: only family and social history pertinent to this assessment are included.  HPI: She had been seen for a psychiatric assessment 09/22/15. Major issues were insomnia and cognitive impairment. She was reported as yelling out all day long. This behavior is not present at this time. She was on trazodone 50 mg at bedtime;Zoloft  at that time was titrated to 50 mg daily for anxiety Most recent labs were 10/27/15. Creatinine was less than 0.30. She had a microcytic , microchromic anemia which appeared to be improving serially. Her hyperglycemia was also improving.  Comprehensive review of systems: Review of systems isn't reliable due to her confusion. She describes allergies but denies significant sneezing or other extrinsic symptoms. She has intermittent shortness of breath and intermittent scant yellow sputum. She describes right-sided weakness although the sequela of the stroke are on the left Constitutional: No fever,significant weight change Eyes: No redness, discharge, pain, vision change ENT/mouth: No nasal congestion,  purulent discharge, earache,change in hearing ,sore throat  Cardiovascular: No chest pain, palpitations,paroxysmal nocturnal dyspnea, claudication, edema  Gastrointestinal: No heartburn,dysphagia,abdominal pain, nausea / vomiting,rectal bleeding, melena,change in bowels Genitourinary: No dysuria,hematuria, pyuria,  incontinence, nocturia Musculoskeletal: No joint stiffness, joint swelling, pain Dermatologic: No rash, pruritus,  change in appearance of skin Neurologic: No dizziness,headache,syncope, seizures, numbness , tingling Psychiatric: No significant anxiety , depression, insomnia, anorexia Hematologic/lymphatic: No significant bruising, lymphadenopathy,abnormal bleeding Allergy/immunology: No itchy/ watery eyes, significant sneezing, urticaria, angioedema  Physical exam:  Pertinent or positive findings: As I entered the room she expressed concern about having her name changed. This was repeated at least 3 times. She appeared much more alert than at the time of admission. She has complete dentures. Oral candidiasis has improved significantly. She does not exhibit increased work of breathing but has coarse rales and rhonchi in all lung fields. Heart sounds are obscured. Edema has improved. There is wasting of the lower extremities. The left upper extremity is in a brace which for which she expressed appreciation. She is unable to raise her left upper or left lower extremity. Pedal pulses are decreased General appearance:Adequately nourished; no acute distress   Lymphatic: No lymphadenopathy about the head, neck, axilla . Eyes: No conjunctival inflammation or lid edema is present. There is no scleral icterus. Ears:  External ear exam shows no significant lesions or deformities.   Nose:  External nasal examination shows no deformity or inflammation. Nasal mucosa are pink and moist without lesions ,exudates Oral exam: lips and gums are healthy appearing. Neck:  No thyromegaly, masses, tenderness noted.    Abdomen:Bowel sounds are normal. Abdomen is soft and nontender with no organomegaly, hernias,masses. GU: deferred as previously addressed. Extremities:  No cyanosis, clubbing,edema  Neurologic exam : She said the date is 10/28/2010. Was able to give her date of birth. She also identified the president Cn 2-7 intact except for asymmetry of her smile with decrease on the left. She is wheelchair or  bedbound. Balance,Rhomberg,finger to nose testing could not be completed due to clinical state Deep tendon reflexes  1/2 +  plus in the right upper and right lower extremity. Skin: Warm & dry w/o tenting. Scattered bruising mainly over the dorsum of the hands. No significant lesions or rash. She does have her wounds dressed. By report there has been clinical improvement in the skin ulcers..    See summary under each active problem in the Problem List with associated updated therapeutic plan

## 2015-11-04 ENCOUNTER — Encounter (HOSPITAL_COMMUNITY)
Admission: RE | Admit: 2015-11-04 | Discharge: 2015-11-04 | Disposition: A | Discharge status: Home / Self Care | Payer: Medicare Other | Source: Skilled Nursing Facility | Attending: Internal Medicine | Admitting: Internal Medicine

## 2015-11-04 LAB — HEPATIC FUNCTION PANEL
ALK PHOS: 142 U/L — AB (ref 38–126)
ALT: 36 U/L (ref 14–54)
AST: 52 U/L — AB (ref 15–41)
Albumin: 3.1 g/dL — ABNORMAL LOW (ref 3.5–5.0)
BILIRUBIN DIRECT: 0.1 mg/dL (ref 0.1–0.5)
BILIRUBIN TOTAL: 0.5 mg/dL (ref 0.3–1.2)
Indirect Bilirubin: 0.4 mg/dL (ref 0.3–0.9)
Total Protein: 6.9 g/dL (ref 6.5–8.1)

## 2015-11-04 LAB — CBC WITH DIFFERENTIAL/PLATELET
BASOS ABS: 0 10*3/uL (ref 0.0–0.1)
Basophils Relative: 1 %
Eosinophils Absolute: 0.3 10*3/uL (ref 0.0–0.7)
Eosinophils Relative: 5 %
HEMATOCRIT: 34 % — AB (ref 36.0–46.0)
Hemoglobin: 11.2 g/dL — ABNORMAL LOW (ref 12.0–15.0)
LYMPHS PCT: 20 %
Lymphs Abs: 1.2 10*3/uL (ref 0.7–4.0)
MCH: 25.9 pg — ABNORMAL LOW (ref 26.0–34.0)
MCHC: 32.9 g/dL (ref 30.0–36.0)
MCV: 78.7 fL (ref 78.0–100.0)
MONO ABS: 0.8 10*3/uL (ref 0.1–1.0)
MONOS PCT: 13 %
NEUTROS ABS: 3.9 10*3/uL (ref 1.7–7.7)
NEUTROS PCT: 61 %
Platelets: 202 10*3/uL (ref 150–400)
RBC: 4.32 MIL/uL (ref 3.87–5.11)
RDW: 15.6 % — AB (ref 11.5–15.5)
WBC: 6.2 10*3/uL (ref 4.0–10.5)

## 2015-11-04 LAB — BASIC METABOLIC PANEL
ANION GAP: 7 (ref 5–15)
BUN: 28 mg/dL — ABNORMAL HIGH (ref 6–20)
CALCIUM: 8.6 mg/dL — AB (ref 8.9–10.3)
CO2: 25 mmol/L (ref 22–32)
Chloride: 102 mmol/L (ref 101–111)
Creatinine, Ser: 0.99 mg/dL (ref 0.44–1.00)
GFR calc Af Amer: >60 mL/min (ref >60)
GFR calc non Af Amer: 56 mL/min — ABNORMAL LOW (ref >60)
GLUCOSE: 212 mg/dL — AB (ref 65–99)
Potassium: 4.1 mmol/L (ref 3.5–5.1)
Sodium: 134 mmol/L — ABNORMAL LOW (ref 135–145)

## 2015-11-05 LAB — URINE CULTURE: Culture: NO GROWTH

## 2015-11-09 ENCOUNTER — Non-Acute Institutional Stay (SKILLED_NURSING_FACILITY): Payer: Medicare Other | Admitting: Internal Medicine

## 2015-11-09 ENCOUNTER — Encounter: Payer: Self-pay | Admitting: Internal Medicine

## 2015-11-09 DIAGNOSIS — F411 Generalized anxiety disorder: Secondary | ICD-10-CM | POA: Diagnosis not present

## 2015-11-09 DIAGNOSIS — J441 Chronic obstructive pulmonary disease with (acute) exacerbation: Secondary | ICD-10-CM | POA: Diagnosis not present

## 2015-11-10 ENCOUNTER — Other Ambulatory Visit: Payer: Self-pay | Admitting: *Deleted

## 2015-11-10 MED ORDER — LORAZEPAM 0.5 MG PO TABS: ORAL_TABLET | ORAL | Status: DC

## 2015-11-11 ENCOUNTER — Encounter: Payer: Self-pay | Admitting: Internal Medicine

## 2015-11-11 ENCOUNTER — Non-Acute Institutional Stay (SKILLED_NURSING_FACILITY): Payer: Medicare Other | Admitting: Internal Medicine

## 2015-11-11 DIAGNOSIS — E099 Drug or chemical induced diabetes mellitus without complications: Secondary | ICD-10-CM | POA: Diagnosis not present

## 2015-11-11 DIAGNOSIS — B370 Candidal stomatitis: Secondary | ICD-10-CM | POA: Diagnosis not present

## 2015-11-11 DIAGNOSIS — F411 Generalized anxiety disorder: Secondary | ICD-10-CM

## 2015-11-11 DIAGNOSIS — T380X5A Adverse effect of glucocorticoids and synthetic analogues, initial encounter: Secondary | ICD-10-CM

## 2015-11-11 DIAGNOSIS — T380X1A Poisoning by glucocorticoids and synthetic analogues, accidental (unintentional), initial encounter: Secondary | ICD-10-CM

## 2015-11-11 DIAGNOSIS — J411 Mucopurulent chronic bronchitis: Secondary | ICD-10-CM

## 2015-11-11 NOTE — Assessment & Plan Note (Signed)
Clinically improved  Prednisone will be weaned  She should gargle and spit after using Flovent

## 2015-11-11 NOTE — Patient Instructions (Signed)
orders for Matrix entry Decrease prednisone to 20 mg 1.5 pills daily 5 days; then 1 pill daily thereafter Gargle and spit after using Flovent to help prevent oral candidiasis Singulair 10 mg daily

## 2015-11-11 NOTE — Progress Notes (Signed)
Patient ID: Tara GuyJane F Steenbergen, female   DOB: July 04, 1946, 70 y.o.   MRN: [id number] This is a nursing facility follow up for specific chronic medical diagnoses  Interim medical record and care since last [facility] visit was updated with review of diagnostic studies and change in clinical status since last visit were documented.  HPI: She has advanced COPD for which she is receiving continuous oxygen; bronchodilators and inhaled steroids. She's also on high-dose prednisone. The steroids have exacerbated hyperglycemia; hemoglobin A1c is 6.9%. She is profoundly debilitated and has decubital ulcers. She is unable to move the left upper left lower extremities predisposing her to pressure ulcers. She has developed oral candidiasis on the prednisone and inhaled steroids.   Comprehensive review of systems: Active complaints consist of chronic shortness of breath. 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 She gave the date as February 08, 1916. Constitutional: No fever,significant weight change, fatigue  Eyes: No redness, discharge, pain, vision change ENT/mouth: No nasal congestion,  purulent discharge, earache,change in hearing ,sore throat  Cardiovascular: No chest pain, palpitations,paroxysmal nocturnal dyspnea, claudication, edema  Gastrointestinal: No heartburn,dysphagia,abdominal pain, nausea / vomiting,rectal bleeding, melena,change in bowels Genitourinary: No dysuria,hematuria, pyuria,  incontinence, nocturia Musculoskeletal: No joint stiffness, joint swelling, weakness,pain Dermatologic: No rash, pruritus, change in appearance of skin Neurologic: No dizziness,headache,syncope, seizures, numbness , tingling Psychiatric: Chronic anxiety  Endocrine: No change in hair/skin/ nails, excessive thirst, excessive hunger, excessive  urination  Hematologic/lymphatic: No significant lmphadenopathy,abnormal bleeding Allergy/immunology: No itchy/ watery eyes, significant sneezing, urticaria, angioedema presently  Physical exam:  Pertinent or positive findings:She is confused as noted above. She appears frail and chronically ill. She has bilateral ptosis. Tongue is beefy red ; oral candidiasis is not definitely present at this time.  She has low grade expiratory rhonchi in all lung fields Heart sounds are markedly distant and difficult to auscultate. She has had definite improvement in the edema of the lower extremities  The pedal pulses are decreased particularly on the right. The right ankle appears fusiformly enlarged Scattered bruises especially of the left upper extremity and right lower extremity. She is unable to lift the left upper or left lower extremities. Her legs are elevated on soft cushions. Ulcers are dressed.  General appearance:Adequately nourished; no acute distress  Lymphatic: No lymphadenopathy about the head, neck, axilla . Eyes: No conjunctival inflammation or lid edema is present. There is no scleral icterus. Ears:  External ear exam shows no significant lesions or deformities.   Nose:  External nasal examination shows no deformity or inflammation. Nasal mucosa are pink and moist without lesions ,exudates Oral exam: lips and gums are healthy appearing. Neck:  No thyromegaly, masses, tenderness noted.    Abdomen:Bowel sounds are normal. Abdomen is soft and nontender with no organomegaly, hernias,masses. GU: deferred . Extremities:  No cyanosis  Neurologic exam : Strength decreased on R in upper & lower extremities Balance,Rhomberg,finger to nose testing could not be completed due to clinical state Skin: Warm & dry w/o tenting.  See summary under each active problem in the Problem List with associated updated therapeutic plan

## 2015-11-11 NOTE — Assessment & Plan Note (Signed)
With respiratory compromise I would hesitate to increase the tranquilizer dose

## 2015-11-11 NOTE — Assessment & Plan Note (Signed)
Wean prednisone

## 2015-11-11 NOTE — Assessment & Plan Note (Signed)
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

## 2015-11-13 ENCOUNTER — Inpatient Hospital Stay (HOSPITAL_COMMUNITY)
Admission: EM | Admit: 2015-11-13 | Discharge: 2015-11-18 | DRG: 871 | Disposition: A | Discharge status: Skilled Nursing Facility | Payer: Medicare Other | Attending: Family Medicine | Admitting: Family Medicine

## 2015-11-13 ENCOUNTER — Encounter (HOSPITAL_COMMUNITY): Payer: Self-pay | Admitting: Emergency Medicine

## 2015-11-13 ENCOUNTER — Inpatient Hospital Stay (HOSPITAL_COMMUNITY): Payer: Medicare Other

## 2015-11-13 ENCOUNTER — Emergency Department (HOSPITAL_COMMUNITY): Payer: Medicare Other

## 2015-11-13 DIAGNOSIS — Y95 Nosocomial condition: Secondary | ICD-10-CM | POA: Diagnosis present

## 2015-11-13 DIAGNOSIS — Z8249 Family history of ischemic heart disease and other diseases of the circulatory system: Secondary | ICD-10-CM

## 2015-11-13 DIAGNOSIS — E785 Hyperlipidemia, unspecified: Secondary | ICD-10-CM | POA: Diagnosis present

## 2015-11-13 DIAGNOSIS — J189 Pneumonia, unspecified organism: Secondary | ICD-10-CM | POA: Diagnosis present

## 2015-11-13 DIAGNOSIS — I2489 Other forms of acute ischemic heart disease: Secondary | ICD-10-CM | POA: Diagnosis present

## 2015-11-13 DIAGNOSIS — IMO0002 Reserved for concepts with insufficient information to code with codable children: Secondary | ICD-10-CM | POA: Diagnosis present

## 2015-11-13 DIAGNOSIS — R0602 Shortness of breath: Secondary | ICD-10-CM | POA: Diagnosis present

## 2015-11-13 DIAGNOSIS — Z9981 Dependence on supplemental oxygen: Secondary | ICD-10-CM | POA: Diagnosis not present

## 2015-11-13 DIAGNOSIS — Z7984 Long term (current) use of oral hypoglycemic drugs: Secondary | ICD-10-CM | POA: Diagnosis not present

## 2015-11-13 DIAGNOSIS — J411 Mucopurulent chronic bronchitis: Secondary | ICD-10-CM | POA: Diagnosis not present

## 2015-11-13 DIAGNOSIS — J410 Simple chronic bronchitis: Secondary | ICD-10-CM

## 2015-11-13 DIAGNOSIS — Z8673 Personal history of transient ischemic attack (TIA), and cerebral infarction without residual deficits: Secondary | ICD-10-CM

## 2015-11-13 DIAGNOSIS — Z87891 Personal history of nicotine dependence: Secondary | ICD-10-CM

## 2015-11-13 DIAGNOSIS — Z825 Family history of asthma and other chronic lower respiratory diseases: Secondary | ICD-10-CM | POA: Diagnosis not present

## 2015-11-13 DIAGNOSIS — I248 Other forms of acute ischemic heart disease: Secondary | ICD-10-CM

## 2015-11-13 DIAGNOSIS — Z7902 Long term (current) use of antithrombotics/antiplatelets: Secondary | ICD-10-CM | POA: Diagnosis not present

## 2015-11-13 DIAGNOSIS — I10 Essential (primary) hypertension: Secondary | ICD-10-CM | POA: Diagnosis present

## 2015-11-13 DIAGNOSIS — Z7982 Long term (current) use of aspirin: Secondary | ICD-10-CM

## 2015-11-13 DIAGNOSIS — E1100 Type 2 diabetes mellitus with hyperosmolarity without nonketotic hyperglycemic-hyperosmolar coma (NKHHC): Secondary | ICD-10-CM

## 2015-11-13 DIAGNOSIS — Z7951 Long term (current) use of inhaled steroids: Secondary | ICD-10-CM

## 2015-11-13 DIAGNOSIS — F411 Generalized anxiety disorder: Secondary | ICD-10-CM

## 2015-11-13 DIAGNOSIS — I110 Hypertensive heart disease with heart failure: Secondary | ICD-10-CM | POA: Diagnosis present

## 2015-11-13 DIAGNOSIS — A419 Sepsis, unspecified organism: Secondary | ICD-10-CM | POA: Diagnosis present

## 2015-11-13 DIAGNOSIS — J449 Chronic obstructive pulmonary disease, unspecified: Secondary | ICD-10-CM | POA: Diagnosis present

## 2015-11-13 DIAGNOSIS — N10 Acute tubulo-interstitial nephritis: Secondary | ICD-10-CM

## 2015-11-13 DIAGNOSIS — J440 Chronic obstructive pulmonary disease with acute lower respiratory infection: Secondary | ICD-10-CM | POA: Diagnosis present

## 2015-11-13 DIAGNOSIS — E876 Hypokalemia: Secondary | ICD-10-CM

## 2015-11-13 DIAGNOSIS — F419 Anxiety disorder, unspecified: Secondary | ICD-10-CM | POA: Diagnosis present

## 2015-11-13 DIAGNOSIS — J9621 Acute and chronic respiratory failure with hypoxia: Secondary | ICD-10-CM | POA: Diagnosis present

## 2015-11-13 DIAGNOSIS — J9620 Acute and chronic respiratory failure, unspecified whether with hypoxia or hypercapnia: Secondary | ICD-10-CM | POA: Diagnosis present

## 2015-11-13 DIAGNOSIS — R0902 Hypoxemia: Secondary | ICD-10-CM

## 2015-11-13 DIAGNOSIS — E1165 Type 2 diabetes mellitus with hyperglycemia: Secondary | ICD-10-CM

## 2015-11-13 DIAGNOSIS — E118 Type 2 diabetes mellitus with unspecified complications: Secondary | ICD-10-CM

## 2015-11-13 DIAGNOSIS — I509 Heart failure, unspecified: Secondary | ICD-10-CM | POA: Diagnosis present

## 2015-11-13 LAB — GLUCOSE, CAPILLARY: GLUCOSE-CAPILLARY: 131 mg/dL — AB (ref 65–99)

## 2015-11-13 LAB — CBC
HEMATOCRIT: 35.3 % — AB (ref 36.0–46.0)
HEMOGLOBIN: 11.4 g/dL — AB (ref 12.0–15.0)
MCH: 24.4 pg — AB (ref 26.0–34.0)
MCHC: 32.3 g/dL (ref 30.0–36.0)
MCV: 75.6 fL — AB (ref 78.0–100.0)
PLATELETS: 165 10*3/uL (ref 150–400)
RBC: 4.67 MIL/uL (ref 3.87–5.11)
RDW: 18.2 % — ABNORMAL HIGH (ref 11.5–15.5)
WBC: 13.5 10*3/uL — ABNORMAL HIGH (ref 4.0–10.5)

## 2015-11-13 LAB — LACTIC ACID, PLASMA
Lactic Acid, Venous: 2.6 mmol/L (ref 0.5–2.0)
Lactic Acid, Venous: 2 mmol/L (ref 0.5–2.0)
Lactic Acid, Venous: 2.9 mmol/L (ref 0.5–2.0)

## 2015-11-13 LAB — PROCALCITONIN: Procalcitonin: 14.91 ng/mL

## 2015-11-13 LAB — COMPREHENSIVE METABOLIC PANEL
ALK PHOS: 506 U/L — AB (ref 38–126)
ALT: 75 U/L — AB (ref 14–54)
AST: 75 U/L — AB (ref 15–41)
Albumin: 2.1 g/dL — ABNORMAL LOW (ref 3.5–5.0)
Anion gap: 9 (ref 5–15)
BUN: 18 mg/dL (ref 6–20)
CALCIUM: 7.8 mg/dL — AB (ref 8.9–10.3)
CHLORIDE: 97 mmol/L — AB (ref 101–111)
CO2: 33 mmol/L — AB (ref 22–32)
CREATININE: 0.35 mg/dL — AB (ref 0.44–1.00)
GFR calc Af Amer: >60 mL/min (ref >60)
GFR calc non Af Amer: >60 mL/min (ref >60)
GLUCOSE: 120 mg/dL — AB (ref 65–99)
Potassium: 3.7 mmol/L (ref 3.5–5.1)
SODIUM: 139 mmol/L (ref 135–145)
Total Bilirubin: 0.9 mg/dL (ref 0.3–1.2)
Total Protein: 5.3 g/dL — ABNORMAL LOW (ref 6.5–8.1)

## 2015-11-13 LAB — PROTIME-INR
INR: 1.2 (ref 0.00–1.49)
Prothrombin Time: 15.4 seconds — ABNORMAL HIGH (ref 11.6–15.2)

## 2015-11-13 LAB — BRAIN NATRIURETIC PEPTIDE: B NATRIURETIC PEPTIDE 5: 129 pg/mL — AB (ref 0.0–100.0)

## 2015-11-13 LAB — TROPONIN I
Troponin I: 0.05 ng/mL — ABNORMAL HIGH (ref <0.031)
Troponin I: 0.04 ng/mL — ABNORMAL HIGH (ref <0.031)

## 2015-11-13 MED ORDER — TRAZODONE HCL 50 MG PO TABS
50.0000 mg | ORAL_TABLET | Freq: Every evening | ORAL | Status: DC | PRN
  Administered 2015-11-13 – 2015-11-17 (×4): 50 mg via ORAL
  Filled 2015-11-13 (×4): qty 1

## 2015-11-13 MED ORDER — GUAIFENESIN ER 600 MG PO TB12
1200.0000 mg | ORAL_TABLET | Freq: Two times a day (BID) | ORAL | Status: DC
  Administered 2015-11-14 – 2015-11-18 (×9): 1200 mg via ORAL
  Filled 2015-11-13 (×10): qty 2

## 2015-11-13 MED ORDER — SENNA 8.6 MG PO TABS
1.0000 | ORAL_TABLET | Freq: Every evening | ORAL | Status: DC | PRN
  Administered 2015-11-14: 8.6 mg via ORAL
  Filled 2015-11-13: qty 1

## 2015-11-13 MED ORDER — BUDESONIDE 0.25 MG/2ML IN SUSP
0.2500 mg | Freq: Two times a day (BID) | RESPIRATORY_TRACT | Status: DC
  Administered 2015-11-13 – 2015-11-18 (×10): 0.25 mg via RESPIRATORY_TRACT
  Filled 2015-11-13 (×10): qty 2

## 2015-11-13 MED ORDER — VANCOMYCIN HCL IN DEXTROSE 750-5 MG/150ML-% IV SOLN
750.0000 mg | Freq: Two times a day (BID) | INTRAVENOUS | Status: DC
  Administered 2015-11-13 – 2015-11-17 (×8): 750 mg via INTRAVENOUS
  Filled 2015-11-13 (×16): qty 150

## 2015-11-13 MED ORDER — PREDNISONE 20 MG PO TABS
20.0000 mg | ORAL_TABLET | Freq: Every day | ORAL | Status: DC
  Administered 2015-11-14 – 2015-11-18 (×5): 20 mg via ORAL
  Filled 2015-11-13 (×5): qty 1

## 2015-11-13 MED ORDER — DIGOXIN 125 MCG PO TABS: 0.1250 mg | ORAL_TABLET | Freq: Every day | ORAL | Status: DC

## 2015-11-13 MED ORDER — SACCHAROMYCES BOULARDII 250 MG PO CAPS
250.0000 mg | ORAL_CAPSULE | Freq: Two times a day (BID) | ORAL | Status: DC
  Administered 2015-11-13 – 2015-11-18 (×10): 250 mg via ORAL
  Filled 2015-11-13 (×10): qty 1

## 2015-11-13 MED ORDER — DEXTROSE 5 % IV SOLN
1.0000 g | Freq: Three times a day (TID) | INTRAVENOUS | Status: DC
  Filled 2015-11-13 (×3): qty 1

## 2015-11-13 MED ORDER — DILTIAZEM HCL 30 MG PO TABS
30.0000 mg | ORAL_TABLET | Freq: Four times a day (QID) | ORAL | Status: DC
  Administered 2015-11-13 – 2015-11-18 (×18): 30 mg via ORAL
  Filled 2015-11-13 (×18): qty 1

## 2015-11-13 MED ORDER — VANCOMYCIN HCL IN DEXTROSE 1-5 GM/200ML-% IV SOLN
1000.0000 mg | Freq: Once | INTRAVENOUS | Status: AC
  Administered 2015-11-13: 1000 mg via INTRAVENOUS
  Filled 2015-11-13: qty 200

## 2015-11-13 MED ORDER — SODIUM CHLORIDE 0.9 % IV SOLN
INTRAVENOUS | Status: DC
  Administered 2015-11-13 – 2015-11-17 (×5): via INTRAVENOUS

## 2015-11-13 MED ORDER — INSULIN ASPART 100 UNIT/ML ~~LOC~~ SOLN
0.0000 [IU] | Freq: Three times a day (TID) | SUBCUTANEOUS | Status: DC
Start: 2015-11-13 — End: 2015-11-18
  Administered 2015-11-13 – 2015-11-15 (×2): 2 [IU] via SUBCUTANEOUS
  Administered 2015-11-17: 3 [IU] via SUBCUTANEOUS
  Administered 2015-11-17: 8 [IU] via SUBCUTANEOUS

## 2015-11-13 MED ORDER — IPRATROPIUM-ALBUTEROL 0.5-2.5 (3) MG/3ML IN SOLN
3.0000 mL | Freq: Once | RESPIRATORY_TRACT | Status: AC
Start: 2015-11-13 — End: 2015-11-13
  Administered 2015-11-13: 3 mL via RESPIRATORY_TRACT
  Filled 2015-11-13: qty 3

## 2015-11-13 MED ORDER — DIGOXIN 125 MCG PO TABS
0.1250 mg | ORAL_TABLET | Freq: Every day | ORAL | Status: DC
  Administered 2015-11-14 – 2015-11-18 (×5): 0.125 mg via ORAL
  Filled 2015-11-13 (×5): qty 1

## 2015-11-13 MED ORDER — ASPIRIN EC 81 MG PO TBEC
81.0000 mg | DELAYED_RELEASE_TABLET | Freq: Every day | ORAL | Status: DC
  Administered 2015-11-14 – 2015-11-18 (×5): 81 mg via ORAL
  Filled 2015-11-13 (×5): qty 1

## 2015-11-13 MED ORDER — INSULIN ASPART 100 UNIT/ML ~~LOC~~ SOLN: 0.0000 [IU] | Freq: Every day | SUBCUTANEOUS | Status: DC

## 2015-11-13 MED ORDER — LORAZEPAM 0.5 MG PO TABS
0.2500 mg | ORAL_TABLET | Freq: Three times a day (TID) | ORAL | Status: DC | PRN
  Administered 2015-11-13 – 2015-11-18 (×6): 0.25 mg via ORAL
  Filled 2015-11-13 (×6): qty 1

## 2015-11-13 MED ORDER — SODIUM CHLORIDE 0.9 % IV BOLUS (SEPSIS)
1000.0000 mL | Freq: Once | INTRAVENOUS | Status: AC
  Administered 2015-11-13: 1000 mL via INTRAVENOUS

## 2015-11-13 MED ORDER — IPRATROPIUM BROMIDE 0.02 % IN SOLN: 0.5000 mg | Freq: Once | RESPIRATORY_TRACT | Status: DC

## 2015-11-13 MED ORDER — CETYLPYRIDINIUM CHLORIDE 0.05 % MT LIQD
7.0000 mL | Freq: Two times a day (BID) | OROMUCOSAL | Status: DC
  Administered 2015-11-13 – 2015-11-18 (×10): 7 mL via OROMUCOSAL

## 2015-11-13 MED ORDER — ALBUTEROL SULFATE (2.5 MG/3ML) 0.083% IN NEBU
2.5000 mg | INHALATION_SOLUTION | RESPIRATORY_TRACT | Status: DC | PRN
Start: 2015-11-13 — End: 2015-11-18

## 2015-11-13 MED ORDER — GLUCERNA 1.2 CAL PO LIQD
237.0000 mL | Freq: Two times a day (BID) | ORAL | Status: DC
  Administered 2015-11-14 – 2015-11-17 (×6): 237 mL via ORAL
  Filled 2015-11-13 (×15): qty 237

## 2015-11-13 MED ORDER — HYDROCODONE-ACETAMINOPHEN 5-325 MG PO TABS
1.0000 | ORAL_TABLET | Freq: Four times a day (QID) | ORAL | Status: DC | PRN
  Administered 2015-11-13 – 2015-11-17 (×6): 1 via ORAL
  Filled 2015-11-13 (×6): qty 1

## 2015-11-13 MED ORDER — ALBUTEROL SULFATE (2.5 MG/3ML) 0.083% IN NEBU: 2.5000 mg | INHALATION_SOLUTION | RESPIRATORY_TRACT | Status: DC | PRN

## 2015-11-13 MED ORDER — PIPERACILLIN-TAZOBACTAM 3.375 G IVPB
3.3750 g | Freq: Three times a day (TID) | INTRAVENOUS | Status: DC
  Administered 2015-11-13 – 2015-11-18 (×13): 3.375 g via INTRAVENOUS
  Filled 2015-11-13 (×11): qty 50

## 2015-11-13 MED ORDER — SENNOSIDES 8.6 MG PO TABS: 1.0000 | ORAL_TABLET | Freq: Every evening | ORAL | Status: DC | PRN

## 2015-11-13 MED ORDER — ALBUTEROL SULFATE (2.5 MG/3ML) 0.083% IN NEBU: 5.0000 mg | INHALATION_SOLUTION | Freq: Once | RESPIRATORY_TRACT | Status: DC

## 2015-11-13 MED ORDER — ALBUTEROL SULFATE (2.5 MG/3ML) 0.083% IN NEBU
2.5000 mg | INHALATION_SOLUTION | Freq: Once | RESPIRATORY_TRACT | Status: AC
Start: 2015-11-13 — End: 2015-11-13
  Administered 2015-11-13: 2.5 mg via RESPIRATORY_TRACT
  Filled 2015-11-13: qty 3

## 2015-11-13 MED ORDER — DEXTROSE 5 % IV SOLN
2.0000 g | Freq: Once | INTRAVENOUS | Status: AC
  Administered 2015-11-13: 2 g via INTRAVENOUS
  Filled 2015-11-13: qty 2

## 2015-11-13 MED ORDER — PRAVASTATIN SODIUM 40 MG PO TABS
80.0000 mg | ORAL_TABLET | Freq: Every evening | ORAL | Status: DC
  Administered 2015-11-13 – 2015-11-17 (×5): 80 mg via ORAL
  Filled 2015-11-13 (×5): qty 2

## 2015-11-13 MED ORDER — IPRATROPIUM-ALBUTEROL 0.5-2.5 (3) MG/3ML IN SOLN
3.0000 mL | Freq: Three times a day (TID) | RESPIRATORY_TRACT | Status: DC
  Administered 2015-11-14 – 2015-11-18 (×13): 3 mL via RESPIRATORY_TRACT
  Filled 2015-11-13 (×14): qty 3

## 2015-11-13 MED ORDER — SODIUM CHLORIDE 0.9 % IV BOLUS (SEPSIS)
1000.0000 mL | INTRAVENOUS | Status: AC
  Administered 2015-11-13: 1000 mL via INTRAVENOUS

## 2015-11-13 MED ORDER — IPRATROPIUM-ALBUTEROL 0.5-2.5 (3) MG/3ML IN SOLN
3.0000 mL | Freq: Four times a day (QID) | RESPIRATORY_TRACT | Status: DC
  Administered 2015-11-13: 3 mL via RESPIRATORY_TRACT
  Filled 2015-11-13: qty 3

## 2015-11-13 MED ORDER — CLOPIDOGREL BISULFATE 75 MG PO TABS
75.0000 mg | ORAL_TABLET | Freq: Every day | ORAL | Status: DC
  Administered 2015-11-14 – 2015-11-18 (×5): 75 mg via ORAL
  Filled 2015-11-13 (×5): qty 1

## 2015-11-13 MED ORDER — MONTELUKAST SODIUM 10 MG PO TABS
10.0000 mg | ORAL_TABLET | Freq: Every day | ORAL | Status: DC
  Administered 2015-11-13 – 2015-11-17 (×5): 10 mg via ORAL
  Filled 2015-11-13 (×5): qty 1

## 2015-11-13 MED ORDER — ENOXAPARIN SODIUM 40 MG/0.4ML ~~LOC~~ SOLN
40.0000 mg | SUBCUTANEOUS | Status: DC
  Administered 2015-11-13 – 2015-11-17 (×5): 40 mg via SUBCUTANEOUS
  Filled 2015-11-13 (×5): qty 0.4

## 2015-11-13 NOTE — Progress Notes (Signed)
MD paged and made aware of critical Lactic acid of 2.9.  Sepsis protocol already in place.

## 2015-11-13 NOTE — ED Notes (Addendum)
Per EMS, pt from [facility] c/o hypoxia and SOB. States pt's sats were in 1970s. She was placed on 6L nasal cannula and sats increased to 94%. Pt reports generalized body aches.

## 2015-11-13 NOTE — ED Notes (Signed)
Lactic Acid 2.6. EDP notified.

## 2015-11-13 NOTE — Progress Notes (Signed)
Pharmacy Antibiotic Note  Tara Park is a 70 y.o. female admitted on 11/13/2015 with pneumonia.  Ph[facility] dosing.  Plan: Vancomycin 750 IV every 12 hours.  Goal trough 15-20 mcg/mL.  Check trough at steady state Cefepime 1gm IV q8h Monitor labs, renal fxn, progress and c/s  Weight: 131 lb (59.421 kg)  Temp (24hrs), Avg:100.4 F (38 C), Min:100.4 F (38 C), Max:100.4 F (38 C)   Recent Labs Lab 11/13/15 0935  WBC 13.5*  CREATININE 0.35*  LATICACIDVEN 2.6*    Estimated Creatinine Clearance: 60.4 mL/min (by C-G formula based on Cr of 0.35).    No Known Allergies  Anti-infectives    Start     Dose/Rate Route Frequency Ordered Stop   11/13/15 1015  ceFEPIme (MAXIPIME) 2 g in dextrose 5 % 50 mL IVPB     2 g 100 mL/hr over 30 Minutes Intravenous  Once 11/13/15 1006 11/13/15 1101   11/13/15 1015  vancomycin (VANCOCIN) IVPB 1000 mg/200 mL premix     1,000 mg 200 mL/hr over 60 Minutes Intravenous  Once 11/13/15 1006 11/13/15 1129     Dose adjustments this admission: n/a  Thank you for allowing pharmacy to be a part of this patient's care.  Valrie HartHall, Ann Bohne A 11/13/2015 12:19 PM

## 2015-11-13 NOTE — H&P (Signed)
[facility] History and Physical  Tara GuyJane F Currier BJY:[id number] DOB: May 17, 1946 DOA: 11/13/2015  Referring physician: Dr. Denton LankSteinl PCP: [name],RICHARD M, MD   Chief Complaint: shortness of breath  HPI: Tara GuyJane F Park is a 70 y.o. female with history of COPD and CHF, steroid and oxygen dependent, was brought to the hospital today from nursing home after she had sudden onset of shortness of breath. Patient said that she was feeling feverish. She had development of shortness of breath and wheezing as well as coughing that began this morning. Denies any chest pain. She's not had any nausea, vomiting, diarrhea. She is brought to the emergency room for evaluation where chest x-ray indicated possible pneumonia. The patient does have a history of dysphagia and is on a modified diet. She reports compliance with her diet. She's been referred for admission for treatment of pneumonia.   Review of Systems:  Pertinent positives as per HPI, otherwise negative  Past Medical History  Diagnosis Date  . Diabetes mellitus without complication (HCC)   . Hypertension   . Tobacco abuse     ? quit 1975  . Use of cane as ambulatory aid   . Leg edema, right     chronic  . Stroke (HCC) 1975  . CHF (congestive heart failure) (HCC)   . COPD (chronic obstructive pulmonary disease) (HCC)     on O2  . Anxiety   . Respiratory failure (HCC)   . Hyperlipidemia   . Dysphagia    Past Surgical History  Procedure Laterality Date  . Cataract extraction    . Hip surgery    . Foot surgery     Social History:  reports that she has quit smoking. Her smoking use included Cigarettes. She has never used smokeless tobacco. She reports that she does not drink alcohol or use illicit drugs.  No Known Allergies  Family History  Problem Relation Age of Onset  . Transient ischemic attack Mother   . COPD Father   . Heart disease Brother     Prior to Admission medications   Medication Sig Start Date End Date  Taking? Authorizing Provider  albuterol (PROVENTIL) (2.5 MG/3ML) 0.083% nebulizer solution Take 2.5 mg by nebulization 3 (three) times daily. *may be given every 2 hours as needed for breathing/shortness of breath   Yes Historical Provider, MD  Amino Acids-Protein Hydrolys (FEEDING SUPPLEMENT, PRO-STAT 64,) LIQD Take 30 mLs by mouth 2 (two) times daily.   Yes Historical Provider, MD  aspirin EC 81 MG tablet Take 81 mg by mouth daily.   Yes Historical Provider, MD  clopidogrel (PLAVIX) 75 MG tablet Take 1 tablet (75 mg total) by mouth daily with breakfast. 06/10/15  Yes Oval Linseyichard Dondiego, MD  digoxin (LANOXIN) 0.125 MG tablet Take 1 tablet (0.125 mg total) by mouth daily. 09/05/15  Yes Oval Linseyichard Dondiego, MD  diltiazem (CARDIZEM) 30 MG tablet Take 1 tablet (30 mg total) by mouth 4 (four) times daily. 09/05/15  Yes Oval Linseyichard Dondiego, MD  fluticasone (FLOVENT HFA) 44 MCG/ACT inhaler Inhale 2 puffs into the lungs 2 (two) times daily.   Yes Historical Provider, MD  furosemide (LASIX) 20 MG tablet Take 20 mg by mouth daily.    Yes Historical Provider, MD  guaiFENesin (MUCINEX) 600 MG 12 hr tablet Take 1 tablet (600 mg total) by mouth 2 (two) times daily. 06/14/15  Yes Rolly SalterPranav M Patel, MD  HYDROcodone-acetaminophen (NORCO) 5-325 MG tablet Take one tablet by mouth every 6 hours as needed for pain. Max APAP 3gm/24hrs  from all sources 10/19/15  Yes Kimber Relic, MD  ipratropium-albuterol (DUONEB) 0.5-2.5 (3) MG/3ML SOLN Take 3 mLs by nebulization 2 (two) times daily. 09/05/15  Yes Oval Linsey, MD  LORazepam (ATIVAN) 0.5 MG tablet Take 1/2 (0.25mg  )tablet by mouth every 8 hours as needed for anxiety. 11/10/15  Yes Tiffany L Reed, DO  Melatonin 3 MG TABS Take 3 mg by mouth at bedtime.   Yes Historical Provider, MD  metFORMIN (GLUCOPHAGE) 500 MG tablet Take 500 mg by mouth 2 (two) times daily after a meal. 500 mg BID after meals   Yes Historical Provider, MD  montelukast (SINGULAIR) 10 MG tablet Take 1 tablet (10 mg  total) by mouth at bedtime. 11/11/15  Yes Pecola Lawless, MD  potassium chloride (K-DUR,KLOR-CON) 10 MEQ tablet Take 10 mEq by mouth daily.    Yes Historical Provider, MD  pravastatin (PRAVACHOL) 40 MG tablet Take 2 tablets (80 mg total) by mouth every evening. 06/14/15  Yes Rolly Salter, MD  predniSONE (DELTASONE) 20 MG tablet Take 20-30 mg by mouth daily with breakfast. Starting 11/12/2015, take 30 mg x 5 days, then take 20 mg daily thereafter. 11/11/15  Yes Pecola Lawless, MD  saccharomyces boulardii (FLORASTOR) 250 MG capsule Take 250 mg by mouth 2 (two) times daily.   Yes Historical Provider, MD  senna (SENNA LAXATIVE) 8.6 MG tablet Take 1 tablet as needed for mild constipation at bedtime   Yes Historical Provider, MD  traZODone (DESYREL) 50 MG tablet Take 1 tablet (50 mg total) by mouth at bedtime as needed for sleep. 06/14/15  Yes Rolly Salter, MD  trypsin-balsam-castor oil [place]) ointment Apply 1 application topically daily. **Apply to coccyx, bilateral buttocls, and saccrum at every shift and as needed**   Yes Historical Provider, MD  OXYGEN Inhale 2 L into the lungs daily.    Historical Provider, MD   Physical Exam: Filed Vitals:   11/13/15 1235 11/13/15 1238 11/13/15 1300 11/13/15 1331  BP:  105/63 115/63 107/65  Pulse:  107 104 102  Temp: 98.8 F (37.1 C) 98.8 F (37.1 C)    TempSrc: Rectal Rectal    Resp:  Height:     (1.676 m)  Weight:    62.5 kg (137 lb 12.6 oz)  SpO2:  92% 92% 94%    Wt Readings from Last 3 Encounters:  11/13/15 62.5 kg (137 lb 12.6 oz)  11/11/15 69.4 kg (153 lb)  11/03/15 69.4 kg (153 lb)    General:  Appears calm and comfortable Eyes: PERRL, normal lids, irises & conjunctiva ENT: grossly normal hearing, lips & tongue Neck: no LAD, masses or thyromegaly Cardiovascular: RRR, no m/r/g. No LE edema. Telemetry: SR, no arrhythmias  Respiratory: CTA bilaterally, no w/r/r. Normal respiratory effort. Abdomen: soft, ntnd Skin: no  rash or induration seen on limited exam Musculoskeletal: grossly normal tone BUE/BLE Psychiatric: grossly normal mood and affect, speech fluent and appropriate Neurologic: dense left hemiplegia          Labs on Admission:  Basic Metabolic Panel:  Recent Labs Lab 11/13/15 0935  NA 139  K 3.7  CL 97*  CO2 33*  GLUCOSE 120*  BUN 18  CREATININE 0.35*  CALCIUM 7.8*   Liver Function Tests:  Recent Labs Lab 11/13/15 0935  AST 75*  ALT 75*  ALKPHOS 506*  BILITOT 0.9  PROT 5.3*  ALBUMIN 2.1*   No results for input(s): LIPASE, AMYLASE in the last 168 hours. No  results for input(s): AMMONIA in the last 168 hours. CBC:  Recent Labs Lab 11/13/15 0935  WBC 13.5*  HGB 11.4*  HCT 35.3*  MCV 75.6*  PLT 165   Cardiac Enzymes:  Recent Labs Lab 11/13/15 0935  TROPONINI 0.05*    BNP (last 3 results)  Recent Labs  08/28/15 1740 10/19/15 1829 11/13/15 0935  BNP 131.0* 102.0* 129.0*    ProBNP (last 3 results) No results for input(s): PROBNP in the last 8760 hours.  CBG: No results for input(s): GLUCAP in the last 168 hours.  Radiological Exams on Admission: Dg Chest Portable 1 View  11/13/2015  CLINICAL DATA:  SOB. Not able to obtain past medical info from patient EXAM: PORTABLE CHEST 1 VIEW COMPARISON:  10/19/2015 FINDINGS: The patient is rotated towards the right. Heart size is probably normal. There has been interval development of patchy infiltrates in the left line, particularly dense in the left upper lobe and left lung base. There is persistent patchy density at the right lung base a possible developing infiltrate in the right upper lobe. IMPRESSION: Interval development of infiltrate, likely infectious. Electronically Signed   By: Norva Pavlov M.D.   On: 11/13/2015 09:46    EKG: Independently reviewed. No acute changes  Assessment/Plan Active Problems:   Hypertension   Demand ischemia (HCC)   COPD (chronic obstructive pulmonary disease) (HCC)    HCAP (healthcare-associated pneumonia)   Sepsis (HCC)   Acute on chronic respiratory failure (HCC)   Diabetes type 2, uncontrolled (HCC)   1. Sepsis, related to pneumonia. He has been started on sepsis protocol with IV fluids, antibiotics. The cultures have been sent will be followed up. Lactic acid was elevated on admission, but is trending down. We'll continue with current treatments. 2. Pneumonia. Healthcare associated pneumonia versus aspiration pneumonia. Treat the patient with vancomycin as well as Zosyn that should have adequate anaerobic coverage. Continue pulmonary hygiene. Will continue on modified diet request speech therapy evaluation to see if diet needs to be adjusted. 3. Acute on chronic respiratory failure. Related to pneumonia. Continue to wean down oxygen as tolerated. 4. Diabetes. Hold metformin. Start sliding scale insulin. 5. COPD . appears to be near baseline. No evidence of wheezing. Continue prednisone and pulmonary hygiene. 6. Demand ischemia. Mildly elevated troponin on admission likely related to respiratory issues. We'll continue to cycle troponin. 7. Hypertension. Continue antihypertensives. Blood pressure is currently stable.    Code Status: partial code, no intubation DVT Prophylaxis: lovenox Family Communication: discussed with children at the bedside Disposition Plan: discharge back to SNF when improved  Time spent:  [facility] Pager [phone]

## 2015-11-13 NOTE — ED Notes (Signed)
Dr Beverlee NimsSteinal notified of completion of fluids. No new orders for fluids at this time

## 2015-11-13 NOTE — ED Provider Notes (Addendum)
CSN: [id number]     Arrival date & time 11/13/15  0915 History  By signing my name below, I, Linus Galas, attest that this documentation has been prepared under the direction and in the presence of Cathren Laine, MD. Electronically Signed: Linus Galas, ED Scribe. 11/13/2015. 9:42 AM.   Chief Complaint  Patient presents with  . Shortness of Breath   Level 5 Caveat based on mental status changes.   The history is provided by the patient, the nursing home and the EMS personnel. The history is limited by the condition of the patient. No language interpreter was used.   HPI Comments: Tara Park is a 70 y.o. female brought by EMS from the [facility] DM, HTN, CHF, and COPD complaining of SOB, this morning. Moderate-severe, persistent, worsening, also noted to have increased hypoxia at ecf, sats in 70's.  Sob worse w coughing. Pt also reports cough. Nursing home called EMS after noting a low oxygen saturation. As per EMS, the pt was hypoxic with a O2 saturation in the 70s on arrival. She was placed on 6L of oxygen which improved her O2 saturation to 94%. No associated chest pain. No leg pain or swelling. No pnd or orthopnea. Level 5 caveat, severe sob, poor responsiveness.       Past Medical History  Diagnosis Date  . Diabetes mellitus without complication (HCC)   . Hypertension   . Tobacco abuse     ? quit 1975  . Use of cane as ambulatory aid   . Leg edema, right     chronic  . Stroke (HCC) 1975  . CHF (congestive heart failure) (HCC)   . COPD (chronic obstructive pulmonary disease) (HCC)     on O2  . Anxiety   . Respiratory failure (HCC)   . Hyperlipidemia   . Dysphagia    Past Surgical History  Procedure Laterality Date  . Cataract extraction    . Hip surgery    . Foot surgery     Family History  Problem Relation Age of Onset  . Transient ischemic attack Mother   . COPD Father   . Heart disease Brother    Social History  Substance Use Topics  . Smoking status:  Former Smoker    Types: Cigarettes  . Smokeless tobacco: Never Used     Comment: She stated she quit in 1975 at the time of her stroke; the history is somewhat questionable  . Alcohol Use: No   OB History    No data available        Review of Systems  Unable to perform ROS: Mental status change  Respiratory: Positive for cough and shortness of breath.   level 5 caveat - altered mental status, poor verbal responsiveness.      Allergies  Review of patient's allergies indicates no known allergies.  Home Medications   Prior to Admission medications   Medication Sig Start Date End Date Taking? Authorizing Provider  albuterol (PROVENTIL) (2.5 MG/3ML) 0.083% nebulizer solution Take 2.5 mg by nebulization 3 (three) times daily. *may be given every 2 hours as needed for breathing/shortness of breath    Historical Provider, MD  Amino Acids-Protein Hydrolys (FEEDING SUPPLEMENT, PRO-STAT 64,) LIQD Take 30 mLs by mouth 2 (two) times daily.    Historical Provider, MD  aspirin EC 81 MG tablet Take 81 mg by mouth daily.    Historical Provider, MD  clopidogrel (PLAVIX) 75 MG tablet Take 1 tablet (75 mg total) by mouth daily with  breakfast. 06/10/15   Oval Linsey, MD  digoxin (LANOXIN) 0.125 MG tablet Take 1 tablet (0.125 mg total) by mouth daily. 09/05/15   Oval Linsey, MD  diltiazem (CARDIZEM) 30 MG tablet Take 1 tablet (30 mg total) by mouth 4 (four) times daily. 09/05/15   Oval Linsey, MD  fluticasone (FLOVENT HFA) 44 MCG/ACT inhaler Inhale 2 puffs into the lungs 2 (two) times daily.    Historical Provider, MD  furosemide (LASIX) 20 MG tablet Take 20 mg by mouth daily.     Historical Provider, MD  guaiFENesin (MUCINEX) 600 MG 12 hr tablet Take 1 tablet (600 mg total) by mouth 2 (two) times daily. 06/14/15   Rolly Salter, MD  HYDROcodone-acetaminophen (NORCO) 5-325 MG tablet Take one tablet by mouth every 6 hours as needed for pain. Max APAP 3gm/24hrs from all sources 10/19/15    Kimber Relic, MD  ipratropium-albuterol (DUONEB) 0.5-2.5 (3) MG/3ML SOLN Take 3 mLs by nebulization 2 (two) times daily. 09/05/15   Oval Linsey, MD  LORazepam (ATIVAN) 0.5 MG tablet Take 1/2 (0.25mg  )tablet by mouth every 8 hours as needed for anxiety. 11/10/15   Tiffany L Reed, DO  Melatonin 3 MG TABS Take 3 mg by mouth at bedtime.    Historical Provider, MD  metFORMIN (GLUCOPHAGE) 500 MG tablet Take 500 mg by mouth 2 (two) times daily after a meal. 500 mg BID after meals    Historical Provider, MD  montelukast (SINGULAIR) 10 MG tablet Take 1 tablet (10 mg total) by mouth at bedtime. 11/11/15   Pecola Lawless, MD  nystatin (MYCOSTATIN) 100000 UNIT/ML suspension Take 5 mLs by mouth 4 (four) times daily.    Historical Provider, MD  OXYGEN Inhale 2 L into the lungs daily.    Historical Provider, MD  potassium chloride (K-DUR,KLOR-CON) 10 MEQ tablet Take 10 mEq by mouth daily.     Historical Provider, MD  pravastatin (PRAVACHOL) 40 MG tablet Take 2 tablets (80 mg total) by mouth every evening. 06/14/15   Rolly Salter, MD  predniSONE (DELTASONE) 20 MG tablet Take 1 tablet (20 mg total) by mouth daily with breakfast. 1.5 pills daily 5 days then 1 daily 11/11/15   Pecola Lawless, MD  saccharomyces boulardii (FLORASTOR) 250 MG capsule Take 250 mg by mouth 2 (two) times daily.    Historical Provider, MD  senna (SENNA LAXATIVE) 8.6 MG tablet Take 1 tablet as needed for mild constipation at bedtime    Historical Provider, MD  senna-docusate (SENOKOT-S) 8.6-50 MG tablet Take 1 tablet by mouth at bedtime as needed for mild constipation. 06/14/15   Rolly Salter, MD  traZODone (DESYREL) 50 MG tablet Take 1 tablet (50 mg total) by mouth at bedtime as needed for sleep. 06/14/15   Rolly Salter, MD  trypsin-balsam-castor oil [place]) ointment Apply 1 application topically daily. **Apply to coccyx, bilateral buttocls, and saccrum at every shift and as needed**    Historical Provider, MD   BP 110/67  mmHg  Pulse 115  Temp(Src) 100.4 F (38 C) (Rectal)  Resp 22  SpO2 86%   Physical Exam  Constitutional: She appears distressed.  Frail appearing, dyspneic.   HENT:  Head: Normocephalic and atraumatic.  Eyes: Conjunctivae are normal. No scleral icterus.  Neck: Neck supple.  No stiffness or rigidity  Cardiovascular: Normal rate, regular rhythm and normal heart sounds.   No murmur heard. Pulmonary/Chest: She is in respiratory distress. She has rhonchi.  resp distress. Rhonchi.   Abdominal: Soft.  She exhibits no distension. There is no tenderness.  Genitourinary:  No cva tenderness  Musculoskeletal: She exhibits no edema or tenderness.  Neurological: She is alert.  Skin: Skin is warm and dry. No rash noted.  Psychiatric:  Slow to respond  Nursing note and vitals reviewed.  ED Course  Procedures   DIAGNOSTIC STUDIES: Oxygen Saturation is 86% on [place], low by my interpretation.    COORDINATION OF CARE: 9:37 AM Will order CXR, blood work and EKG.  Discussed treatment plan with pt at bedside and pt agreed to plan.  Labs Review  Results for orders placed or performed during the hospital encounter of 11/13/15  Blood culture (routine x 2)  Result Value Ref Range   Specimen Description BLOOD RIGHT ANTECUBITAL    Special Requests BOTTLES DRAWN AEROBIC AND ANAEROBIC 6CC    Culture PENDING    Report Status PENDING   Blood culture (routine x 2)  Result Value Ref Range   Specimen Description BLOOD BLOOD RIGHT HAND    Special Requests BOTTLES DRAWN AEROBIC ONLY 6CC    Culture PENDING    Report Status PENDING   CBC  Result Value Ref Range   WBC 13.5 (H) 4.0 - 10.5 K/uL   RBC 4.67 3.87 - 5.11 MIL/uL   Hemoglobin 11.4 (L) 12.0 - 15.0 g/dL   HCT 09.8 (L) 11.9 - 14.7 %   MCV 75.6 (L) 78.0 - 100.0 fL   MCH 24.4 (L) 26.0 - 34.0 pg   MCHC 32.3 30.0 - 36.0 g/dL   RDW 82.9 (H) 56.2 - 13.0 %   Platelets 165 150 - 400 K/uL  Comprehensive metabolic panel  Result Value Ref Range    Sodium 139 135 - 145 mmol/L   Potassium 3.7 3.5 - 5.1 mmol/L   Chloride 97 (L) 101 - 111 mmol/L   CO2 33 (H) 22 - 32 mmol/L   Glucose, Bld 120 (H) 65 - 99 mg/dL   BUN 18 6 - 20 mg/dL   Creatinine, Ser 8.65 (L) 0.44 - 1.00 mg/dL   Calcium 7.8 (L) 8.9 - 10.3 mg/dL   Total Protein 5.3 (L) 6.5 - 8.1 g/dL   Albumin 2.1 (L) 3.5 - 5.0 g/dL   AST 75 (H) 15 - 41 U/L   ALT 75 (H) 14 - 54 U/L   Alkaline Phosphatase 506 (H) 38 - 126 U/L   Total Bilirubin 0.9 0.3 - 1.2 mg/dL   GFR calc non Af Amer >60 >60 mL/min   GFR calc Af Amer >60 >60 mL/min   Anion gap 9 5 - 15  Troponin I  Result Value Ref Range   Troponin I 0.05 (H) <0.031 ng/mL  Protime-INR  Result Value Ref Range   Prothrombin Time 15.4 (H) 11.6 - 15.2 seconds   INR 1.20 0.00 - 1.49  Lactic acid, plasma  Result Value Ref Range   Lactic Acid, Venous 2.6 (HH) 0.5 - 2.0 mmol/L  Brain natriuretic peptide  Result Value Ref Range   B Natriuretic Peptide 129.0 (H) 0.0 - 100.0 pg/mL   Dg Chest Portable 1 View  11/13/2015  CLINICAL DATA:  SOB. Not able to obtain past medical info from patient EXAM: PORTABLE CHEST 1 VIEW COMPARISON:  10/19/2015 FINDINGS: The patient is rotated towards the right. Heart size is probably normal. There has been interval development of patchy infiltrates in the left line, particularly dense in the left upper lobe and left lung base. There is persistent patchy density at the right lung base  a possible developing infiltrate in the right upper lobe. IMPRESSION: Interval development of infiltrate, likely infectious. Electronically Signed   By: Norva PavlovElizabeth  Brown M.D.   On: 11/13/2015 09:46   Dg Chest Portable 1 View  10/19/2015  CLINICAL DATA:  Cough and shortness of breath EXAM: PORTABLE CHEST 1 VIEW COMPARISON:  08/30/2015 chest radiograph. FINDINGS: Stable cardiomediastinal silhouette with normal heart size. No pneumothorax. No pleural effusion. Emphysema. Stable reticular opacities at both lung apices, in keeping with  mild pleural-parenchymal scarring. No pulmonary edema. New mild patchy opacity at the right lung base. IMPRESSION: Emphysema. New mild patchy opacity at the right lung base, favor atelectasis, cannot entirely exclude a mild/ developing pneumonia. Electronically Signed   By: Delbert PhenixJason A Poff M.D.   On: 10/19/2015 18:10        I have personally reviewed and evaluated these images and lab results as part of my medical decision-making.   EKG Interpretation   Date/Time:  Sunday November 13 2015 09:20:51 EDT Ventricular Rate:  115 PR Interval:  140 QRS Duration: 78 QT Interval:  280 QTC Calculation: 387 R Axis:   71 Text Interpretation:  Sinus tachycardia with irregular rate Nonspecific T  wave abnormality Baseline wander Confirmed by Denton LankSTEINL  MD, Caryn BeeKEVIN ([id number]) on  11/13/2015 12:25:26 PM      MDM   I personally performed the services described in this documentation, which was scribed in my presence. The recorded information has been reviewed and considered. Cathren LaineKevin Miller [place], MD  Iv ns bolus. Continuous pulse ox and monitor. o2 [place].   Albuterol and atrovent neb.  Cxr.  Blood cultures. Lactate level.  bp is soft, however patient not hypotensive and lactate 2.6 - c/w sepsis, no septic shock.  Iv antibiotics.  Code sepsis activated.   Repeat lactate pending.  Hospitalist consulted for admission.  Reviewed nursing notes and prior charts for additional history.   CRITICAL CARE  RE:  Sepsis, healthcare acquired pneumonia, copd, chr resp failure, hypoxia.  Performed by: Suzi RootsSTEINL,Perrion Diesel E Total critical care time: 40 minutes Critical care time was exclusive of separately billable procedures and treating other patients. Critical care was necessary to treat or prevent imminent or life-threatening deterioration. Critical care was time spent personally by me on the following activities: development of treatment plan with patient and/or surrogate as well as nursing, discussions with consultants,  evaluation of patient's response to treatment, examination of patient, obtaining history from patient or surrogate, ordering and performing treatments and interventions, ordering and review of laboratory studies, ordering and review of radiographic studies, pulse oximetry and re-evaluation of patient's condition.  Discussed pt with Dr Kerry HoughMemon - he indicates to admit under Dr Janna ArchonDiego to tele bed.         Cathren LaineKevin Charles Niese, MD 11/13/15 1228

## 2015-11-13 NOTE — ED Notes (Signed)
Brother and sister at bedside. Updated on condition. Spoke with [place] who is [place]

## 2015-11-14 LAB — GLUCOSE, CAPILLARY
GLUCOSE-CAPILLARY: 88 mg/dL (ref 65–99)
GLUCOSE-CAPILLARY: 84 mg/dL (ref 65–99)

## 2015-11-14 LAB — LACTIC ACID, PLASMA
Lactic Acid, Venous: 3.7 mmol/L (ref 0.5–2.0)
Lactic Acid, Venous: 2.4 mmol/L (ref 0.5–2.0)
Lactic Acid, Venous: 1.7 mmol/L (ref 0.5–2.0)

## 2015-11-14 LAB — TROPONIN I
TROPONIN I: 0.04 ng/mL — AB (ref <0.031)
Troponin I: 0.05 ng/mL — ABNORMAL HIGH (ref <0.031)

## 2015-11-14 MED ORDER — SODIUM CHLORIDE 0.9 % IV BOLUS (SEPSIS)
250.0000 mL | Freq: Once | INTRAVENOUS | Status: AC
  Administered 2015-11-14: 250 mL via INTRAVENOUS

## 2015-11-14 MED ORDER — NITROGLYCERIN 0.4 MG SL SUBL
SUBLINGUAL_TABLET | SUBLINGUAL | Status: AC
Start: 2015-11-14 — End: 2015-11-14
  Administered 2015-11-14: 0.4 mg
  Filled 2015-11-14: qty 1

## 2015-11-14 NOTE — Progress Notes (Signed)
CRITICAL VALUE ALERT  Critical value received:  Lactic Acid 3.7   Date of notification:  11/14/2015  Time of notification:  0025  Critical value read back:Yes.    Nurse who received alert:  Lenice LlamasJennifer Sidni Fusco, RN  MD notified (1st page):  Floor Coverage  Time of first page:  0025  MD notified (2nd page):  Time of second page:  Responding MD:  Floor Coverage  Time MD responded:  0030

## 2015-11-14 NOTE — Clinical Documentation Improvement (Signed)
Internal Medicine  Can the diagnosis of CHF be further specified? This was noted in H&P. Chronic types of CHF can and are coded for specificity per ICD10 guidelines. Please document findings in next progress note. Thank you!    Acuity - Acute, Chronic, Acute on Chronic   Type - Systolic, Diastolic, Systolic and Diastolic  Other  Clinically Undetermined  Document any associated diagnoses/conditions  Supporting Information:  ECHO performed on 08/29/15 reveals EF of 65 to 70% with mild LVH, abnormal LV relaxation - grade 1 diastolic CHF  Being treated with PO Lasix 20 mg daily  Please exercise your independent, professional judgment when responding. A specific answer is not anticipated or expected.  Thank You,  Shellee MiloEileen T Derryl Uher RN, BSN, CCDS Health Information Management [place] [phone]; Cell: [phone]

## 2015-11-14 NOTE — Clinical Social Work Note (Signed)
Clinical Social Work Assessment  Patient Details  Name: Tara Park F On MRN: [id number] Date of Birth: October 29, 1945  Date of referral:  11/14/15               Reason for consult:  [facility]                Permission sought to share information with:    Permission granted to share information::     Name::        Agency::     Relationship::     Contact Information:     Housing/Transportation Living arrangements for the past 2 months:  Skilled Building survey[facility]ursing Facility Source of Information:  Facility, Adult Children Patient Interpreter Needed:  None Criminal Activity/Legal Involvement Pertinent to Current Situation/Hospitalization:  No - Comment as needed Significant Relationships:  Adult Children Lives with:  Facility Resident Do you feel safe going back to the place where you live?  Yes Need for family participation in patient care:  Yes (Comment)  Care giving concerns:  Facility resident.   Social Worker assessment / plan: CSW spoke with Tara Park who advised that patient was a LT resident for nearly a year.  She stated patient had a supportive family, uses a wheelchair and was able to feed herself.  She advised that patient could return.She indicated that if patient had a PT eval that recommended PT she would need a new FL2. Patient's son, Kathlene NovemberMike, was at bedside. He confirmed Tara Park's statements and that he believed that rest of the family desired for [facility].   Employment status:  Retired Health and safety inspectornsurance information:  Medicare PT Recommendations:  Not assessed at this time Information / Referral to community resources:     Patient/Family's Response to care:  [facility]  Patient/Family's Understanding of and Emotional Response to Diagnosis, Current Treatment, and Prognosis: Family was awaiting patient's PCP to address her diagnosis, treatment and prognosis.   Emotional Assessment Appearance:  Appears stated  age Attitude/Demeanor/Rapport:   (Cooperative, engaged) Affect (typically observed):  Accepting, Calm Orientation:  Oriented to Self, Oriented to  Time Alcohol / Substance use:  Not Applicable Psych involvement (Current and /or in the community):     Discharge Needs  Concerns to be addressed:  Discharge Planning Concerns Readmission within the last 30 days:  No Current discharge risk:  None Barriers to Discharge:  No Barriers Identified   Tara Park, Tara Hudlow D, LCSW 11/14/2015, 12:57 PM

## 2015-11-14 NOTE — Care Management Note (Signed)
Case Management Note  Patient Details  Name: Tara GuyJane F Park MRN: [id number] Date of Birth: August 17, 1945  Subjective/Objective:                  Pt admitted with HCAP. [facility]. [facility] CSW is aware of DC plan and will make arrangements for return to facility.   Action/Plan: No CM needs anticipated.   Expected Discharge Date:  11/16/15               Expected Discharge Plan:  Skilled Nursing Facility  In-House Referral:  Clinical Social Work  Discharge planning Services  CM Consult  Post Acute Care Choice:  NA Choice offered to:  NA  DME Arranged:    DME Agency:     HH Arranged:    HH Agency:     Status of Service:  Completed, signed off  Medicare Important Message Given:    Date Medicare IM Given:    Medicare IM give by:    Date Additional Medicare IM Given:    Additional Medicare Important Message give by:     If discussed at Long Length of Stay Meetings, dates discussed:    Additional Comments:  Tara Park, Tara Vandehei Demske, RN 11/14/2015, 3:35 PM

## 2015-11-14 NOTE — Progress Notes (Signed)
Patient admitted placed on thank and Zosyn for multifocal infiltrates resident of SNF patient is a DO NOT INTUBATE early controlled on oral steroids for disowned 28 day DuoNeb nebulizer nasal O2 question of aspiration was entertained. Tara Park [id number] DOB: Sep 29, 1945 DOA: 11/13/2015 PCP: Tara Stalling, MD             Physical Exam: Blood pressure 118/60, pulse 149, temperature 98.1 F (36.7 C), temperature source Oral, resp. rate 18, height  (1.676 m), weight 137 lb 12.6 oz (62.5 kg), SpO2 96 %. lungs show scattered rhonchi diminished sounds in the bases prolonged inspiratory  Phase no rales appreciable no wheezes appreciable heart regular rhythm no S3-S4 no heaves thrills rubs   Investigations:  Recent Results (from the past 240 hour(s))  Blood culture (routine x 2)     Status: None (Preliminary result)   Collection Time: 11/13/15  9:35 AM  Result Value Ref Range Status   Specimen Description BLOOD RIGHT ANTECUBITAL  Final   Special Requests BOTTLES DRAWN AEROBIC AND ANAEROBIC 6CC  Final   Culture NO GROWTH 1 DAY  Final   Report Status PENDING  Incomplete  Blood culture (routine x 2)     Status: None (Preliminary result)   Collection Time: 11/13/15  9:47 AM  Result Value Ref Range Status   Specimen Description BLOOD BLOOD RIGHT HAND  Final   Special Requests BOTTLES DRAWN AEROBIC ONLY 6CC  Final   Culture NO GROWTH 1 DAY  Final   Report Status PENDING  Incomplete     Basic Metabolic Panel:  Recent Labs  57/84/69 0935  NA 139  K 3.7  CL 97*  CO2 33*  GLUCOSE 120*  BUN 18  CREATININE 0.35*  CALCIUM 7.8*   Liver Function Tests:  Recent Labs  11/13/15 0935  AST 75*  ALT 75*  ALKPHOS 506*  BILITOT 0.9  PROT 5.3*  ALBUMIN 2.1*     CBC:  Recent Labs  11/13/15 0935  WBC 13.5*  HGB 11.4*  HCT 35.3*  MCV 75.6*  PLT 165    Dg Chest Port 1 View  11/13/2015  CLINICAL DATA:  Sepsis EXAM: PORTABLE CHEST 1 VIEW COMPARISON:   11/13/2015 at 0928 hours.  10/19/2015. FINDINGS: Focal patchy left upper lobe opacity, suspicious for pneumonia. Additional progressive patchy right lower lobe opacity, suspicious for pneumonia, less likely atelectasis. Additional mild patchy opacity in the right upper lobe, possibly reflecting pneumonia. Underlying chronic interstitial markings. No definite pleural effusion. No pneumothorax. The heart is normal in size. IMPRESSION: Multifocal patchy opacities, left upper lobe and right lower lobe predominant, suspicious for multifocal pneumonia. Follow-up chest radiographs are suggested in 4-6 weeks to document clearance. Electronically Signed   By: Charline Bills M.D.   On: 11/13/2015 16:56   Dg Chest Portable 1 View  11/13/2015  CLINICAL DATA:  SOB. Not able to obtain past medical info from patient EXAM: PORTABLE CHEST 1 VIEW COMPARISON:  10/19/2015 FINDINGS: The patient is rotated towards the right. Heart size is probably normal. There has been interval development of patchy infiltrates in the left line, particularly dense in the left upper lobe and left lung base. There is persistent patchy density at the right lung base a possible developing infiltrate in the right upper lobe. IMPRESSION: Interval development of infiltrate, likely infectious. Electronically Signed   By: Norva Pavlov M.D.   On: 11/13/2015 09:46      Medications:  Impression:  Active Problems:   Hypertension   Demand  ischemia (HCC)   COPD (chronic obstructive pulmonary disease) (HCC)   HCAP (healthcare-associated pneumonia)   Sepsis (HCC)   Acute on chronic respiratory failure (HCC)   Diabetes type 2, uncontrolled (HCC)     Plan: DO NOT INTUBATE patient to new Vanco and Zosyn as ordered continue nebulizers as ordered and oral steroids obtain pulmonary consultation to consider BiPAP if necessary.  Consultants: Pulmonary consultation requested    Procedures   Antibiotics: Vancomycin and Zosyn                   Code Status: DO NOT INTUBATE  Family Communication:    Disposition Plan see plan above  Time spent: 30 minutes   LOS: 1 day   [name]   11/14/2015, 1:24 PM

## 2015-11-15 LAB — CBC WITH DIFFERENTIAL/PLATELET
BASOS ABS: 0 10*3/uL (ref 0.0–0.1)
Basophils Relative: 0 %
EOS ABS: 0 10*3/uL (ref 0.0–0.7)
EOS PCT: 0 %
HEMATOCRIT: 31 % — AB (ref 36.0–46.0)
Hemoglobin: 10.1 g/dL — ABNORMAL LOW (ref 12.0–15.0)
LYMPHS ABS: 0.5 10*3/uL — AB (ref 0.7–4.0)
Lymphocytes Relative: 3 %
MCH: 24.6 pg — ABNORMAL LOW (ref 26.0–34.0)
MCHC: 32.6 g/dL (ref 30.0–36.0)
MCV: 75.4 fL — ABNORMAL LOW (ref 78.0–100.0)
Monocytes Absolute: 0.7 10*3/uL (ref 0.1–1.0)
Monocytes Relative: 5 %
Neutro Abs: 12.9 10*3/uL — ABNORMAL HIGH (ref 1.7–7.7)
Neutrophils Relative %: 92 %
Platelets: 175 10*3/uL (ref 150–400)
RBC: 4.11 MIL/uL (ref 3.87–5.11)
RDW: 18.5 % — AB (ref 11.5–15.5)
WBC: 14.1 10*3/uL — ABNORMAL HIGH (ref 4.0–10.5)

## 2015-11-15 LAB — BASIC METABOLIC PANEL
Anion gap: 9 (ref 5–15)
BUN: 17 mg/dL (ref 6–20)
CALCIUM: 7.4 mg/dL — AB (ref 8.9–10.3)
CO2: 28 mmol/L (ref 22–32)
CREATININE: 0.38 mg/dL — AB (ref 0.44–1.00)
Chloride: 103 mmol/L (ref 101–111)
GFR calc non Af Amer: >60 mL/min (ref >60)
GLUCOSE: 86 mg/dL (ref 65–99)
Potassium: 3.1 mmol/L — ABNORMAL LOW (ref 3.5–5.1)
Sodium: 140 mmol/L (ref 135–145)

## 2015-11-15 LAB — GLUCOSE, CAPILLARY
GLUCOSE-CAPILLARY: 103 mg/dL — AB (ref 65–99)
GLUCOSE-CAPILLARY: 131 mg/dL — AB (ref 65–99)
Glucose-Capillary: 124 mg/dL — ABNORMAL HIGH (ref 65–99)

## 2015-11-15 MED ORDER — POTASSIUM CHLORIDE CRYS ER 20 MEQ PO TBCR
20.0000 meq | EXTENDED_RELEASE_TABLET | Freq: Two times a day (BID) | ORAL | Status: DC
  Administered 2015-11-15 – 2015-11-17 (×5): 20 meq via ORAL
  Filled 2015-11-15 (×5): qty 1

## 2015-11-15 NOTE — Plan of Care (Signed)
Problem: Education: Goal: Knowledge of disease or condition will improve Outcome: Not Met (add Reason) Pt too drowsy. Pt can be aroused but falls back to sleep.  Problem: Physical Regulation: Goal: Ability to maintain a body temperature in the normal range will improve Outcome: Progressing See doc flow sheet.

## 2015-11-15 NOTE — [place] FL2 (Signed)
[place] MEDICAID FL2 LEVEL OF CARE SCREENING TOOL     IDENTIFICATION  Patient Name: Tara Park Birthdate: 04-18-1946 Sex: female Admission Date (Current Location): 11/13/2015  [facility] and [place] Number:  [employer] and Address:  [facility],  [address], [place] [address]      Provider Number: [id number]  Attending Physician Name and Address:  Oval Linsey, MD  Relative Name and Phone Number:       Current Level of Care: Hospital Recommended Level of Care: Skilled Nursing Facility Prior Approval Number:    Date Approved/Denied:   PASRR Number: [id number]  Discharge Plan: SNF    Current Diagnoses: Patient Active Problem List   Diagnosis Date Noted  . Sepsis (HCC) 11/13/2015  . Acute on chronic respiratory failure (HCC) 11/13/2015  . Diabetes type 2, uncontrolled (HCC) 11/13/2015  . Anxiety state 11/09/2015  . Mental status change 11/03/2015  . Oral candidiasis 10/27/2015  . Pressure ulcer 10/20/2015  . HCAP (healthcare-associated pneumonia) 10/19/2015  . Paroxysmal a-fib (HCC) 08/28/2015  . Edema 08/27/2015  . UTI (urinary tract infection) 06/13/2015  . Demand ischemia (HCC) 06/11/2015  . Hypokalemia 06/11/2015  . Chronic systolic CHF (congestive heart failure) (HCC) 06/11/2015  . 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 COPD (chronic obstructive pulmonary disease) (HCC) 06/11/2015  . Acute ischemic stroke (HCC) 05/31/2015  . Hypertension   . 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 Blister to left heel with foam dressing. Stage II to sacrum with foam. Abrasion to left arm, leg with foam dressing. Moisture associated skin damage to sacrum. )                       Personal Care Assistance Level of Assistance  Total care       Total Care Assistance: Maximum assistance   Functional Limitations Info    Sight Info: Adequate Hearing Info: Adequate Speech Info: Adequate    SPECIAL CARE FACTORS FREQUENCY                       Contractures      Additional Factors Info  Psychotropic, Insulin Sliding Scale Code Status Info: Partial code Allergies Info: No known allergies Psychotropic Info: Ativan         Current Medications (11/15/2015):  This is the current hospital active medication list Current Facility-Administered Medications  Medication Dose Route Frequency Provider Last Rate Last Dose  . 0.9 %  sodium chloride infusion   Intravenous Continuous Erick Blinks, MD 75 mL/hr at 11/15/15 0101    . albuterol (PROVENTIL) (2.5 MG/3ML) 0.083% nebulizer solution 2.5 mg  2.5 mg Nebulization Q2H PRN Erick Blinks, MD      . antiseptic oral rinse (CPC / CETYLPYRIDINIUM CHLORIDE 0.05%) solution 7 mL  7 mL Mouth Rinse BID Erick Blinks, MD   7 mL at 11/15/15 1153  . aspirin EC tablet 81 mg  81 mg Oral Daily Erick Blinks, MD   81 mg at 11/15/15 1039  . budesonide (PULMICORT) nebulizer  solution 0.25 mg  0.25 mg Nebulization BID Erick BlinksJehanzeb Memon, MD   0.25 mg at 11/15/15 [id number]  . clopidogrel (PLAVIX) tablet 75 mg  75 mg Oral Q breakfast Erick BlinksJehanzeb Memon, MD   75 mg at 11/15/15 0839  . digoxin (LANOXIN) tablet 0.125 mg  0.125 mg Oral Daily Oval Linseyichard Dondiego, MD   0.125 mg at 11/15/15 1054  . diltiazem (CARDIZEM) tablet 30 mg  30 mg Oral QID Erick BlinksJehanzeb Memon, MD   30 mg at 11/15/15 1040  . enoxaparin (LOVENOX) injection 40 mg  40 mg Subcutaneous Q24H Erick BlinksJehanzeb Memon, MD   40 mg at 11/14/15 1748  . feeding supplement (GLUCERNA 1.2 CAL) liquid 237 mL  237 mL  Oral BID BM Oval Linseyichard Dondiego, MD   237 mL at 11/15/15 1054  . guaiFENesin (MUCINEX) 12 hr tablet 1,200 mg  1,200 mg Oral BID Erick BlinksJehanzeb Memon, MD   1,200 mg at 11/15/15 1040  . HYDROcodone-acetaminophen (NORCO/VICODIN) 5-325 MG per tablet 1 tablet  1 tablet Oral Q6H PRN Erick BlinksJehanzeb Memon, MD   1 tablet at 11/14/15 2237  . insulin aspart (novoLOG) injection 0-15 Units  0-15 Units Subcutaneous TID WC Erick BlinksJehanzeb Memon, MD   2 Units at 11/15/15 1207  . insulin aspart (novoLOG) injection 0-5 Units  0-5 Units Subcutaneous QHS Erick BlinksJehanzeb Memon, MD   0 Units at 11/13/15 2200  . ipratropium-albuterol (DUONEB) 0.5-2.5 (3) MG/3ML nebulizer solution 3 mL  3 mL Nebulization TID Oval Linseyichard Dondiego, MD   3 mL at 11/15/15 [id number]  . LORazepam (ATIVAN) tablet 0.25 mg  0.25 mg Oral Q8H PRN Erick BlinksJehanzeb Memon, MD   0.25 mg at 11/15/15 0749  . montelukast (SINGULAIR) tablet 10 mg  10 mg Oral QHS Erick BlinksJehanzeb Memon, MD   10 mg at 11/14/15 2237  . piperacillin-tazobactam (ZOSYN) IVPB 3.375 g  3.375 g Intravenous Q8H Oval Linseyichard Dondiego, MD   3.375 g at 11/15/15 1039  . pravastatin (PRAVACHOL) tablet 80 mg  80 mg Oral QPM Erick BlinksJehanzeb Memon, MD   80 mg at 11/14/15 1747  . predniSONE (DELTASONE) tablet 20 mg  20 mg Oral Q breakfast Erick BlinksJehanzeb Memon, MD   20 mg at 11/15/15 0840  . saccharomyces boulardii (FLORASTOR) capsule 250 mg  250 mg Oral BID Erick BlinksJehanzeb Memon, MD   250 mg at 11/15/15 1039  . senna (SENOKOT) tablet 8.6 mg  1 tablet Oral QHS PRN Oval Linseyichard Dondiego, MD   8.6 mg at 11/14/15 1230  . traZODone (DESYREL) tablet 50 mg  50 mg Oral QHS PRN Erick BlinksJehanzeb Memon, MD   50 mg at 11/13/15 2119  . vancomycin (VANCOCIN) IVPB 750 mg/150 ml premix  750 mg Intravenous Q12H Erick BlinksJehanzeb Memon, MD   750 mg at 11/15/15 [id number]     Discharge Medications: Please see discharge summary for a list of discharge medications.  Relevant Imaging Results:  Relevant Lab Results:   Additional Information SS#: [id number]. Pt from another SNF.   Rilley Poulter, Juleen ChinaHeather D,  LCSW

## 2015-11-15 NOTE — Evaluation (Signed)
Clinical/Bedside Swallow Evaluation Patient Details  Name: Tara GuyJane F Godsil MRN: [id number] Date of Birth: [birth year]  Today's Date: 11/15/2015 Time: SLP Start Time (ACUTE ONLY): 1400 SLP Stop Time (ACUTE ONLY): 1429 SLP Time Calculation (min) (ACUTE ONLY): 29 min  Past Medical History:  Past Medical History  Diagnosis Date  . Diabetes mellitus without complication (HCC)   . Hypertension   . Tobacco abuse     ? quit 1975  . Use of cane as ambulatory aid   . Leg edema, right     chronic  . Stroke (HCC) 1975  . CHF (congestive heart failure) (HCC)   . COPD (chronic obstructive pulmonary disease) (HCC)     on O2  . Anxiety   . Respiratory failure (HCC)   . Hyperlipidemia   . Dysphagia    Past Surgical History:  Past Surgical History  Procedure Laterality Date  . Cataract extraction    . Hip surgery    . Foot surgery     HPI:  Tara Park is a 70 y.o. female with medical history significant for CVA, DM, HTN, CHF, and COPD presented with complaints of shortness of breath this morning. Patient was hypoxic with O2 stats in the 70s on arrival and was placed on 6L [place] with improvement. She has been admitted for further evaluation and treatment. Pt has been on mechanical soft diet with honey-thick liquids since November 2016 when she had her stroke. Attempts were made at upgrades to nectar-thick liquids but were unsuccessful. Pt had a chest x-ray on 11/13/15 showed: Multifocal patchy opacities, left upper lobe and right lower lobe predominant, suspicious for multifocal pneumonia. SLP asked to evaluate swallow.   Assessment / Plan / Recommendation Clinical Impression  Pt assessed at bedside with daughter in law present. Pt has [facility] mechanical soft [facility] the [facility] several [facility]. Pt has not tolerated an upgrade from honey to nectar-thick due to severity of dysphagia, comorbidity of COPD, cognitive deficits, and decreased functional reserve. Today's  clinical swallow evaluation reveals little suspected change from previous evaluations. Pt with wet vocal quality x1 after honey-thick liquids by spoon, but spontaneously cleared with cough. SLP noted that pt had several opened containers of ice cream at bedside and reiterated to pt and family that ice cream is NOT considered honey-thick so should not be consumed. Pt is able to have [employer] however. Pt continues to have a moderate risk for aspiration despite diet modifications due to above stated reasons (severity of dysphagia post stroke, reduced cognition, and negative implications of COPD/respiratory status). Will continue D2/chopped with HTL via spoon presentation; continue oral care. [facility] SLP will follow while in acute setting.     Aspiration Risk  Moderate aspiration risk    Diet Recommendation Dysphagia 2 (Fine chop);Honey-thick liquid   Liquid Administration via: Spoon Medication Administration: Whole meds with puree Supervision: Full supervision/cueing for compensatory strategies;Patient able to self feed Compensations: Minimize environmental distractions;Slow rate;Small sips/bites;Lingual sweep for clearance of pocketing;Monitor for anterior loss;Multiple dry swallows after each bite/sip Postural Changes: Seated upright at 90 degrees;Remain upright for at least 30 minutes after po intake    Other  Recommendations Oral Care Recommendations: Oral care BID;Staff/trained caregiver to provide oral care Other Recommendations: Order thickener from pharmacy;Clarify dietary restrictions   Follow up Recommendations  24 hour supervision/assistance    Frequency and Duration min 2x/week  1 week       Prognosis Prognosis for Safe Diet  Advancement: Guarded Barriers to Reach Goals: Severity of deficits;Cognitive deficits      Swallow Study   General Date of Onset: 11/13/15 HPI: [name] is a 70 y.o. female with medical history significant for  CVA, DM, HTN, CHF, and COPD presented with complaints of shortness of breath this morning. Patient was hypoxic with O2 stats in the 70s on arrival and was placed on 6L [place] with improvement. She has been admitted for further evaluation and treatment. Pt has been on mechanical soft diet with honey-thick liquids since November 2016 when she had her stroke. Attempts were made at upgrades to nectar-thick liquids but were unsuccessful. Pt had a chest x-ray on 11/13/15 showed: Multifocal patchy opacities, left upper lobe and right lower lobe predominant, suspicious for multifocal pneumonia. SLP asked to evaluate swallow. Type of Study: Bedside Swallow Evaluation Previous Swallow Assessment: MBS 06/13/2015 D1/HTL Diet Prior to this Study: Dysphagia 2 (chopped);Honey-thick liquids Temperature Spikes Noted: No Respiratory Status: Nasal cannula History of Recent Intubation: No Behavior/Cognition: Alert;Cooperative;Confused Oral Cavity Assessment: Dry Oral Care Completed by SLP: Yes Oral Cavity - Dentition: Dentures, top;Dentures, bottom Vision: Functional for self-feeding Self-Feeding Abilities: Needs assist Patient Positioning: Upright in bed Baseline Vocal Quality: Normal Volitional Cough: Congested;Strong Volitional Swallow: Able to elicit    Oral/Motor/Sensory Function Overall Oral Motor/Sensory Function: Mild impairment Facial ROM: Reduced left Facial Symmetry: Abnormal symmetry left Lingual ROM: Within Functional Limits Lingual Symmetry: Within Functional Limits Lingual Strength: Reduced Mandible: Within Functional Limits   Ice Chips Ice chips: Within functional limits Presentation: Spoon Other Comments:  (Pt agreeable to only one ice chip)   Thin Liquid Thin Liquid: Not tested    Nectar Thick Nectar Thick Liquid: Not tested   Honey Thick Honey Thick Liquid: Impaired Presentation: Spoon Oral Phase Impairments: Reduced lingual movement/coordination Oral Phase Functional Implications:  Prolonged oral transit Pharyngeal Phase Impairments: Suspected delayed Swallow;Throat Clearing - Delayed   Puree Puree: Within functional limits Presentation: Spoon   Solid   GO   Solid: Impaired Presentation: Spoon Oral Phase Impairments: Reduced lingual movement/coordination;Impaired mastication Oral Phase Functional Implications: Prolonged oral transit;Impaired mastication       Thank you,  [name], CCC-SLP [phone]  Quade Ramirez 11/15/2015,6:09 PM

## 2015-11-15 NOTE — Clinical Social Work Note (Signed)
CSW [facility] and advised that patient owed a balance due to the family not initially paying her monthly liability.  She stated that the facility tried multiple times to set up arrangements for patient's family to pay the $2500 balance, however they advised that they could not.    CSW spoke with Ashley RoyaltyMelody Fray, daughter in law, who advised that she was initially told that patient had 100 days that would be paid for and that they facility was seek transferring the check at the end of the 100 days.  She stated that the family also attempted to allow the facility to take the $30 monthly that patient is allotted and put it towards her bill and that she thought that this was agreed upon by all parties. She advised that their family did not have $2500 for the bill.  She advised that patient could be faxed out to [facility], [facility], [facility] and [facility].    Dariel Pellecchia, Juleen ChinaHeather D, LCSW

## 2015-11-15 NOTE — Progress Notes (Signed)
Patient more alert today LESS respiratory distress on vancomycin and Zosyn Tara GuyJane F Harville WUJ:[id number] DOB: 01/02/1946 DOA: 11/13/2015 PCP: [name],[name], MD             Physical Exam: Blood pressure 115/59, pulse 74, temperature 98 F (36.7 C), temperature source Oral, resp. rate 16, height 5\' 6"  (1.676 Park), weight 137 lb 12.6 oz (62.5 kg), SpO2 92 %. Lungs show mild end expiratory wheeze decreased breath sounds at bases no rales audible heart regular rhythm no S3-S4 no heaves thrills rubs abdomen soft nontender bowel sounds normoactive   Investigations:  Recent Results (from the past 240 hour(s))  Blood culture (routine x 2)     Status: None (Preliminary result)   Collection Time: 11/13/15  9:35 AM  Result Value Ref Range Status   Specimen Description BLOOD RIGHT ANTECUBITAL  Final   Special Requests BOTTLES DRAWN AEROBIC AND ANAEROBIC 6CC  Final   Culture NO GROWTH 2 DAYS  Final   Report Status PENDING  Incomplete  Blood culture (routine x 2)     Status: None (Preliminary result)   Collection Time: 11/13/15  9:47 AM  Result Value Ref Range Status   Specimen Description BLOOD BLOOD RIGHT HAND  Final   Special Requests BOTTLES DRAWN AEROBIC ONLY 6CC  Final   Culture NO GROWTH 2 DAYS  Final   Report Status PENDING  Incomplete     Basic Metabolic Panel:  Recent Labs  95/62/1304/23/17 0935 11/15/15 0607  NA 139 140  K 3.7 3.1*  CL 97* 103  CO2 33* 28  GLUCOSE 120* 86  BUN 18 17  CREATININE 0.35* 0.38*  CALCIUM 7.8* 7.4*   Liver Function Tests:  Recent Labs  11/13/15 0935  AST 75*  ALT 75*  ALKPHOS 506*  BILITOT 0.9  PROT 5.3*  ALBUMIN 2.1*     CBC:  Recent Labs  11/13/15 0935 11/15/15 0607  WBC 13.5* 14.1*  NEUTROABS  --  12.9*  HGB 11.4* 10.1*  HCT 35.3* 31.0*  MCV 75.6* 75.4*  PLT 165 175    Dg Chest Port 1 View  11/13/2015  CLINICAL DATA:  Sepsis EXAM: PORTABLE CHEST 1 VIEW COMPARISON:  11/13/2015 at 0928 hours.  10/19/2015. FINDINGS:  Focal patchy left upper lobe opacity, suspicious for pneumonia. Additional progressive patchy right lower lobe opacity, suspicious for pneumonia, less likely atelectasis. Additional mild patchy opacity in the right upper lobe, possibly reflecting pneumonia. Underlying chronic interstitial markings. No definite pleural effusion. No pneumothorax. The heart is normal in size. IMPRESSION: Multifocal patchy opacities, left upper lobe and right lower lobe predominant, suspicious for multifocal pneumonia. Follow-up chest radiographs are suggested in 4-6 weeks to document clearance. Electronically Signed   By: Charline BillsSriyesh  Krishnan Park.D.   On: 11/13/2015 16:56      Medications:   Impression:  Active Problems:   Hypertension   Demand ischemia (HCC)   COPD (chronic obstructive pulmonary disease) (HCC)   HCAP (healthcare-associated pneumonia)   Sepsis (HCC)   Acute on chronic respiratory failure (HCC)   Diabetes type 2, uncontrolled (HCC)     Plan: K Dur 20 mEq by mouth twice a day continue vancomycin and Zosyn  Consultants: Pulmonary not available   Procedures   Antibiotics: Vancomycin and Zosyn                  Code Status:   Family Communication:    Disposition Plan   Time spent: 30 minutes   LOS: 2 days   Tara Park  Park   11/15/2015, 2:35 PM

## 2015-11-16 LAB — BASIC METABOLIC PANEL
Anion gap: 8 (ref 5–15)
BUN: 18 mg/dL (ref 6–20)
CALCIUM: 7.5 mg/dL — AB (ref 8.9–10.3)
CO2: 27 mmol/L (ref 22–32)
CREATININE: 0.45 mg/dL (ref 0.44–1.00)
Chloride: 104 mmol/L (ref 101–111)
GFR calc Af Amer: >60 mL/min (ref >60)
GLUCOSE: 105 mg/dL — AB (ref 65–99)
POTASSIUM: 2.9 mmol/L — AB (ref 3.5–5.1)
SODIUM: 139 mmol/L (ref 135–145)

## 2015-11-16 LAB — GLUCOSE, CAPILLARY
GLUCOSE-CAPILLARY: 94 mg/dL (ref 65–99)
GLUCOSE-CAPILLARY: 92 mg/dL (ref 65–99)
GLUCOSE-CAPILLARY: 105 mg/dL — AB (ref 65–99)
GLUCOSE-CAPILLARY: 150 mg/dL — AB (ref 65–99)

## 2015-11-16 NOTE — Progress Notes (Signed)
Patient ID: Clint GuyJane F Robbs, female   DOB: February 22, 1946, 70 y.o.   MRN: [id number]      This is an acute visit.  Level care skilled.  [facility]  Chief complaint-acute visit secondary to anxiety concerns follow up respiratory issues     HPI:  Patient is a 70 year old female with a history of advanced COPD on bronchodilators and inhaled steroids as well as prednisone-she had numerous hospitalizations for this recently appears to have stabilized but continues to be a very fragile individual in this regards but at this point this appears to be relatively stable.  Most acute issue has been anxiety related apparently she has times her she is yelling out apparently this is worse in the evening--she is currently on trazodone at night when necessary.    She had been seen for a psychiatric assessment 09/22/15. Major issues were insomnia and cognitive impairment. She was reported as yelling out all day long. Marland Kitchen. She was on trazodone 50 mg at bedtime;Zoloft  at that time was titrated to 50 mg daily for anxiety  She has had a recent urine culture which is negative  Medications have been reviewed per MAR.  They include albuterol nebulizers 3 times a day and every 2 hours when necessary.  Aspirin 81 mg daily.  Diltiazem 30 mg 4 times a day.  Vicodin 5-3 25 mg every 6 hours when necessary.  Flovent twice a day.  Digoxin 125 g daily.  Lasix 20 mg daily.  Glucophage 500 mg twice a day.  Mucinex 600 mg twice a day.  Potassium 10 mEq daily.  Pravastatin 40 mg daily at bedtime.  Plavix 75 mg daily.  Prednisone 20 mg twice a day.  Pro-stat.  Trazodone 50 mg daily at bedtime when necessary.  Family medical social history has been reviewed per previous progress note 10/27/2015    Comprehensive review of systems: Review of systems Somewhat limited secondary to confusion at times she not complaining of any acute shortness of breath or discomfort.  t Constitutional: No  fever,significant weight change Eyes: No redness, discharge, pain, vision change ENT/mouth: No nasal congestion,  purulent discharge, earache,change in hearing ,sore throat  Cardiovascular: No chest pain, palpitations,paroxysmal nocturnal dyspnea, claudication, edema  Gastrointestinal: No heartburn,dysphagia,abdominal pain, nausea / vomiting,rectal bleeding, melena,change in bowels Genitourinary: No dysuria,hematuria, pyuria,  incontinence, nocturia Musculoskeletal: No joint stiffness, joint swelling, pain-continues with left-sided(weakness Dermatologic: No rash, pruritus, change in appearance of skin Neurologic: No dizziness,headache,syncope, seizures, numbness , tingling Psychiatric: No significant anxiety , depression, insomnia, anorexia Hematologic/lymphatic: No significant bruising, lymphadenopathy,abnormal bleeding Allergy/immunology: No itchy/ watery eyes, significant sneezing, urticaria, angioedema  Physical exam:   Temperature 97.8 pulse 79 respirations 18 blood pressure 131/66 weight is 131.8 this appears to decrease of about 10 pounds since the beginning of the month I suspect some of this may be edema related this looks to be improved . She has complete dentures. e does not exhibit increased work of breathing but has coarse rales and rhonchi in all lung fields. Heart sounds are obscured. Edema has improved. There is wasting of the lower extremities. The left upper extremity is in a brace which for which she expressed appreciation. She is unable to raise her left upper or left lower extremity. Pedal pulses are decreased General appearance:Adequately nourished; no acute distress   Lymphatic: No lymphadenopathy about the head, neck, axilla . Eyes: No conjunctival inflammation or lid edema is present. There is no scleral icterus. Ears:  External ear exam shows no significant  lesions or deformities.   Nose:  External nasal examination shows no deformity or inflammation.  Oral exam: lips  and gums are healthy appearing.     Abdomen:Bowel sounds are normal. Abdomen is soft and nontender with no organomegaly, hernias,masses. GU: deferred as previously addressed. Extremities:  No cyanosis, clubbing, edema continues to be improved Neurologic exam : She was pleasant and appropriate day-she is a big [place] basketball fan and was able to talk about the national championship-still has confusion at times and somewhat inaccurate historian Cn 2-7 intact except for asymmetry of her smile with decrease on the left. She is wheelchair or bedbound.   Skin: Warm & dry w/o tenting. Scattered bruising mainly over the dorsum of the hands. No significant lesions or rash. She does have her wounds dressed. By report skin ulcers have been stable to improved  Labs.  11/04/2015.  WBC 6.2 hemoglobin 11.2 platelets 202.  Sodium 134 potassium 4.1 BUN 28 creatinine 0.79.  Albumin 3.1-AST 52-7 phosphatase 142 which appears to be improving  Assessment and plan.  History of advanced COPD again this is quite a challenge but appears to be relatively stable today although again a very fragile individual in this regard-she is on routine nebulizers also continues on Flovent twice a day-Mucinex 600 mg twice a day-as well as prednisone.  At this point continue to monitor.  #2 history of anxiety-apparently this is more so later in the day-apparently this is quite disruptive and concerning to patient as well as to the other residents-she will need a psych follow-up here-also will order Ativan 0.25 mg every hours when necessary.  [id number]

## 2015-11-16 NOTE — Progress Notes (Signed)
Pharmacy Antibiotic Note  Tara Park is a 70 y.o. female admitted on 11/13/2015 with pneumonia.  [facility].  Plan: Vancomycin 750 IV every 12 hours.  Goal trough 15-20 mcg/mL.  Pt lost IV access this am.  Will plan on checking VT tomorrow if continued Zosyn 3.375 gm IV q8h Monitor labs, renal fxn, progress and c/s  Height: 5\' 6"  (167.6 cm) Weight: 137 lb 12.6 oz (62.5 kg) IBW/kg (Calculated) : 59.3  Temp (24hrs), Avg:97.9 F (36.6 C), Min:97.8 F (36.6 C), Max:98 F (36.7 C)   Recent Labs Lab 11/13/15 0935 11/13/15 1220 11/13/15 1539 11/13/15 2259 11/14/15 0405 11/14/15 0705 11/15/15 0607 11/16/15 0554  WBC 13.5*  --   --   --   --   --  14.1*  --   CREATININE 0.35*  --   --   --   --   --  0.38* 0.45  LATICACIDVEN 2.6* 2.0 2.9* 3.7* 2.4* 1.7  --   --     Estimated Creatinine Clearance: 60.4 mL/min (by C-G formula based on Cr of 0.45).    No Known Allergies  Anti-infectives    Start     Dose/Rate Route Frequency Ordered Stop   11/13/15 2200  ceFEPIme (MAXIPIME) 1 g in dextrose 5 % 50 mL IVPB  Status:  Discontinued     1 g 100 mL/hr over 30 Minutes Intravenous Every 8 hours 11/13/15 1307 11/13/15 1603   11/13/15 2100  vancomycin (VANCOCIN) IVPB 750 mg/150 ml premix     750 mg 150 mL/hr over 60 Minutes Intravenous Every 12 hours 11/13/15 1307     11/13/15 1700  piperacillin-tazobactam (ZOSYN) IVPB 3.375 g     3.375 g 12.5 mL/hr over 240 Minutes Intravenous Every 8 hours 11/13/15 1640     11/13/15 1015  ceFEPIme (MAXIPIME) 2 g in dextrose 5 % 50 mL IVPB     2 g 100 mL/hr over 30 Minutes Intravenous  Once 11/13/15 1006 11/13/15 1101   11/13/15 1015  vancomycin (VANCOCIN) IVPB 1000 mg/200 mL premix     1,000 mg 200 mL/hr over 60 Minutes Intravenous  Once 11/13/15 1006 11/13/15 1129     Dose adjustments this admission: n/a  Thank you for allowing pharmacy to be a part of this patient's care.  Talbert CageSeay, Jaquae Rieves  [place] 11/16/2015 9:16 AM

## 2015-11-16 NOTE — Progress Notes (Signed)
Attempted to find vascular access with ultrasound but could not find suitable vein.

## 2015-11-16 NOTE — Progress Notes (Signed)
Patient receiving PICC line to continue intravenous antibiotic therapy lungs appear somewhat clearer she is more alert potassium still depleted however started K Dur 20 mg by mouth twice a day this morning we will check potassium tomorrow hopefully oral supplementation will suffice Tara Park QQU:[id number] DOB: 01-26-1946 DOA: 11/13/2015 PCP: Maricela Curet, MD             Physical Exam: Blood pressure 136/66, pulse 75, temperature 97.9 F (36.6 C), temperature source Oral, resp. rate 16, height '5\' 6"'  (1.676 m), weight 137 lb 12.6 oz (62.5 kg), SpO2 98 %. Lungs scattered rhonchi diminished breath sounds in the bases no rales no wheezes appreciable heart regular rhythm no S3 or S4 no heaves thrills rubs abdomen soft nontender bowel sounds normoactive   Investigations:  Recent Results (from the past 240 hour(s))  Blood culture (routine x 2)     Status: None (Preliminary result)   Collection Time: 11/13/15  9:35 AM  Result Value Ref Range Status   Specimen Description BLOOD RIGHT ANTECUBITAL DRAWN BY RN  Final   Special Requests BOTTLES DRAWN AEROBIC AND ANAEROBIC 6CC  Final   Culture NO GROWTH 3 DAYS  Final   Report Status PENDING  Incomplete  Blood culture (routine x 2)     Status: None (Preliminary result)   Collection Time: 11/13/15  9:47 AM  Result Value Ref Range Status   Specimen Description BLOOD RIGHT HAND DRAWN BY RN  Final   Special Requests BOTTLES DRAWN AEROBIC ONLY 6CC  Final   Culture NO GROWTH 3 DAYS  Final   Report Status PENDING  Incomplete     Basic Metabolic Panel:  Recent Labs  11/15/15 0607 11/16/15 0554  NA 140 139  K 3.1* 2.9*  CL 103 104  CO2 28 27  GLUCOSE 86 105*  BUN 17 18  CREATININE 0.38* 0.45  CALCIUM 7.4* 7.5*   Liver Function Tests: No results for input(s): AST, ALT, ALKPHOS, BILITOT, PROT, ALBUMIN in the last 72 hours.   CBC:  Recent Labs  11/15/15 0607  WBC 14.1*  NEUTROABS 12.9*  HGB 10.1*  HCT 31.0*  MCV 75.4*   PLT 175    No results found.    Medications:   Impression:  Active Problems:   Hypertension   Demand ischemia (HCC)   COPD (chronic obstructive pulmonary disease) ([place])   HCAP (healthcare-associated pneumonia)   Sepsis ([place])   Acute on chronic respiratory failure ([place])   Diabetes type 2, uncontrolled ([place])     Plan: K-Dur 20 includes by mouth twice a day continue vancomycin and Zosyn monitor be met in a.m.  Consultants: Pulmonary not available   Procedures   Antibiotics: Vancomycin and Zosyn IV                  Code Status:  Family Communication:    Disposition Plan   Time spent: 30 minutes   LOS: 3 days   Julianne Chamberlin M   11/16/2015, 12:50 PM

## 2015-11-17 LAB — GLUCOSE, CAPILLARY
GLUCOSE-CAPILLARY: 151 mg/dL — AB (ref 65–99)
GLUCOSE-CAPILLARY: 293 mg/dL — AB (ref 65–99)
GLUCOSE-CAPILLARY: 119 mg/dL — AB (ref 65–99)
Glucose-Capillary: 109 mg/dL — ABNORMAL HIGH (ref 65–99)

## 2015-11-17 LAB — BASIC METABOLIC PANEL
Anion gap: 7 (ref 5–15)
BUN: 17 mg/dL (ref 6–20)
CHLORIDE: 108 mmol/L (ref 101–111)
CO2: 26 mmol/L (ref 22–32)
Calcium: 7.5 mg/dL — ABNORMAL LOW (ref 8.9–10.3)
Creatinine, Ser: 0.34 mg/dL — ABNORMAL LOW (ref 0.44–1.00)
GFR calc Af Amer: >60 mL/min (ref >60)
GFR calc non Af Amer: >60 mL/min (ref >60)
GLUCOSE: 112 mg/dL — AB (ref 65–99)
POTASSIUM: 3.4 mmol/L — AB (ref 3.5–5.1)
Sodium: 141 mmol/L (ref 135–145)

## 2015-11-17 MED ORDER — POTASSIUM CHLORIDE CRYS ER 20 MEQ PO TBCR
20.0000 meq | EXTENDED_RELEASE_TABLET | Freq: Two times a day (BID) | ORAL | Status: DC
  Administered 2015-11-17 – 2015-11-18 (×3): 20 meq via ORAL
  Filled 2015-11-17 (×3): qty 1

## 2015-11-17 NOTE — Progress Notes (Signed)
Patient oriented 3 responds appropriately to questions currently receiving vancomycin and Zosyn via PICC line inserted-improved to 3.4 on K Dur 20 milk oh pounds by mouth twice a day monitor be met daily TESSY PAWELSKI [id number] DOB: September 24, 1945 DOA: 11/13/2015 PCP: Maricela Curet, MD             Physical Exam: Blood pressure 121/86, pulse 84, temperature 97.5 F (36.4 C), temperature source Oral, resp. rate 20, height '5\' 6"'  (1.676 m), weight 137 lb 12.6 oz (62.5 kg), SpO2 93 %. Lungs show diminished breath sounds in bases scattered rhonchi no rales no wheezes appreciable heart regular rhythm no S3 or S4 no heaves thrills rubs abdomen soft nontender bowel sounds normoactive   Investigations:  Recent Results (from the past 240 hour(s))  Blood culture (routine x 2)     Status: None (Preliminary result)   Collection Time: 11/13/15  9:35 AM  Result Value Ref Range Status   Specimen Description BLOOD RIGHT ANTECUBITAL DRAWN BY RN  Final   Special Requests BOTTLES DRAWN AEROBIC AND ANAEROBIC 6CC  Final   Culture NO GROWTH 4 DAYS  Final   Report Status PENDING  Incomplete  Blood culture (routine x 2)     Status: None (Preliminary result)   Collection Time: 11/13/15  9:47 AM  Result Value Ref Range Status   Specimen Description BLOOD RIGHT HAND DRAWN BY RN  Final   Special Requests BOTTLES DRAWN AEROBIC ONLY 6CC  Final   Culture NO GROWTH 4 DAYS  Final   Report Status PENDING  Incomplete     Basic Metabolic Panel:  Recent Labs  11/16/15 0554 11/17/15 0644  NA 139 141  K 2.9* 3.4*  CL 104 108  CO2 27 26  GLUCOSE 105* 112*  BUN 18 17  CREATININE 0.45 0.34*  CALCIUM 7.5* 7.5*   Liver Function Tests: No results for input(s): AST, ALT, ALKPHOS, BILITOT, PROT, ALBUMIN in the last 72 hours.   CBC:  Recent Labs  11/15/15 0607  WBC 14.1*  NEUTROABS 12.9*  HGB 10.1*  HCT 31.0*  MCV 75.4*  PLT 175    No results found.    Medications:    Impression:  Active Problems:   Hypertension   Demand ischemia (HCC)   COPD (chronic obstructive pulmonary disease) ([place])   HCAP (healthcare-associated pneumonia)   Sepsis ([place])   Acute on chronic respiratory failure (HCC)   Diabetes type 2, uncontrolled ([place])     Plan: Continue vancomycin and Zosyn monitor respiratory status continue 3 L nasal O2 monitor 8 desaturations   Consultants: Pulmonary not available   Procedures   Antibiotics: Vancomycin and Zosyn IV                  Code Status: DO NOT INTUBATE   Family Communication:    Disposition Plan see plan above  Time spent: 30 minutes   LOS: 4 days   Jeannia Tatro M   11/17/2015, 12:53 PM

## 2015-11-17 NOTE — Clinical Social Work Note (Signed)
CSW met spoke with patient's daughter-in-law, Elani Delph, who accepted bed at [facility].  CSW advised that patient may discharge tomorrow. Ms. Bellucci advised that she would notify patient's sons of the new placement change.   Campbell Kray, Clydene Pugh, LCSW

## 2015-11-18 LAB — BASIC METABOLIC PANEL
ANION GAP: 9 (ref 5–15)
Anion gap: 8 (ref 5–15)
BUN: 14 mg/dL (ref 6–20)
BUN: 14 mg/dL (ref 6–20)
CALCIUM: 7.7 mg/dL — AB (ref 8.9–10.3)
CHLORIDE: 109 mmol/L (ref 101–111)
CO2: 24 mmol/L (ref 22–32)
CO2: 23 mmol/L (ref 22–32)
Calcium: 7.8 mg/dL — ABNORMAL LOW (ref 8.9–10.3)
Chloride: 109 mmol/L (ref 101–111)
Creatinine, Ser: 0.42 mg/dL — ABNORMAL LOW (ref 0.44–1.00)
GFR calc Af Amer: >60 mL/min (ref >60)
GLUCOSE: 91 mg/dL (ref 65–99)
Glucose, Bld: 94 mg/dL (ref 65–99)
POTASSIUM: 3.7 mmol/L (ref 3.5–5.1)
Potassium: 3.7 mmol/L (ref 3.5–5.1)
SODIUM: 141 mmol/L (ref 135–145)
Sodium: 141 mmol/L (ref 135–145)

## 2015-11-18 LAB — GLUCOSE, CAPILLARY
GLUCOSE-CAPILLARY: 70 mg/dL (ref 65–99)
GLUCOSE-CAPILLARY: 122 mg/dL — AB (ref 65–99)

## 2015-11-18 LAB — CBC WITH DIFFERENTIAL/PLATELET
BASOS PCT: 0 %
Basophils Absolute: 0 10*3/uL (ref 0.0–0.1)
EOS ABS: 0 10*3/uL (ref 0.0–0.7)
Eosinophils Relative: 0 %
HEMATOCRIT: 30.3 % — AB (ref 36.0–46.0)
HEMOGLOBIN: 9.6 g/dL — AB (ref 12.0–15.0)
LYMPHS ABS: 0.7 10*3/uL (ref 0.7–4.0)
Lymphocytes Relative: 4 %
MCH: 23.8 pg — AB (ref 26.0–34.0)
MCHC: 31.7 g/dL (ref 30.0–36.0)
MCV: 75 fL — ABNORMAL LOW (ref 78.0–100.0)
MONOS PCT: 4 %
Monocytes Absolute: 0.8 10*3/uL (ref 0.1–1.0)
Neutro Abs: 16.4 10*3/uL — ABNORMAL HIGH (ref 1.7–7.7)
Neutrophils Relative %: 92 %
Platelets: 193 10*3/uL (ref 150–400)
RBC: 4.04 MIL/uL (ref 3.87–5.11)
RDW: 18.9 % — ABNORMAL HIGH (ref 11.5–15.5)
WBC: 17.9 10*3/uL — ABNORMAL HIGH (ref 4.0–10.5)

## 2015-11-18 LAB — CULTURE, BLOOD (ROUTINE X 2)
Culture: NO GROWTH
Culture: NO GROWTH

## 2015-11-18 LAB — VANCOMYCIN, TROUGH: VANCOMYCIN TR: 25 ug/mL — AB (ref 10.0–20.0)

## 2015-11-18 MED ORDER — LORAZEPAM 1 MG PO TABS: ORAL_TABLET | ORAL | Status: DC

## 2015-11-18 MED ORDER — LORAZEPAM 1 MG PO TABS: 1.0000 mg | ORAL_TABLET | Freq: Three times a day (TID) | ORAL | Status: AC

## 2015-11-18 MED ORDER — PREDNISONE 50 MG PO TABS: 20.0000 mg | ORAL_TABLET | Freq: Every day | ORAL | Status: AC

## 2015-11-18 NOTE — Progress Notes (Signed)
Pharmacy Antibiotic Note  Tara Park is a 70 y.o. female admitted on 11/13/2015 with pneumonia.  [facility]. Vanc trough this am is elevated at 25 mcg/ml  Plan: Change vanc to 1 gm IV q24 hours  Zosyn 3.375 gm IV q8h Monitor labs, renal fxn, progress and c/s  Height: 5\' 6"  (167.6 cm) Weight: 137 lb 12.6 oz (62.5 kg) IBW/kg (Calculated) : 59.3  Temp (24hrs), Avg:97.7 F (36.5 C), Min:97.5 F (36.4 C), Max:97.9 F (36.6 C)   Recent Labs Lab 11/13/15 0935 11/13/15 1220 11/13/15 1539 11/13/15 2259 11/14/15 0405 11/14/15 0705 11/15/15 [id number] 11/16/15 0554 11/17/15 0644 11/18/15 0415 11/18/15 0847  WBC 13.5*  --   --   --   --   --  14.1*  --   --  17.9*  --   CREATININE 0.35*  --   --   --   --   --  0.38* 0.45 0.34* <0.30* 0.42*  LATICACIDVEN 2.6* 2.0 2.9* 3.7* 2.4* 1.7  --   --   --   --   --   VANCOTROUGH  --   --   --   --   --   --   --   --   --   --  25*    Estimated Creatinine Clearance: 60.4 mL/min (by C-G formula based on Cr of 0.42).    No Known Allergies  Anti-infectives    Start     Dose/Rate Route Frequency Ordered Stop   11/13/15 2200  ceFEPIme (MAXIPIME) 1 g in dextrose 5 % 50 mL IVPB  Status:  Discontinued     1 g 100 mL/hr over 30 Minutes Intravenous Every 8 hours 11/13/15 1307 11/13/15 1603   11/13/15 2100  vancomycin (VANCOCIN) IVPB 750 mg/150 ml premix     750 mg 150 mL/hr over 60 Minutes Intravenous Every 12 hours 11/13/15 1307     11/13/15 1700  piperacillin-tazobactam (ZOSYN) IVPB 3.375 g     3.375 g 12.5 mL/hr over 240 Minutes Intravenous Every 8 hours 11/13/15 1640     11/13/15 1015  ceFEPIme (MAXIPIME) 2 g in dextrose 5 % 50 mL IVPB     2 g 100 mL/hr over 30 Minutes Intravenous  Once 11/13/15 1006 11/13/15 1101   11/13/15 1015  vancomycin (VANCOCIN) IVPB 1000 mg/200 mL premix     1,000 mg 200 mL/hr over 60 Minutes Intravenous  Once 11/13/15 1006 11/13/15 1129     Dose adjustments this  admission: 4/28 Dose changed to 1 gm q24   Thank you for allowing pharmacy to be a part of this patient's care.  Talbert CageSeay, Azka Steger Poteet 11/18/2015 10:41 AM

## 2015-11-18 NOTE — Discharge Summary (Signed)
Physician Discharge Summary  Tara Park:[id number] DOB: 1945/12/27 DOA: 11/13/2015  PCP: Tara Stalling, MD  Admit date: 11/13/2015 Discharge date: 11/18/2015   Recommendations for Outpatient Follow-up:  Patient is discharged to skilled nursing facility and it is ordered to take Levaquin 500 mg by mouth daily for an additional 7 days and to continue all medicines as prescribed and discharge summary she is likewise advised to have 2 L nasal O2 which is continuous to follow-up in my office in one week's time assess pulmonary improvement Discharge Diagnoses:  Active Problems:   Hypertension   Demand ischemia (HCC)   COPD (chronic obstructive pulmonary disease) (HCC)   HCAP (healthcare-associated pneumonia)   Sepsis (HCC)   Acute on chronic respiratory failure (HCC)   Diabetes type 2, uncontrolled (HCC)   Discharge Condition: Stable and improving  Filed Weights   11/13/15 1052 11/13/15 1331  Weight: 131 lb (59.421 kg) 137 lb 12.6 oz (62.5 kg)    History of present illness:  Patient is a 70 year old white female with 2 episodes of hypercapnic hypoxic respiratory insufficiency associated with the pulmonary infiltrates consideration was given to aspiration pneumonia on the second admission she was treated for healthcare associated pneumonia with vancomycin and Zosyn intravenously for a period of 7 days she continued to improve and was less hypoxia her nasal O2 was dialed down she maintained good saturations above 90% last 3 days in hospital along clinically clear she was alert and oriented and looking forward to returning to skilled nursing facility although the facility was changed due to some administrative issues she is hopefully will follow-up my office within one week's time to be reassessed for pulmonary status and to continue Levaquin Levaquin 500 milligrams by mouth daily for an additional 7 days thank you Hospital Course:  See history of present  illness  Procedures:     Consultations:    Discharge Instructions  Discharge Instructions    Discharge instructions    Complete by:  As directed      Discharge patient    Complete by:  As directed             Medication List    STOP taking these medications        saccharomyces boulardii 250 MG capsule  Commonly known as:  FLORASTOR      TAKE these medications        albuterol (2.5 MG/3ML) 0.083% nebulizer solution  Commonly known as:  PROVENTIL  Take 2.5 mg by nebulization 3 (three) times daily. *may be given every 2 hours as needed for breathing/shortness of breath     aspirin EC 81 MG tablet  Take 81 mg by mouth daily.     clopidogrel 75 MG tablet  Commonly known as:  PLAVIX  Take 1 tablet (75 mg total) by mouth daily with breakfast.     digoxin 0.125 MG tablet  Commonly known as:  LANOXIN  Take 1 tablet (0.125 mg total) by mouth daily.     diltiazem 30 MG tablet  Commonly known as:  CARDIZEM  Take 1 tablet (30 mg total) by mouth 4 (four) times daily.     feeding supplement (PRO-STAT 64) Liqd  Take 30 mLs by mouth 2 (two) times daily.     fluticasone 44 MCG/ACT inhaler  Commonly known as:  FLOVENT HFA  Inhale 2 puffs into the lungs 2 (two) times daily.     furosemide 20 MG tablet  Commonly known as:  LASIX  Take  20 mg by mouth daily.     guaiFENesin 600 MG 12 hr tablet  Commonly known as:  MUCINEX  Take 1 tablet (600 mg total) by mouth 2 (two) times daily.     HYDROcodone-acetaminophen 5-325 MG tablet  Commonly known as:  NORCO  Take one tablet by mouth every 6 hours as needed for pain. Max APAP 3gm/24hrs from all sources     ipratropium-albuterol 0.5-2.5 (3) MG/3ML Soln  Commonly known as:  DUONEB  Take 3 mLs by nebulization 2 (two) times daily.     LORazepam 1 MG tablet  Commonly known as:  ATIVAN  Take 1 tablet (1 mg total) by mouth every 8 (eight) hours.     LORazepam 1 MG tablet  Commonly known as:  ATIVAN  1 tab p.o. H.s.      Melatonin 3 MG Tabs  Take 3 mg by mouth at bedtime.     metFORMIN 500 MG tablet  Commonly known as:  GLUCOPHAGE  Take 500 mg by mouth 2 (two) times daily after a meal. 500 mg BID after meals     montelukast 10 MG tablet  Commonly known as:  SINGULAIR  Take 1 tablet (10 mg total) by mouth at bedtime.     OXYGEN  Inhale 2 L into the lungs daily.     potassium chloride 10 MEQ tablet  Commonly known as:  K-DUR,KLOR-CON  Take 10 mEq by mouth daily.     pravastatin 40 MG tablet  Commonly known as:  PRAVACHOL  Take 2 tablets (80 mg total) by mouth every evening.     predniSONE 50 MG tablet  Commonly known as:  DELTASONE  Take 0.5 tablets (25 mg total) by mouth daily with breakfast.     SENNA LAXATIVE 8.6 MG tablet  Generic drug:  senna  Take 1 tablet as needed for mild constipation at bedtime      ASK your doctor about these medications        traZODone 50 MG tablet  Commonly known as:  DESYREL  Take 1 tablet (50 mg total) by mouth at bedtime as needed for sleep.     trypsin-balsam-castor oil ointment  Commonly known as:  XENADERM  Apply 1 application topically daily. **Apply to coccyx, bilateral buttocls, and saccrum at every shift and as needed**       No Known Allergies     Follow-up Information    Follow up with [facility] .   Specialty:  Skilled Nursing Facility   Contact information:   [address] [place] [id number] [phone]       The results of significant diagnostics from this hospitalization (including imaging, microbiology, ancillary and laboratory) are listed below for reference.    Significant Diagnostic Studies: Dg Chest Port 1 View  11/13/2015  CLINICAL DATA:  Sepsis EXAM: PORTABLE CHEST 1 VIEW COMPARISON:  11/13/2015 at 0928 hours.  10/19/2015. FINDINGS: Focal patchy left upper lobe opacity, suspicious for pneumonia. Additional progressive patchy right lower lobe opacity, suspicious for pneumonia, less likely  atelectasis. Additional mild patchy opacity in the right upper lobe, possibly reflecting pneumonia. Underlying chronic interstitial markings. No definite pleural effusion. No pneumothorax. The heart is normal in size. IMPRESSION: Multifocal patchy opacities, left upper lobe and right lower lobe predominant, suspicious for multifocal pneumonia. Follow-up chest radiographs are suggested in 4-6 weeks to document clearance. Electronically Signed   By: Charline BillsSriyesh  Krishnan M.D.   On: 11/13/2015 16:56   Dg Chest Portable 1  View  11/13/2015  CLINICAL DATA:  SOB. Not able to obtain past medical info from patient EXAM: PORTABLE CHEST 1 VIEW COMPARISON:  10/19/2015 FINDINGS: The patient is rotated towards the right. Heart size is probably normal. There has been interval development of patchy infiltrates in the left line, particularly dense in the left upper lobe and left lung base. There is persistent patchy density at the right lung base a possible developing infiltrate in the right upper lobe. IMPRESSION: Interval development of infiltrate, likely infectious. Electronically Signed   By: Norva Pavlov M.D.   On: 11/13/2015 09:46   Dg Chest Portable 1 View  10/19/2015  CLINICAL DATA:  Cough and shortness of breath EXAM: PORTABLE CHEST 1 VIEW COMPARISON:  08/30/2015 chest radiograph. FINDINGS: Stable cardiomediastinal silhouette with normal heart size. No pneumothorax. No pleural effusion. Emphysema. Stable reticular opacities at both lung apices, in keeping with mild pleural-parenchymal scarring. No pulmonary edema. New mild patchy opacity at the right lung base. IMPRESSION: Emphysema. New mild patchy opacity at the right lung base, favor atelectasis, cannot entirely exclude a mild/ developing pneumonia. Electronically Signed   By: Delbert Phenix M.D.   On: 10/19/2015 18:10    Microbiology: Recent Results (from the past 240 hour(s))  Blood culture (routine x 2)     Status: None   Collection Time: 11/13/15  9:35 AM   Result Value Ref Range Status   Specimen Description BLOOD RIGHT ANTECUBITAL DRAWN BY RN  Final   Special Requests BOTTLES DRAWN AEROBIC AND ANAEROBIC 6CC  Final   Culture NO GROWTH 5 DAYS  Final   Report Status 11/18/2015 FINAL  Final  Blood culture (routine x 2)     Status: None   Collection Time: 11/13/15  9:47 AM  Result Value Ref Range Status   Specimen Description BLOOD RIGHT HAND DRAWN BY RN  Final   Special Requests BOTTLES DRAWN AEROBIC ONLY 6CC  Final   Culture NO GROWTH 5 DAYS  Final   Report Status 11/18/2015 FINAL  Final     Labs: Basic Metabolic Panel:  Recent Labs Lab 11/15/15 0607 11/16/15 0554 11/17/15 0644 11/18/15 0415 11/18/15 0847  NA 140 139 141 141 141  K 3.1* 2.9* 3.4* 3.7 3.7  CL 103 104 108 109 109  CO2 GLUCOSE 86 105* 112* 91 94  BUN CREATININE 0.38* 0.45 0.34* <0.30* 0.42*  CALCIUM 7.4* 7.5* 7.5* 7.7* 7.8*   Liver Function Tests:  Recent Labs Lab 11/13/15 0935  AST 75*  ALT 75*  ALKPHOS 506*  BILITOT 0.9  PROT 5.3*  ALBUMIN 2.1*   No results for input(s): LIPASE, AMYLASE in the last 168 hours. No results for input(s): AMMONIA in the last 168 hours. CBC:  Recent Labs Lab 11/13/15 0935 11/15/15 0607 11/18/15 0415  WBC 13.5* 14.1* 17.9*  NEUTROABS  --  12.9* 16.4*  HGB 11.4* 10.1* 9.6*  HCT 35.3* 31.0* 30.3*  MCV 75.6* 75.4* 75.0*  PLT 165 175 193   Cardiac Enzymes:  Recent Labs Lab 11/13/15 0935 11/13/15 1946 11/13/15 2259 11/14/15 0405  TROPONINI 0.05* 0.04* 0.04* 0.05*   BNP: BNP (last 3 results)  Recent Labs  08/28/15 1740 10/19/15 1829 11/13/15 0935  BNP 131.0* 102.0* 129.0*    ProBNP (last 3 results) No results for input(s): PROBNP in the last 8760 hours.  CBG:  Recent Labs Lab 11/17/15 1100 11/17/15 1636 11/17/15 2054 11/18/15 0739 11/18/15 1128  GLUCAP 151*  293* 119* 70 122*       Signed:  Marielle Mantione M  [facility] Pager:  [phone] 11/18/2015, 12:29 PM

## 2015-11-18 NOTE — Care Management Important Message (Signed)
Important Message  Patient Details  Name: Tara GuyJane F Park MRN: [id number] Date of Birth: September 14, 1945   Medicare Important Message Given:  Yes    Malcolm MetroChildress, Standley Bargo Demske, RN 11/18/2015, 9:58 AM

## 2015-11-22 ENCOUNTER — Inpatient Hospital Stay (HOSPITAL_COMMUNITY): Payer: Medicare Other

## 2015-11-22 ENCOUNTER — Emergency Department (HOSPITAL_COMMUNITY): Payer: Medicare Other

## 2015-11-22 ENCOUNTER — Encounter (HOSPITAL_COMMUNITY): Payer: Self-pay | Admitting: Emergency Medicine

## 2015-11-22 ENCOUNTER — Inpatient Hospital Stay (HOSPITAL_COMMUNITY)
Admission: EM | Admit: 2015-11-22 | Discharge: 2015-12-22 | DRG: 177 | Disposition: E | Payer: Medicare Other | Attending: Family Medicine | Admitting: Family Medicine

## 2015-11-22 DIAGNOSIS — J9621 Acute and chronic respiratory failure with hypoxia: Secondary | ICD-10-CM | POA: Diagnosis present

## 2015-11-22 DIAGNOSIS — E876 Hypokalemia: Secondary | ICD-10-CM | POA: Diagnosis present

## 2015-11-22 DIAGNOSIS — J449 Chronic obstructive pulmonary disease, unspecified: Secondary | ICD-10-CM | POA: Diagnosis present

## 2015-11-22 DIAGNOSIS — J189 Pneumonia, unspecified organism: Secondary | ICD-10-CM | POA: Diagnosis not present

## 2015-11-22 DIAGNOSIS — Z9049 Acquired absence of other specified parts of digestive tract: Secondary | ICD-10-CM | POA: Diagnosis not present

## 2015-11-22 DIAGNOSIS — Z515 Encounter for palliative care: Secondary | ICD-10-CM | POA: Insufficient documentation

## 2015-11-22 DIAGNOSIS — Z8249 Family history of ischemic heart disease and other diseases of the circulatory system: Secondary | ICD-10-CM

## 2015-11-22 DIAGNOSIS — Z825 Family history of asthma and other chronic lower respiratory diseases: Secondary | ICD-10-CM | POA: Diagnosis not present

## 2015-11-22 DIAGNOSIS — J690 Pneumonitis due to inhalation of food and vomit: Secondary | ICD-10-CM | POA: Diagnosis present

## 2015-11-22 DIAGNOSIS — R0602 Shortness of breath: Secondary | ICD-10-CM | POA: Diagnosis present

## 2015-11-22 DIAGNOSIS — Z66 Do not resuscitate: Secondary | ICD-10-CM | POA: Diagnosis not present

## 2015-11-22 DIAGNOSIS — Z7951 Long term (current) use of inhaled steroids: Secondary | ICD-10-CM

## 2015-11-22 DIAGNOSIS — Z79899 Other long term (current) drug therapy: Secondary | ICD-10-CM

## 2015-11-22 DIAGNOSIS — Z794 Long term (current) use of insulin: Secondary | ICD-10-CM

## 2015-11-22 DIAGNOSIS — I110 Hypertensive heart disease with heart failure: Secondary | ICD-10-CM | POA: Diagnosis present

## 2015-11-22 DIAGNOSIS — R748 Abnormal levels of other serum enzymes: Secondary | ICD-10-CM

## 2015-11-22 DIAGNOSIS — Z7902 Long term (current) use of antithrombotics/antiplatelets: Secondary | ICD-10-CM

## 2015-11-22 DIAGNOSIS — I5032 Chronic diastolic (congestive) heart failure: Secondary | ICD-10-CM | POA: Diagnosis present

## 2015-11-22 DIAGNOSIS — E785 Hyperlipidemia, unspecified: Secondary | ICD-10-CM | POA: Diagnosis present

## 2015-11-22 DIAGNOSIS — Z87891 Personal history of nicotine dependence: Secondary | ICD-10-CM | POA: Diagnosis not present

## 2015-11-22 DIAGNOSIS — J440 Chronic obstructive pulmonary disease with acute lower respiratory infection: Secondary | ICD-10-CM | POA: Diagnosis present

## 2015-11-22 DIAGNOSIS — Z7982 Long term (current) use of aspirin: Secondary | ICD-10-CM | POA: Diagnosis not present

## 2015-11-22 DIAGNOSIS — F419 Anxiety disorder, unspecified: Secondary | ICD-10-CM | POA: Diagnosis present

## 2015-11-22 DIAGNOSIS — Z7189 Other specified counseling: Secondary | ICD-10-CM | POA: Insufficient documentation

## 2015-11-22 DIAGNOSIS — E119 Type 2 diabetes mellitus without complications: Secondary | ICD-10-CM | POA: Diagnosis present

## 2015-11-22 DIAGNOSIS — Z9981 Dependence on supplemental oxygen: Secondary | ICD-10-CM | POA: Diagnosis not present

## 2015-11-22 DIAGNOSIS — I69354 Hemiplegia and hemiparesis following cerebral infarction affecting left non-dominant side: Secondary | ICD-10-CM | POA: Diagnosis not present

## 2015-11-22 DIAGNOSIS — Y95 Nosocomial condition: Secondary | ICD-10-CM | POA: Diagnosis present

## 2015-11-22 DIAGNOSIS — J9620 Acute and chronic respiratory failure, unspecified whether with hypoxia or hypercapnia: Secondary | ICD-10-CM | POA: Diagnosis present

## 2015-11-22 LAB — MRSA PCR SCREENING: MRSA by PCR: POSITIVE — AB

## 2015-11-22 LAB — CBC WITH DIFFERENTIAL/PLATELET
BASOS PCT: 0 %
Basophils Absolute: 0 10*3/uL (ref 0.0–0.1)
EOS ABS: 0 10*3/uL (ref 0.0–0.7)
Eosinophils Relative: 0 %
HCT: 33 % — ABNORMAL LOW (ref 36.0–46.0)
Hemoglobin: 10.6 g/dL — ABNORMAL LOW (ref 12.0–15.0)
Lymphocytes Relative: 5 %
Lymphs Abs: 1.1 10*3/uL (ref 0.7–4.0)
MCH: 24.4 pg — AB (ref 26.0–34.0)
MCHC: 32.1 g/dL (ref 30.0–36.0)
MCV: 75.9 fL — AB (ref 78.0–100.0)
MONOS PCT: 2 %
Monocytes Absolute: 0.4 10*3/uL (ref 0.1–1.0)
NEUTROS PCT: 93 %
Neutro Abs: 20.6 10*3/uL — ABNORMAL HIGH (ref 1.7–7.7)
PLATELETS: 130 10*3/uL — AB (ref 150–400)
RBC: 4.35 MIL/uL (ref 3.87–5.11)
RDW: 19.7 % — AB (ref 11.5–15.5)
WBC: 22 10*3/uL — AB (ref 4.0–10.5)

## 2015-11-22 LAB — COMPREHENSIVE METABOLIC PANEL
ALK PHOS: 1015 U/L — AB (ref 38–126)
ALT: 74 U/L — AB (ref 14–54)
AST: 97 U/L — ABNORMAL HIGH (ref 15–41)
Albumin: 1.9 g/dL — ABNORMAL LOW (ref 3.5–5.0)
Anion gap: 9 (ref 5–15)
BUN: 12 mg/dL (ref 6–20)
CALCIUM: 7.5 mg/dL — AB (ref 8.9–10.3)
CO2: 28 mmol/L (ref 22–32)
CREATININE: 0.38 mg/dL — AB (ref 0.44–1.00)
Chloride: 103 mmol/L (ref 101–111)
Glucose, Bld: 111 mg/dL — ABNORMAL HIGH (ref 65–99)
Potassium: 3.6 mmol/L (ref 3.5–5.1)
Sodium: 140 mmol/L (ref 135–145)
Total Bilirubin: 1.1 mg/dL (ref 0.3–1.2)
Total Protein: 5.1 g/dL — ABNORMAL LOW (ref 6.5–8.1)

## 2015-11-22 LAB — GLUCOSE, CAPILLARY
GLUCOSE-CAPILLARY: 101 mg/dL — AB (ref 65–99)
Glucose-Capillary: 91 mg/dL (ref 65–99)
Glucose-Capillary: 82 mg/dL (ref 65–99)
Glucose-Capillary: 97 mg/dL (ref 65–99)

## 2015-11-22 LAB — TROPONIN I: Troponin I: 0.05 ng/mL — ABNORMAL HIGH (ref <0.031)

## 2015-11-22 LAB — BRAIN NATRIURETIC PEPTIDE: B Natriuretic Peptide: 118 pg/mL — ABNORMAL HIGH (ref 0.0–100.0)

## 2015-11-22 MED ORDER — LORAZEPAM 1 MG PO TABS
1.0000 mg | ORAL_TABLET | Freq: Three times a day (TID) | ORAL | Status: DC
  Administered 2015-11-22 – 2015-11-26 (×8): 1 mg via ORAL
  Filled 2015-11-22 (×10): qty 1

## 2015-11-22 MED ORDER — ALBUTEROL SULFATE (2.5 MG/3ML) 0.083% IN NEBU
2.5000 mg | INHALATION_SOLUTION | Freq: Once | RESPIRATORY_TRACT | Status: AC
  Administered 2015-11-22: 2.5 mg via RESPIRATORY_TRACT
  Filled 2015-11-22: qty 3

## 2015-11-22 MED ORDER — IOPAMIDOL (ISOVUE-300) INJECTION 61%
75.0000 mL | Freq: Once | INTRAVENOUS | Status: AC | PRN
Start: 2015-11-22 — End: 2015-11-22
  Administered 2015-11-22: 75 mL via INTRAVENOUS

## 2015-11-22 MED ORDER — ONDANSETRON HCL 4 MG/2ML IJ SOLN: 4.0000 mg | Freq: Four times a day (QID) | INTRAMUSCULAR | Status: DC | PRN

## 2015-11-22 MED ORDER — ENOXAPARIN SODIUM 40 MG/0.4ML ~~LOC~~ SOLN
40.0000 mg | SUBCUTANEOUS | Status: DC
  Administered 2015-11-22 – 2015-11-26 (×5): 40 mg via SUBCUTANEOUS
  Filled 2015-11-22 (×5): qty 0.4

## 2015-11-22 MED ORDER — IPRATROPIUM BROMIDE 0.02 % IN SOLN: 0.5000 mg | Freq: Once | RESPIRATORY_TRACT | Status: DC

## 2015-11-22 MED ORDER — SODIUM CHLORIDE 0.9% FLUSH
3.0000 mL | Freq: Two times a day (BID) | INTRAVENOUS | Status: DC
  Administered 2015-11-22 – 2015-11-28 (×9): 3 mL via INTRAVENOUS

## 2015-11-22 MED ORDER — VANCOMYCIN HCL IN DEXTROSE 750-5 MG/150ML-% IV SOLN
INTRAVENOUS | Status: AC
  Filled 2015-11-22: qty 150

## 2015-11-22 MED ORDER — DEXTROSE 5 % IV SOLN
1.0000 g | Freq: Two times a day (BID) | INTRAVENOUS | Status: DC
  Administered 2015-11-22 – 2015-11-26 (×10): 1 g via INTRAVENOUS
  Filled 2015-11-22 (×13): qty 1

## 2015-11-22 MED ORDER — VANCOMYCIN HCL IN DEXTROSE 750-5 MG/150ML-% IV SOLN
750.0000 mg | Freq: Two times a day (BID) | INTRAVENOUS | Status: DC
  Administered 2015-11-22 – 2015-11-25 (×6): 750 mg via INTRAVENOUS
  Filled 2015-11-22 (×13): qty 150

## 2015-11-22 MED ORDER — MELATONIN 3 MG PO TABS: 3.0000 mg | ORAL_TABLET | Freq: Every day | ORAL | Status: DC

## 2015-11-22 MED ORDER — INSULIN ASPART 100 UNIT/ML ~~LOC~~ SOLN
0.0000 [IU] | SUBCUTANEOUS | Status: DC
  Administered 2015-11-24: 2 [IU] via SUBCUTANEOUS
  Administered 2015-11-27: 1 [IU] via SUBCUTANEOUS

## 2015-11-22 MED ORDER — CHLORHEXIDINE GLUCONATE CLOTH 2 % EX PADS
6.0000 | MEDICATED_PAD | Freq: Every day | CUTANEOUS | Status: AC
  Administered 2015-11-23 – 2015-11-27 (×5): 6 via TOPICAL

## 2015-11-22 MED ORDER — IPRATROPIUM-ALBUTEROL 0.5-2.5 (3) MG/3ML IN SOLN
3.0000 mL | Freq: Two times a day (BID) | RESPIRATORY_TRACT | Status: DC
  Administered 2015-11-22 – 2015-11-27 (×9): 3 mL via RESPIRATORY_TRACT
  Filled 2015-11-22 (×11): qty 3

## 2015-11-22 MED ORDER — VANCOMYCIN HCL IN DEXTROSE 750-5 MG/150ML-% IV SOLN
750.0000 mg | Freq: Once | INTRAVENOUS | Status: AC
  Administered 2015-11-22: 750 mg via INTRAVENOUS
  Filled 2015-11-22: qty 150

## 2015-11-22 MED ORDER — CLOPIDOGREL BISULFATE 75 MG PO TABS
75.0000 mg | ORAL_TABLET | Freq: Every day | ORAL | Status: DC
  Administered 2015-11-22 – 2015-11-25 (×2): 75 mg via ORAL
  Filled 2015-11-22 (×3): qty 1

## 2015-11-22 MED ORDER — SENNA 8.6 MG PO TABS
1.0000 | ORAL_TABLET | Freq: Two times a day (BID) | ORAL | Status: DC
  Administered 2015-11-22 – 2015-11-26 (×5): 8.6 mg via ORAL
  Filled 2015-11-22 (×6): qty 1

## 2015-11-22 MED ORDER — DIGOXIN 125 MCG PO TABS
0.1250 mg | ORAL_TABLET | Freq: Every day | ORAL | Status: DC
Start: 2015-11-22 — End: 2015-11-27
  Administered 2015-11-22 – 2015-11-26 (×3): 0.125 mg via ORAL
  Filled 2015-11-22 (×3): qty 1

## 2015-11-22 MED ORDER — IPRATROPIUM-ALBUTEROL 0.5-2.5 (3) MG/3ML IN SOLN
3.0000 mL | Freq: Once | RESPIRATORY_TRACT | Status: AC
  Administered 2015-11-22: 3 mL via RESPIRATORY_TRACT
  Filled 2015-11-22: qty 3

## 2015-11-22 MED ORDER — PIPERACILLIN-TAZOBACTAM 3.375 G IVPB 30 MIN
3.3750 g | Freq: Once | INTRAVENOUS | Status: AC
  Administered 2015-11-22: 3.375 g via INTRAVENOUS
  Filled 2015-11-22: qty 50

## 2015-11-22 MED ORDER — GUAIFENESIN ER 600 MG PO TB12
600.0000 mg | ORAL_TABLET | Freq: Two times a day (BID) | ORAL | Status: DC
  Administered 2015-11-22 – 2015-11-26 (×5): 600 mg via ORAL
  Filled 2015-11-22 (×6): qty 1

## 2015-11-22 MED ORDER — CETYLPYRIDINIUM CHLORIDE 0.05 % MT LIQD
7.0000 mL | Freq: Two times a day (BID) | OROMUCOSAL | Status: DC
  Administered 2015-11-22 – 2015-11-26 (×6): 7 mL via OROMUCOSAL

## 2015-11-22 MED ORDER — MUPIROCIN 2 % EX OINT
1.0000 "application " | TOPICAL_OINTMENT | Freq: Two times a day (BID) | CUTANEOUS | Status: AC
  Administered 2015-11-22 – 2015-11-26 (×9): 1 via NASAL
  Filled 2015-11-22 (×2): qty 22

## 2015-11-22 MED ORDER — ASPIRIN EC 81 MG PO TBEC
81.0000 mg | DELAYED_RELEASE_TABLET | Freq: Every day | ORAL | Status: DC
  Administered 2015-11-22 – 2015-11-26 (×3): 81 mg via ORAL
  Filled 2015-11-22 (×2): qty 1

## 2015-11-22 MED ORDER — BUDESONIDE 0.25 MG/2ML IN SUSP
0.2500 mg | Freq: Two times a day (BID) | RESPIRATORY_TRACT | Status: DC
  Administered 2015-11-22 – 2015-11-27 (×9): 0.25 mg via RESPIRATORY_TRACT
  Filled 2015-11-22 (×9): qty 2

## 2015-11-22 MED ORDER — ONDANSETRON HCL 4 MG PO TABS: 4.0000 mg | ORAL_TABLET | Freq: Four times a day (QID) | ORAL | Status: DC | PRN

## 2015-11-22 MED ORDER — DILTIAZEM HCL 30 MG PO TABS
30.0000 mg | ORAL_TABLET | Freq: Four times a day (QID) | ORAL | Status: DC
  Administered 2015-11-22 – 2015-11-26 (×10): 30 mg via ORAL
  Filled 2015-11-22 (×9): qty 1

## 2015-11-22 MED ORDER — PRAVASTATIN SODIUM 40 MG PO TABS
80.0000 mg | ORAL_TABLET | Freq: Every evening | ORAL | Status: DC
  Administered 2015-11-22 – 2015-11-26 (×3): 80 mg via ORAL
  Filled 2015-11-22 (×2): qty 2

## 2015-11-22 MED ORDER — SODIUM CHLORIDE 0.9 % IV BOLUS (SEPSIS)
1000.0000 mL | Freq: Once | INTRAVENOUS | Status: AC
  Administered 2015-11-22: 1000 mL via INTRAVENOUS

## 2015-11-22 MED ORDER — ALBUTEROL SULFATE (2.5 MG/3ML) 0.083% IN NEBU: 5.0000 mg | INHALATION_SOLUTION | Freq: Once | RESPIRATORY_TRACT | Status: DC

## 2015-11-22 NOTE — Clinical Social Work Note (Signed)
Clinical Social Work Assessment  Patient Details  Name: Tara Park MRN: [id number] Date of Birth: Feb 26, 1946  Date of referral:                  Reason for consult:  Other (Comment Required) (Admitted from [facility])                Permission sought to share information with:    Permission granted to share information::     Name::        Agency::     Relationship::     Contact Information:     Housing/Transportation Living arrangements for the past 2 months:  Skilled Nursing Facility Source of Information:  Facility, Adult Children Patient Interpreter Needed:  None Criminal Activity/Legal Involvement Pertinent to Current Situation/Hospitalization:  No - Comment as needed Significant Relationships:  Adult Children Lives with:  Facility Resident Do you feel safe going back to the place where you live?  Yes Need for family participation in patient care:  Yes (Comment)  Care giving concerns:  None identified, facility resident.   Social Worker assessment / plan:  [facility]  [facility] CSW spoke with Melissa at [facility] who was agreeable to accept patient back as a patient.  CSW spoke with patient's son, Tara NovemberMike, and daughter in law, [name], who confirmed that it was the family's plan for patient to return to [facility] at discharge.    Employment status:  Retired Health and safety inspectornsurance information:  Armed forces operational officerMedicare, [place] Recommend[place]:  Not assessed at this time Information / Referral to community resources:     Patient/Family's Response to care:  Tara ChamberFamiliy is agreeable for patient to return to [facility] at discharge.   Patient/Family's Understanding of and Emotional Response to Diagnosis, Current Treatment, and Prognosis: Family understands patient's diagnosis, treatment and prognosis.   Emotional Assessment Appearance:  Developmentally appropriate Attitude/Demeanor/Rapport:  Unable to Assess Affect (typically observed):   Unable to Assess Orientation:  Oriented to Self Alcohol / Substance use:  Not Applicable Psych involvement (Current and /or in the community):  No (Comment)  Discharge Needs  Concerns to be addressed:  Discharge Planning Concerns Readmission within the last 30 days:  Yes Current discharge risk:  None Barriers to Discharge:  No Barriers Identified   Tara NeedySettle, Tara Graef D, LCSW 11/27/2015, 1:03 PM

## 2015-11-22 NOTE — Progress Notes (Signed)
Patient with general debilitation over the previous 3 months status post treatment for pulmonary infiltrate with Vanco and Zosyn for 1 week with discharged on Levaquin to skilled nursing facility saturation given to aspiration at that time comes in now with multi lobar infiltrates placed on vancomycin and Maxipime as well as Zosyn on this admission is currently nothing by mouth with diet recommended by speech therapy as honey thickened she is grossly elevated alkaline phosphatase despite cholecystectomy years ago will perform abdominal sonogram of right upper quadrant to assess that and elevated hepatic function tests KIANNA BILLET ZOX:[id number] DOB: May 12, 1946 DOA: 12/01/15 PCP: Isabella Stalling, MD             Physical Exam: Blood pressure 96/68, pulse 95, temperature 97.6 F (36.4 C), resp. rate 20, height 5\' 6"  (1.676 m), weight 137 lb (62.143 kg), SpO2 92 %. lungs show scattered rhonchi mild end expiratory wheezes diminished breath sounds in the bases no rales audible heart regular rhythm no S3-S4 no heaves thrills or rubs. Abdomen soft nontender bowel sounds normoactive   Investigations:  Recent Results (from the past 240 hour(s))  Blood culture (routine x 2)     Status: None   Collection Time: 11/13/15  9:35 AM  Result Value Ref Range Status   Specimen Description BLOOD RIGHT ANTECUBITAL DRAWN BY RN  Final   Special Requests BOTTLES DRAWN AEROBIC AND ANAEROBIC 6CC  Final   Culture NO GROWTH 5 DAYS  Final   Report Status 11/18/2015 FINAL  Final  Blood culture (routine x 2)     Status: None   Collection Time: 11/13/15  9:47 AM  Result Value Ref Range Status   Specimen Description BLOOD RIGHT HAND DRAWN BY RN  Final   Special Requests BOTTLES DRAWN AEROBIC ONLY 6CC  Final   Culture NO GROWTH 5 DAYS  Final   Report Status 11/18/2015 FINAL  Final  Culture, blood (routine x 2)     Status: None (Preliminary result)   Collection Time: Dec 01, 2015  2:10 AM  Result Value Ref Range  Status   Specimen Description BLOOD RIGHT ARM  Final   Special Requests BOTTLES DRAWN AEROBIC ONLY 2CC  Final   Culture NO GROWTH < 12 HOURS  Final   Report Status PENDING  Incomplete  Culture, blood (routine x 2)     Status: None (Preliminary result)   Collection Time: 12-01-15  2:20 AM  Result Value Ref Range Status   Specimen Description BLOOD RIGHT ARM  Final   Special Requests   Final    BOTTLES DRAWN AEROBIC AND ANAEROBIC AEB 10CC ANA 2CC   Culture PENDING  Incomplete   Report Status PENDING  Incomplete  MRSA PCR Screening     Status: Abnormal   Collection Time: 2015-12-01  8:59 AM  Result Value Ref Range Status   MRSA by PCR POSITIVE (A) NEGATIVE Final    Comment:        The GeneXpert MRSA Assay (FDA approved for NASAL specimens only), is one component of a comprehensive MRSA colonization surveillance program. It is not intended to diagnose MRSA infection nor to guide or monitor treatment for MRSA infections. RESULT CALLED TO, READ BACK BY AND VERIFIED WITH: HAWKINS,M. AT 1222 ON Dec 01, 2015 BY BAUGHAM,M.      Basic Metabolic Panel:  Recent Labs  81/19/14 0220  NA 140  K 3.6  CL 103  CO2 28  GLUCOSE 111*  BUN 12  CREATININE 0.38*  CALCIUM 7.5*   Liver Function Tests:  Recent Labs  February 26, 2016 0220  AST 97*  ALT 74*  ALKPHOS 1015*  BILITOT 1.1  PROT 5.1*  ALBUMIN 1.9*     CBC:  Recent Labs  February 26, 2016 0220  WBC 22.0*  NEUTROABS 20.6*  HGB 10.6*  HCT 33.0*  MCV 75.9*  PLT 130*    Ct Chest W Contrast  11/24/2015  CLINICAL DATA:  Difficulty breathing. X-ray today showed diffuse left sided pneumonia. EXAM: CT CHEST WITH CONTRAST TECHNIQUE: Multidetector CT imaging of the chest was performed during intravenous contrast administration. CONTRAST:  75mL ISOVUE-300 IOPAMIDOL (ISOVUE-300) INJECTION 61% COMPARISON:  Chest 04/04/16 FINDINGS: Normal heart size. Coronary artery calcifications. Normal caliber thoracic aorta with calcifications. No aortic  dissection. Great vessel origins are patent. Central and segmental pulmonary arteries are well opacified without evidence of significant pulmonary embolus. Scattered lymph nodes in the mediastinum are not pathologically enlarged and are likely reactive. Esophagus is decompressed. Diffuse emphysematous changes throughout the lungs. Patchy airspace consolidation demonstrated in the left upper lung, the left lingula, and in both lower lungs as well as focally in the right upper lung. Changes likely to represent multifocal pneumonia. Bronchiectasis in the lung bases with secretions filling multiple bilateral lower lobe bronchi. This could indicate inspissated mucus due to infection or could indicate aspiration. Small amount of secretions demonstrated in the trachea. No pleural effusions. No pneumothorax. There is a nodule in the right middle lung measuring 9 mm in diameter. Follow-up in 3 months suggested to assess for inflammatory versus possible neoplastic nodule. Included portions of the upper abdominal organs demonstrate surgical absence of the gallbladder and fatty infiltration of the pancreas. Left adrenal gland nodule measuring 2.7 cm diameter. This is indeterminate. Consider further characterization with noncontrast CT or MRI. Diffuse bone demineralization with degenerative changes in the thoracic spine. Compression of several lower thoracic vertebrae. These were present on previous two view chest from 05/31/2015. IMPRESSION: Bilateral areas of airspace consolidation in the lungs most likely multifocal pneumonia. Secretions and bilateral lower lobe bronchi and in the trachea. This may be due to infection or aspiration. Indeterminate 9 mm nodule in the right middle lung. 2.7 cm nodule in the left adrenal gland. Follow-up in 3 months after treatment of acute process is suggested to exclude underlying neoplastic changes. Electronically Signed   By: Burman NievesWilliam  Stevens M.D.   On: 04/04/16 06:22   Dg Chest Portable  1 View  12/17/2015  CLINICAL DATA:  Dyspnea, wheezing and hypoxia tonight. EXAM: PORTABLE CHEST 1 VIEW COMPARISON:  11/13/2015 FINDINGS: The left upper lobe airspace opacity has become more confluently consolidated. The right lower lobe airspace opacity is partially cleared since 11/13/2015. Perihilar right lung opacity persists without significant difference compared to the prior study. No large effusions. No other significant interval change. IMPRESSION: Multifocal airspace opacities with variable interval changes, but worsened in the left upper lobe. This may represent pneumonia. Followup PA and lateral chest X-ray is recommended in 3-4 weeks following trial of antibiotic therapy to ensure resolution and exclude underlying malignancy. Electronically Signed   By: Ellery Plunkaniel R Mitchell M.D.   On: 04/04/16 01:14      Medications:   Impression:  Principal Problem:   HCAP (healthcare-associated pneumonia) Active Problems:   COPD (chronic obstructive pulmonary disease) (HCC)   Acute on chronic respiratory failure [facility](HCC)   Hospital acquired PNA     Plan: Continue Maxipime vancomycin and Zosyn. Obtain pulmonary consultation. Kane right upper quadrant abdominal ultrasound to assess liver and biliary tract. We'll administer honey thickened diet as  per speech pathology recommendations  Consultants: Pulmonary requested   Procedures   Antibiotics:                   Code Status: No code DO NOT INTUBATE  Family Communication:    Disposition Plan   Time spent: 30 minutes   LOS: 0 days   Christmas Faraci M   12/05/2015, 1:41 PM

## 2015-11-22 NOTE — Progress Notes (Signed)
Pharmacy Antibiotic Note  Tara Park is a 70 y.o. female admitted on 12/15/2015 with pneumonia.  Pharmacy has been consulted for Vancomycin dosing.  Plan: Vancomycin 750mg  IV every 12 hours.  Goal trough 15-20 mcg/mL.  Temp (24hrs), Avg:97.7 F (36.5 C), Min:97.7 F (36.5 C), Max:97.7 F (36.5 C)   Recent Labs Lab 11/16/15 0554 11/17/15 0644 11/18/15 0415 11/18/15 0847 03-05-16 0220  WBC  --   --  17.9*  --  22.0*  CREATININE 0.45 0.34* <0.30* 0.42* 0.38*  VANCOTROUGH  --   --   --  25*  --     Estimated Creatinine Clearance: 60.4 mL/min (by C-G formula based on Cr of 0.38).    No Known Allergies  Antimicrobials this admission: Vancomycin 5/2 >>  Cefepime 5/2 >>   Microbiology results: 5/2 NWG:NFAOZHYBCx:pending 5/2 MRSA PCR: pending  Thank you for allowing pharmacy to be a part of this patient's care. Tara Park, BS Pharm D, [place] Clinical Pharmacist Pager [phone] 12/17/2015 7:57 AM

## 2015-11-22 NOTE — H&P (Addendum)
[facility] History and Physical  Tara Park DJT:[id number] DOB: 10/08/1945    PCP:   Maricela Curet, MD   Chief Complaint: worsening PNA, recent admit.   HPI: Tara Park is an 70 y.o. female with hx of COPD, DM, prior tobacco use, hx of chronic leg edema, distolic CHF, HLD, CVA, recently admitted for multifocal PNA, when she was placed on IV Van/Zosyn for one week and discharged on Levoquin, brought back to the ER today as she was having more SOB and coughs.   During her last admission, there was concerns about aspiration, and thus speech was consulted, and Tara Park saw her and suggested that she be given D2/Honey thicken diet, and felt that she is at moderate risk for aspiration.  She had a CXR at the SNF which showed progression of the multifocal PNA, along with marked leukocytosis, with WBC of 24K.  Note that she was given Prednisone on her last hospitalization, and her WBC was 18K at that time.  She has alk phos of 1000, with LFTs in the 70's to 90's.  She had elevated alkphos in march at around 300's.  She has no abdominal pain, nausea, or vomiting.  She has had no fever or pain.   Rewiew of Systems:  Constitutional: Negative for malaise, fever and chills. No significant weight loss or weight gain Eyes: Negative for eye pain, redness and discharge, diplopia, visual changes, or flashes of light. ENMT: Negative for ear pain, hoarseness, nasal congestion, sinus pressure and sore throat. No headaches; tinnitus, drooling, or problem swallowing. Cardiovascular: Negative for chest pain, palpitations, diaphoresis, dyspnea and peripheral edema. ; No orthopnea, PND Respiratory: Negative for cough, hemoptysis, wheezing and stridor. No pleuritic chestpain. Gastrointestinal: Negative for nausea, vomiting, diarrhea, constipation, abdominal pain, melena, blood in stool, hematemesis, jaundice and rectal bleeding.    Genitourinary: Negative for frequency, dysuria, incontinence,flank  pain and hematuria; Musculoskeletal: Negative for back pain and neck pain. Negative for swelling and trauma.;  Skin: . Negative for pruritus, rash, abrasions, bruising and skin lesion.; ulcerations Neuro: Negative for headache, lightheadedness and neck stiffness. Negative for weakness, altered level of consciousness , altered mental status, extremity weakness, burning feet, involuntary movement, seizure and syncope.  Psych: negative for anxiety, depression, insomnia, tearfulness, panic attacks, hallucinations, paranoia, suicidal or homicidal ideation    Past Medical History  Diagnosis Date  . Diabetes mellitus without complication ([place])   . Hypertension   . Tobacco abuse     ? quit 1975  . Use of cane as ambulatory aid   . Leg edema, right     chronic  . Stroke ([place]) 1975  . CHF (congestive heart failure) ([place])   . COPD (chronic obstructive pulmonary disease) ([place])     on O2  . Anxiety   . Respiratory failure ([place])   . Hyperlipidemia   . Dysphagia     Past Surgical History  Procedure Laterality Date  . Cataract extraction    . Hip surgery    . Foot surgery      Medications:  HOME MEDS: Prior to Admission medications   Medication Sig Start Date End Date Taking? Authorizing Provider  albuterol (PROVENTIL) (2.5 MG/3ML) 0.083% nebulizer solution Take 2.5 mg by nebulization as needed for wheezing or shortness of breath. *may be given every 2 hours as needed for breathing/shortness of breath   Yes Historical Provider, MD  Amino Acids-Protein Hydrolys (FEEDING SUPPLEMENT, PRO-STAT 64,) LIQD Take 30 mLs by mouth 2 (two) times daily.  Yes Historical Provider, MD  aspirin EC 81 MG tablet Take 81 mg by mouth daily.   Yes Historical Provider, MD  clopidogrel (PLAVIX) 75 MG tablet Take 1 tablet (75 mg total) by mouth daily with breakfast. 06/10/15  Yes Lucia Gaskins, MD  digoxin (LANOXIN) 0.125 MG tablet Take 1 tablet (0.125 mg total) by mouth daily. 09/05/15  Yes Lucia Gaskins, MD   diltiazem (CARDIZEM) 30 MG tablet Take 1 tablet (30 mg total) by mouth 4 (four) times daily. 09/05/15  Yes Lucia Gaskins, MD  furosemide (LASIX) 20 MG tablet Take 20 mg by mouth daily.    Yes Historical Provider, MD  guaiFENesin (MUCINEX) 600 MG 12 hr tablet Take 1 tablet (600 mg total) by mouth 2 (two) times daily. 06/14/15  Yes Lavina Hamman, MD  HYDROcodone-acetaminophen (NORCO) 5-325 MG tablet Take one tablet by mouth every 6 hours as needed for pain. Max APAP 3gm/24hrs from all sources 10/19/15  Yes Estill Dooms, MD  insulin aspart (NOVOLOG) 100 UNIT/ML injection Inject into the skin 3 (three) times daily before meals. Sliding scale   Yes Historical Provider, MD  ipratropium-albuterol (DUONEB) 0.5-2.5 (3) MG/3ML SOLN Take 3 mLs by nebulization 3 (three) times daily.   Yes Historical Provider, MD  levofloxacin (LEVAQUIN) 500 MG tablet Take 500 mg by mouth daily.   Yes Historical Provider, MD  LORazepam (ATIVAN) 1 MG tablet Take 1 tablet (1 mg total) by mouth every 8 (eight) hours. 11/18/15  Yes Lucia Gaskins, MD  LORazepam (ATIVAN) 1 MG tablet 1 tab p.o. H.s. 11/18/15  Yes Lucia Gaskins, MD  Melatonin 3 MG TABS Take 3 mg by mouth at bedtime.   Yes Historical Provider, MD  metFORMIN (GLUCOPHAGE) 500 MG tablet Take 500 mg by mouth 2 (two) times daily after a meal. 500 mg BID after meals   Yes Historical Provider, MD  montelukast (SINGULAIR) 10 MG tablet Take 1 tablet (10 mg total) by mouth at bedtime. 11/11/15  Yes Hendricks Limes, MD  OXYGEN Inhale 2 L into the lungs daily.   Yes Historical Provider, MD  potassium chloride (K-DUR,KLOR-CON) 10 MEQ tablet Take 10 mEq by mouth daily.    Yes Historical Provider, MD  pravastatin (PRAVACHOL) 40 MG tablet Take 2 tablets (80 mg total) by mouth every evening. 06/14/15  Yes Lavina Hamman, MD  predniSONE (DELTASONE) 50 MG tablet Take 0.5 tablets (25 mg total) by mouth daily with breakfast. 11/18/15 11/25/15 Yes Lucia Gaskins, MD  senna (SENNA  LAXATIVE) 8.6 MG tablet Take 1 tablet as needed for mild constipation at bedtime   Yes Historical Provider, MD  traZODone (DESYREL) 50 MG tablet Take 1 tablet (50 mg total) by mouth at bedtime as needed for sleep. 06/14/15  Yes Lavina Hamman, MD  trypsin-balsam-castor oil Trixie Deis) ointment Apply 1 application topically daily. **Apply to coccyx, bilateral buttocls, and saccrum at every shift and as needed**   Yes Historical Provider, MD  fluticasone (FLOVENT HFA) 44 MCG/ACT inhaler Inhale 2 puffs into the lungs 2 (two) times daily.    Historical Provider, MD  ipratropium-albuterol (DUONEB) 0.5-2.5 (3) MG/3ML SOLN Take 3 mLs by nebulization 2 (two) times daily. 09/05/15   Lucia Gaskins, MD     Allergies:  No Known Allergies  Social History:   reports that she has quit smoking. Her smoking use included Cigarettes. She has never used smokeless tobacco. She reports that she does not drink alcohol or use illicit drugs.  Family History: Family History  Problem Relation  Age of Onset  . Transient ischemic attack Mother   . COPD Father   . Heart disease Brother      Physical Exam: Filed Vitals:   12/19/2015 0330 12/18/2015 0430 12/15/2015 0500 11/21/2015 0530  BP: 142/79 130/55 124/90 121/85  Pulse: 111  59 111  Temp:      Resp: _0 SpO2: 98%  97% 98%   Blood pressure 121/85, pulse 111, temperature 97.7 F (36.5 C), resp. rate 17, SpO2 98 %.  GEN:  Pleasant  patient lying in the stretcher in no acute distress; cooperative with exam. PSYCH:  alert and oriented x4; does not appear anxious or depressed; affect is appropriate. HEENT: Mucous membranes pink and anicteric; PERRLA; EOM intact; no cervical lymphadenopathy nor thyromegaly or carotid bruit; no JVD; There were no stridor. Neck is very supple. Breasts:: Not examined CHEST WALL: No tenderness CHEST: Normal respiration, scattered rhonchi.  Mild wheezing.  HEART: Regular rate and rhythm.  There are no murmur, rub, or gallops.    BACK: No kyphosis or scoliosis; no CVA tenderness ABDOMEN: soft and non-tender; no masses, no organomegaly, normal abdominal bowel sounds; no pannus; no intertriginous candida. There is no rebound and no distention. Rectal Exam: Not done EXTREMITIES: No bone or joint deformity; age-appropriate arthropathy of the hands and knees; no edema; no ulcerations.  There is no calf tenderness. Genitalia: not examined PULSES: 2+ and symmetric SKIN: Normal hydration no rash or ulceration CNS: Cranial nerves 2-12 grossly intact no focal lateralizing neurologic deficit.  Speech is fluent; uvula elevated with phonation, facial symmetry and tongue midline. DTR are normal bilaterally, cerebella exam is intact, barbinski is negative and strengths are equaled bilaterally.  No sensory loss.   Labs on Admission:  Basic Metabolic Panel:  Recent Labs Lab 11/16/15 0554 11/17/15 0644 11/18/15 0415 11/18/15 0847 12/13/2015 0220  NA 139 141 141 141 140  K 2.9* 3.4* 3.7 3.7 3.6  CL 104 108 109 109 103  CO2 _1 GLUCOSE 105* 112* 91 94 111*  BUN _2 CREATININE 0.45 0.34* <0.30* 0.42* 0.38*  CALCIUM 7.5* 7.5* 7.7* 7.8* 7.5*   Liver Function Tests:  Recent Labs Lab 12/02/2015 0220  AST 97*  ALT 74*  ALKPHOS 1015*  BILITOT 1.1  PROT 5.1*  ALBUMIN 1.9*   CBC:  Recent Labs Lab 11/15/15 0607 11/18/15 0415 11/21/2015 0220  WBC 14.1* 17.9* 22.0*  NEUTROABS 12.9* 16.4* 20.6*  HGB 10.1* 9.6* 10.6*  HCT 31.0* 30.3* 33.0*  MCV 75.4* 75.0* 75.9*  PLT 175 193 130*   Cardiac Enzymes:  Recent Labs Lab 12/07/2015 0220  TROPONINI 0.05*    CBG:  Recent Labs Lab 11/17/15 1100 11/17/15 1636 11/17/15 2054 11/18/15 0739 11/18/15 1128  GLUCAP 151* 293* 119* 70 122*     Radiological Exams on Admission: Dg Chest Portable 1 View  12/12/2015  CLINICAL DATA:  Dyspnea, wheezing and hypoxia tonight. EXAM: PORTABLE CHEST 1 VIEW COMPARISON:  11/13/2015 FINDINGS: The left upper lobe  airspace opacity has become more confluently consolidated. The right lower lobe airspace opacity is partially cleared since 11/13/2015. Perihilar right lung opacity persists without significant difference compared to the prior study. No large effusions. No other significant interval change. IMPRESSION: Multifocal airspace opacities with variable interval changes, but worsened in the left upper lobe. This may represent pneumonia. Followup PA and lateral chest X-ray is recommended in 3-4 weeks following trial of antibiotic therapy to ensure resolution and  exclude underlying malignancy. Electronically Signed   By: Andreas Newport M.D.   On: 12/01/2015 01:14   Assessment/Plan Present on Admission:  . COPD (chronic obstructive pulmonary disease) ([place]) . HCAP (healthcare-associated pneumonia) . Acute on chronic respiratory failure ([place]) . Hospital acquired PNA  PLAN:  HCAP:  I suspect she has aspiration.  I am not convinced she has hospital acquired bacterial infection, as she had a whole week of IV Van/Zosyn already, but will continue them at this time.  Will need to obtain a CT with IV contrast of the chest to exclude post obstructive PNA.  Speech has seen her, and had recommended D2/Honey thicknen as she has  Moderate risk of recurrent aspiration.  Will make her NPO for now, but will need to make a decision regarding her aspiration (Ice cream is not thickened liquid).  Consider pulmonary consultation, but will defer to the judgement of her PCP.   Isolated elevation of alk phosphatase:  Suspicious for Paget's disease.  Will obtain skull film.    CHF:  She had prior ECHO showing hyperdynamic heart with EF 70 percent.  It was not systolic CHF.   COPD: Will continue with her meds.   DM: Will give SSI.     Other plans as per orders. Code Status: Partial Code.  No intubation.    Orvan Falconer, MD. FACP [facility] Pager [phone] 7pm to 7am.  12/12/2015, 6:03 AM

## 2015-11-22 NOTE — Progress Notes (Signed)
Initial Nutrition Assessment   INTERVENTION:  Magic cup TID with meals, each supplement provides 290 kcal and 9 grams of protein   Recommend Palliative team consult to address pt health care goals.   NUTRITION DIAGNOSIS:  Inadequate oral intake related to poor appetite as evidenced by recent meal intake 0-50%.   GOAL:  Pt to meet >/= 90% of their estimated nutrition needs    MONITOR:  Po intake, labs and wt trends    REASON FOR ASSESSMENT:   Malnutrition Screening Tool    ASSESSMENT:   Tara Park is an 69 y.o. female with hx of COPD, DM, prior tobacco use, hx of chronic leg edema, distolic CHF, HLD, CVA, recently admitted for multifocal PNA, when she was placed on IV Van/Zosyn for one week and discharged on Levoquin, brought back to the ER today as she was having more SOB and coughs. During her last admission, there was concerns about aspiration, and thus speech was consulted, and Dabney Porter saw her and suggested that she be given D2/Honey thicken diet, and felt that she is at moderate risk for aspiration  Pt recently discharged to SNF. She has now 6 admissions in the past 6 months. Her intake has been poor to fair at best 1-50% of meals. She has a chronic swallow problem and has been on a texture modified diet with honey thick liquids. She is usually able to feed herself but may need assistance/encouragment to maximize po intake due to her acute illness.  Her nutrition status is compromised with her repeated hospitalizations and acute/chronic problems.  NFPE: she has severe upper body wasting and hx of chronic edema  abnormal labs: glucose 111, Alk Phos-1015 (high), Albumin 1.9, WBC's 22.0. BNP-118.0   Diet Order:  DIET DYS 2 Room service appropriate?: Yes; Fluid consistency:: Honey Thick  Skin:   stage II blister? Left heel  Last BM:   5/1   Height:   Ht Readings from Last 1 Encounters:  12/14/2015 5' 6" (1.676 m)   Weight:   Wt Readings from Last 1 Encounters:   12/13/2015 137 lb (62.143 kg)    Ideal Body Weight:  59 kg  BMI:  Body mass index is 22.12 kg/(m^2).  Estimated Nutritional Needs:   Kcal:  1550-1830  Protein:  90 gr  Fluid:  1.5 liters daily  EDUCATION NEEDS: none identified at this time    Lynn  MS,RD,CSG,LDN Office: #[phone] Pager: #[phone]   

## 2015-11-22 NOTE — Progress Notes (Signed)
CRITICAL VALUE ALERT  Critical value received:  MRSA positive  Date of notification:  12/06/2015  Time of notification:  1224  Critical value read back:Yes.    Nurse who received alert:  Fara ChuteMary Beth Nkenge Sonntag, RN  MD notified (1st page):  Dr. Janna ArchonDiego  Time of first page:    MD notified (2nd page):  Time of second page:  Responding MD:    Time MD responded:    Protocol initiated.  Patient's nurse made aware.

## 2015-11-22 NOTE — ED Provider Notes (Signed)
CSN: [id number]     Arrival date & time 2015-12-02  0028 History   First MD Initiated Contact with Patient 02-Dec-2015 0102   Chief Complaint  Patient presents with  . Shortness of Breath   Level V caveat for age  (Consider location/radiation/quality/duration/timing/severity/associated sxs/prior Treatment) HPI patient was discharged from the hospital on December 28 for pneumonia. She had received vancomycin and Zosyn IV for approximate 7 days. When she was discharged from hospital she was discharged on Levaquin 500 mg once a day. She still has 4 more doses left on that. Patient states she's having trouble breathing. She denies coughing. She was sent from her nursing home after having a x-ray today that showed diffuse left-sided pneumonia.   PCP Dr Janna Arch [facility] Physician Dr Eden Emms  Past Medical History  Diagnosis Date  . Diabetes mellitus without complication (HCC)   . Hypertension   . Tobacco abuse     ? quit 1975  . Use of cane as ambulatory aid   . Leg edema, right     chronic  . Stroke (HCC) 1975  . CHF (congestive heart failure) (HCC)   . COPD (chronic obstructive pulmonary disease) (HCC)     on O2  . Anxiety   . Respiratory failure (HCC)   . Hyperlipidemia   . Dysphagia    Past Surgical History  Procedure Laterality Date  . Cataract extraction    . Hip surgery    . Foot surgery     Family History  Problem Relation Age of Onset  . Transient ischemic attack Mother   . COPD Father   . Heart disease Brother    Social History  Substance Use Topics  . Smoking status: Former Smoker    Types: Cigarettes  . Smokeless tobacco: Never Used     Comment: She stated she quit in 1975 at the time of her stroke; the history is somewhat questionable  . Alcohol Use: No   Lives in [facility]    No data available     Review of Systems  Unable to perform ROS: Age      Allergies  Review of patient's allergies indicates no known allergies.  Home Medications   Prior to  Admission medications   Medication Sig Start Date End Date Taking? Authorizing Provider  albuterol (PROVENTIL) (2.5 MG/3ML) 0.083% nebulizer solution Take 2.5 mg by nebulization as needed for wheezing or shortness of breath. *may be given every 2 hours as needed for breathing/shortness of breath   Yes Historical Provider, MD  Amino Acids-Protein Hydrolys (FEEDING SUPPLEMENT, PRO-STAT 64,) LIQD Take 30 mLs by mouth 2 (two) times daily.   Yes Historical Provider, MD  aspirin EC 81 MG tablet Take 81 mg by mouth daily.   Yes Historical Provider, MD  clopidogrel (PLAVIX) 75 MG tablet Take 1 tablet (75 mg total) by mouth daily with breakfast. 06/10/15  Yes Oval Linsey, MD  digoxin (LANOXIN) 0.125 MG tablet Take 1 tablet (0.125 mg total) by mouth daily. 09/05/15  Yes Oval Linsey, MD  diltiazem (CARDIZEM) 30 MG tablet Take 1 tablet (30 mg total) by mouth 4 (four) times daily. 09/05/15  Yes Oval Linsey, MD  furosemide (LASIX) 20 MG tablet Take 20 mg by mouth daily.    Yes Historical Provider, MD  guaiFENesin (MUCINEX) 600 MG 12 hr tablet Take 1 tablet (600 mg total) by mouth 2 (two) times daily. 06/14/15  Yes Rolly Salter, MD  HYDROcodone-acetaminophen (NORCO) 5-325 MG tablet Take one tablet by  mouth every 6 hours as needed for pain. Max APAP 3gm/24hrs from all sources 10/19/15  Yes Kimber RelicArthur G Green, MD  insulin aspart (NOVOLOG) 100 UNIT/ML injection Inject into the skin 3 (three) times daily before meals. Sliding scale   Yes Historical Provider, MD  ipratropium-albuterol (DUONEB) 0.5-2.5 (3) MG/3ML SOLN Take 3 mLs by nebulization 3 (three) times daily.   Yes Historical Provider, MD  levofloxacin (LEVAQUIN) 500 MG tablet Take 500 mg by mouth daily.   Yes Historical Provider, MD  LORazepam (ATIVAN) 1 MG tablet Take 1 tablet (1 mg total) by mouth every 8 (eight) hours. 11/18/15  Yes Oval Linseyichard Dondiego, MD  LORazepam (ATIVAN) 1 MG tablet 1 tab p.o. H.s. 11/18/15  Yes Oval Linseyichard Dondiego, MD  Melatonin 3 MG  TABS Take 3 mg by mouth at bedtime.   Yes Historical Provider, MD  metFORMIN (GLUCOPHAGE) 500 MG tablet Take 500 mg by mouth 2 (two) times daily after a meal. 500 mg BID after meals   Yes Historical Provider, MD  montelukast (SINGULAIR) 10 MG tablet Take 1 tablet (10 mg total) by mouth at bedtime. 11/11/15  Yes Pecola LawlessWilliam F Hopper, MD  OXYGEN Inhale 2 L into the lungs daily.   Yes Historical Provider, MD  potassium chloride (K-DUR,KLOR-CON) 10 MEQ tablet Take 10 mEq by mouth daily.    Yes Historical Provider, MD  pravastatin (PRAVACHOL) 40 MG tablet Take 2 tablets (80 mg total) by mouth every evening. 06/14/15  Yes Rolly SalterPranav M Patel, MD  predniSONE (DELTASONE) 50 MG tablet Take 0.5 tablets (25 mg total) by mouth daily with breakfast. 11/18/15 11/25/15 Yes Oval Linseyichard Dondiego, MD  senna (SENNA LAXATIVE) 8.6 MG tablet Take 1 tablet as needed for mild constipation at bedtime   Yes Historical Provider, MD  traZODone (DESYREL) 50 MG tablet Take 1 tablet (50 mg total) by mouth at bedtime as needed for sleep. 06/14/15  Yes Rolly SalterPranav M Patel, MD  trypsin-balsam-castor oil [place](XENADERM) ointment Apply 1 application topically daily. **Apply to coccyx, bilateral buttocls, and saccrum at every shift and as needed**   Yes Historical Provider, MD  fluticasone (FLOVENT HFA) 44 MCG/ACT inhaler Inhale 2 puffs into the lungs 2 (two) times daily.    Historical Provider, MD  ipratropium-albuterol (DUONEB) 0.5-2.5 (3) MG/3ML SOLN Take 3 mLs by nebulization 2 (two) times daily. 09/05/15   Oval Linseyichard Dondiego, MD   BP 126/74 mmHg  Pulse 102  Temp(Src) 97.7 F (36.5 C)  Resp 15  SpO2 99%  Vital signs normal except for tachycardia  Physical Exam  Constitutional: She is oriented to person, place, and time.  Non-toxic appearance. She does not appear ill. No distress.  Frail elderly female who appears much older than her stated age  HENT:  Head: Normocephalic and atraumatic.  Right Ear: External ear normal.  Left Ear: External ear normal.   Nose: Nose normal. No mucosal edema or rhinorrhea.  Mouth/Throat: Mucous membranes are normal. No dental abscesses or uvula swelling.  Tongue is dry  Eyes: Conjunctivae and EOM are normal. Pupils are equal, round, and reactive to light.  Neck: Normal range of motion and full passive range of motion without pain. Neck supple.  Cardiovascular: Normal rate, regular rhythm and normal heart sounds.  Exam reveals no gallop and no friction rub.   No murmur heard. Pulmonary/Chest: She is in respiratory distress. She has decreased breath sounds. She has no wheezes. She has no rhonchi. She has no rales. She exhibits no tenderness and no crepitus.  Abdominal: Soft. Normal appearance and  bowel sounds are normal. She exhibits no distension. There is no tenderness. There is no rebound and no guarding.  Musculoskeletal: Normal range of motion. She exhibits no edema or tenderness.  Moves all extremities well.   Neurological: She is alert and oriented to person, place, and time. She has normal strength. No cranial nerve deficit.  Skin: Skin is warm, dry and intact. No rash noted. No erythema. No pallor.  Patient is noted to have a lot of bruising of her upper chest of uncertain etiology. She also has her left arm and both lower legs wrapped.  Psychiatric: She has a normal mood and affect. Her speech is normal and behavior is normal. Her mood appears not anxious.  Nursing note and vitals reviewed.   ED Course  Procedures (including critical care time)   Medications  sodium chloride 0.9 % bolus 1,000 mL (1,000 mLs Intravenous New Bag/Given 12/15/2015 0251)  piperacillin-tazobactam (ZOSYN) IVPB 3.375 g (0 g Intravenous Stopped 12/14/2015 0315)  vancomycin (VANCOCIN) IVPB 750 mg/150 ml premix (750 mg Intravenous Given 12/12/2015 0325)  ipratropium-albuterol (DUONEB) 0.5-2.5 (3) MG/3ML nebulizer solution 3 mL (3 mLs Nebulization Given 11/26/2015 0253)  albuterol (PROVENTIL) (2.5 MG/3ML) 0.083% nebulizer solution 2.5 mg (2.5  mg Nebulization Given 12/08/2015 0253)    After reviewing patient's x-ray she was given IV Zosyn and IV vancomycin. She also was given a nebulizer treatment. After reviewing all of patient's testing was thought she probably should be readmitted to the hospital.  05:06 AM Dr Conley Rolls, hospitalist will see patient and admit   Labs Review Results for orders placed or performed during the hospital encounter of 12/07/2015  Culture, blood (routine x 2)  Result Value Ref Range   Specimen Description BLOOD RIGHT ARM    Special Requests BOTTLES DRAWN AEROBIC ONLY 2CC    Culture PENDING    Report Status PENDING   Culture, blood (routine x 2)  Result Value Ref Range   Specimen Description BLOOD RIGHT ARM    Special Requests      BOTTLES DRAWN AEROBIC AND ANAEROBIC AEB 10CC ANA 2CC   Culture PENDING    Report Status PENDING   Comprehensive metabolic panel  Result Value Ref Range   Sodium 140 135 - 145 mmol/L   Potassium 3.6 3.5 - 5.1 mmol/L   Chloride 103 101 - 111 mmol/L   CO2 28 22 - 32 mmol/L   Glucose, Bld 111 (H) 65 - 99 mg/dL   BUN 12 6 - 20 mg/dL   Creatinine, Ser 1.61 (L) 0.44 - 1.00 mg/dL   Calcium 7.5 (L) 8.9 - 10.3 mg/dL   Total Protein 5.1 (L) 6.5 - 8.1 g/dL   Albumin 1.9 (L) 3.5 - 5.0 g/dL   AST 97 (H) 15 - 41 U/L   ALT 74 (H) 14 - 54 U/L   Alkaline Phosphatase 1015 (H) 38 - 126 U/L   Total Bilirubin 1.1 0.3 - 1.2 mg/dL   GFR calc non Af Amer >60 >60 mL/min   GFR calc Af Amer >60 >60 mL/min   Anion gap 9 5 - 15  CBC with Differential  Result Value Ref Range   WBC 22.0 (H) 4.0 - 10.5 K/uL   RBC 4.35 3.87 - 5.11 MIL/uL   Hemoglobin 10.6 (L) 12.0 - 15.0 g/dL   HCT 09.6 (L) 04.5 - 40.9 %   MCV 75.9 (L) 78.0 - 100.0 fL   MCH 24.4 (L) 26.0 - 34.0 pg   MCHC 32.1 30.0 - 36.0 g/dL  RDW 19.7 (H) 11.5 - 15.5 %   Platelets 130 (L) 150 - 400 K/uL   Neutrophils Relative % 93 %   Neutro Abs 20.6 (H) 1.7 - 7.7 K/uL   Lymphocytes Relative 5 %   Lymphs Abs 1.1 0.7 - 4.0 K/uL   Monocytes  Relative 2 %   Monocytes Absolute 0.4 0.1 - 1.0 K/uL   Eosinophils Relative 0 %   Eosinophils Absolute 0.0 0.0 - 0.7 K/uL   Basophils Relative 0 %   Basophils Absolute 0.0 0.0 - 0.1 K/uL   WBC Morphology ATYPICAL LYMPHOCYTES    RBC Morphology POLYCHROMASIA PRESENT   Brain natriuretic peptide  Result Value Ref Range   B Natriuretic Peptide 118.0 (H) 0.0 - 100.0 pg/mL  Troponin I  Result Value Ref Range   Troponin I 0.05 (H) <0.031 ng/mL   Laboratory interpretation all normal except leukocytosis (slightly higher than when left hospital), persistent elevated troponin      Imaging Review Dg Chest Portable 1 View  12/19/2015  CLINICAL DATA:  Dyspnea, wheezing and hypoxia tonight. EXAM: PORTABLE CHEST 1 VIEW COMPARISON:  11/13/2015 FINDINGS: The left upper lobe airspace opacity has become more confluently consolidated. The right lower lobe airspace opacity is partially cleared since 11/13/2015. Perihilar right lung opacity persists without significant difference compared to the prior study. No large effusions. No other significant interval change. IMPRESSION: Multifocal airspace opacities with variable interval changes, but worsened in the left upper lobe. This may represent pneumonia. Followup PA and lateral chest X-ray is recommended in 3-4 weeks following trial of antibiotic therapy to ensure resolution and exclude underlying malignancy. Electronically Signed   By: Ellery Plunk M.D.   On: 12/06/2015 01:14   I have personally reviewed and evaluated these images and lab results as part of my medical decision-making.   EKG Interpretation   Date/Time:  Tuesday Nov 22 2015 00:58:12 EDT Ventricular Rate:  97 PR Interval:  173 QRS Duration: 80 QT Interval:  402 QTC Calculation: 511 R Axis:   81 Text Interpretation:  Sinus rhythm Multiple premature complexes,  supraven  Borderline right axis deviation Borderline repolarization abnormality  Borderline prolonged QT interval Baseline  wander No significant change  since last tracing 13 Nov 2015 Confirmed by Nyazia Canevari  MD-I, Layanna Charo ([id number]) on  12/06/2015 1:10:08 AM      MDM   Final diagnoses:  Healthcare-associated pneumonia   Plan admission  Devoria Albe, MD, Concha Pyo, MD 11/24/2015 [phone]

## 2015-11-22 NOTE — ED Notes (Signed)
Orene DesanctisAdvised Joan at, [facility] that pt was being admitted back to the hospital.

## 2015-11-22 NOTE — ED Notes (Signed)
Pt is from [facility] and sent over by pcp for pneumonia. Pt was 95% on room air.

## 2015-11-23 LAB — BLOOD GAS, ARTERIAL
Acid-Base Excess: 2.6 mmol/L — ABNORMAL HIGH (ref 0.0–2.0)
BICARBONATE: 27 meq/L — AB (ref 20.0–24.0)
Drawn by: 23534
O2 Content: 2 L/min
O2 Saturation: 95.1 %
PCO2 ART: 32.7 mmHg — AB (ref 35.0–45.0)
PH ART: 7.506 — AB (ref 7.350–7.450)
pO2, Arterial: 75.2 mmHg — ABNORMAL LOW (ref 80.0–100.0)

## 2015-11-23 LAB — HEPATIC FUNCTION PANEL
ALT: 58 U/L — AB (ref 14–54)
AST: 71 U/L — ABNORMAL HIGH (ref 15–41)
Albumin: 1.7 g/dL — ABNORMAL LOW (ref 3.5–5.0)
Alkaline Phosphatase: 735 U/L — ABNORMAL HIGH (ref 38–126)
BILIRUBIN DIRECT: 0.5 mg/dL (ref 0.1–0.5)
BILIRUBIN TOTAL: 1.2 mg/dL (ref 0.3–1.2)
Indirect Bilirubin: 0.7 mg/dL (ref 0.3–0.9)
Total Protein: 4.6 g/dL — ABNORMAL LOW (ref 6.5–8.1)

## 2015-11-23 LAB — CBC WITH DIFFERENTIAL/PLATELET
BASOS ABS: 0 10*3/uL (ref 0.0–0.1)
BASOS PCT: 0 %
EOS ABS: 0 10*3/uL (ref 0.0–0.7)
EOS PCT: 0 %
HCT: 28.2 % — ABNORMAL LOW (ref 36.0–46.0)
Hemoglobin: 9.2 g/dL — ABNORMAL LOW (ref 12.0–15.0)
Lymphocytes Relative: 5 %
Lymphs Abs: 1.3 10*3/uL (ref 0.7–4.0)
MCH: 24.4 pg — ABNORMAL LOW (ref 26.0–34.0)
MCHC: 32.6 g/dL (ref 30.0–36.0)
MCV: 74.8 fL — ABNORMAL LOW (ref 78.0–100.0)
MONO ABS: 0.4 10*3/uL (ref 0.1–1.0)
MONOS PCT: 2 %
NEUTROS ABS: 24.4 10*3/uL — AB (ref 1.7–7.7)
Neutrophils Relative %: 94 %
PLATELETS: 164 10*3/uL (ref 150–400)
RBC: 3.77 MIL/uL — ABNORMAL LOW (ref 3.87–5.11)
RDW: 20.2 % — AB (ref 11.5–15.5)
WBC: 26.1 10*3/uL — ABNORMAL HIGH (ref 4.0–10.5)

## 2015-11-23 LAB — GLUCOSE, CAPILLARY
GLUCOSE-CAPILLARY: 48 mg/dL — AB (ref 65–99)
GLUCOSE-CAPILLARY: 82 mg/dL (ref 65–99)
GLUCOSE-CAPILLARY: 142 mg/dL — AB (ref 65–99)
Glucose-Capillary: 83 mg/dL (ref 65–99)
Glucose-Capillary: 131 mg/dL — ABNORMAL HIGH (ref 65–99)

## 2015-11-23 MED ORDER — DEXTROSE 50 % IV SOLN
INTRAVENOUS | Status: AC
  Administered 2015-11-23: 08:00:00
  Filled 2015-11-23: qty 50

## 2015-11-23 MED ORDER — METHYLPREDNISOLONE SODIUM SUCC 40 MG IJ SOLR
40.0000 mg | Freq: Two times a day (BID) | INTRAMUSCULAR | Status: DC
  Administered 2015-11-23 – 2015-11-26 (×8): 40 mg via INTRAVENOUS
  Filled 2015-11-23 (×9): qty 1

## 2015-11-23 NOTE — Progress Notes (Addendum)
Pt has been going down into 30s on telemetry with complete heart block.  Pt not alert but responsive to stimuli.  Dr Janna Archondiego notified and per Dr Janna Archondiego pt is NO CODE.  Per pt son, Tara Park, pt wishes to be partial code, no intubation.  Notified Dr Janna Archondiego of speaking with family.

## 2015-11-23 NOTE — Progress Notes (Signed)
Hypoglycemic Event  CBG 48 :@ 0728  Treatment: 50 ml D 50 IV  Symptoms: lethargic  Follow-up CBG: Time:0754 CBG Result:83  Possible Reasons for Event: poor po intake  Comments/MD notified:Dr. Janna Archondiego paged    Tara Park, Tara Park

## 2015-11-23 NOTE — Consult Note (Signed)
Consult requested by: Dr. Delbert Harnesson Diego Consult requested for healthcare associated pneumonia:  HPI: This is a 70 year old who was admitted with pneumonia. She has been felt to be at moderate risk of aspiration. She does live in a skilled care facility. She was found to have multifocal pneumonia. She's been coughing and short of breath and congested. This morning she is poorly responsive which apparently started yesterday afternoon. Her blood sugar was low this morning but has been replaced. She is still poorly responsive. She is not able to give me any history  Past Medical History  Diagnosis Date  . Diabetes mellitus without complication (HCC)   . Hypertension   . Tobacco abuse     ? quit 1975  . Use of cane as ambulatory aid   . Leg edema, right     chronic  . Stroke (HCC) 1975  . CHF (congestive heart failure) (HCC)   . COPD (chronic obstructive pulmonary disease) (HCC)     on O2  . Anxiety   . Respiratory failure (HCC)   . Hyperlipidemia   . Dysphagia      Family History  Problem Relation Age of Onset  . Transient ischemic attack Mother   . COPD Father   . Heart disease Brother      Social History   Social History  . Marital Status: Divorced    Spouse Name: N/A  . Number of Children: N/A  . Years of Education: N/A   Social History Main Topics  . Smoking status: Former Smoker    Types: Cigarettes  . Smokeless tobacco: Never Used     Comment: She stated she quit in 1975 at the time of her stroke; the history is somewhat questionable  . Alcohol Use: No  . Drug Use: No  . Sexual Activity: No   Other Topics Concern  . None   Social History Narrative     ROS: Not obtainable    Objective: Vital signs in last 24 hours: Temp:  [97.5 F (36.4 C)-98 F (36.7 C)] 98 F (36.7 C) (05/03 0625) Pulse Rate:  [65-101] 67 (05/03 0742) Resp:  [20-22] 20 (05/03 0742) BP: (86-122)/(57-89) 86/57 mmHg (05/03 0742) SpO2:  [91 %-100 %] 100 % (05/03 0826) Weight:  [62.143  kg (137 lb)] 62.143 kg (137 lb) (05/02 1629) Weight change:  Last BM Date: 11/21/15  Intake/Output from previous day: 05/02 0701 - 05/03 0700 In: 200 [IV Piggyback:200] Out: -   PHYSICAL EXAM She is poorly responsive. She does to some extent respond to commands. She looks pale. Pupils react mucous membranes are dry her neck is supple without masses her chest shows rhonchi bilaterally no wheezing now. Her heart is regular without gallop. Abdomen is soft she has what looks like some third spacing of fluid. Central nervous system exam shows that she is poorly responsive  Lab Results: Basic Metabolic Panel:  Recent Labs  16/04/9608/26/2017 0220  NA 140  K 3.6  CL 103  CO2 28  GLUCOSE 111*  BUN 12  CREATININE 0.38*  CALCIUM 7.5*   Liver Function Tests:  Recent Labs  04/17/2016 0220 11/23/15 0615  AST 97* 71*  ALT 74* 58*  ALKPHOS 1015* 735*  BILITOT 1.1 1.2  PROT 5.1* 4.6*  ALBUMIN 1.9* 1.7*   No results for input(s): LIPASE, AMYLASE in the last 72 hours. No results for input(s): AMMONIA in the last 72 hours. CBC:  Recent Labs  04/17/2016 0220 11/23/15 0615  WBC 22.0* 26.1*  NEUTROABS 20.6*  24.4*  HGB 10.6* 9.2*  HCT 33.0* 28.2*  MCV 75.9* 74.8*  PLT 130* 164   Cardiac Enzymes:  Recent Labs  12/13/2015 0220  TROPONINI 0.05*   BNP: No results for input(s): PROBNP in the last 72 hours. D-Dimer: No results for input(s): DDIMER in the last 72 hours. CBG:  Recent Labs  12/14/2015 0809 12/10/2015 1201 11/30/2015 1650 12/07/2015 2012 11/23/15 0728 11/23/15 0752  GLUCAP 91 82 101* 97 48* 83   Hemoglobin A1C: No results for input(s): HGBA1C in the last 72 hours. Fasting Lipid Panel: No results for input(s): CHOL, HDL, LDLCALC, TRIG, CHOLHDL, LDLDIRECT in the last 72 hours. Thyroid Function Tests: No results for input(s): TSH, T4TOTAL, FREET4, T3FREE, THYROIDAB in the last 72 hours. Anemia Panel: No results for input(s): VITAMINB12, FOLATE, FERRITIN, TIBC, IRON,  RETICCTPCT in the last 72 hours. Coagulation: No results for input(s): LABPROT, INR in the last 72 hours. Urine Drug Screen: Drugs of Abuse     Component Value Date/Time   LABOPIA NONE DETECTED 05/31/2015 2020   COCAINSCRNUR NONE DETECTED 05/31/2015 2020   LABBENZ NONE DETECTED 05/31/2015 2020   AMPHETMU NONE DETECTED 05/31/2015 2020   THCU POSITIVE* 05/31/2015 2020   LABBARB NONE DETECTED 05/31/2015 2020    Alcohol Level: No results for input(s): ETH in the last 72 hours. Urinalysis: No results for input(s): COLORURINE, LABSPEC, PHURINE, GLUCOSEU, HGBUR, BILIRUBINUR, KETONESUR, PROTEINUR, UROBILINOGEN, NITRITE, LEUKOCYTESUR in the last 72 hours.  Invalid input(s): APPERANCEUR Misc. Labs:   ABGS: No results for input(s): PHART, PO2ART, TCO2, HCO3 in the last 72 hours.  Invalid input(s): PCO2   MICROBIOLOGY: Recent Results (from the past 240 hour(s))  Blood culture (routine x 2)     Status: None   Collection Time: 11/13/15  9:35 AM  Result Value Ref Range Status   Specimen Description BLOOD RIGHT ANTECUBITAL DRAWN BY RN  Final   Special Requests BOTTLES DRAWN AEROBIC AND ANAEROBIC 6CC  Final   Culture NO GROWTH 5 DAYS  Final   Report Status 11/18/2015 FINAL  Final  Blood culture (routine x 2)     Status: None   Collection Time: 11/13/15  9:47 AM  Result Value Ref Range Status   Specimen Description BLOOD RIGHT HAND DRAWN BY RN  Final   Special Requests BOTTLES DRAWN AEROBIC ONLY 6CC  Final   Culture NO GROWTH 5 DAYS  Final   Report Status 11/18/2015 FINAL  Final  Culture, blood (routine x 2)     Status: None (Preliminary result)   Collection Time: 11/24/2015  2:10 AM  Result Value Ref Range Status   Specimen Description BLOOD RIGHT ARM  Final   Special Requests BOTTLES DRAWN AEROBIC ONLY 2CC  Final   Culture NO GROWTH < 12 HOURS  Final   Report Status PENDING  Incomplete  Culture, blood (routine x 2)     Status: None (Preliminary result)   Collection Time: 12/15/2015   2:20 AM  Result Value Ref Range Status   Specimen Description BLOOD RIGHT ARM  Final   Special Requests   Final    BOTTLES DRAWN AEROBIC AND ANAEROBIC AEB 10CC ANA 2CC   Culture PENDING  Incomplete   Report Status PENDING  Incomplete  MRSA PCR Screening     Status: Abnormal   Collection Time: 12/04/2015  8:59 AM  Result Value Ref Range Status   MRSA by PCR POSITIVE (A) NEGATIVE Final    Comment:        The GeneXpert MRSA Assay (  FDA approved for NASAL specimens only), is one component of a comprehensive MRSA colonization surveillance program. It is not intended to diagnose MRSA infection nor to guide or monitor treatment for MRSA infections. RESULT CALLED TO, READ BACK BY AND VERIFIED WITH: Destenee Guerry,M. AT 1222 ON 2015-12-08 BY BAUGHAM,M.     Studies/Results: Dg Skull 1-3 Views  12/08/2015  CLINICAL DATA:  Elevated alkaline phosphatase. EXAM: SKULL - 1-3 VIEW COMPARISON:  Head CT dated 06/12/2015. FINDINGS: Stable normal appearing skull.  The patient is edentulous. IMPRESSION: Normal appearing skull. Electronically Signed   By: Beckie Salts M.D.   On: 12/08/15 15:50   Ct Chest W Contrast  Dec 08, 2015  CLINICAL DATA:  Difficulty breathing. X-ray today showed diffuse left sided pneumonia. EXAM: CT CHEST WITH CONTRAST TECHNIQUE: Multidetector CT imaging of the chest was performed during intravenous contrast administration. CONTRAST:  75mL ISOVUE-300 IOPAMIDOL (ISOVUE-300) INJECTION 61% COMPARISON:  Chest 2015/12/08 FINDINGS: Normal heart size. Coronary artery calcifications. Normal caliber thoracic aorta with calcifications. No aortic dissection. Great vessel origins are patent. Central and segmental pulmonary arteries are well opacified without evidence of significant pulmonary embolus. Scattered lymph nodes in the mediastinum are not pathologically enlarged and are likely reactive. Esophagus is decompressed. Diffuse emphysematous changes throughout the lungs. Patchy airspace consolidation  demonstrated in the left upper lung, the left lingula, and in both lower lungs as well as focally in the right upper lung. Changes likely to represent multifocal pneumonia. Bronchiectasis in the lung bases with secretions filling multiple bilateral lower lobe bronchi. This could indicate inspissated mucus due to infection or could indicate aspiration. Small amount of secretions demonstrated in the trachea. No pleural effusions. No pneumothorax. There is a nodule in the right middle lung measuring 9 mm in diameter. Follow-up in 3 months suggested to assess for inflammatory versus possible neoplastic nodule. Included portions of the upper abdominal organs demonstrate surgical absence of the gallbladder and fatty infiltration of the pancreas. Left adrenal gland nodule measuring 2.7 cm diameter. This is indeterminate. Consider further characterization with noncontrast CT or MRI. Diffuse bone demineralization with degenerative changes in the thoracic spine. Compression of several lower thoracic vertebrae. These were present on previous two view chest from 05/31/2015. IMPRESSION: Bilateral areas of airspace consolidation in the lungs most likely multifocal pneumonia. Secretions and bilateral lower lobe bronchi and in the trachea. This may be due to infection or aspiration. Indeterminate 9 mm nodule in the right middle lung. 2.7 cm nodule in the left adrenal gland. Follow-up in 3 months after treatment of acute process is suggested to exclude underlying neoplastic changes. Electronically Signed   By: Burman Nieves M.D.   On: 12/08/15 06:22   Dg Chest Portable 1 View  2015-12-08  CLINICAL DATA:  Dyspnea, wheezing and hypoxia tonight. EXAM: PORTABLE CHEST 1 VIEW COMPARISON:  11/13/2015 FINDINGS: The left upper lobe airspace opacity has become more confluently consolidated. The right lower lobe airspace opacity is partially cleared since 11/13/2015. Perihilar right lung opacity persists without significant difference  compared to the prior study. No large effusions. No other significant interval change. IMPRESSION: Multifocal airspace opacities with variable interval changes, but worsened in the left upper lobe. This may represent pneumonia. Followup PA and lateral chest X-ray is recommended in 3-4 weeks following trial of antibiotic therapy to ensure resolution and exclude underlying malignancy. Electronically Signed   By: Ellery Plunk M.D.   On: 2015/12/08 01:14   US Abdomen Limited Ruq  08-Dec-2015  CLINICAL DATA:  Increased alkaline phosphatase. Patient is status  post cholecystectomy 30 years ago. EXAM: US ABDOMEN LIMITED - RIGHT UPPER QUADRANT COMPARISON:  None. FINDINGS: Gallbladder: Surgically removed. Common bile duct: Diameter: 9 mm, postsurgical prominence. Liver: No focal lesion identified. Within normal limits in parenchymal echogenicity. IMPRESSION: No acute abnormality. Prior cholecystectomy. Postsurgical dilatation of common bile duct. Electronically Signed   By: Sherian Rein M.D.   On: 12/17/2015 14:29    Medications:  Prior to Admission:  Prescriptions prior to admission  Medication Sig Dispense Refill Last Dose  . albuterol (PROVENTIL) (2.5 MG/3ML) 0.083% nebulizer solution Take 2.5 mg by nebulization as needed for wheezing or shortness of breath. *may be given every 2 hours as needed for breathing/shortness of breath   Past Week at Unknown time  . Amino Acids-Protein Hydrolys (FEEDING SUPPLEMENT, PRO-STAT 64,) LIQD Take 30 mLs by mouth 2 (two) times daily.   11/21/2015 at Unknown time  . aspirin EC 81 MG tablet Take 81 mg by mouth daily.   11/21/2015 at Unknown time  . clopidogrel (PLAVIX) 75 MG tablet Take 1 tablet (75 mg total) by mouth daily with breakfast. 30 tablet 3 11/21/2015 at Unknown time  . digoxin (LANOXIN) 0.125 MG tablet Take 1 tablet (0.125 mg total) by mouth daily. 30 tablet 3 11/21/2015 at Unknown time  . diltiazem (CARDIZEM) 30 MG tablet Take 1 tablet (30 mg total) by mouth 4 (four)  times daily. 120 tablet 3 11/21/2015 at Unknown time  . fluticasone (FLOVENT HFA) 44 MCG/ACT inhaler Inhale 2 puffs into the lungs 2 (two) times daily.   11/21/2015 at Unknown time  . furosemide (LASIX) 20 MG tablet Take 20 mg by mouth daily.    11/21/2015 at Unknown time  . guaiFENesin (MUCINEX) 600 MG 12 hr tablet Take 1 tablet (600 mg total) by mouth 2 (two) times daily. 14 tablet 0 Past Week at Unknown time  . HYDROcodone-acetaminophen (NORCO) 5-325 MG tablet Take one tablet by mouth every 6 hours as needed for pain. Max APAP 3gm/24hrs from all sources 120 tablet 0 unknown  . insulin aspart (NOVOLOG) 100 UNIT/ML injection Inject into the skin 3 (three) times daily before meals. Sliding scale   11/21/2015 at Unknown time  . ipratropium-albuterol (DUONEB) 0.5-2.5 (3) MG/3ML SOLN Take 3 mLs by nebulization 2 (two) times daily. 360 mL 3 Past Week at Unknown time  . ipratropium-albuterol (DUONEB) 0.5-2.5 (3) MG/3ML SOLN Take 3 mLs by nebulization 3 (three) times daily.   Past Week at Unknown time  . levofloxacin (LEVAQUIN) 500 MG tablet Take 500 mg by mouth daily.   Past Week at Unknown time  . LORazepam (ATIVAN) 1 MG tablet Take 1 tablet (1 mg total) by mouth every 8 (eight) hours. 30 tablet 0 Past Week at Unknown time  . Melatonin 3 MG TABS Take 3 mg by mouth at bedtime.   11/21/2015 at Unknown time  . metFORMIN (GLUCOPHAGE) 500 MG tablet Take 500 mg by mouth 2 (two) times daily after a meal. 500 mg BID after meals   11/21/2015 at Unknown time  . montelukast (SINGULAIR) 10 MG tablet Take 1 tablet (10 mg total) by mouth at bedtime. 30 tablet 3 11/21/2015 at Unknown time  . OXYGEN Inhale 2 L into the lungs daily.   11/21/2015 at Unknown time  . potassium chloride (K-DUR,KLOR-CON) 10 MEQ tablet Take 10 mEq by mouth daily.    11/21/2015 at Unknown time  . pravastatin (PRAVACHOL) 40 MG tablet Take 2 tablets (80 mg total) by mouth every evening. 30 tablet 0 11/21/2015  at Unknown time  . predniSONE (DELTASONE) 50 MG tablet  Take 0.5 tablets (25 mg total) by mouth daily with breakfast. 10 tablet 0 11/21/2015 at Unknown time  . senna (SENNA LAXATIVE) 8.6 MG tablet Take 1 tablet as needed for mild constipation at bedtime   11/21/2015 at Unknown time  . traZODone (DESYREL) 50 MG tablet Take 1 tablet (50 mg total) by mouth at bedtime as needed for sleep.   11/21/2015 at Unknown time  . trypsin-balsam-castor oil (XENADERM) ointment Apply 1 application topically daily. **Apply to coccyx, bilateral buttocls, and saccrum at every shift and as needed**   Past Week at Unknown time   Scheduled: . antiseptic oral rinse  7 mL Mouth Rinse BID  . aspirin EC  81 mg Oral Daily  . budesonide  0.25 mg Inhalation BID  . ceFEPime (MAXIPIME) IV  1 g Intravenous Q12H  . Chlorhexidine Gluconate Cloth  6 each Topical Q0600  . clopidogrel  75 mg Oral Q breakfast  . digoxin  0.125 mg Oral Daily  . diltiazem  30 mg Oral QID  . enoxaparin (LOVENOX) injection  40 mg Subcutaneous Q24H  . guaiFENesin  600 mg Oral BID  . insulin aspart  0-9 Units Subcutaneous Q4H  . ipratropium-albuterol  3 mL Nebulization BID  . LORazepam  1 mg Oral Q8H  . methylPREDNISolone (SOLU-MEDROL) injection  40 mg Intravenous Q12H  . mupirocin ointment  1 application Nasal BID  . pravastatin  80 mg Oral QPM  . senna  1 tablet Oral BID  . sodium chloride flush  3 mL Intravenous Q12H  . vancomycin  750 mg Intravenous Q12H   Continuous:  NFA:[id number] **OR** ondansetron (ZOFRAN) IV  Assesment: She has healthcare associated pneumonia. This is in the setting of COPD and acute on chronic respiratory failure. She's probably aspirated. She is poorly responsive this morning so I'm going to have her get blood gas to make sure there were not dealing with CO2 narcosis Principal Problem:   HCAP (healthcare-associated pneumonia) Active Problems:   COPD (chronic obstructive pulmonary disease) (HCC)   Acute on chronic respiratory failure [facility])   Hospital acquired  PNA    Plan: Check blood gas. She is on appropriate treatment otherwise but I did add IV steroids.    LOS: 1 day   Javaris Wigington L 11/23/2015, 9:04 AM

## 2015-11-23 NOTE — Evaluation (Signed)
Clinical/Bedside Swallow Evaluation Patient Details  Name: Tara Park MRN: [id number] Date of Birth: 05-13-1946  Today's Date: 11/23/2015 Time: SLP Start Time (ACUTE ONLY): 1451 SLP Stop Time (ACUTE ONLY): 1505 SLP Time Calculation (min) (ACUTE ONLY): 14 min  Past Medical History:  Past Medical History  Diagnosis Date  . Diabetes mellitus without complication (HCC)   . Hypertension   . Tobacco abuse     ? quit 1975  . Use of cane as ambulatory aid   . Leg edema, right     chronic  . Stroke (HCC) 1975  . CHF (congestive heart failure) (HCC)   . COPD (chronic obstructive pulmonary disease) (HCC)     on O2  . Anxiety   . Respiratory failure (HCC)   . Hyperlipidemia   . Dysphagia    Past Surgical History:  Past Surgical History  Procedure Laterality Date  . Cataract extraction    . Hip surgery    . Foot surgery     HPI:  Tara GuyJane F Petraglia is an 70 y.o. female with hx of COPD, DM, prior tobacco use, hx of chronic leg edema, distolic CHF, HLD, CVA, recently admitted for multifocal PNA, when she was placed on IV Van/Zosyn for one week and discharged on Levoquin, brought back to the ER today as she was having more SOB and coughs. CT of chest revealed bilateral areas of airspace consolidation in the lungs most likely multifocal pneumonia   Assessment / Plan / Recommendation Clinical Impression  SLP familiar to pt from previous encounters during past acute hospitalizations and during [facility]. Pt with significant decline, not able to arouse for PO this date despite maximal tactile and verbal cues. Nursing reports poor mentation since yesterday afternoon without consumption at meals. Oral cavity noted to be xerostomic, SLP completed oral care. Note worsening multifocal PNA per CT of chest. Given signficantly reduced mentation, decreased respiratory status, and hx of premorbid moderate dysphagia when pt was alert recommend NPO as aspiration risk is severe. Pt would benefit from  palliative consult to further determine family/pt wishes and goals of care. Recommend medicines via alternative needs and ice chips if pt mentation improves. ST to follow up.      Aspiration Risk  Severe aspiration risk;Risk for inadequate nutrition/hydration    Diet Recommendation   NPO, ice chips if pt is alert  Medication Administration: Via alternative means    Other  Recommendations Oral Care Recommendations: Oral care QID (ice chips if pt alert for PO)   Follow up Recommendations  24 hour supervision/assistance    Frequency and Duration min 1 x/week  1 week       Prognosis Prognosis for Safe Diet Advancement: Guarded Barriers to Reach Goals: Severity of deficits;Cognitive deficits      Swallow Study   General Date of Onset: April 04, 2016 HPI: Tara GuyJane F Gewirtz is an 70 y.o. female with hx of COPD, DM, prior tobacco use, hx of chronic leg edema, distolic CHF, HLD, CVA, recently admitted for multifocal PNA, when she was placed on IV Van/Zosyn for one week and discharged on Levoquin, brought back to the ER today as she was having more SOB and coughs. CT of chest revealed bilateral areas of airspace consolidation in the lungs most likely multifocal pneumonia Type of Study: Bedside Swallow Evaluation Previous Swallow Assessment: MBS 06/13/2015 D1/HTL Diet Prior to this Study: Dysphagia 2 (chopped);Honey-thick liquids Temperature Spikes Noted: No Respiratory Status: Nasal cannula History of Recent Intubation: No Behavior/Cognition: Lethargic/Drowsy;Doesn't follow directions Oral  Cavity Assessment: Dry Oral Care Completed by SLP: Yes Oral Cavity - Dentition: Edentulous (dentures at baseline, not in during BSE ) Patient Positioning: Partially reclined Baseline Vocal Quality: Not observed Volitional Cough: Cognitively unable to elicit Volitional Swallow: Unable to elicit    Oral/Motor/Sensory Function     Ice Chips     Thin Liquid Thin Liquid: Not tested    Nectar Thick Nectar  Thick Liquid: Not tested   Honey Thick Honey Thick Liquid: Not tested   Puree Puree: Not tested   Solid   GO   Solid: Not tested       Marcene Duos MA, CCC-SLP Acute Care Speech Language Pathologist    Marcene Duos E 11/23/2015,3:30 PM

## 2015-11-23 NOTE — Progress Notes (Deleted)

## 2015-11-23 NOTE — Progress Notes (Signed)
Long discussion with the one brother and 2 children concerning altering her DO NOT INTUBATE to full code given her dwindling condition there is another sibling driving down from [place] to presentation of family that I believe her condition is terminal and irreversible they'll agree that she should be a no compression and no cardioversion no code I will make these orders today Clint GuyJane F Schabel ZOX:[id number] DOB: 1946-06-29 DOA: 12/18/2015 PCP: [name],Lessly Stigler M, MD   Physical Exam: Blood pressure 85/47, pulse 66, temperature 98 F (36.7 C), temperature source Oral, resp. rate 20, height 5\' 6"  (1.676 m), weight 137 lb (62.143 kg), SpO2 100 %. patient unresponsive except to tactile stimuli lungs show diminished breath sounds to bases prolonged respiratory phase no rales no wheezes appreciable heart regular rhythm no S3 or S4 no heaves thrills rubs   Investigations:  Recent Results (from the past 240 hour(s))  Culture, blood (routine x 2)     Status: None (Preliminary result)   Collection Time: 11/28/2015  2:10 AM  Result Value Ref Range Status   Specimen Description BLOOD RIGHT ARM  Final   Special Requests BOTTLES DRAWN AEROBIC ONLY 2CC  Final   Culture NO GROWTH 1 DAY  Final   Report Status PENDING  Incomplete  Culture, blood (routine x 2)     Status: None (Preliminary result)   Collection Time: 11/30/2015  2:20 AM  Result Value Ref Range Status   Specimen Description BLOOD RIGHT ARM  Final   Special Requests   Final    BOTTLES DRAWN AEROBIC AND ANAEROBIC AEB=10CC ANA=2CC   Culture NO GROWTH 1 DAY  Final   Report Status PENDING  Incomplete  MRSA PCR Screening     Status: Abnormal   Collection Time: 11/23/2015  8:59 AM  Result Value Ref Range Status   MRSA by PCR POSITIVE (A) NEGATIVE Final    Comment:        The GeneXpert MRSA Assay (FDA approved for NASAL specimens only), is one component of a comprehensive MRSA colonization surveillance program. It is not intended to diagnose  MRSA infection nor to guide or monitor treatment for MRSA infections. RESULT CALLED TO, READ BACK BY AND VERIFIED WITH: HAWKINS,M. AT 1222 ON 12/13/2015 BY BAUGHAM,M.      Basic Metabolic Panel:  Recent Labs  81/19/1405/02/2016 0220  NA 140  K 3.6  CL 103  CO2 28  GLUCOSE 111*  BUN 12  CREATININE 0.38*  CALCIUM 7.5*   Liver Function Tests:  Recent Labs  12/08/2015 0220 11/23/15 0615  AST 97* 71*  ALT 74* 58*  ALKPHOS 1015* 735*  BILITOT 1.1 1.2  PROT 5.1* 4.6*  ALBUMIN 1.9* 1.7*     CBC:  Recent Labs  12/11/2015 0220 11/23/15 0615  WBC 22.0* 26.1*  NEUTROABS 20.6* 24.4*  HGB 10.6* 9.2*  HCT 33.0* 28.2*  MCV 75.9* 74.8*  PLT 130* 164    Dg Skull 1-3 Views  11/25/2015  CLINICAL DATA:  Elevated alkaline phosphatase. EXAM: SKULL - 1-3 VIEW COMPARISON:  Head CT dated 06/12/2015. FINDINGS: Stable normal appearing skull.  The patient is edentulous. IMPRESSION: Normal appearing skull. Electronically Signed   By: Beckie SaltsSteven  Reid M.D.   On: 12/11/2015 15:50   Ct Chest W Contrast  12/06/2015  CLINICAL DATA:  Difficulty breathing. X-ray today showed diffuse left sided pneumonia. EXAM: CT CHEST WITH CONTRAST TECHNIQUE: Multidetector CT imaging of the chest was performed during intravenous contrast administration. CONTRAST:  75mL ISOVUE-300 IOPAMIDOL (ISOVUE-300) INJECTION 61% COMPARISON:  Chest  11/28/2015 FINDINGS: Normal heart size. Coronary artery calcifications. Normal caliber thoracic aorta with calcifications. No aortic dissection. Great vessel origins are patent. Central and segmental pulmonary arteries are well opacified without evidence of significant pulmonary embolus. Scattered lymph nodes in the mediastinum are not pathologically enlarged and are likely reactive. Esophagus is decompressed. Diffuse emphysematous changes throughout the lungs. Patchy airspace consolidation demonstrated in the left upper lung, the left lingula, and in both lower lungs as well as focally in the right  upper lung. Changes likely to represent multifocal pneumonia. Bronchiectasis in the lung bases with secretions filling multiple bilateral lower lobe bronchi. This could indicate inspissated mucus due to infection or could indicate aspiration. Small amount of secretions demonstrated in the trachea. No pleural effusions. No pneumothorax. There is a nodule in the right middle lung measuring 9 mm in diameter. Follow-up in 3 months suggested to assess for inflammatory versus possible neoplastic nodule. Included portions of the upper abdominal organs demonstrate surgical absence of the gallbladder and fatty infiltration of the pancreas. Left adrenal gland nodule measuring 2.7 cm diameter. This is indeterminate. Consider further characterization with noncontrast CT or MRI. Diffuse bone demineralization with degenerative changes in the thoracic spine. Compression of several lower thoracic vertebrae. These were present on previous two view chest from 05/31/2015. IMPRESSION: Bilateral areas of airspace consolidation in the lungs most likely multifocal pneumonia. Secretions and bilateral lower lobe bronchi and in the trachea. This may be due to infection or aspiration. Indeterminate 9 mm nodule in the right middle lung. 2.7 cm nodule in the left adrenal gland. Follow-up in 3 months after treatment of acute process is suggested to exclude underlying neoplastic changes. Electronically Signed   By: Burman Nieves M.D.   On: 12/02/2015 06:22   Dg Chest Portable 1 View  12/02/2015  CLINICAL DATA:  Dyspnea, wheezing and hypoxia tonight. EXAM: PORTABLE CHEST 1 VIEW COMPARISON:  11/13/2015 FINDINGS: The left upper lobe airspace opacity has become more confluently consolidated. The right lower lobe airspace opacity is partially cleared since 11/13/2015. Perihilar right lung opacity persists without significant difference compared to the prior study. No large effusions. No other significant interval change. IMPRESSION: Multifocal  airspace opacities with variable interval changes, but worsened in the left upper lobe. This may represent pneumonia. Followup PA and lateral chest X-ray is recommended in 3-4 weeks following trial of antibiotic therapy to ensure resolution and exclude underlying malignancy. Electronically Signed   By: Ellery Plunk M.D.   On: 12/17/2015 01:14   US Abdomen Limited Ruq  12/01/2015  CLINICAL DATA:  Increased alkaline phosphatase. Patient is status post cholecystectomy 30 years ago. EXAM: US ABDOMEN LIMITED - RIGHT UPPER QUADRANT COMPARISON:  None. FINDINGS: Gallbladder: Surgically removed. Common bile duct: Diameter: 9 mm, postsurgical prominence. Liver: No focal lesion identified. Within normal limits in parenchymal echogenicity. IMPRESSION: No acute abnormality. Prior cholecystectomy. Postsurgical dilatation of common bile duct. Electronically Signed   By: Sherian Rein M.D.   On: 12/02/2015 14:29      Medications:   Impression:  Principal Problem:   HCAP (healthcare-associated pneumonia) Active Problems:   COPD (chronic obstructive pulmonary disease) (HCC)   Acute on chronic respiratory failure [facility])   Hospital acquired PNA     Plan: Continue vancomycin and Maxipime patient is on despite she had 2 Honey thickened syrup diet however she is not eating and only responsive to tactile stimuli is now on full no code   Consultants: Pulmonary   Procedures   Antibiotics: Vancomycin and Maxipime  Time spent: 30 minutes   LOS: 1 day   Aldair Rickel M   11/23/2015, 1:48 PM

## 2015-11-23 NOTE — Care Management Important Message (Signed)
Important Message  Patient Details  Name: Tara GuyJane F Park MRN: [id number] Date of Birth: [birth year]   Medicare Important Message Given:  Yes    Adonis HugueninBerkhead, Ardyth Kelso L, RN 11/23/2015, 1:44 PM

## 2015-11-23 NOTE — NC FL2 (Signed)
[place] MEDICAID FL2 LEVEL OF CARE SCREENING TOOL     IDENTIFICATION  Patient Name: Tara Park Birthdate: 04-22-1946 Sex: female Admission Date (Current Location): 12/18/2015  [facility] [place]  [place]  ([id number]) Facility and Address:  [facility],  [address], [place] [id number]      Provider Number: [id number]  Attending Physician Name and Address:  Oval Linseyichard Dondiego, MD  Relative Name and Phone Number:       Current Level of Care: Hospital Recommended Level of Care: Skilled Nursing Facility Prior Approval Number:    Date Approved/Denied:   PASRR Number:  [id number])  Discharge Plan: SNF    Current Diagnoses: Patient Active Problem List   Diagnosis Date Noted  . Hospital acquired PNA 11-Mar-2016  . Sepsis (HCC) 11/13/2015  . Acute on chronic respiratory failure (HCC) 11/13/2015  . Diabetes type 2, uncontrolled (HCC) 11/13/2015  . Anxiety state 11/09/2015  . Mental status change 11/03/2015  . Oral candidiasis 10/27/2015  . Pressure ulcer 10/20/2015  . HCAP (healthcare-associated pneumonia) 10/19/2015  . Paroxysmal a-fib (HCC) 08/28/2015  . Edema 08/27/2015  . UTI (urinary tract infection) 06/13/2015  . Demand ischemia (HCC) 06/11/2015  . Hypokalemia 06/11/2015  . Chronic systolic CHF (congestive heart failure) (HCC) 06/11/2015  . 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 COPD (chronic obstructive pulmonary disease) (HCC) 06/11/2015  . Acute ischemic stroke (HCC) 05/31/2015  . Hypertension   . 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    Orientation RESPIRATION BLADDER Height & Weight     Self  O2 (2L) Incontinent Weight: 137 lb (62.143 kg) Height:  5\' 6"  (167.6 cm)  BEHAVIORAL SYMPTOMS/MOOD NEUROLOGICAL BOWEL NUTRITION STATUS      Incontinent Diet (Diet Carb Modified Fluid consistency honey thick)  AMBULATORY STATUS COMMUNICATION OF NEEDS Skin   Total Care Verbally PU Stage and  Appropriate Care ([place])                       Personal Care Assistance Level of Assistance  Total care       Total Care Assistance: Maximum assistance   Functional Limitations Info  Sight, Hearing, Speech Sight Info: Adequate Hearing Info: Adequate Speech Info: Adequate    SPECIAL CARE FACTORS FREQUENCY                       Contractures      Additional Factors Info  Insulin Sliding Scale Code Status Info:  (Partial Code) Allergies Info:  (No known Allergies) Psychotropic Info:  (Ativan) Insulin Sliding Scale Info:  (Every four hours) Isolation Precautions Info:  (07/11/15 Wound cultures X 2 grew MRSA )     Current Medications (11/23/2015):  This is the current hospital active medication list Current Facility-Administered Medications  Medication Dose Route Frequency Provider Last Rate Last Dose  . antiseptic oral rinse (CPC / CETYLPYRIDINIUM CHLORIDE 0.05%) solution 7 mL  7 mL Mouth Rinse BID Oval Linseyichard Dondiego, MD   7 mL at 25-Sep-2015 2050  . aspirin EC tablet 81 mg  81 mg Oral Daily Houston SirenPeter Le, MD   81 mg at 25-Sep-2015 1137  . budesonide (PULMICORT) nebulizer solution 0.25 mg  0.25 mg Inhalation BID Houston SirenPeter Le, MD   0.25 mg at 11/23/15 [id number]  . ceFEPIme (MAXIPIME) 1 g in dextrose 5 % 50 mL IVPB  1 g Intravenous Q12H Houston SirenPeter Le, MD   1 g at 11/23/15 1100  . Chlorhexidine Gluconate Cloth 2 %  PADS 6 each  6 each Topical Q0600 Oval Linsey, MD   6 each at 11/23/15 0600  . clopidogrel (PLAVIX) tablet 75 mg  75 mg Oral Q breakfast Houston Siren, MD   75 mg at 2015/11/23 1140  . digoxin (LANOXIN) tablet 0.125 mg  0.125 mg Oral Daily Houston Siren, MD   0.125 mg at Nov 23, 2015 1137  . diltiazem (CARDIZEM) tablet 30 mg  30 mg Oral QID Houston Siren, MD   30 mg at Nov 23, 2015 2047  . enoxaparin (LOVENOX) injection 40 mg  40 mg Subcutaneous Q24H Houston Siren, MD   40 mg at 11/23/15 0902  . guaiFENesin (MUCINEX) 12 hr tablet 600 mg  600 mg Oral BID Houston Siren, MD   600 mg at 11-23-2015 2046  . insulin aspart  (novoLOG) injection 0-9 Units  0-9 Units Subcutaneous Q4H Houston Siren, MD   0 Units at Nov 23, 2015 0800  . ipratropium-albuterol (DUONEB) 0.5-2.5 (3) MG/3ML nebulizer solution 3 mL  3 mL Nebulization BID Houston Siren, MD   3 mL at 11/23/15 0818  . LORazepam (ATIVAN) tablet 1 mg  1 mg Oral Q8H Houston Siren, MD   1 mg at 11/23/15 2047  . methylPREDNISolone sodium succinate (SOLU-MEDROL) 40 mg/mL injection 40 mg  40 mg Intravenous Q12H Kari Baars, MD   40 mg at 11/23/15 0945  . mupirocin ointment (BACTROBAN) 2 % 1 application  1 application Nasal BID Oval Linsey, MD   1 application at 23-Nov-2015 2049  . ondansetron (ZOFRAN) tablet 4 mg  4 mg Oral Q6H PRN Houston Siren, MD       Or  . ondansetron [facility]) injection 4 mg  4 mg Intravenous Q6H PRN Houston Siren, MD      . pravastatin (PRAVACHOL) tablet 80 mg  80 mg Oral QPM Houston Siren, MD   80 mg at 11/23/2015 1806  . senna (SENOKOT) tablet 8.6 mg  1 tablet Oral BID Houston Siren, MD   8.6 mg at 23-Nov-2015 2047  . sodium chloride flush (NS) 0.9 % injection 3 mL  3 mL Intravenous Q12H Houston Siren, MD   3 mL at Nov 23, 2015 2050  . vancomycin (VANCOCIN) IVPB 750 mg/150 ml premix  750 mg Intravenous Q12H Oval Linsey, MD   750 mg at 23-Nov-2015 2339     Discharge Medications: Please see discharge summary for a list of discharge medications.  Relevant Imaging Results:  Relevant Lab Results:   Additional Information    Olean Sangster, Juleen China, LCSW

## 2015-11-24 ENCOUNTER — Encounter (HOSPITAL_COMMUNITY): Payer: Self-pay | Admitting: Primary Care

## 2015-11-24 DIAGNOSIS — Z515 Encounter for palliative care: Secondary | ICD-10-CM

## 2015-11-24 DIAGNOSIS — Z7189 Other specified counseling: Secondary | ICD-10-CM | POA: Insufficient documentation

## 2015-11-24 LAB — GLUCOSE, CAPILLARY
GLUCOSE-CAPILLARY: 130 mg/dL — AB (ref 65–99)
GLUCOSE-CAPILLARY: 129 mg/dL — AB (ref 65–99)
GLUCOSE-CAPILLARY: 125 mg/dL — AB (ref 65–99)
GLUCOSE-CAPILLARY: 154 mg/dL — AB (ref 65–99)
Glucose-Capillary: 132 mg/dL — ABNORMAL HIGH (ref 65–99)
Glucose-Capillary: 149 mg/dL — ABNORMAL HIGH (ref 65–99)

## 2015-11-24 LAB — HEPATIC FUNCTION PANEL
ALK PHOS: 588 U/L — AB (ref 38–126)
ALT: 49 U/L (ref 14–54)
AST: 52 U/L — AB (ref 15–41)
Albumin: 1.6 g/dL — ABNORMAL LOW (ref 3.5–5.0)
BILIRUBIN DIRECT: 0.4 mg/dL (ref 0.1–0.5)
BILIRUBIN TOTAL: 1.1 mg/dL (ref 0.3–1.2)
Indirect Bilirubin: 0.7 mg/dL (ref 0.3–0.9)
Total Protein: 4.4 g/dL — ABNORMAL LOW (ref 6.5–8.1)

## 2015-11-24 MED ORDER — SODIUM CHLORIDE 0.9% FLUSH
10.0000 mL | Freq: Two times a day (BID) | INTRAVENOUS | Status: DC
  Administered 2015-11-24 (×2): 10 mL
  Administered 2015-11-25: 20 mL
  Administered 2015-11-26 – 2015-11-28 (×5): 10 mL

## 2015-11-24 MED ORDER — SODIUM CHLORIDE 0.9% FLUSH: 10.0000 mL | INTRAVENOUS | Status: DC | PRN

## 2015-11-24 MED ORDER — METOPROLOL TARTRATE 5 MG/5ML IV SOLN
5.0000 mg | Freq: Four times a day (QID) | INTRAVENOUS | Status: DC
  Administered 2015-11-24 – 2015-11-26 (×7): 5 mg via INTRAVENOUS
  Filled 2015-11-24 (×9): qty 5

## 2015-11-24 NOTE — Clinical Social Work Note (Signed)
CSW provided a status update to N[place]bie at [facility].    Renika Shiflet, Juleen ChinaHeather D, LCSW

## 2015-11-24 NOTE — Progress Notes (Signed)
Appreciate pulmonary no inpatient sterile debilitated condition no significant respiratory distress. Vital level of alertness is diminished over the preceding 3-4 days we have asked for a palliative care consult is a no code with continuation of aggressive medical care discussed this with most family members input in called to [name] today and another family member who lives in [place] 80 her call back KAISYN REINHOLD JWJ:[id number] DOB: 1945-08-12 DOA: 12-20-15 PCP: Isabella Stalling, MD   Physical Exam: Blood pressure 131/79, pulse 86, temperature 98 F (36.7 C), temperature source Oral, resp. rate 20, height  (1.676 m), weight 137 lb (62.143 kg), SpO2 99 %. lungs show prolonged his return x-ray phase scattered rhonchi no rales no wheezes appreciable heart regular rhythm no murmurs, sees feels rubs abdomen soft nontender bowel sounds normoactive patient response to tactile stimuli   Investigations:  Recent Results (from the past 240 hour(s))  Culture, blood (routine x 2)     Status: None (Preliminary result)   Collection Time: 2015-12-20  2:10 AM  Result Value Ref Range Status   Specimen Description BLOOD RIGHT ARM  Final   Special Requests BOTTLES DRAWN AEROBIC ONLY 2CC  Final   Culture NO GROWTH 2 DAYS  Final   Report Status PENDING  Incomplete  Culture, blood (routine x 2)     Status: None (Preliminary result)   Collection Time: 20-Dec-2015  2:20 AM  Result Value Ref Range Status   Specimen Description BLOOD RIGHT ARM  Final   Special Requests   Final    BOTTLES DRAWN AEROBIC AND ANAEROBIC AEB=10CC ANA=2CC   Culture NO GROWTH 2 DAYS  Final   Report Status PENDING  Incomplete  MRSA PCR Screening     Status: Abnormal   Collection Time: Dec 20, 2015  8:59 AM  Result Value Ref Range Status   MRSA by PCR POSITIVE (A) NEGATIVE Final    Comment:        The GeneXpert MRSA Assay (FDA approved for NASAL specimens only), is one component of a comprehensive MRSA  colonization surveillance program. It is not intended to diagnose MRSA infection nor to guide or monitor treatment for MRSA infections. RESULT CALLED TO, READ BACK BY AND VERIFIED WITH: HAWKINS,M. AT 1222 ON 12/20/2015 BY BAUGHAM,M.      Basic Metabolic Panel:  Recent Labs  62/13/08 0220  NA 140  K 3.6  CL 103  CO2 28  GLUCOSE 111*  BUN 12  CREATININE 0.38*  CALCIUM 7.5*   Liver Function Tests:  Recent Labs  11/23/15 0615 11/24/15 0552  AST 71* 52*  ALT 58* 49  ALKPHOS 735* 588*  BILITOT 1.2 1.1  PROT 4.6* 4.4*  ALBUMIN 1.7* 1.6*     CBC:  Recent Labs  12-20-2015 0220 11/23/15 0615  WBC 22.0* 26.1*  NEUTROABS 20.6* 24.4*  HGB 10.6* 9.2*  HCT 33.0* 28.2*  MCV 75.9* 74.8*  PLT 130* 164    Dg Skull 1-3 Views  12-20-2015  CLINICAL DATA:  Elevated alkaline phosphatase. EXAM: SKULL - 1-3 VIEW COMPARISON:  Head CT dated 06/12/2015. FINDINGS: Stable normal appearing skull.  The patient is edentulous. IMPRESSION: Normal appearing skull. Electronically Signed   By: Beckie Salts M.D.   On: Dec 20, 2015 15:50      Medications  Impression:  Principal Problem:   HCAP (healthcare-associated pneumonia) Active Problems:   COPD (chronic obstructive pulmonary disease) (HCC)   Acute on chronic respiratory failure [facility])   Hospital acquired PNA     Plan: Continue triple antibiotics  continue intravenous fluids we will ask for palliative care consult the patient has diminished her mental capacity significantly over the preceding 4 days  Consultants: Pulmonary and palliative care   Procedures   Antibiotics:            Time spent: 30 minutes   LOS: 2 days   Luciel Brickman M   11/24/2015, 2:27 PM

## 2015-11-24 NOTE — Consult Note (Signed)
Consultation Note Date: 11/24/2015   Patient Name: Tara Park  DOB: 1945-11-15  MRN: [id number]  Age / Sex: 70 y.o., female  PCP: Oval Linsey, MD Referring Physician: Oval Linsey, MD  Reason for Consultation: Establishing goals of care, Hospice Evaluation and Psychosocial/spiritual support  HPI/Patient Profile: 70 y.o. 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   Clinical Assessment and Goals of Care: assist Tara Park is sitting up in bed with her brother Tara Park and sister-in-law Tara Park at her bedside. We talk about her health history, and her current illness. Mrs. Hendricks is able to make eye contact with me and tell me who she is and where we are. We call her son Tara Minister to participate in our discussion via phone.  We talk about Mrs. Brayfield's difficulty in breathing, we review her labs, and her medications.  Mrs. Slee asks for a swab so she can wet her mouth.  She is noted to have coughing and wet voice quality afterwards. She states multiple times,  "please please please", )asking for a swab.  She states that she would rather be able to eat and drank even if it means she were to die from aspiration at one point.  We talk about the difference in aggressive treatment and comfort care, and Mrs. Hofstra agrees that should at this point she wants to have aggressive treatment.  I encourage the family to keep Mrs. Duty in the center, in making the decisions that are right for their family.   OTHER, Mrs. Visscher seems to be able to make her own decisions at this time. When I ask who she would like to make health care decisions for her if she is unable, she names her brother Tara Park. Tara Park and  Tara Park share that they believe son Tara Minister is power of attorney. Later in our conversations they reveal that Tara Minister is likely durable power of attorney. We talk about the difference between durable and healthcare power of attorney, and that the majority of the adult children would be decision-makers if there is no healthcare power of attorney.    SUMMARY OF RECOMMENDATIONS   Mrs. Setterlund states several times that she would like to continue with aggressive treatment at this point. We discuss the concepts of comfort care and the concepts of hospice home of [facility] and [place]. Tara Park states that his preference is for Mr. Stuber to be made comfortable, to be allowed to eat and drink as she likes, let nature take its course.  Son Tara Minister desire is to discuss lab work in the morning to see if the antibiotics are working to lower white blood cells, fight infection.  Code Status/Advance Care Planning:  DNR   Symptom Management:   per Dr. Delbert Harness, I discussed with family the benefits of morphine by mouth in reducing the burden of dyspnea during this time.  Palliative Prophylaxis:  Aspiration, Frequent Pain Assessment, Oral Care and Turn Reposition  Additional Recommendations (Limitations, Scope, Preferences):  Treat the treatable at this time, Ms. Ferrera states that she wants to continue with aggressive treatment at this time  Psycho-social/Spiritual:   Desire for further Chaplaincy support:no  Additional Recommendations: Caregiving  Support/Resources and Education on Hospice  Prognosis:   < 4 weeks , likely based on recurrent aspiration, increasing white blood cells despite triple antibiotic therapy, failure to thrive, 6 hospitalizations in 6 months.  Discharge Planning: To Be Determined      Primary Diagnoses: Present on Admission:  . COPD (chronic obstructive pulmonary disease) (HCC) . HCAP (healthcare-associated pneumonia) . Acute on chronic respiratory failure  (HCC) . Hospital acquired PNA  I have reviewed the medical record, interviewed the patient and family, and examined the patient. The following aspects are pertinent.  Past Medical History  Diagnosis Date  . Diabetes mellitus without complication (HCC)   . Hypertension   . Tobacco abuse     ? quit 1975  . Use of cane as ambulatory aid   . Leg edema, right     chronic  . Stroke (HCC) 1975  . CHF (congestive heart failure) (HCC)   . COPD (chronic obstructive pulmonary disease) (HCC)     on O2  . Anxiety   . Respiratory failure (HCC)   . Hyperlipidemia   . Dysphagia    Social History   Social History  . Marital Status: Divorced    Spouse Name: N/A  . Number of Children: N/A  . Years of Education: N/A   Social History Main Topics  . Smoking status: Former Smoker    Types: Cigarettes  . Smokeless tobacco: Never Used     Comment: She stated she quit in 1975 at the time of her stroke; the history is somewhat questionable  . Alcohol Use: No  . Drug Use: No  . Sexual Activity: No   Other Topics Concern  . None   Social History Narrative   Family History  Problem Relation Age of Onset  . Transient ischemic attack Mother   . COPD Father   . Heart disease Brother    Scheduled Meds: . antiseptic oral rinse  7 mL Mouth Rinse BID  . aspirin EC  81 mg Oral Daily  . budesonide  0.25 mg Inhalation BID  . ceFEPime (MAXIPIME) IV  1 g Intravenous Q12H  . Chlorhexidine Gluconate Cloth  6 each Topical Q0600  . clopidogrel  75 mg Oral Q breakfast  . digoxin  0.125 mg Oral Daily  . diltiazem  30 mg Oral QID  . enoxaparin (LOVENOX) injection  40 mg Subcutaneous Q24H  . guaiFENesin  600 mg Oral BID  . insulin aspart  0-9 Units Subcutaneous Q4H  . ipratropium-albuterol  3 mL Nebulization BID  . LORazepam  1 mg Oral Q8H  . methylPREDNISolone (SOLU-MEDROL) injection  40 mg Intravenous Q12H  . metoprolol  5 mg Intravenous Q6H  . mupirocin ointment  1 application Nasal BID  .  pravastatin  80 mg Oral QPM  . senna  1 tablet Oral BID  . sodium chloride flush  10-40 mL Intracatheter Q12H  . sodium chloride flush  3 mL Intravenous Q12H  . vancomycin  750 mg Intravenous Q12H   Continuous Infusions:  PRN Meds:.ondansetron **OR** ondansetron (ZOFRAN) IV, sodium chloride flush Medications Prior to Admission:  Prior to Admission medications   Medication Sig Start Date End Date Taking? Authorizing Provider  albuterol (PROVENTIL) (2.5  MG/3ML) 0.083% nebulizer solution Take 2.5 mg by nebulization as needed for wheezing or shortness of breath. *may be given every 2 hours as needed for breathing/shortness of breath   Yes Historical Provider, MD  Amino Acids-Protein Hydrolys (FEEDING SUPPLEMENT, PRO-STAT 64,) LIQD Take 30 mLs by mouth 2 (two) times daily.   Yes Historical Provider, MD  aspirin EC 81 MG tablet Take 81 mg by mouth daily.   Yes Historical Provider, MD  clopidogrel (PLAVIX) 75 MG tablet Take 1 tablet (75 mg total) by mouth daily with breakfast. 06/10/15  Yes Oval Linsey, MD  digoxin (LANOXIN) 0.125 MG tablet Take 1 tablet (0.125 mg total) by mouth daily. 09/05/15  Yes Oval Linsey, MD  diltiazem (CARDIZEM) 30 MG tablet Take 1 tablet (30 mg total) by mouth 4 (four) times daily. 09/05/15  Yes Oval Linsey, MD  fluticasone (FLOVENT HFA) 44 MCG/ACT inhaler Inhale 2 puffs into the lungs 2 (two) times daily.   Yes Historical Provider, MD  furosemide (LASIX) 20 MG tablet Take 20 mg by mouth daily.    Yes Historical Provider, MD  guaiFENesin (MUCINEX) 600 MG 12 hr tablet Take 1 tablet (600 mg total) by mouth 2 (two) times daily. 06/14/15  Yes Rolly Salter, MD  HYDROcodone-acetaminophen (NORCO) 5-325 MG tablet Take one tablet by mouth every 6 hours as needed for pain. Max APAP 3gm/24hrs from all sources 10/19/15  Yes Kimber Relic, MD  insulin aspart (NOVOLOG) 100 UNIT/ML injection Inject into the skin 3 (three) times daily before meals. Sliding scale   Yes  Historical Provider, MD  ipratropium-albuterol (DUONEB) 0.5-2.5 (3) MG/3ML SOLN Take 3 mLs by nebulization 2 (two) times daily. 09/05/15  Yes Oval Linsey, MD  ipratropium-albuterol (DUONEB) 0.5-2.5 (3) MG/3ML SOLN Take 3 mLs by nebulization 3 (three) times daily.   Yes Historical Provider, MD  levofloxacin (LEVAQUIN) 500 MG tablet Take 500 mg by mouth daily.   Yes Historical Provider, MD  LORazepam (ATIVAN) 1 MG tablet Take 1 tablet (1 mg total) by mouth every 8 (eight) hours. 11/18/15  Yes Oval Linsey, MD  Melatonin 3 MG TABS Take 3 mg by mouth at bedtime.   Yes Historical Provider, MD  metFORMIN (GLUCOPHAGE) 500 MG tablet Take 500 mg by mouth 2 (two) times daily after a meal. 500 mg BID after meals   Yes Historical Provider, MD  montelukast (SINGULAIR) 10 MG tablet Take 1 tablet (10 mg total) by mouth at bedtime. 11/11/15  Yes Pecola Lawless, MD  OXYGEN Inhale 2 L into the lungs daily.   Yes Historical Provider, MD  potassium chloride (K-DUR,KLOR-CON) 10 MEQ tablet Take 10 mEq by mouth daily.    Yes Historical Provider, MD  pravastatin (PRAVACHOL) 40 MG tablet Take 2 tablets (80 mg total) by mouth every evening. 06/14/15  Yes Rolly Salter, MD  predniSONE (DELTASONE) 50 MG tablet Take 0.5 tablets (25 mg total) by mouth daily with breakfast. 11/18/15 11/25/15 Yes Oval Linsey, MD  senna (SENNA LAXATIVE) 8.6 MG tablet Take 1 tablet as needed for mild constipation at bedtime   Yes Historical Provider, MD  traZODone (DESYREL) 50 MG tablet Take 1 tablet (50 mg total) by mouth at bedtime as needed for sleep. 06/14/15  Yes Rolly Salter, MD  trypsin-balsam-castor oil [place]) ointment Apply 1 application topically daily. **Apply to coccyx, bilateral buttocls, and saccrum at every shift and as needed**   Yes Historical Provider, MD   No Known Allergies Review of Systems  Unable to perform ROS:  Acuity of condition    Physical Exam  Constitutional: She is oriented to person, place, and  time. She appears distressed.  Pulmonary/Chest: She is in respiratory distress.  Work of breathing noted, wet voice quality, poor cough effort  Abdominal: Soft. She exhibits no distension. There is no guarding.  Neurological: She is alert and oriented to person, place, and time.  Oriented to person and place  Skin: Skin is warm and dry.  Vitals reviewed.   Vital Signs: BP 131/79 mmHg  Pulse 86  Temp(Src) 98 F (36.7 C) (Oral)  Resp 20  Ht 5\' 6"  (1.676 m)  Wt 62.143 kg (137 lb)  BMI 22.12 kg/m2  SpO2 99% Pain Assessment: PAINAD   Pain Score: Asleep   SpO2: SpO2: 99 % O2 Device:SpO2: 99 % O2 Flow Rate: .O2 Flow Rate (L/min): 2 L/min  IO: Intake/output summary:  Intake/Output Summary (Last 24 hours) at 11/24/15 1633 Last data filed at 11/24/15 1224  Gross per 24 hour  Intake    410 ml  Output      0 ml  Net    410 ml    LBM: Last BM Date: 11/23/15 Baseline Weight: Weight: 62.143 kg (137 lb) Most recent weight: Weight: 62.143 kg (137 lb)     Palliative Assessment/Data:   Flowsheet Rows        Most Recent Value   Intake Tab    Referral Department  -- [PCP]   Unit at Time of Referral  Med/Surg Unit   Palliative Care Primary Diagnosis  Pulmonary   Date Notified  11/24/15   Palliative Care Type  New Palliative care   Reason for referral  Clarify Goals of Care   Date of Admission  2016-06-05   Date first seen by Palliative Care  11/24/15   # of days Palliative referral response time  0 Day(s)   # of days IP prior to Palliative referral  2   Clinical Assessment    Palliative Performance Scale Score  30%   Pain Max last 24 hours  Not able to report   Pain Min Last 24 hours  Not able to report   Dyspnea Max Last 24 Hours  Not able to report   Dyspnea Min Last 24 hours  Not able to report   Psychosocial & Spiritual Assessment    Palliative Care Outcomes    Patient/Family meeting held?  Yes   Who was at the meeting?  Brother Tara AlbertFred, sister-in-law  Tara Hashimotoatricia, son Tara Park  via phone   Palliative Care Outcomes  Clarified goals of care, Provided advance care planning   Patient/Family wishes: Interventions discontinued/not started   Mechanical Ventilation   Palliative Care follow-up planned  -- [Follow up at [facility]]      Time In: 1400 Time Out: 1450 Time Total: 50 minutes GOC discussion shared with nursing, case manager, and Dr. Delbert Harnesson Diego on next rounds. Greater than 50%  of this time was spent counseling and coordinating care related to the above assessment and plan.  Signed by: Katheran Aweove,Harlow Carrizales A, NP   Please contact Palliative Medicine Team phone at [phone] for questions and concerns.  For individual provider: See Loretha StaplerAmion

## 2015-11-24 NOTE — Progress Notes (Signed)
Subjective: She is more alert this morning. She says she is still congested. She is coughing.  Objective: Vital signs in last 24 hours: Temp:  [98 F (36.7 C)-98.2 F (36.8 C)] 98 F (36.7 C) (05/04 0505) Pulse Rate:  [66-77] 76 (05/04 0505) Resp:  [20] 20 (05/04 0505) BP: (85-109)/(47-86) 107/86 mmHg (05/04 0505) SpO2:  [96 %-99 %] 99 % (05/04 0505) Weight change:  Last BM Date: 11/23/15  Intake/Output from previous day:    PHYSICAL EXAM General appearance: alert, cooperative and no distress Resp: rhonchi bilaterally Cardio: regular rate and rhythm, S1, S2 normal, no murmur, click, rub or gallop GI: soft, non-tender; bowel sounds normal; no masses,  no organomegaly Extremities: extremities normal, atraumatic, no cyanosis or edema  Lab Results:  Results for orders placed or performed during the hospital encounter of 12/01/2015 (from the past 48 hour(s))  Glucose, capillary     Status: None   Collection Time: 12/15/2015 12:01 PM  Result Value Ref Range   Glucose-Capillary 82 65 - 99 mg/dL  Glucose, capillary     Status: Abnormal   Collection Time: 12/16/2015  4:50 PM  Result Value Ref Range   Glucose-Capillary 101 (H) 65 - 99 mg/dL   Comment 1 Notify RN   Glucose, capillary     Status: None   Collection Time: 12/05/2015  8:12 PM  Result Value Ref Range   Glucose-Capillary 97 65 - 99 mg/dL   Comment 1 Notify RN    Comment 2 Document in Chart   Hepatic function panel     Status: Abnormal   Collection Time: 11/23/15  6:15 AM  Result Value Ref Range   Total Protein 4.6 (L) 6.5 - 8.1 g/dL   Albumin 1.7 (L) 3.5 - 5.0 g/dL   AST 71 (H) 15 - 41 U/L   ALT 58 (H) 14 - 54 U/L   Alkaline Phosphatase 735 (H) 38 - 126 U/L   Total Bilirubin 1.2 0.3 - 1.2 mg/dL   Bilirubin, Direct 0.5 0.1 - 0.5 mg/dL   Indirect Bilirubin 0.7 0.3 - 0.9 mg/dL  CBC with Differential/Platelet     Status: Abnormal   Collection Time: 11/23/15  6:15 AM  Result Value Ref Range   WBC 26.1 (H) 4.0 - 10.5 K/uL    RBC 3.77 (L) 3.87 - 5.11 MIL/uL   Hemoglobin 9.2 (L) 12.0 - 15.0 g/dL   HCT 45.4 (L) 09.8 - 11.9 %   MCV 74.8 (L) 78.0 - 100.0 fL   MCH 24.4 (L) 26.0 - 34.0 pg   MCHC 32.6 30.0 - 36.0 g/dL   RDW 14.7 (H) 82.9 - 56.2 %   Platelets 164 150 - 400 K/uL   Neutrophils Relative % 94 %   Neutro Abs 24.4 (H) 1.7 - 7.7 K/uL   Lymphocytes Relative 5 %   Lymphs Abs 1.3 0.7 - 4.0 K/uL   Monocytes Relative 2 %   Monocytes Absolute 0.4 0.1 - 1.0 K/uL   Eosinophils Relative 0 %   Eosinophils Absolute 0.0 0.0 - 0.7 K/uL   Basophils Relative 0 %   Basophils Absolute 0.0 0.0 - 0.1 K/uL  Glucose, capillary     Status: Abnormal   Collection Time: 11/23/15  7:28 AM  Result Value Ref Range   Glucose-Capillary 48 (L) 65 - 99 mg/dL   Comment 1 Notify RN    Comment 2 Document in Chart   Glucose, capillary     Status: None   Collection Time: 11/23/15  7:52 AM  Result Value Ref Range   Glucose-Capillary 83 65 - 99 mg/dL  Blood gas, arterial     Status: Abnormal   Collection Time: 11/23/15 10:15 AM  Result Value Ref Range   O2 Content 2.0 L/min   Delivery systems NASAL CANNULA    pH, Arterial 7.506 (H) 7.350 - 7.450   pCO2 arterial 32.7 (L) 35.0 - 45.0 mmHg   pO2, Arterial 75.2 (L) 80.0 - 100.0 mmHg   Bicarbonate 27.0 (H) 20.0 - 24.0 mEq/L   Acid-Base Excess 2.6 (H) 0.0 - 2.0 mmol/L   O2 Saturation 95.1 %   Collection site RIGHT RADIAL    Drawn by [phone]    Sample type ARTERIAL    Allens test (pass/fail) PASS PASS  Glucose, capillary     Status: None   Collection Time: 11/23/15 11:07 AM  Result Value Ref Range   Glucose-Capillary 82 65 - 99 mg/dL   Comment 1 Notify RN    Comment 2 Document in Chart   Glucose, capillary     Status: Abnormal   Collection Time: 11/23/15  5:05 PM  Result Value Ref Range   Glucose-Capillary 142 (H) 65 - 99 mg/dL  Glucose, capillary     Status: Abnormal   Collection Time: 11/23/15  9:03 PM  Result Value Ref Range   Glucose-Capillary 131 (H) 65 - 99 mg/dL    Comment 1 Notify RN    Comment 2 Document in Chart   Glucose, capillary     Status: Abnormal   Collection Time: 11/24/15 12:34 AM  Result Value Ref Range   Glucose-Capillary 132 (H) 65 - 99 mg/dL  Glucose, capillary     Status: Abnormal   Collection Time: 11/24/15  5:04 AM  Result Value Ref Range   Glucose-Capillary 130 (H) 65 - 99 mg/dL   Comment 1 Notify RN    Comment 2 Document in Chart   Hepatic function panel     Status: Abnormal   Collection Time: 11/24/15  5:52 AM  Result Value Ref Range   Total Protein 4.4 (L) 6.5 - 8.1 g/dL   Albumin 1.6 (L) 3.5 - 5.0 g/dL   AST 52 (H) 15 - 41 U/L   ALT 49 14 - 54 U/L   Alkaline Phosphatase 588 (H) 38 - 126 U/L   Total Bilirubin 1.1 0.3 - 1.2 mg/dL   Bilirubin, Direct 0.4 0.1 - 0.5 mg/dL   Indirect Bilirubin 0.7 0.3 - 0.9 mg/dL  Glucose, capillary     Status: Abnormal   Collection Time: 11/24/15  7:37 AM  Result Value Ref Range   Glucose-Capillary 129 (H) 65 - 99 mg/dL    ABGS  Recent Labs  78/46/96 1015  PHART 7.506*  PO2ART 75.2*  HCO3 27.0*   CULTURES Recent Results (from the past 240 hour(s))  Culture, blood (routine x 2)     Status: None (Preliminary result)   Collection Time: Dec 15, 2015  2:10 AM  Result Value Ref Range Status   Specimen Description BLOOD RIGHT ARM  Final   Special Requests BOTTLES DRAWN AEROBIC ONLY 2CC  Final   Culture NO GROWTH 1 DAY  Final   Report Status PENDING  Incomplete  Culture, blood (routine x 2)     Status: None (Preliminary result)   Collection Time: 12-15-15  2:20 AM  Result Value Ref Range Status   Specimen Description BLOOD RIGHT ARM  Final   Special Requests   Final    BOTTLES DRAWN AEROBIC AND ANAEROBIC AEB=10CC ANA=2CC  Culture NO GROWTH 1 DAY  Final   Report Status PENDING  Incomplete  MRSA PCR Screening     Status: Abnormal   Collection Time: 11/30/2015  8:59 AM  Result Value Ref Range Status   MRSA by PCR POSITIVE (A) NEGATIVE Final    Comment:        The GeneXpert MRSA  Assay (FDA approved for NASAL specimens only), is one component of a comprehensive MRSA colonization surveillance program. It is not intended to diagnose MRSA infection nor to guide or monitor treatment for MRSA infections. RESULT CALLED TO, READ BACK BY AND VERIFIED WITH: Abbee Cremeens,M. AT 1222 ON 11/25/2015 BY BAUGHAM,M.    Studies/Results: Dg Skull 1-3 Views  12/07/2015  CLINICAL DATA:  Elevated alkaline phosphatase. EXAM: SKULL - 1-3 VIEW COMPARISON:  Head CT dated 06/12/2015. FINDINGS: Stable normal appearing skull.  The patient is edentulous. IMPRESSION: Normal appearing skull. Electronically Signed   By: Beckie SaltsSteven  Reid M.D.   On: 11/28/2015 15:50   [place] Abdomen Limited Ruq  12/09/2015  CLINICAL DATA:  Increased alkaline phosphatase. Patient is status post cholecystectomy 30 years ago. EXAM: US ABDOMEN LIMITED - RIGHT UPPER QUADRANT COMPARISON:  None. FINDINGS: Gallbladder: Surgically removed. Common bile duct: Diameter: 9 mm, postsurgical prominence. Liver: No focal lesion identified. Within normal limits in parenchymal echogenicity. IMPRESSION: No acute abnormality. Prior cholecystectomy. Postsurgical dilatation of common bile duct. Electronically Signed   By: Sherian ReinWei-Chen  Lin M.D.   On: 11/25/2015 14:29    Medications:  Prior to Admission:  Prescriptions prior to admission  Medication Sig Dispense Refill Last Dose  . albuterol (PROVENTIL) (2.5 MG/3ML) 0.083% nebulizer solution Take 2.5 mg by nebulization as needed for wheezing or shortness of breath. *may be given every 2 hours as needed for breathing/shortness of breath   Past Week at Unknown time  . Amino Acids-Protein Hydrolys (FEEDING SUPPLEMENT, PRO-STAT 64,) LIQD Take 30 mLs by mouth 2 (two) times daily.   11/21/2015 at Unknown time  . aspirin EC 81 MG tablet Take 81 mg by mouth daily.   11/21/2015 at Unknown time  . clopidogrel (PLAVIX) 75 MG tablet Take 1 tablet (75 mg total) by mouth daily with breakfast. 30 tablet 3 11/21/2015 at Unknown  time  . digoxin (LANOXIN) 0.125 MG tablet Take 1 tablet (0.125 mg total) by mouth daily. 30 tablet 3 11/21/2015 at Unknown time  . diltiazem (CARDIZEM) 30 MG tablet Take 1 tablet (30 mg total) by mouth 4 (four) times daily. 120 tablet 3 11/21/2015 at Unknown time  . fluticasone (FLOVENT HFA) 44 MCG/ACT inhaler Inhale 2 puffs into the lungs 2 (two) times daily.   11/21/2015 at Unknown time  . furosemide (LASIX) 20 MG tablet Take 20 mg by mouth daily.    11/21/2015 at Unknown time  . guaiFENesin (MUCINEX) 600 MG 12 hr tablet Take 1 tablet (600 mg total) by mouth 2 (two) times daily. 14 tablet 0 Past Week at Unknown time  . HYDROcodone-acetaminophen (NORCO) 5-325 MG tablet Take one tablet by mouth every 6 hours as needed for pain. Max APAP 3gm/24hrs from all sources 120 tablet 0 unknown  . insulin aspart (NOVOLOG) 100 UNIT/ML injection Inject into the skin 3 (three) times daily before meals. Sliding scale   11/21/2015 at Unknown time  . ipratropium-albuterol (DUONEB) 0.5-2.5 (3) MG/3ML SOLN Take 3 mLs by nebulization 2 (two) times daily. 360 mL 3 Past Week at Unknown time  . ipratropium-albuterol (DUONEB) 0.5-2.5 (3) MG/3ML SOLN Take 3 mLs by nebulization 3 (three) times  daily.   Past Week at Unknown time  . levofloxacin (LEVAQUIN) 500 MG tablet Take 500 mg by mouth daily.   Past Week at Unknown time  . LORazepam (ATIVAN) 1 MG tablet Take 1 tablet (1 mg total) by mouth every 8 (eight) hours. 30 tablet 0 Past Week at Unknown time  . Melatonin 3 MG TABS Take 3 mg by mouth at bedtime.   11/21/2015 at Unknown time  . metFORMIN (GLUCOPHAGE) 500 MG tablet Take 500 mg by mouth 2 (two) times daily after a meal. 500 mg BID after meals   11/21/2015 at Unknown time  . montelukast (SINGULAIR) 10 MG tablet Take 1 tablet (10 mg total) by mouth at bedtime. 30 tablet 3 11/21/2015 at Unknown time  . OXYGEN Inhale 2 L into the lungs daily.   11/21/2015 at Unknown time  . potassium chloride (K-DUR,KLOR-CON) 10 MEQ tablet Take 10 mEq by  mouth daily.    11/21/2015 at Unknown time  . pravastatin (PRAVACHOL) 40 MG tablet Take 2 tablets (80 mg total) by mouth every evening. 30 tablet 0 11/21/2015 at Unknown time  . predniSONE (DELTASONE) 50 MG tablet Take 0.5 tablets (25 mg total) by mouth daily with breakfast. 10 tablet 0 11/21/2015 at Unknown time  . senna (SENNA LAXATIVE) 8.6 MG tablet Take 1 tablet as needed for mild constipation at bedtime   11/21/2015 at Unknown time  . traZODone (DESYREL) 50 MG tablet Take 1 tablet (50 mg total) by mouth at bedtime as needed for sleep.   11/21/2015 at Unknown time  . trypsin-balsam-castor oil (XENADERM) ointment Apply 1 application topically daily. **Apply to coccyx, bilateral buttocls, and saccrum at every shift and as needed**   Past Week at Unknown time   Scheduled: . antiseptic oral rinse  7 mL Mouth Rinse BID  . aspirin EC  81 mg Oral Daily  . budesonide  0.25 mg Inhalation BID  . ceFEPime (MAXIPIME) IV  1 g Intravenous Q12H  . Chlorhexidine Gluconate Cloth  6 each Topical Q0600  . clopidogrel  75 mg Oral Q breakfast  . digoxin  0.125 mg Oral Daily  . diltiazem  30 mg Oral QID  . enoxaparin (LOVENOX) injection  40 mg Subcutaneous Q24H  . guaiFENesin  600 mg Oral BID  . insulin aspart  0-9 Units Subcutaneous Q4H  . ipratropium-albuterol  3 mL Nebulization BID  . LORazepam  1 mg Oral Q8H  . methylPREDNISolone (SOLU-MEDROL) injection  40 mg Intravenous Q12H  . mupirocin ointment  1 application Nasal BID  . pravastatin  80 mg Oral QPM  . senna  1 tablet Oral BID  . sodium chloride flush  3 mL Intravenous Q12H  . vancomycin  750 mg Intravenous Q12H   Continuous:  [id number] **OR** ondansetron (ZOFRAN) IV  Assesment: She was admitted with healthcare associated pneumonia and acute on chronic respiratory failure. I think she is aspirated. She has COPD at baseline. Yesterday she was very poorly responsive but is better today. Principal Problem:   HCAP (healthcare-associated  pneumonia) Active Problems:   COPD (chronic obstructive pulmonary disease) (HCC)   Acute on chronic respiratory failure [facility])   Hospital acquired PNA    Plan: Continue current IV antibiotics. No changes today.    LOS: 2 days   Gavan Nordby L 11/24/2015, 9:05 AM

## 2015-11-24 NOTE — Care Management (Signed)
Discussed case with attending  Dr Janna Archondiego Paliative care consult placed. Made Paliative care NP Tasha aware of order.

## 2015-11-25 LAB — BASIC METABOLIC PANEL
ANION GAP: 8 (ref 5–15)
BUN: 23 mg/dL — ABNORMAL HIGH (ref 6–20)
CHLORIDE: 105 mmol/L (ref 101–111)
CO2: 25 mmol/L (ref 22–32)
Calcium: 7.7 mg/dL — ABNORMAL LOW (ref 8.9–10.3)
Creatinine, Ser: 0.66 mg/dL (ref 0.44–1.00)
GFR calc non Af Amer: >60 mL/min (ref >60)
GLUCOSE: 133 mg/dL — AB (ref 65–99)
Potassium: 3 mmol/L — ABNORMAL LOW (ref 3.5–5.1)
Sodium: 138 mmol/L (ref 135–145)

## 2015-11-25 LAB — CBC
HCT: 29.1 % — ABNORMAL LOW (ref 36.0–46.0)
HEMOGLOBIN: 9.6 g/dL — AB (ref 12.0–15.0)
MCH: 24.6 pg — AB (ref 26.0–34.0)
MCHC: 33 g/dL (ref 30.0–36.0)
MCV: 74.6 fL — AB (ref 78.0–100.0)
Platelets: 165 10*3/uL (ref 150–400)
RBC: 3.9 MIL/uL (ref 3.87–5.11)
RDW: 20.5 % — ABNORMAL HIGH (ref 11.5–15.5)
WBC: 18.2 10*3/uL — ABNORMAL HIGH (ref 4.0–10.5)

## 2015-11-25 LAB — GLUCOSE, CAPILLARY
GLUCOSE-CAPILLARY: 116 mg/dL — AB (ref 65–99)
GLUCOSE-CAPILLARY: 89 mg/dL (ref 65–99)
Glucose-Capillary: 125 mg/dL — ABNORMAL HIGH (ref 65–99)
Glucose-Capillary: 120 mg/dL — ABNORMAL HIGH (ref 65–99)
Glucose-Capillary: 116 mg/dL — ABNORMAL HIGH (ref 65–99)
Glucose-Capillary: 88 mg/dL (ref 65–99)

## 2015-11-25 LAB — HEPATIC FUNCTION PANEL
ALT: 63 U/L — AB (ref 14–54)
AST: 93 U/L — ABNORMAL HIGH (ref 15–41)
Albumin: 1.8 g/dL — ABNORMAL LOW (ref 3.5–5.0)
Alkaline Phosphatase: 795 U/L — ABNORMAL HIGH (ref 38–126)
BILIRUBIN DIRECT: 0.6 mg/dL — AB (ref 0.1–0.5)
BILIRUBIN INDIRECT: 0.8 mg/dL (ref 0.3–0.9)
BILIRUBIN TOTAL: 1.4 mg/dL — AB (ref 0.3–1.2)
Total Protein: 4.8 g/dL — ABNORMAL LOW (ref 6.5–8.1)

## 2015-11-25 LAB — VANCOMYCIN, TROUGH: Vancomycin Tr: 44 ug/mL (ref 10.0–20.0)

## 2015-11-25 MED ORDER — POTASSIUM CHLORIDE 10 MEQ/100ML IV SOLN
10.0000 meq | INTRAVENOUS | Status: DC
Start: 2015-11-25 — End: 2015-11-25

## 2015-11-25 MED ORDER — POTASSIUM CHLORIDE 10 MEQ/100ML IV SOLN
10.0000 meq | INTRAVENOUS | Status: AC
  Administered 2015-11-25 (×4): 10 meq via INTRAVENOUS
  Filled 2015-11-25: qty 100

## 2015-11-25 MED ORDER — VANCOMYCIN HCL IN DEXTROSE 750-5 MG/150ML-% IV SOLN
750.0000 mg | INTRAVENOUS | Status: DC
  Administered 2015-11-26 – 2015-11-27 (×2): 750 mg via INTRAVENOUS
  Filled 2015-11-25 (×3): qty 150

## 2015-11-25 NOTE — Progress Notes (Signed)
CRITICAL VALUE ALERT  Critical value received:  Vancomycin trough 44    Date of notification:  11/25/2015  Time of notification:  12 noon approx.  Critical value read back: 12  Nurse who received alert:  Burt Ekicley Alston/Kenedee Molesky, RN  MD notified (1st page):  Valrie HartScott Hall, RPh  Time of first page:  1230  MD notified (2nd page):  Time of second page:  Responding MD:   Valrie HartScott Hall, RPh  Time MD responded:  1230  New orders to hold vancomycin given and he stated that the Vancomycin would be re-evaluated.

## 2015-11-25 NOTE — Progress Notes (Signed)
Subjective: Palliative care consultation noted and appreciated. She is awake. She seems somewhat confused. She says her breathing is about the same  Objective: Vital signs in last 24 hours: Temp:  [97.9 F (36.6 C)] 97.9 F (36.6 C) (05/05 0416) Pulse Rate:  [69-123] 77 (05/05 0416) Resp:  [20-28] 20 (05/05 0416) BP: (117-145)/(74-104) 117/94 mmHg (05/05 0416) SpO2:  [90 %-91 %] 90 % (05/05 0820) Weight change:  Last BM Date: 11/23/15  Intake/Output from previous day: 05/04 0701 - 05/05 0700 In: 330 [P.O.:120; I.V.:10; IV Piggyback:200] Out: -   PHYSICAL EXAM General appearance: alert and moderate distress Resp: rhonchi bilaterally Cardio: regular rate and rhythm, S1, S2 normal, no murmur, click, rub or gallop GI: soft, non-tender; bowel sounds normal; no masses,  no organomegaly Extremities: extremities normal, atraumatic, no cyanosis or edema  Lab Results:  Results for orders placed or performed during the hospital encounter of 11/28/2015 (from the past 48 hour(s))  Blood gas, arterial     Status: Abnormal   Collection Time: 11/23/15 10:15 AM  Result Value Ref Range   O2 Content 2.0 L/min   Delivery systems NASAL CANNULA    pH, Arterial 7.506 (H) 7.350 - 7.450   pCO2 arterial 32.7 (L) 35.0 - 45.0 mmHg   pO2, Arterial 75.2 (L) 80.0 - 100.0 mmHg   Bicarbonate 27.0 (H) 20.0 - 24.0 mEq/L   Acid-Base Excess 2.6 (H) 0.0 - 2.0 mmol/L   O2 Saturation 95.1 %   Collection site RIGHT RADIAL    Drawn by [phone]    Sample type ARTERIAL    Allens test (pass/fail) PASS PASS  Glucose, capillary     Status: None   Collection Time: 11/23/15 11:07 AM  Result Value Ref Range   Glucose-Capillary 82 65 - 99 mg/dL   Comment 1 Notify RN    Comment 2 Document in Chart   Glucose, capillary     Status: Abnormal   Collection Time: 11/23/15  5:05 PM  Result Value Ref Range   Glucose-Capillary 142 (H) 65 - 99 mg/dL  Glucose, capillary     Status: Abnormal   Collection Time: 11/23/15  9:03 PM   Result Value Ref Range   Glucose-Capillary 131 (H) 65 - 99 mg/dL   Comment 1 Notify RN    Comment 2 Document in Chart   Glucose, capillary     Status: Abnormal   Collection Time: 11/24/15 12:34 AM  Result Value Ref Range   Glucose-Capillary 132 (H) 65 - 99 mg/dL  Glucose, capillary     Status: Abnormal   Collection Time: 11/24/15  5:04 AM  Result Value Ref Range   Glucose-Capillary 130 (H) 65 - 99 mg/dL   Comment 1 Notify RN    Comment 2 Document in Chart   Hepatic function panel     Status: Abnormal   Collection Time: 11/24/15  5:52 AM  Result Value Ref Range   Total Protein 4.4 (L) 6.5 - 8.1 g/dL   Albumin 1.6 (L) 3.5 - 5.0 g/dL   AST 52 (H) 15 - 41 U/L   ALT 49 14 - 54 U/L   Alkaline Phosphatase 588 (H) 38 - 126 U/L   Total Bilirubin 1.1 0.3 - 1.2 mg/dL   Bilirubin, Direct 0.4 0.1 - 0.5 mg/dL   Indirect Bilirubin 0.7 0.3 - 0.9 mg/dL  Glucose, capillary     Status: Abnormal   Collection Time: 11/24/15  7:37 AM  Result Value Ref Range   Glucose-Capillary 129 (H) 65 -  99 mg/dL  Glucose, capillary     Status: Abnormal   Collection Time: 11/24/15 11:23 AM  Result Value Ref Range   Glucose-Capillary 125 (H) 65 - 99 mg/dL  Glucose, capillary     Status: Abnormal   Collection Time: 11/24/15  4:21 PM  Result Value Ref Range   Glucose-Capillary 154 (H) 65 - 99 mg/dL   Comment 1 Notify RN    Comment 2 Document in Chart   Glucose, capillary     Status: Abnormal   Collection Time: 11/24/15  8:32 PM  Result Value Ref Range   Glucose-Capillary 149 (H) 65 - 99 mg/dL   Comment 1 Notify RN    Comment 2 Document in Chart   Glucose, capillary     Status: Abnormal   Collection Time: 11/25/15  4:15 AM  Result Value Ref Range   Glucose-Capillary 125 (H) 65 - 99 mg/dL  Hepatic function panel     Status: Abnormal   Collection Time: 11/25/15  5:35 AM  Result Value Ref Range   Total Protein 4.8 (L) 6.5 - 8.1 g/dL   Albumin 1.8 (L) 3.5 - 5.0 g/dL   AST 93 (H) 15 - 41 U/L   ALT 63 (H)  14 - 54 U/L   Alkaline Phosphatase 795 (H) 38 - 126 U/L   Total Bilirubin 1.4 (H) 0.3 - 1.2 mg/dL   Bilirubin, Direct 0.6 (H) 0.1 - 0.5 mg/dL   Indirect Bilirubin 0.8 0.3 - 0.9 mg/dL  Basic metabolic panel     Status: Abnormal   Collection Time: 11/25/15  5:35 AM  Result Value Ref Range   Sodium 138 135 - 145 mmol/L   Potassium 3.0 (L) 3.5 - 5.1 mmol/L   Chloride 105 101 - 111 mmol/L   CO2 25 22 - 32 mmol/L   Glucose, Bld 133 (H) 65 - 99 mg/dL   BUN 23 (H) 6 - 20 mg/dL   Creatinine, Ser 0.66 0.44 - 1.00 mg/dL   Calcium 7.7 (L) 8.9 - 10.3 mg/dL   GFR calc non Af Amer >60 >60 mL/min   GFR calc Af Amer >60 >60 mL/min    Comment: (NOTE) The eGFR has been calculated using the CKD EPI equation. This calculation has not been validated in all clinical situations. eGFR's persistently <60 mL/min signify possible Chronic Kidney Disease.    Anion gap 8 5 - 15  CBC     Status: Abnormal   Collection Time: 11/25/15  5:35 AM  Result Value Ref Range   WBC 18.2 (H) 4.0 - 10.5 K/uL   RBC 3.90 3.87 - 5.11 MIL/uL   Hemoglobin 9.6 (L) 12.0 - 15.0 g/dL   HCT 29.1 (L) 36.0 - 46.0 %   MCV 74.6 (L) 78.0 - 100.0 fL   MCH 24.6 (L) 26.0 - 34.0 pg   MCHC 33.0 30.0 - 36.0 g/dL   RDW 20.5 (H) 11.5 - 15.5 %   Platelets 165 150 - 400 K/uL  Glucose, capillary     Status: Abnormal   Collection Time: 11/25/15  7:11 AM  Result Value Ref Range   Glucose-Capillary 120 (H) 65 - 99 mg/dL    ABGS  Recent Labs  11/23/15 1015  PHART 7.506*  PO2ART 75.2*  HCO3 27.0*   CULTURES Recent Results (from the past 240 hour(s))  Culture, blood (routine x 2)     Status: None (Preliminary result)   Collection Time: 11/27/2015  2:10 AM  Result Value Ref Range Status   Specimen  Description BLOOD RIGHT ARM  Final   Special Requests BOTTLES DRAWN AEROBIC ONLY 2CC  Final   Culture NO GROWTH 2 DAYS  Final   Report Status PENDING  Incomplete  Culture, blood (routine x 2)     Status: None (Preliminary result)    Collection Time: 12/15/2015  2:20 AM  Result Value Ref Range Status   Specimen Description BLOOD RIGHT ARM  Final   Special Requests   Final    BOTTLES DRAWN AEROBIC AND ANAEROBIC AEB=10CC ANA=2CC   Culture NO GROWTH 2 DAYS  Final   Report Status PENDING  Incomplete  MRSA PCR Screening     Status: Abnormal   Collection Time: 12/06/2015  8:59 AM  Result Value Ref Range Status   MRSA by PCR POSITIVE (A) NEGATIVE Final    Comment:        The GeneXpert MRSA Assay (FDA approved for NASAL specimens only), is one component of a comprehensive MRSA colonization surveillance program. It is not intended to diagnose MRSA infection nor to guide or monitor treatment for MRSA infections. RESULT CALLED TO, READ BACK BY AND VERIFIED WITH: Acacia Latorre,M. AT 1222 ON 12/06/2015 BY BAUGHAM,M.    Studies/Results: No results found.  Medications:  Prior to Admission:  Prescriptions prior to admission  Medication Sig Dispense Refill Last Dose  . albuterol (PROVENTIL) (2.5 MG/3ML) 0.083% nebulizer solution Take 2.5 mg by nebulization as needed for wheezing or shortness of breath. *may be given every 2 hours as needed for breathing/shortness of breath   Past Week at Unknown time  . Amino Acids-Protein Hydrolys (FEEDING SUPPLEMENT, PRO-STAT 64,) LIQD Take 30 mLs by mouth 2 (two) times daily.   11/21/2015 at Unknown time  . aspirin EC 81 MG tablet Take 81 mg by mouth daily.   11/21/2015 at Unknown time  . clopidogrel (PLAVIX) 75 MG tablet Take 1 tablet (75 mg total) by mouth daily with breakfast. 30 tablet 3 11/21/2015 at Unknown time  . digoxin (LANOXIN) 0.125 MG tablet Take 1 tablet (0.125 mg total) by mouth daily. 30 tablet 3 11/21/2015 at Unknown time  . diltiazem (CARDIZEM) 30 MG tablet Take 1 tablet (30 mg total) by mouth 4 (four) times daily. 120 tablet 3 11/21/2015 at Unknown time  . fluticasone (FLOVENT HFA) 44 MCG/ACT inhaler Inhale 2 puffs into the lungs 2 (two) times daily.   11/21/2015 at Unknown time  .  furosemide (LASIX) 20 MG tablet Take 20 mg by mouth daily.    11/21/2015 at Unknown time  . guaiFENesin (MUCINEX) 600 MG 12 hr tablet Take 1 tablet (600 mg total) by mouth 2 (two) times daily. 14 tablet 0 Past Week at Unknown time  . HYDROcodone-acetaminophen (NORCO) 5-325 MG tablet Take one tablet by mouth every 6 hours as needed for pain. Max APAP 3gm/24hrs from all sources 120 tablet 0 unknown  . insulin aspart (NOVOLOG) 100 UNIT/ML injection Inject into the skin 3 (three) times daily before meals. Sliding scale   11/21/2015 at Unknown time  . ipratropium-albuterol (DUONEB) 0.5-2.5 (3) MG/3ML SOLN Take 3 mLs by nebulization 2 (two) times daily. 360 mL 3 Past Week at Unknown time  . ipratropium-albuterol (DUONEB) 0.5-2.5 (3) MG/3ML SOLN Take 3 mLs by nebulization 3 (three) times daily.   Past Week at Unknown time  . levofloxacin (LEVAQUIN) 500 MG tablet Take 500 mg by mouth daily.   Past Week at Unknown time  . LORazepam (ATIVAN) 1 MG tablet Take 1 tablet (1 mg total) by mouth every 8 (  eight) hours. 30 tablet 0 Past Week at Unknown time  . Melatonin 3 MG TABS Take 3 mg by mouth at bedtime.   11/21/2015 at Unknown time  . metFORMIN (GLUCOPHAGE) 500 MG tablet Take 500 mg by mouth 2 (two) times daily after a meal. 500 mg BID after meals   11/21/2015 at Unknown time  . montelukast (SINGULAIR) 10 MG tablet Take 1 tablet (10 mg total) by mouth at bedtime. 30 tablet 3 11/21/2015 at Unknown time  . OXYGEN Inhale 2 L into the lungs daily.   11/21/2015 at Unknown time  . potassium chloride (K-DUR,KLOR-CON) 10 MEQ tablet Take 10 mEq by mouth daily.    11/21/2015 at Unknown time  . pravastatin (PRAVACHOL) 40 MG tablet Take 2 tablets (80 mg total) by mouth every evening. 30 tablet 0 11/21/2015 at Unknown time  . predniSONE (DELTASONE) 50 MG tablet Take 0.5 tablets (25 mg total) by mouth daily with breakfast. 10 tablet 0 11/21/2015 at Unknown time  . senna (SENNA LAXATIVE) 8.6 MG tablet Take 1 tablet as needed for mild  constipation at bedtime   11/21/2015 at Unknown time  . traZODone (DESYREL) 50 MG tablet Take 1 tablet (50 mg total) by mouth at bedtime as needed for sleep.   11/21/2015 at Unknown time  . trypsin-balsam-castor oil (XENADERM) ointment Apply 1 application topically daily. **Apply to coccyx, bilateral buttocls, and saccrum at every shift and as needed**   Past Week at Unknown time   Scheduled: . antiseptic oral rinse  7 mL Mouth Rinse BID  . aspirin EC  81 mg Oral Daily  . budesonide  0.25 mg Inhalation BID  . ceFEPime (MAXIPIME) IV  1 g Intravenous Q12H  . Chlorhexidine Gluconate Cloth  6 each Topical Q0600  . clopidogrel  75 mg Oral Q breakfast  . digoxin  0.125 mg Oral Daily  . diltiazem  30 mg Oral QID  . enoxaparin (LOVENOX) injection  40 mg Subcutaneous Q24H  . guaiFENesin  600 mg Oral BID  . insulin aspart  0-9 Units Subcutaneous Q4H  . ipratropium-albuterol  3 mL Nebulization BID  . LORazepam  1 mg Oral Q8H  . methylPREDNISolone (SOLU-MEDROL) injection  40 mg Intravenous Q12H  . metoprolol  5 mg Intravenous Q6H  . mupirocin ointment  1 application Nasal BID  . pravastatin  80 mg Oral QPM  . senna  1 tablet Oral BID  . sodium chloride flush  10-40 mL Intracatheter Q12H  . sodium chloride flush  3 mL Intravenous Q12H  . vancomycin  750 mg Intravenous Q12H   Continuous:  JQG:[id number] **OR** ondansetron (ZOFRAN) IV, sodium chloride flush  Assesment: She was admitted with healthcare associated pneumonia. She is improving somewhat. This appears to be related to aspiration. She has history of COPD diastolic heart failure cerebrovascular accident and chronic aspiration. She does seem a little better today. Principal Problem:   HCAP (healthcare-associated pneumonia) Active Problems:   COPD (chronic obstructive pulmonary disease) ([place])   Acute on chronic respiratory failure [facility])   Hospital acquired PNA   Palliative care encounter   DNR (do not resuscitate) discussion    Plan:  Continue current treatments    LOS: 3 days   Nathen Balaban L 11/25/2015, 9:26 AM

## 2015-11-25 NOTE — Care Management Important Message (Signed)
Important Message  Patient Details  Name: Tara GuyJane F Park MRN: [id number] Date of Birth: [birth year]   Medicare Important Message Given:  Yes    Adonis HugueninBerkhead, Amaar Oshita L, RN 11/25/2015, 2:40 PM

## 2015-11-25 NOTE — Progress Notes (Signed)
Appreciate palliative care note as well as pulmonary note ABCs at 18,000 she is not in respiratory distress at present somewhat alert and engages in conversation potassium 3.0 replenish intravenously check be met and CBC in a.m. Tara Park WUG:[id number] DOB: Apr 18, 1946 DOA: 11/24/2015 PCP: Maricela Curet, MD   Physical Exam: Blood pressure 117/94, pulse 77, temperature 97.9 F (36.6 C), temperature source Axillary, resp. rate 20, height _0  (1.676 m), weight 137 lb (62.143 kg), SpO2 90 %. lungs show prolonged his return expiratory phase scattered rhonchi no rales no wheezes appreciable heart regular rhythm no S3 or S4 no heaves thrills rubs   Investigations:  Recent Results (from the past 240 hour(s))  Culture, blood (routine x 2)     Status: None (Preliminary result)   Collection Time: 12/13/2015  2:10 AM  Result Value Ref Range Status   Specimen Description BLOOD RIGHT ARM  Final   Special Requests BOTTLES DRAWN AEROBIC ONLY 2CC  Final   Culture NO GROWTH 2 DAYS  Final   Report Status PENDING  Incomplete  Culture, blood (routine x 2)     Status: None (Preliminary result)   Collection Time: 12/01/2015  2:20 AM  Result Value Ref Range Status   Specimen Description BLOOD RIGHT ARM  Final   Special Requests   Final    BOTTLES DRAWN AEROBIC AND ANAEROBIC AEB=10CC ANA=2CC   Culture NO GROWTH 2 DAYS  Final   Report Status PENDING  Incomplete  MRSA PCR Screening     Status: Abnormal   Collection Time: 12/20/2015  8:59 AM  Result Value Ref Range Status   MRSA by PCR POSITIVE (A) NEGATIVE Final    Comment:        The GeneXpert MRSA Assay (FDA approved for NASAL specimens only), is one component of a comprehensive MRSA colonization surveillance program. It is not intended to diagnose MRSA infection nor to guide or monitor treatment for MRSA infections. RESULT CALLED TO, READ BACK BY AND VERIFIED WITH: HAWKINS,M. AT 1222 ON 12/18/2015 BY BAUGHAM,M.      Basic Metabolic  Panel:  Recent Labs  11/25/15 0535  NA 138  K 3.0*  CL 105  CO2 25  GLUCOSE 133*  BUN 23*  CREATININE 0.66  CALCIUM 7.7*   Liver Function Tests:  Recent Labs  11/24/15 0552 11/25/15 0535  AST 52* 93*  ALT 49 63*  ALKPHOS 588* 795*  BILITOT 1.1 1.4*  PROT 4.4* 4.8*  ALBUMIN 1.6* 1.8*     CBC:  Recent Labs  11/23/15 0615 11/25/15 0535  WBC 26.1* 18.2*  NEUTROABS 24.4*  --   HGB 9.2* 9.6*  HCT 28.2* 29.1*  MCV 74.8* 74.6*  PLT 164 165    No results found.    Medications:   Impression:  Principal Problem:   HCAP (healthcare-associated pneumonia) Active Problems:   COPD (chronic obstructive pulmonary disease) (H[facility])   Acute on chronic respiratory failure [facility])   Hospital acquired PNA   Palliative care encounter   DNR (do not resuscitate) discussion     Plan: KCl 10 mEq IV 4 chickpea met and CBC in a.m. continue vancomycin and Maxipime. He shouldn't is a no code DO NOT RESUSCITATE no CPR area to  Consultants: Palliative care pulmonology    Procedures   Antibiotics          Time spent: 30 minutes   LOS: 3 days   Cloris Flippo M   11/25/2015, 12:54 PM

## 2015-11-25 NOTE — Progress Notes (Signed)
Pharmacy Antibiotic Note  Tara Park is a 70 y.o. female admitted on 12/13/2015 with pneumonia.  Pharmacy has been consulted for Vancomycin dosing.  Trough level is elevated.    Plan:  Reduce Vancomycin to 750mg  IV q24hrs  Continue Cefepime 1gm IV q12hrs  Check vanc trough level weekly or sooner if warranted  Monitor labs, renal fxn, progress and c/s  Pharmacokinetic dosing service   Vancomycin single level analysis: Current dose being given: 750 mg Current dosing interval:  12 hrs Current infusion time (hrs): 1  Single level Trough Data:  Trough level obtained: 44 mcg/ml Desired trough: 15 mcg/ml  Estimated PK Parameters: --------------------------- New rate constant (kel): 0.030 hr-1 New half-life: 23.10 Hours New Vd from levels: 43.47  Liters  (0.7 L/kg)  Renal function is stable  Recommendations: ==================== Give Vancomycin  750 mg  IV q 24 hrs. Infuse over 1 hrs Expected Cpeak: 33.1 mcg/ml    Expected Ctrough: 16.6 mcg/ml  Recommended labs and intervals: Measure Bun and Scr 3 times/week.   Thank you for the consult, will continue to follow.  Temp (24hrs), Avg:97.9 F (36.6 C), Min:97.9 F (36.6 C), Max:97.9 F (36.6 C)   Recent Labs Lab 12/29/2015 0220 11/23/15 0615 11/25/15 0535 11/25/15 1103  WBC 22.0* 26.1* 18.2*  --   CREATININE 0.38*  --  0.66  --   VANCOTROUGH  --   --   --  44*    Estimated Creatinine Clearance: 60.4 mL/min (by C-G formula based on Cr of 0.66).    No Known Allergies  Antimicrobials this admission: Vancomycin 5/2 >>  DOSE ADJUSTED ON 5/5 >> Cefepime 5/2 >>   Microbiology results: 5/2 MVH:QIONGEXBCx:pending 5/2 MRSA PCR: pending  Thank you for allowing pharmacy to be a part of this patient's care.  Valrie HartScott Jasmarie Coppock, PharmD Clinical Pharmacist Pager:  [phone] 11/25/2015 12:45 PM   11/25/2015 12:44 PM

## 2015-11-25 NOTE — Evaluation (Signed)
Clinical/Bedside Swallow Evaluation Patient Details  Name: Tara GuyJane F Park MRN: [id number] Date of Birth: March 23, 1946  Today's Date: 11/25/2015 Time: SLP Start Time (ACUTE ONLY): 1445 SLP Stop Time (ACUTE ONLY): 1525 SLP Time Calculation (min) (ACUTE ONLY): 40 min  Past Medical History:  Past Medical History  Diagnosis Date  . Diabetes mellitus without complication (HCC)   . Hypertension   . Tobacco abuse     ? quit 1975  . Use of cane as ambulatory aid   . Leg edema, right     chronic  . Stroke (HCC) 1975  . CHF (congestive heart failure) (HCC)   . COPD (chronic obstructive pulmonary disease) (HCC)     on O2  . Anxiety   . Respiratory failure (HCC)   . Hyperlipidemia   . Dysphagia    Past Surgical History:  Past Surgical History  Procedure Laterality Date  . Cataract extraction    . Hip surgery    . Foot surgery     HPI:  Tara Park is an 70 y.o. female with hx of COPD, DM, prior tobacco use, hx of chronic leg edema, distolic CHF, HLD, CVA, recently admitted for multifocal PNA, when she was placed on IV Van/Zosyn for one week and discharged on Levoquin, brought back to the ER today as she was having more SOB and coughs. CT of chest revealed bilateral areas of airspace consolidation in the lungs most likely multifocal pneumonia   Assessment / Plan / Recommendation Clinical Impression  Pt was evaluated at bedside with family present with continued lethargy although she was pleasant and responsive for a short period when roused. Pt with history of severe dysphagia, comorbidity of COPD, cognitive deficits, and decreased functional reserve.  Pt accepted oral care which was completed by SLP noting dry oral cavity with mild dry secretions cleared by toothette. Pt with possibly slightly improved mentation per initial ST eval unable to rouse patient. Pt did respond with "please" when asked if she wanted anything to drink and was easily roused for 3 1/2 tsp sips of HTL by spoon with  max verbal cues to "swallow hard" with some noted wet vocal quality after the swallow but no reflexive cough was noted and pt could not follow commands to voluntarily cough to clear possible residue. Pt required Max tactile cues. Patient became too lethargic to continue trials after these three small sips therefore PO trials were terminated d/t decreased alertness. Patient's family reports desire for Pt to be able to eat/drink when she wants to despite recent decision to pursue aggressive treatment and previous NPO recommendation. Recommend D1/Puree diet with HONEY THICK LIQUIDS to be administered by tsp sips with FULL SUPERVISION only when patient is ALERT and seated at 90 degrees; NOTE pt's mentation is variable from hour to hour and could affect pt's ability to safely consume PO intake. Diet recommended d/t family and patient request despite education of the risk of aspiration. Pt continues to demonstrate severe aspiration risk, ST will continue to monitor diet tolerance. If pt chooses hospice recommend comfort feeds of textures/consistencies of pt's choice.    Aspiration Risk  Severe aspiration risk;Risk for inadequate nutrition/hydration    Diet Recommendation Dysphagia 1 (Puree);Honey-thick liquid   Liquid Administration via: Spoon Medication Administration: Crushed with puree Supervision: Full supervision/cueing for compensatory strategies;Patient able to self feed Compensations: Minimize environmental distractions;Slow rate;Small sips/bites;Lingual sweep for clearance of pocketing;Monitor for anterior loss;Multiple dry swallows after each bite/sip Postural Changes: Seated upright at 90 degrees  Other  Recommendations Oral Care Recommendations: Oral care BID   Follow up Recommendations  24 hour supervision/assistance    Frequency and Duration min 1 x/week  2 weeks       Prognosis Prognosis for Safe Diet Advancement: Guarded Barriers to Reach Goals: Severity of deficits;Cognitive  deficits      Swallow Study   General HPI: Tara Park is an 70 y.o. female with hx of COPD, DM, prior tobacco use, hx of chronic leg edema, distolic CHF, HLD, CVA, recently admitted for multifocal PNA, when she was placed on IV Van/Zosyn for one week and discharged on Levoquin, brought back to the ER today as she was having more SOB and coughs. CT of chest revealed bilateral areas of airspace consolidation in the lungs most likely multifocal pneumonia Type of Study: Bedside Swallow Evaluation Previous Swallow Assessment: MBS 06[birth year]13[birth year]71 D1/HTL Diet Prior to this Study: Dysphagia 2 (chopped);Honey-thick liquids Temperature Spikes Noted: No Respiratory Status: Nasal cannula History of Recent Intubation: No Behavior/Cognition: Doesn't follow directions;Lethargic/Drowsy Oral Cavity Assessment: Dry Oral Care Completed by SLP: Yes Oral Cavity - Dentition: Edentulous Vision: Functional for self-feeding Self-Feeding Abilities: Needs assist;Total assist Patient Positioning: Upright in bed Baseline Vocal Quality: Normal Volitional Cough: Cognitively unable to elicit Volitional Swallow: Unable to elicit    Oral/Motor/Sensory Function Overall Oral Motor/Sensory Function: Mild impairment Facial ROM: Reduced left Facial Symmetry: Abnormal symmetry left Lingual ROM: Within Functional Limits Lingual Symmetry: Within Functional Limits Lingual Strength: Reduced Mandible: Within Functional Limits   Ice Chips Ice chips: Not tested   Thin Liquid Thin Liquid: Not tested    Nectar Thick Nectar Thick Liquid: Not tested   Honey Thick Honey Thick Liquid: Impaired Presentation: Spoon Oral Phase Impairments: Reduced lingual movement/coordination;Poor awareness of bolus Oral Phase Functional Implications: Prolonged oral transit;Oral residue;Left anterior spillage Pharyngeal Phase Impairments: Suspected delayed Swallow;Throat Clearing - Delayed   Puree Puree: Not tested   Solid   Amelia H. Romie Levee, CCC-SLP Speech Language Pathologist   Solid: Not tested        Georgetta Haber 11/25/2015,3:43 PM

## 2015-11-25 NOTE — Progress Notes (Addendum)
This patient has multiple skin tears on her extremities arms and legs.  The were cleaned and new foam dressings were placed.  Her PICC line dressing was changed and she also has a red rash and white discharge from the vagina.

## 2015-11-26 LAB — GLUCOSE, CAPILLARY
GLUCOSE-CAPILLARY: 80 mg/dL (ref 65–99)
GLUCOSE-CAPILLARY: 87 mg/dL (ref 65–99)
GLUCOSE-CAPILLARY: 91 mg/dL (ref 65–99)
Glucose-Capillary: 92 mg/dL (ref 65–99)
Glucose-Capillary: 71 mg/dL (ref 65–99)
Glucose-Capillary: 113 mg/dL — ABNORMAL HIGH (ref 65–99)

## 2015-11-26 LAB — CBC WITH DIFFERENTIAL/PLATELET
Basophils Absolute: 0 10*3/uL (ref 0.0–0.1)
Basophils Relative: 0 %
EOS ABS: 0 10*3/uL (ref 0.0–0.7)
Eosinophils Relative: 0 %
HCT: 28.5 % — ABNORMAL LOW (ref 36.0–46.0)
HEMOGLOBIN: 9.3 g/dL — AB (ref 12.0–15.0)
LYMPHS ABS: 0.7 10*3/uL (ref 0.7–4.0)
LYMPHS PCT: 3 %
MCH: 24.3 pg — AB (ref 26.0–34.0)
MCHC: 32.6 g/dL (ref 30.0–36.0)
MCV: 74.4 fL — ABNORMAL LOW (ref 78.0–100.0)
Monocytes Absolute: 0.3 10*3/uL (ref 0.1–1.0)
Monocytes Relative: 1 %
NEUTROS PCT: 96 %
Neutro Abs: 21.2 10*3/uL — ABNORMAL HIGH (ref 1.7–7.7)
Platelets: 161 10*3/uL (ref 150–400)
RBC: 3.83 MIL/uL — AB (ref 3.87–5.11)
RDW: 20.7 % — ABNORMAL HIGH (ref 11.5–15.5)
WBC: 22.2 10*3/uL — AB (ref 4.0–10.5)

## 2015-11-26 LAB — BASIC METABOLIC PANEL
ANION GAP: 11 (ref 5–15)
BUN: 31 mg/dL — ABNORMAL HIGH (ref 6–20)
CHLORIDE: 106 mmol/L (ref 101–111)
CO2: 21 mmol/L — ABNORMAL LOW (ref 22–32)
CREATININE: 0.93 mg/dL (ref 0.44–1.00)
Calcium: 7.6 mg/dL — ABNORMAL LOW (ref 8.9–10.3)
GFR calc non Af Amer: >60 mL/min (ref >60)
Glucose, Bld: 113 mg/dL — ABNORMAL HIGH (ref 65–99)
POTASSIUM: 3.8 mmol/L (ref 3.5–5.1)
SODIUM: 138 mmol/L (ref 135–145)

## 2015-11-26 MED ORDER — FLUCONAZOLE 100 MG PO TABS
150.0000 mg | ORAL_TABLET | Freq: Every day | ORAL | Status: DC
  Administered 2015-11-26: 150 mg via ORAL
  Filled 2015-11-26: qty 2

## 2015-11-26 MED ORDER — MORPHINE SULFATE (PF) 2 MG/ML IV SOLN
2.0000 mg | INTRAVENOUS | Status: DC | PRN
  Administered 2015-11-26 – 2015-11-27 (×2): 2 mg via INTRAVENOUS
  Filled 2015-11-26 (×2): qty 1

## 2015-11-26 NOTE — Progress Notes (Signed)
Subjective: She seems a little worse today. She is less alert. No new complaints.  Objective: Vital signs in last 24 hours: Temp:  [97.7 F (36.5 C)-98.3 F (36.8 C)] 97.7 F (36.5 C) (05/06 0700) Pulse Rate:  [52-75] 52 (05/06 0700) Resp:  [18-20] 20 (05/06 0700) BP: (108-128)/(43-84) 112/43 mmHg (05/06 0700) SpO2:  [90 %-95 %] 90 % (05/06 0822) Weight change:  Last BM Date: 11/25/15  Intake/Output from previous day:    PHYSICAL EXAM General appearance: Not as responsive Resp: rhonchi bilaterally Cardio: regular rate and rhythm, S1, S2 normal, no murmur, click, rub or gallop GI: soft, non-tender; bowel sounds normal; no masses,  no organomegaly Extremities: extremities normal, atraumatic, no cyanosis or edema  Lab Results:  Results for orders placed or performed during the hospital encounter of 12/09/2015 (from the past 48 hour(s))  Glucose, capillary     Status: Abnormal   Collection Time: 11/24/15 11:23 AM  Result Value Ref Range   Glucose-Capillary 125 (H) 65 - 99 mg/dL  Glucose, capillary     Status: Abnormal   Collection Time: 11/24/15  4:21 PM  Result Value Ref Range   Glucose-Capillary 154 (H) 65 - 99 mg/dL   Comment 1 Notify RN    Comment 2 Document in Chart   Glucose, capillary     Status: Abnormal   Collection Time: 11/24/15  8:32 PM  Result Value Ref Range   Glucose-Capillary 149 (H) 65 - 99 mg/dL   Comment 1 Notify RN    Comment 2 Document in Chart   Glucose, capillary     Status: Abnormal   Collection Time: 11/25/15 12:07 AM  Result Value Ref Range   Glucose-Capillary 116 (H) 65 - 99 mg/dL   Comment 1 Notify RN    Comment 2 Document in Chart   Glucose, capillary     Status: Abnormal   Collection Time: 11/25/15  4:15 AM  Result Value Ref Range   Glucose-Capillary 125 (H) 65 - 99 mg/dL  Hepatic function panel     Status: Abnormal   Collection Time: 11/25/15  5:35 AM  Result Value Ref Range   Total Protein 4.8 (L) 6.5 - 8.1 g/dL   Albumin 1.8 (L)  3.5 - 5.0 g/dL   AST 93 (H) 15 - 41 U/L   ALT 63 (H) 14 - 54 U/L   Alkaline Phosphatase 795 (H) 38 - 126 U/L   Total Bilirubin 1.4 (H) 0.3 - 1.2 mg/dL   Bilirubin, Direct 0.6 (H) 0.1 - 0.5 mg/dL   Indirect Bilirubin 0.8 0.3 - 0.9 mg/dL  Basic metabolic panel     Status: Abnormal   Collection Time: 11/25/15  5:35 AM  Result Value Ref Range   Sodium 138 135 - 145 mmol/L   Potassium 3.0 (L) 3.5 - 5.1 mmol/L   Chloride 105 101 - 111 mmol/L   CO2 25 22 - 32 mmol/L   Glucose, Bld 133 (H) 65 - 99 mg/dL   BUN 23 (H) 6 - 20 mg/dL   Creatinine, Ser 0.66 0.44 - 1.00 mg/dL   Calcium 7.7 (L) 8.9 - 10.3 mg/dL   GFR calc non Af Amer >60 >60 mL/min   GFR calc Af Amer >60 >60 mL/min    Comment: (NOTE) The eGFR has been calculated using the CKD EPI equation. This calculation has not been validated in all clinical situations. eGFR's persistently <60 mL/min signify possible Chronic Kidney Disease.    Anion gap 8 5 - 15  CBC  Status: Abnormal   Collection Time: 11/25/15  5:35 AM  Result Value Ref Range   WBC 18.2 (H) 4.0 - 10.5 K/uL   RBC 3.90 3.87 - 5.11 MIL/uL   Hemoglobin 9.6 (L) 12.0 - 15.0 g/dL   HCT 29.1 (L) 36.0 - 46.0 %   MCV 74.6 (L) 78.0 - 100.0 fL   MCH 24.6 (L) 26.0 - 34.0 pg   MCHC 33.0 30.0 - 36.0 g/dL   RDW 20.5 (H) 11.5 - 15.5 %   Platelets 165 150 - 400 K/uL  Glucose, capillary     Status: Abnormal   Collection Time: 11/25/15  7:11 AM  Result Value Ref Range   Glucose-Capillary 120 (H) 65 - 99 mg/dL  Vancomycin, trough     Status: Abnormal   Collection Time: 11/25/15 11:03 AM  Result Value Ref Range   Vancomycin Tr 44 (HH) 10.0 - 20.0 ug/mL    Comment: CRITICAL RESULT CALLED TO, READ BACK BY AND VERIFIED WITH: ALSTON,C AT 11:45AM ON 11/25/15 BY FESTERMAN,C   Glucose, capillary     Status: None   Collection Time: 11/25/15 11:20 AM  Result Value Ref Range   Glucose-Capillary 89 65 - 99 mg/dL  Glucose, capillary     Status: Abnormal   Collection Time: 11/25/15  3:52  PM  Result Value Ref Range   Glucose-Capillary 116 (H) 65 - 99 mg/dL  Glucose, capillary     Status: None   Collection Time: 11/25/15 10:53 PM  Result Value Ref Range   Glucose-Capillary 88 65 - 99 mg/dL   Comment 1 Notify RN    Comment 2 Document in Chart   Glucose, capillary     Status: None   Collection Time: 11/26/15 12:40 AM  Result Value Ref Range   Glucose-Capillary 80 65 - 99 mg/dL   Comment 1 Notify RN    Comment 2 Document in Chart   Glucose, capillary     Status: None   Collection Time: 11/26/15  4:43 AM  Result Value Ref Range   Glucose-Capillary 91 65 - 99 mg/dL   Comment 1 Notify RN    Comment 2 Document in Chart   Basic metabolic panel     Status: Abnormal   Collection Time: 11/26/15  7:37 AM  Result Value Ref Range   Sodium 138 135 - 145 mmol/L   Potassium 3.8 3.5 - 5.1 mmol/L    Comment: DELTA CHECK NOTED   Chloride 106 101 - 111 mmol/L   CO2 21 (L) 22 - 32 mmol/L   Glucose, Bld 113 (H) 65 - 99 mg/dL   BUN 31 (H) 6 - 20 mg/dL   Creatinine, Ser 0.93 0.44 - 1.00 mg/dL   Calcium 7.6 (L) 8.9 - 10.3 mg/dL   GFR calc non Af Amer >60 >60 mL/min   GFR calc Af Amer >60 >60 mL/min    Comment: (NOTE) The eGFR has been calculated using the CKD EPI equation. This calculation has not been validated in all clinical situations. eGFR's persistently <60 mL/min signify possible Chronic Kidney Disease.    Anion gap 11 5 - 15  CBC with Differential/Platelet     Status: Abnormal   Collection Time: 11/26/15  7:37 AM  Result Value Ref Range   WBC 22.2 (H) 4.0 - 10.5 K/uL   RBC 3.83 (L) 3.87 - 5.11 MIL/uL   Hemoglobin 9.3 (L) 12.0 - 15.0 g/dL   HCT 28.5 (L) 36.0 - 46.0 %   MCV 74.4 (L) 78.0 - 100.0  fL   MCH 24.3 (L) 26.0 - 34.0 pg   MCHC 32.6 30.0 - 36.0 g/dL   RDW 20.7 (H) 11.5 - 15.5 %   Platelets 161 150 - 400 K/uL   Neutrophils Relative % 96 %   Neutro Abs 21.2 (H) 1.7 - 7.7 K/uL   Lymphocytes Relative 3 %   Lymphs Abs 0.7 0.7 - 4.0 K/uL   Monocytes Relative 1 %    Monocytes Absolute 0.3 0.1 - 1.0 K/uL   Eosinophils Relative 0 %   Eosinophils Absolute 0.0 0.0 - 0.7 K/uL   Basophils Relative 0 %   Basophils Absolute 0.0 0.0 - 0.1 K/uL  Glucose, capillary     Status: None   Collection Time: 11/26/15  7:43 AM  Result Value Ref Range   Glucose-Capillary 92 65 - 99 mg/dL    ABGS No results for input(s): PHART, PO2ART, TCO2, HCO3 in the last 72 hours.  Invalid input(s): PCO2 CULTURES Recent Results (from the past 240 hour(s))  Culture, blood (routine x 2)     Status: None (Preliminary result)   Collection Time: 12/12/2015  2:10 AM  Result Value Ref Range Status   Specimen Description BLOOD RIGHT ARM  Final   Special Requests BOTTLES DRAWN AEROBIC ONLY 2CC  Final   Culture NO GROWTH 4 DAYS  Final   Report Status PENDING  Incomplete  Culture, blood (routine x 2)     Status: None (Preliminary result)   Collection Time: 11/30/2015  2:20 AM  Result Value Ref Range Status   Specimen Description BLOOD RIGHT ARM  Final   Special Requests   Final    BOTTLES DRAWN AEROBIC AND ANAEROBIC AEB=10CC ANA=2CC   Culture NO GROWTH 4 DAYS  Final   Report Status PENDING  Incomplete  MRSA PCR Screening     Status: Abnormal   Collection Time: 12/09/2015  8:59 AM  Result Value Ref Range Status   MRSA by PCR POSITIVE (A) NEGATIVE Final    Comment:        The GeneXpert MRSA Assay (FDA approved for NASAL specimens only), is one component of a comprehensive MRSA colonization surveillance program. It is not intended to diagnose MRSA infection nor to guide or monitor treatment for MRSA infections. RESULT CALLED TO, READ BACK BY AND VERIFIED WITH: Fillmore Bynum,M. AT 1222 ON 12/14/2015 BY BAUGHAM,M.    Studies/Results: No results found.  Medications:  Prior to Admission:  Prescriptions prior to admission  Medication Sig Dispense Refill Last Dose  . albuterol (PROVENTIL) (2.5 MG/3ML) 0.083% nebulizer solution Take 2.5 mg by nebulization as needed for wheezing or  shortness of breath. *may be given every 2 hours as needed for breathing/shortness of breath   Past Week at Unknown time  . Amino Acids-Protein Hydrolys (FEEDING SUPPLEMENT, PRO-STAT 64,) LIQD Take 30 mLs by mouth 2 (two) times daily.   11/21/2015 at Unknown time  . aspirin EC 81 MG tablet Take 81 mg by mouth daily.   11/21/2015 at Unknown time  . clopidogrel (PLAVIX) 75 MG tablet Take 1 tablet (75 mg total) by mouth daily with breakfast. 30 tablet 3 11/21/2015 at Unknown time  . digoxin (LANOXIN) 0.125 MG tablet Take 1 tablet (0.125 mg total) by mouth daily. 30 tablet 3 11/21/2015 at Unknown time  . diltiazem (CARDIZEM) 30 MG tablet Take 1 tablet (30 mg total) by mouth 4 (four) times daily. 120 tablet 3 11/21/2015 at Unknown time  . fluticasone (FLOVENT HFA) 44 MCG/ACT inhaler Inhale 2 puffs into  the lungs 2 (two) times daily.   11/21/2015 at Unknown time  . furosemide (LASIX) 20 MG tablet Take 20 mg by mouth daily.    11/21/2015 at Unknown time  . guaiFENesin (MUCINEX) 600 MG 12 hr tablet Take 1 tablet (600 mg total) by mouth 2 (two) times daily. 14 tablet 0 Past Week at Unknown time  . HYDROcodone-acetaminophen (NORCO) 5-325 MG tablet Take one tablet by mouth every 6 hours as needed for pain. Max APAP 3gm/24hrs from all sources 120 tablet 0 unknown  . insulin aspart (NOVOLOG) 100 UNIT/ML injection Inject into the skin 3 (three) times daily before meals. Sliding scale   11/21/2015 at Unknown time  . ipratropium-albuterol (DUONEB) 0.5-2.5 (3) MG/3ML SOLN Take 3 mLs by nebulization 2 (two) times daily. 360 mL 3 Past Week at Unknown time  . ipratropium-albuterol (DUONEB) 0.5-2.5 (3) MG/3ML SOLN Take 3 mLs by nebulization 3 (three) times daily.   Past Week at Unknown time  . levofloxacin (LEVAQUIN) 500 MG tablet Take 500 mg by mouth daily.   Past Week at Unknown time  . LORazepam (ATIVAN) 1 MG tablet Take 1 tablet (1 mg total) by mouth every 8 (eight) hours. 30 tablet 0 Past Week at Unknown time  . Melatonin 3 MG TABS  Take 3 mg by mouth at bedtime.   11/21/2015 at Unknown time  . metFORMIN (GLUCOPHAGE) 500 MG tablet Take 500 mg by mouth 2 (two) times daily after a meal. 500 mg BID after meals   11/21/2015 at Unknown time  . montelukast (SINGULAIR) 10 MG tablet Take 1 tablet (10 mg total) by mouth at bedtime. 30 tablet 3 11/21/2015 at Unknown time  . OXYGEN Inhale 2 L into the lungs daily.   11/21/2015 at Unknown time  . potassium chloride (K-DUR,KLOR-CON) 10 MEQ tablet Take 10 mEq by mouth daily.    11/21/2015 at Unknown time  . pravastatin (PRAVACHOL) 40 MG tablet Take 2 tablets (80 mg total) by mouth every evening. 30 tablet 0 11/21/2015 at Unknown time  . predniSONE (DELTASONE) 50 MG tablet Take 0.5 tablets (25 mg total) by mouth daily with breakfast. 10 tablet 0 11/21/2015 at Unknown time  . senna (SENNA LAXATIVE) 8.6 MG tablet Take 1 tablet as needed for mild constipation at bedtime   11/21/2015 at Unknown time  . traZODone (DESYREL) 50 MG tablet Take 1 tablet (50 mg total) by mouth at bedtime as needed for sleep.   11/21/2015 at Unknown time  . trypsin-balsam-castor oil (XENADERM) ointment Apply 1 application topically daily. **Apply to coccyx, bilateral buttocls, and saccrum at every shift and as needed**   Past Week at Unknown time   Scheduled: . antiseptic oral rinse  7 mL Mouth Rinse BID  . aspirin EC  81 mg Oral Daily  . budesonide  0.25 mg Inhalation BID  . ceFEPime (MAXIPIME) IV  1 g Intravenous Q12H  . Chlorhexidine Gluconate Cloth  6 each Topical Q0600  . clopidogrel  75 mg Oral Q breakfast  . digoxin  0.125 mg Oral Daily  . diltiazem  30 mg Oral QID  . enoxaparin (LOVENOX) injection  40 mg Subcutaneous Q24H  . guaiFENesin  600 mg Oral BID  . insulin aspart  0-9 Units Subcutaneous Q4H  . ipratropium-albuterol  3 mL Nebulization BID  . LORazepam  1 mg Oral Q8H  . methylPREDNISolone (SOLU-MEDROL) injection  40 mg Intravenous Q12H  . metoprolol  5 mg Intravenous Q6H  . mupirocin ointment  1 application Nasal  BID  .  pravastatin  80 mg Oral QPM  . senna  1 tablet Oral BID  . sodium chloride flush  10-40 mL Intracatheter Q12H  . sodium chloride flush  3 mL Intravenous Q12H  . vancomycin  750 mg Intravenous Q24H   Continuous:  YLU:[id number] **OR** ondansetron (ZOFRAN) IV, sodium chloride flush  Assesment: She was admitted with healthcare associated pneumonia. 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 She has DO NOT RESUSCITATE status now. I think she is getting worse. She is poorly responsive this morning. Principal Problem:   HCAP (healthcare-associated pneumonia) Active Problems:   COPD (chronic obstructive pulmonary disease) ([place])   Acute on chronic respiratory failure [facility])   Hospital acquired PNA   Palliative care encounter   DNR (do not resuscitate) discussion    Plan: Discussed with Dr. Lorriane Shire. We will continue current treatments. I don't think there is really anything to add.    LOS: 4 days   Keiasha Diep L 11/26/2015, 10:52 AM

## 2015-11-26 NOTE — Progress Notes (Signed)
Patient continues on intravenous vancomycin and Maxipime no significant respiratory distress noted. Less alert today than yesterday Clint GuyJane F Takahashi [id number] DOB: 08-20-1945 DOA: 12/14/2015 PCP: [name],Kimber Fritts M, MD   Physical Exam: Blood pressure 112/43, pulse 52, temperature 97.7 F (36.5 C), temperature source Axillary, resp. rate 20, height 5\' 6"  (1.676 m), weight 137 lb (62.143 kg), SpO2 90 %. lungs show scattered rhonchi prolonged his return x-ray phase due to decreased breath sounds in the bases no rales or wheezes appreciable heart regular rhythm no S3-S4 no heaves thrills rubs   Investigations:  Recent Results (from the past 240 hour(s))  Culture, blood (routine x 2)     Status: None (Preliminary result)   Collection Time: Sep 12, 2015  2:10 AM  Result Value Ref Range Status   Specimen Description BLOOD RIGHT ARM  Final   Special Requests BOTTLES DRAWN AEROBIC ONLY 2CC  Final   Culture NO GROWTH 4 DAYS  Final   Report Status PENDING  Incomplete  Culture, blood (routine x 2)     Status: None (Preliminary result)   Collection Time: Sep 12, 2015  2:20 AM  Result Value Ref Range Status   Specimen Description BLOOD RIGHT ARM  Final   Special Requests   Final    BOTTLES DRAWN AEROBIC AND ANAEROBIC AEB=10CC ANA=2CC   Culture NO GROWTH 4 DAYS  Final   Report Status PENDING  Incomplete  MRSA PCR Screening     Status: Abnormal   Collection Time: Sep 12, 2015  8:59 AM  Result Value Ref Range Status   MRSA by PCR POSITIVE (A) NEGATIVE Final    Comment:        The GeneXpert MRSA Assay (FDA approved for NASAL specimens only), is one component of a comprehensive MRSA colonization surveillance program. It is not intended to diagnose MRSA infection nor to guide or monitor treatment for MRSA infections. RESULT CALLED TO, READ BACK BY AND VERIFIED WITH: HAWKINS,M. AT 1222 ON 2015-12-09 BY BAUGHAM,M.      Basic Metabolic Panel:  Recent Labs  57/84/6903/12/07 0535 11/26/15 0737  NA 138 138   K 3.0* 3.8  CL 105 106  CO2 25 21*  GLUCOSE 133* 113*  BUN 23* 31*  CREATININE 0.66 0.93  CALCIUM 7.7* 7.6*   Liver Function Tests:  Recent Labs  11/24/15 0552 11/25/15 0535  AST 52* 93*  ALT 49 63*  ALKPHOS 588* 795*  BILITOT 1.1 1.4*  PROT 4.4* 4.8*  ALBUMIN 1.6* 1.8*     CBC:  Recent Labs  11/25/15 0535 11/26/15 0737  WBC 18.2* 22.2*  NEUTROABS  --  21.2*  HGB 9.6* 9.3*  HCT 29.1* 28.5*  MCV 74.6* 74.4*  PLT 165 161    No results found.    Medications:   Impression:  Principal Problem:   HCAP (healthcare-associated pneumonia) Active Problems:   COPD (chronic obstructive pulmonary disease) ([facility])   Acute on chronic respiratory failure [facility](HCC)   Hospital acquired PNA   Palliative care encounter   DNR (do not resuscitate) discussion     Plan: Prognosis is poor she is a no code or continuing aggressive antibiotic therapy and hopefully resolution of pneumonia patient appears to be spiraling downward overall   Consultants: Pulmonary and palliative care   Procedures   Antibiotics: Vancomycin and Maxipime     Time spent: 30 minutes   LOS: 4 days   Tara Park M   11/26/2015, 10:16 AM

## 2015-11-26 NOTE — Progress Notes (Signed)
Had a long discussion today with the patients son Maurine MinisterDennis about he patients care and plan of care.  He and his wife expresses that after talking with the patient, that they would like the patient to take on the hospice route for the plan of care.  The patient states that she is having pain in her legs.  I notified Dr. Juanetta GoslingHawkins and expressed the above with Dr. Juanetta GoslingHawkins and stated that I would leave a note for the social worker and Lillia Carmelasha Dove, NP to discuss the changes.  The the family and Dr. Juanetta GoslingHawkins verbalized understanding.  New orders were given and followed.

## 2015-11-26 NOTE — Progress Notes (Signed)
Patient very lethargic and sleepy all this shift so far. Unable to give scheduled 2200 PO medications d/t patient not being alert enough. Patient responded when name called by slightly opening her eyes then drifted quickly back to sleep.

## 2015-11-27 LAB — BASIC METABOLIC PANEL
ANION GAP: 10 (ref 5–15)
BUN: 36 mg/dL — AB (ref 6–20)
CALCIUM: 7.6 mg/dL — AB (ref 8.9–10.3)
CHLORIDE: 108 mmol/L (ref 101–111)
CO2: 21 mmol/L — AB (ref 22–32)
CREATININE: 1.06 mg/dL — AB (ref 0.44–1.00)
GFR calc non Af Amer: 52 mL/min — ABNORMAL LOW (ref >60)
GFR, EST AFRICAN AMERICAN: 60 mL/min — AB (ref >60)
Glucose, Bld: 147 mg/dL — ABNORMAL HIGH (ref 65–99)
Potassium: 3.9 mmol/L (ref 3.5–5.1)
SODIUM: 139 mmol/L (ref 135–145)

## 2015-11-27 LAB — GLUCOSE, CAPILLARY
GLUCOSE-CAPILLARY: 49 mg/dL — AB (ref 65–99)
Glucose-Capillary: 128 mg/dL — ABNORMAL HIGH (ref 65–99)
Glucose-Capillary: 142 mg/dL — ABNORMAL HIGH (ref 65–99)
Glucose-Capillary: 145 mg/dL — ABNORMAL HIGH (ref 65–99)

## 2015-11-27 LAB — CULTURE, BLOOD (ROUTINE X 2)
CULTURE: NO GROWTH
Culture: NO GROWTH

## 2015-11-27 MED ORDER — SODIUM CHLORIDE 0.9 % IV SOLN
1.0000 mg/h | INTRAVENOUS | Status: DC
  Administered 2015-11-27: 1 mg/h via INTRAVENOUS
  Filled 2015-11-27: qty 10

## 2015-11-27 MED ORDER — LORAZEPAM 2 MG/ML IJ SOLN: 1.0000 mg | INTRAMUSCULAR | Status: DC | PRN

## 2015-11-27 NOTE — Progress Notes (Signed)
2016 Peri-care, linen change completed and patient repositioned to RIGHT side. Patient tolerated well with some groaning noted. Patient unable to respond when spoken to and eyes noted slightly opened and sluggish to light. All extremities x 4 cool to the touch with slight reddish-purple discoloration noted at this time. Pain appears to be controlled well on Morphine drip 1mg /hr at this time. No family at the bedside at this time.

## 2015-11-27 NOTE — Progress Notes (Signed)
PICC line drsg changed to double lumen PICC line to RIGHT arm this shift. Slight reddish- brown drainage noted to removed drsg and disc. Clean, dry drsg applied via sterile technique. Patient tolerated well.

## 2015-11-27 NOTE — Progress Notes (Signed)
SLP Cancellation Note  Patient Details Name: Tara GuyJane F Park MRN: [id number] DOB: May 21, 1946   Cancelled treatment:       Reason Eval/Treat Not Completed: Patient's level of consciousness; Pt unresponsive at this time, will likely be full comfort measures. SLP will follow per goals of care.  Thank you,  Havery MorosDabney Filmore Molyneux, CCC-SLP [phone]    Anasha Perfecto 11/27/2015, 11:49 AM

## 2015-11-27 NOTE — Progress Notes (Signed)
Discussed with family at bedside. She is unresponsive. Her blood pressure is in the 60s systolic They request full comfort care measures now. I will discontinue antibiotics and other medications and start her on a morphine continuous infusion. I think she may die today.

## 2015-11-27 NOTE — Progress Notes (Signed)
On call MD Dr.Hawkins notified regarding patient's current CBG 49. Per Dr.Hawkins d/c order for accu-checks Q4H, no other orders given to cover hypoglycemia at this time. Patient is on comfort care measures.

## 2015-11-28 NOTE — Progress Notes (Signed)
Daily Progress Note   Patient Name: Tara Park       Date: 11/28/2015 DOB: 07/12/1946  Age: 69 y.o. MRN#: [id number] Attending Physician: Oval Linsey, MD Primary Care Physician: Isabella Stalling, MD Admit Date: 12-19-15  Reason for Consultation/Follow-up: Hospice Evaluation, Psychosocial/spiritual support and Terminal Care  Subjective: Tara Park is lying quietly in bed, her brother and sister-in-law at bedside. She is full comfort care at this time, with the discontinuation of non-comfort measures medications and treatments. Family shares that son Maurine Minister was here all weekend, and is expected to return later today. Brother Merlyn Albert states that he will need to leave soon to return to work. I encourage him that his sister is not responding at this time, that  she knew he was here with her when she was awake. Sister-in-law Elease Hashimoto states that she plans to say at bedside all day. I encouraged her to also take care of herself during this time. Family shares that [name] is breathing about every 8 seconds, and I share that she may have much longer pulses between breaths up to 30 seconds plus, and for them not to be surprised if this happens.  Request a call son Maurine Minister.  He shares that he and his wife are leaving [place] at this time, and will arrive sometime around midday.  He asks if she will be moved to another location, and I share that she is likely to pass where she is. He also asks if she still has days to weeks left, and I share that we feel she has hours to days at this point.    Length of Stay: 6  Current Medications: Scheduled Meds:  . sodium chloride flush  10-40 mL Intracatheter Q12H  . sodium chloride flush  3 mL Intravenous Q12H    Continuous Infusions: .  morphine 1 mg/hr (11/27/15 1319)    PRN Meds: LORazepam, [DISCONTINUED] ondansetron **OR** ondansetron (ZOFRAN) IV, sodium chloride flush  Physical Exam  Constitutional: No distress.  Pulmonary/Chest:  RR of approx 8 times per min. Non labored.   Abdominal: Soft. She exhibits no distension.  Neurological:  Does not respond to verbal   Skin: Skin is warm and dry.  Nursing note and vitals reviewed.           Vital Signs: BP 110/69 mmHg  Pulse 78  Temp(Src) 97.5 F (36.4 C) (Axillary)  Resp 22  Ht  (1.676 m)  Wt 62.143 kg (137 lb)  BMI 22.12 kg/m2  SpO2 74% SpO2: SpO2: (!) 74 % O2 Device: O2 Device: High Flow Nasal Cannula O2 Flow Rate: O2 Flow Rate (L/min): 8 L/min  Intake/output summary: No intake or output data in the 24 hours ending 11/28/15 1147 LBM: Last BM Date: 11/25/15 Baseline Weight: Weight: 62.143 kg (137 lb) Most recent weight: Weight: 62.143 kg (137 lb)       Palliative Assessment/Data:    Flowsheet Rows        Most Recent Value   Intake Tab    Referral Department  -- [PCP]   Unit at Time of Referral  Med/Surg Unit   Palliative Care Primary Diagnosis  Pulmonary   Date Notified  11/24/15   Palliative Care Type  New Palliative care   Reason for referral  Clarify Goals of Care   Date of Admission  Dec 19, 2015   Date first seen by Palliative Care  11/24/15   # of days Palliative referral response time  0 Day(s)   # of days IP prior to Palliative referral  2   Clinical Assessment    Palliative Performance Scale Score  30%   Pain Max last 24 hours  Not able to report   Pain Min Last 24 hours  Not able to report   Dyspnea Max Last 24 Hours  Not able to report   Dyspnea Min Last 24 hours  Not able to report   Psychosocial & Spiritual Assessment    Palliative Care Outcomes    Patient/Family meeting held?  Yes   Who was at the meeting?  Brother Merlyn Albert, sister-in-law  Elease Hashimoto, son Maurine Minister via phone   Palliative Care Outcomes  Clarified goals of care,  Provided advance care planning   Patient/Family wishes: Interventions discontinued/not started   Mechanical Ventilation   Palliative Care follow-up planned  -- [Follow up at [facility]]      Patient Active Problem List   Diagnosis Date Noted  . Palliative care encounter   . DNR (do not resuscitate) discussion   . Hospital acquired PNA 19-Dec-2015  . Sepsis (HCC) 11/13/2015  . Acute on chronic respiratory failure (HCC) 11/13/2015  . Diabetes type 2, uncontrolled (HCC) 11/13/2015  . Anxiety state 11/09/2015  . Mental status change 11/03/2015  . Oral candidiasis 10/27/2015  . Pressure ulcer 10/20/2015  . HCAP (healthcare-associated pneumonia) 10/19/2015  . Paroxysmal a-fib (HCC) 08/28/2015  . Edema 08/27/2015  . UTI (urinary tract infection) 06/13/2015  . Demand ischemia (HCC) 06/11/2015  . Hypokalemia 06/11/2015  . Chronic systolic CHF (congestive heart failure) (HCC) 06/11/2015  . 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 COPD (chronic obstructive pulmonary disease) (HCC) 06/11/2015  . Acute ischemic stroke (HCC) 05/31/2015  . Hypertension   . 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    Palliative Care Assessment & Plan   Patient Profile: Tara Park is a 70 year old female with a history of COPD, DM, CHF, CVA with hemiplegia, who was recently admitted for multifocal pneumonia.  Assessment: as above  Recommendations/Plan:  full comfort care at this time.  Goals of Care and Additional Recommendations:  Limitations on Scope of Treatment: Full Comfort Care  Code Status:    Code Status Orders        Start     Ordered   11/23/15 1352  Do not attempt resuscitation (DNR)   Continuous  Question Answer Comment  In the event of cardiac or respiratory ARREST Do not call a "code blue"   In the event of cardiac or respiratory ARREST Do not perform Intubation, CPR, defibrillation or ACLS   In the event of cardiac or respiratory ARREST Use  medication by any route, position, wound care, and other measures to relive pain and suffering. May use oxygen, suction and manual treatment of airway obstruction as needed for comfort.      11/23/15 1352    Code Status History    Date Active Date Inactive Code Status Order ID Comments User Context   12/20/2015  5:54 AM 11/23/2015  1:52 PM Partial Code [id number]  [place] SirenPeter Le, MD ED   11/13/2015  4:03 PM 11/18/2015  8:25 PM Partial Code [id number]  Erick BlinksJehanzeb Memon, MD Inpatient   10/20/2015  4:05 AM 10/27/2015  5:02 PM Full Code [id number]  Meredeth IdeGagan S Lama, MD ED   08/28/2015 10:28 PM 09/05/2015  9:09 PM Full Code [id number]  Houston SirenPeter Le, MD Inpatient   06/11/2015  4:37 PM 06/14/2015  5:56 PM Full Code [id number]  Henderson CloudEstela Y Hernandez Acosta, MD Inpatient   05/31/2015 10:23 PM 06/10/2015  4:36 PM Full Code [id number]  Lorretta HarpXilin Niu, MD ED    Advance Directive Documentation        Most Recent Value   Type of Advance Directive  Living will   Pre-existing out of facility DNR order (yellow form or pink MOST form)     "MOST" Form in Place?         Prognosis:   Hours - Days  Discharge Planning:  Anticipated Hospital Death  Care plan was discussed with nursing staff, CM, SW, and Dr. Juanetta GoslingHawkins on next rounds.   Thank you for allowing the Palliative Medicine Team to assist in the care of this patient.   Time In: 0940 Time Out: 1015 Total Time 35 minutes Prolonged Time Billed  no       Greater than 50%  of this time was spent counseling and coordinating care related to the above assessment and plan.  Fani Rotondo A, NP  Please contact Palliative Medicine Team phone at [phone] for questions and concerns.

## 2015-11-28 NOTE — Progress Notes (Signed)
Patient unresponsive to tactile stimuli. Shallow respirations noted diminished sensorium Tara Park ZOX:[id number] DOB: 24-Mar-1946 DOA: 12/20/2015 PCP: [name],[name], MD   Physical Exam: Blood pressure 110/69, pulse 78, temperature 97.5 F (36.4 C), temperature source Axillary, resp. rate 22, height 5\' 6"  (1.676 Park), weight 137 lb (62.143 kg), SpO2 74 %. lungs show diminished breath sounds to bases no rales wheeze rhonchi appreciable heart regular rhythm no S3 or S4 no heaves feels rubs abdomen soft nontender bowel sounds normoactive  Investigations:  Recent Results (from the past 240 hour(s))  Culture, blood (routine x 2)     Status: None   Collection Time: 12/18/2015  2:10 AM  Result Value Ref Range Status   Specimen Description BLOOD RIGHT ARM  Final   Special Requests BOTTLES DRAWN AEROBIC ONLY 2CC  Final   Culture NO GROWTH 5 DAYS  Final   Report Status 11/27/2015 FINAL  Final  Culture, blood (routine x 2)     Status: None   Collection Time: 12/05/2015  2:20 AM  Result Value Ref Range Status   Specimen Description BLOOD RIGHT ARM  Final   Special Requests   Final    BOTTLES DRAWN AEROBIC AND ANAEROBIC AEB=10CC ANA=2CC   Culture NO GROWTH 5 DAYS  Final   Report Status 11/27/2015 FINAL  Final  MRSA PCR Screening     Status: Abnormal   Collection Time: 12/12/2015  8:59 AM  Result Value Ref Range Status   MRSA by PCR POSITIVE (A) NEGATIVE Final    Comment:        The GeneXpert MRSA Assay (FDA approved for NASAL specimens only), is one component of a comprehensive MRSA colonization surveillance program. It is not intended to diagnose MRSA infection nor to guide or monitor treatment for MRSA infections. RESULT CALLED TO, READ BACK BY AND VERIFIED WITH: HAWKINS,Park. AT 1222 ON 11/28/2015 BY BAUGHAM,Park.      Basic Metabolic Panel:  Recent Labs  81/19/1404/01/07 0737 11/27/15 0654  NA 138 139  K 3.8 3.9  CL 106 108  CO2 21* 21*  GLUCOSE 113* 147*  BUN 31* 36*  CREATININE  0.93 1.06*  CALCIUM 7.6* 7.6*   Liver Function Tests: No results for input(s): AST, ALT, ALKPHOS, BILITOT, PROT, ALBUMIN in the last 72 hours.   CBC:  Recent Labs  11/26/15 0737  WBC 22.2*  NEUTROABS 21.2*  HGB 9.3*  HCT 28.5*  MCV 74.4*  PLT 161    No results found.    Medications:   Impression:  Principal Problem:   HCAP (healthcare-associated pneumonia) Active Problems:   COPD (chronic obstructive pulmonary disease) (HCC)   Acute on chronic respiratory failure [facility](HCC)   Hospital acquired PNA   Palliative care encounter   DNR (do not resuscitate) discussion     Plan: Palliative care only comfort care measures and Zithromax discontinued deafness expectant  Consultants: Pulmonary now signed off. Palliative care   Procedures   Antibiotics:   Time spent: 30 minutes   LOS: 6 days   Tara Park   11/28/2015, 11:55 AM

## 2015-11-28 NOTE — Progress Notes (Signed)
She is on comfort care now. She looks like she will probably die today. I will plan to sign off. Thank you for allowing me to participate in her care.

## 2015-11-28 NOTE — Progress Notes (Signed)
Nutrition Brief Note  Chart reviewed. Pt now transitioning to comfort care.  No further nutrition interventions warranted at this time.   Lynn Candiss Galeana MS,RD,CSG,LDN Office: #[phone] Pager: #[phone]      

## 2015-11-28 NOTE — Progress Notes (Signed)
SLP Cancellation Note  Patient Details Name: Tara GuyJane F Schnelle MRN: [id number] DOB: 1946/04/30   Cancelled treatment:       Reason Eval/Treat Not Completed: Patient's level of consciousness; Pt is now full comfort care. SLP will sign off. Reconsult should the need arise.  Thank you,  Havery MorosDabney Porter, CCC-SLP [phone]    PORTER,DABNEY 11/28/2015, 3:18 PM

## 2015-12-22 NOTE — Discharge Summary (Signed)
Physician Discharge Summary  Clint GuyJane F Pursell [id number] DOB: February 11, 1946 DOA: 11/26/2015  PCP: [name],Leeyah Heather M, MD  Admit date: 12/20/2015 Discharge date: 12/21/2015   Recommendations for Outpatient Follow-up:  This is a death summary after the fact patient was admitted multiple times with multi lobar pneumonia from skilled nursing facility had decreasing level of consciousness of her several weeks essentially admitted with the dwindles and she was given several weeks of multiple antibiotics including Maxipime vancomycin and Levaquin after returning to skilled nursing facility surgery admitted again with increasing multi lobar infiltrates is suspicious for aspiration pneumonia recurrent Friday the patient had lost most of her consciousness and ability to communicate after long discussion with the family was felt best to put her on palliative care and DO NOT RESUSCITATE this was done and the patient expired within 36 hours current to the patient's and family's wishes Discharge Diagnoses:  Principal Problem:   HCAP (healthcare-associated pneumonia) Active Problems:   COPD (chronic obstructive pulmonary disease) (HCC)   Acute on chronic respiratory failure [facility](HCC)   Hospital acquired PNA   Palliative care encounter   DNR (do not resuscitate) discussion   Discharge Condition: Expired  Filed Weights   11/30/2015 0803 12/03/2015 1629  Weight: 62.143 kg (137 lb) 62.143 kg (137 lb)    History of present illness:  See recommendations for follow-up above  Hospital Course:  See recommendations for follow-up above  Procedures:     Consultations:    Discharge Instructions     Medication List    ASK your doctor about these medications        albuterol (2.5 MG/3ML) 0.083% nebulizer solution  Commonly known as:  PROVENTIL  Take 2.5 mg by nebulization as needed for wheezing or shortness of breath. *may be given every 2 hours as needed for breathing/shortness of breath     aspirin  EC 81 MG tablet  Take 81 mg by mouth daily.     clopidogrel 75 MG tablet  Commonly known as:  PLAVIX  Take 1 tablet (75 mg total) by mouth daily with breakfast.     digoxin 0.125 MG tablet  Commonly known as:  LANOXIN  Take 1 tablet (0.125 mg total) by mouth daily.     diltiazem 30 MG tablet  Commonly known as:  CARDIZEM  Take 1 tablet (30 mg total) by mouth 4 (four) times daily.     feeding supplement (PRO-STAT 64) Liqd  Take 30 mLs by mouth 2 (two) times daily.     fluticasone 44 MCG/ACT inhaler  Commonly known as:  FLOVENT HFA  Inhale 2 puffs into the lungs 2 (two) times daily.     furosemide 20 MG tablet  Commonly known as:  LASIX  Take 20 mg by mouth daily.     guaiFENesin 600 MG 12 hr tablet  Commonly known as:  MUCINEX  Take 1 tablet (600 mg total) by mouth 2 (two) times daily.     HYDROcodone-acetaminophen 5-325 MG tablet  Commonly known as:  NORCO  Take one tablet by mouth every 6 hours as needed for pain. Max APAP 3gm/24hrs from all sources     insulin aspart 100 UNIT/ML injection  Commonly known as:  novoLOG  Inject into the skin 3 (three) times daily before meals. Sliding scale     ipratropium-albuterol 0.5-2.5 (3) MG/3ML Soln  Commonly known as:  DUONEB  Take 3 mLs by nebulization 3 (three) times daily.     ipratropium-albuterol 0.5-2.5 (3) MG/3ML Soln  Commonly  known as:  DUONEB  Take 3 mLs by nebulization 2 (two) times daily.     levofloxacin 500 MG tablet  Commonly known as:  LEVAQUIN  Take 500 mg by mouth daily.     LORazepam 1 MG tablet  Commonly known as:  ATIVAN  Take 1 tablet (1 mg total) by mouth every 8 (eight) hours.     Melatonin 3 MG Tabs  Take 3 mg by mouth at bedtime.     metFORMIN 500 MG tablet  Commonly known as:  GLUCOPHAGE  Take 500 mg by mouth 2 (two) times daily after a meal. 500 mg BID after meals     montelukast 10 MG tablet  Commonly known as:  SINGULAIR  Take 1 tablet (10 mg total) by mouth at bedtime.     OXYGEN    Inhale 2 L into the lungs daily.     potassium chloride 10 MEQ tablet  Commonly known as:  K-DUR,KLOR-CON  Take 10 mEq by mouth daily.     pravastatin 40 MG tablet  Commonly known as:  PRAVACHOL  Take 2 tablets (80 mg total) by mouth every evening.     predniSONE 50 MG tablet  Commonly known as:  DELTASONE  Take 0.5 tablets (25 mg total) by mouth daily with breakfast.     SENNA LAXATIVE 8.6 MG tablet  Generic drug:  senna  Take 1 tablet as needed for mild constipation at bedtime     traZODone 50 MG tablet  Commonly known as:  DESYREL  Take 1 tablet (50 mg total) by mouth at bedtime as needed for sleep.     trypsin-balsam-castor oil ointment  Commonly known as:  XENADERM  Apply 1 application topically daily. **Apply to coccyx, bilateral buttocls, and saccrum at every shift and as needed**       No Known Allergies    The results of significant diagnostics from this hospitalization (including imaging, microbiology, ancillary and laboratory) are listed below for reference.    Significant Diagnostic Studies: Dg Skull 1-3 Views  11/23/2015  CLINICAL DATA:  Elevated alkaline phosphatase. EXAM: SKULL - 1-3 VIEW COMPARISON:  Head CT dated 06/12/2015. FINDINGS: Stable normal appearing skull.  The patient is edentulous. IMPRESSION: Normal appearing skull. Electronically Signed   By: Beckie Salts M.D.   On: 12/15/2015 15:50   Ct Chest W Contrast  11/28/2015  CLINICAL DATA:  Difficulty breathing. X-ray today showed diffuse left sided pneumonia. EXAM: CT CHEST WITH CONTRAST TECHNIQUE: Multidetector CT imaging of the chest was performed during intravenous contrast administration. CONTRAST:  75mL ISOVUE-300 IOPAMIDOL (ISOVUE-300) INJECTION 61% COMPARISON:  Chest 12/10/2015 FINDINGS: Normal heart size. Coronary artery calcifications. Normal caliber thoracic aorta with calcifications. No aortic dissection. Great vessel origins are patent. Central and segmental pulmonary arteries are well  opacified without evidence of significant pulmonary embolus. Scattered lymph nodes in the mediastinum are not pathologically enlarged and are likely reactive. Esophagus is decompressed. Diffuse emphysematous changes throughout the lungs. Patchy airspace consolidation demonstrated in the left upper lung, the left lingula, and in both lower lungs as well as focally in the right upper lung. Changes likely to represent multifocal pneumonia. Bronchiectasis in the lung bases with secretions filling multiple bilateral lower lobe bronchi. This could indicate inspissated mucus due to infection or could indicate aspiration. Small amount of secretions demonstrated in the trachea. No pleural effusions. No pneumothorax. There is a nodule in the right middle lung measuring 9 mm in diameter. Follow-up in 3 months suggested to assess for  inflammatory versus possible neoplastic nodule. Included portions of the upper abdominal organs demonstrate surgical absence of the gallbladder and fatty infiltration of the pancreas. Left adrenal gland nodule measuring 2.7 cm diameter. This is indeterminate. Consider further characterization with noncontrast CT or MRI. Diffuse bone demineralization with degenerative changes in the thoracic spine. Compression of several lower thoracic vertebrae. These were present on previous two view chest from 05/31/2015. IMPRESSION: Bilateral areas of airspace consolidation in the lungs most likely multifocal pneumonia. Secretions and bilateral lower lobe bronchi and in the trachea. This may be due to infection or aspiration. Indeterminate 9 mm nodule in the right middle lung. 2.7 cm nodule in the left adrenal gland. Follow-up in 3 months after treatment of acute process is suggested to exclude underlying neoplastic changes. Electronically Signed   By: Burman Nieves M.D.   On: 12-12-15 06:22   Dg Chest Portable 1 View  12/12/15  CLINICAL DATA:  Dyspnea, wheezing and hypoxia tonight. EXAM: PORTABLE CHEST  1 VIEW COMPARISON:  11/13/2015 FINDINGS: The left upper lobe airspace opacity has become more confluently consolidated. The right lower lobe airspace opacity is partially cleared since 11/13/2015. Perihilar right lung opacity persists without significant difference compared to the prior study. No large effusions. No other significant interval change. IMPRESSION: Multifocal airspace opacities with variable interval changes, but worsened in the left upper lobe. This may represent pneumonia. Followup PA and lateral chest X-ray is recommended in 3-4 weeks following trial of antibiotic therapy to ensure resolution and exclude underlying malignancy. Electronically Signed   By: Ellery Plunk M.D.   On: 12-12-15 01:14   US Abdomen Limited Ruq  12/12/2015  CLINICAL DATA:  Increased alkaline phosphatase. Patient is status post cholecystectomy 30 years ago. EXAM: US ABDOMEN LIMITED - RIGHT UPPER QUADRANT COMPARISON:  None. FINDINGS: Gallbladder: Surgically removed. Common bile duct: Diameter: 9 mm, postsurgical prominence. Liver: No focal lesion identified. Within normal limits in parenchymal echogenicity. IMPRESSION: No acute abnormality. Prior cholecystectomy. Postsurgical dilatation of common bile duct. Electronically Signed   By: Sherian Rein M.D.   On: 12/12/2015 14:29    Microbiology: No results found for this or any previous visit (from the past 240 hour(s)).   Labs: Basic Metabolic Panel: No results for input(s): NA, K, CL, CO2, GLUCOSE, BUN, CREATININE, CALCIUM, MG, PHOS in the last 168 hours. Liver Function Tests: No results for input(s): AST, ALT, ALKPHOS, BILITOT, PROT, ALBUMIN in the last 168 hours. No results for input(s): LIPASE, AMYLASE in the last 168 hours. No results for input(s): AMMONIA in the last 168 hours. CBC: No results for input(s): WBC, NEUTROABS, HGB, HCT, MCV, PLT in the last 168 hours. Cardiac Enzymes: No results for input(s): CKTOTAL, CKMB, CKMBINDEX, TROPONINI in the  last 168 hours. BNP: BNP (last 3 results)  Recent Labs  10/19/15 1829 11/13/15 0935 2015/12/12 0220  BNP 102.0* 129.0* 118.0*    ProBNP (last 3 results) No results for input(s): PROBNP in the last 8760 hours.  CBG: No results for input(s): GLUCAP in the last 168 hours.     Signed:  Jurnee Nakayama Judie Petit  [facility] Pager: [phone] 12/21/2015, 11:37 AM

## 2015-12-22 NOTE — Progress Notes (Signed)
Morphine 250 mg IV infusion wasted in sink. 125 cc wasted with Davy PiqueMary Ann Dickerson RN at [phone]. Lesly Dukesachel J Everett, RN

## 2015-12-22 DEATH — deceased

## 2017-07-18 IMAGING — RF DG SWALLOWING FUNCTION - NRPT [facility]
2 series · 18 of 24 positions shown · non-contrast
Comparison: none

[Series 1: run · 48 acquisitions, 16 frames shown (1 of 2)]
[im 1/48]
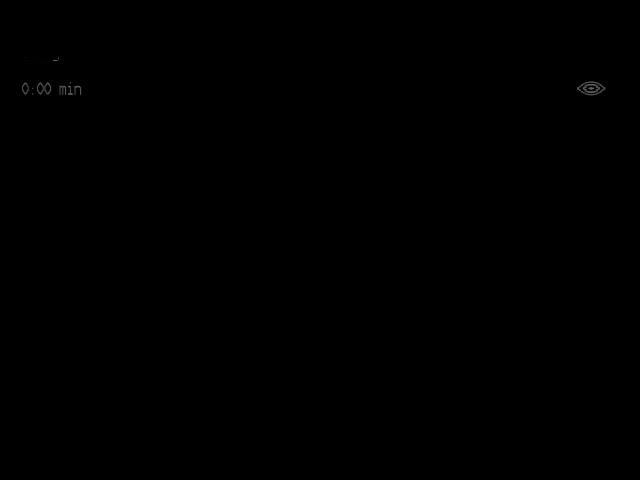
[im 5/48]
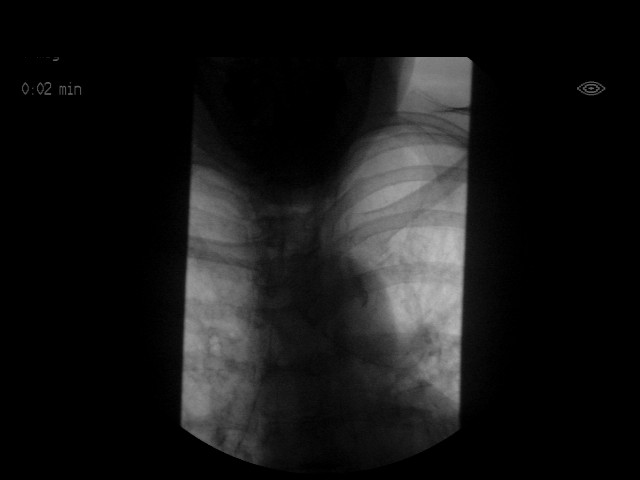
[im 7/48]
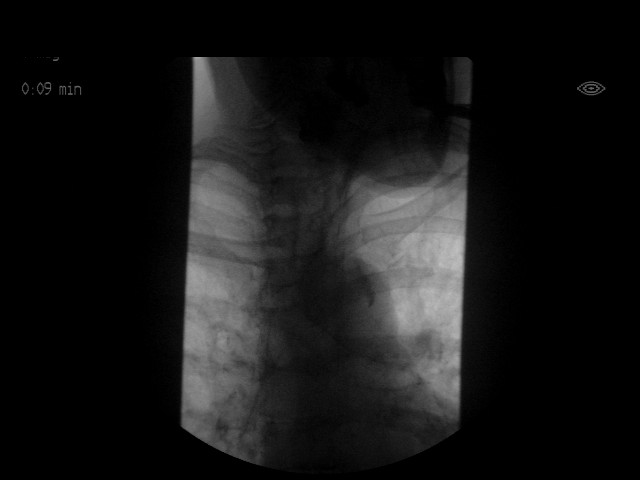
[im 9/48]
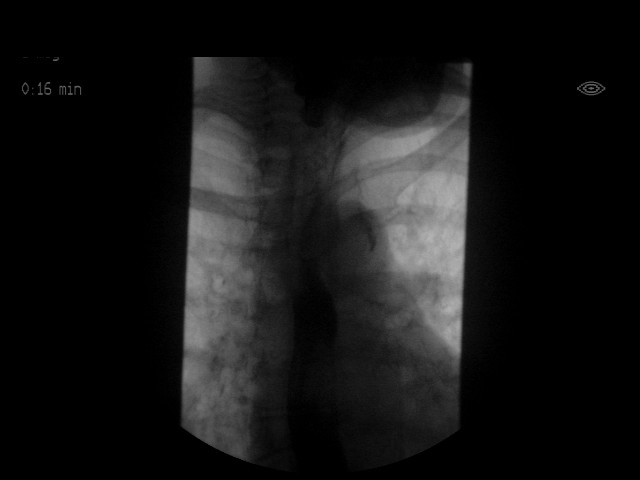
[im 14/48]
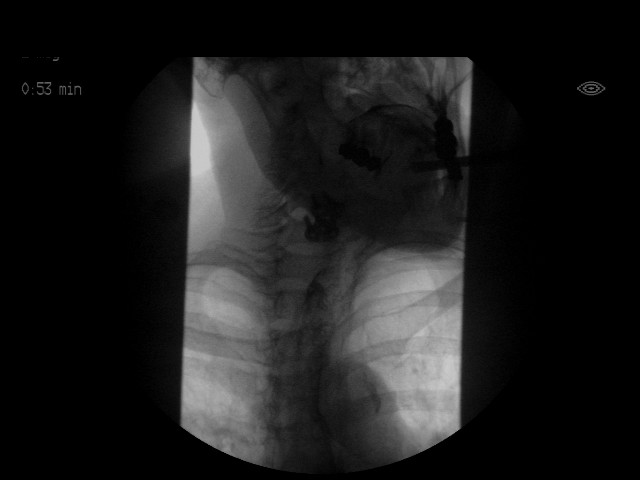
[im 16/48]
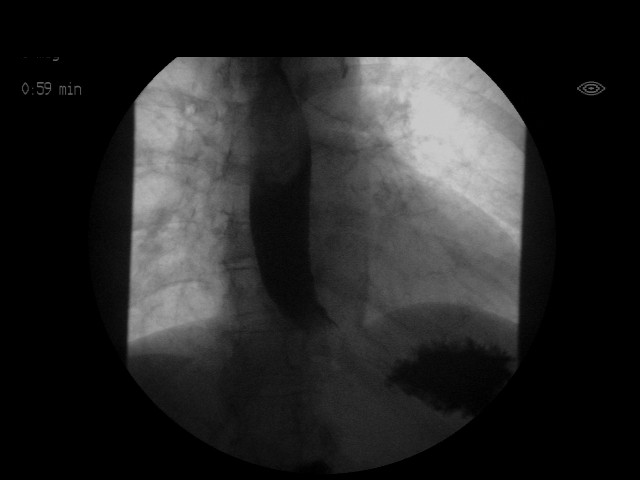
[im 18/48]
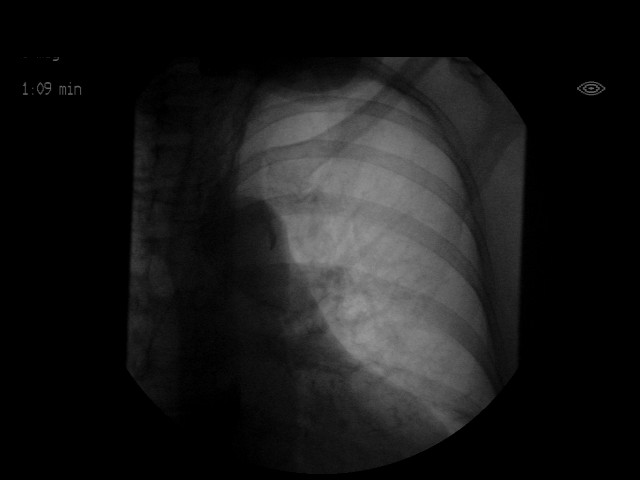
[im 23/48]
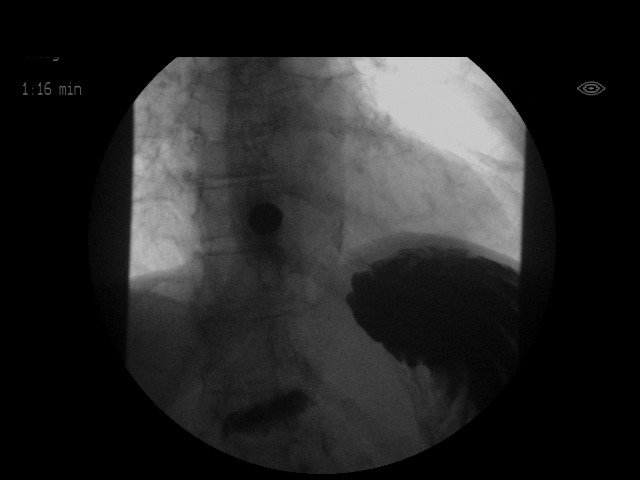
[im 25/48]
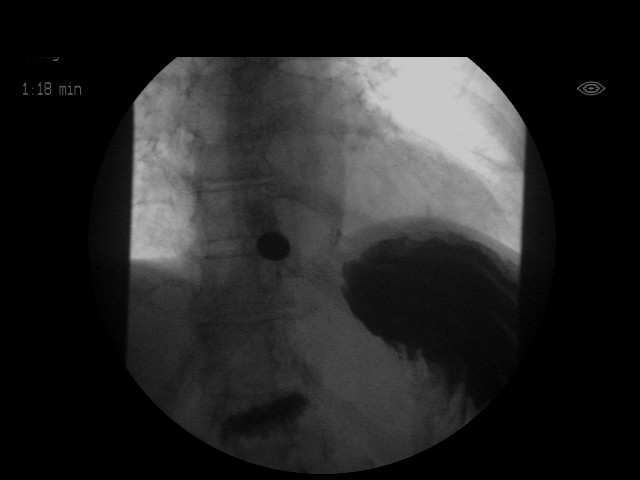
[im 27/48]
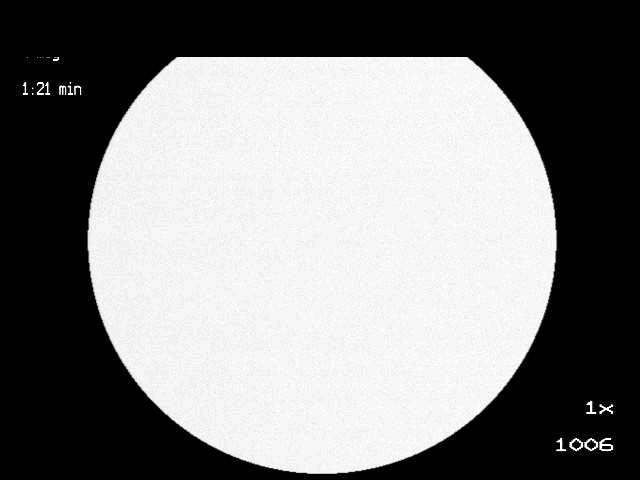
[im 32/48]
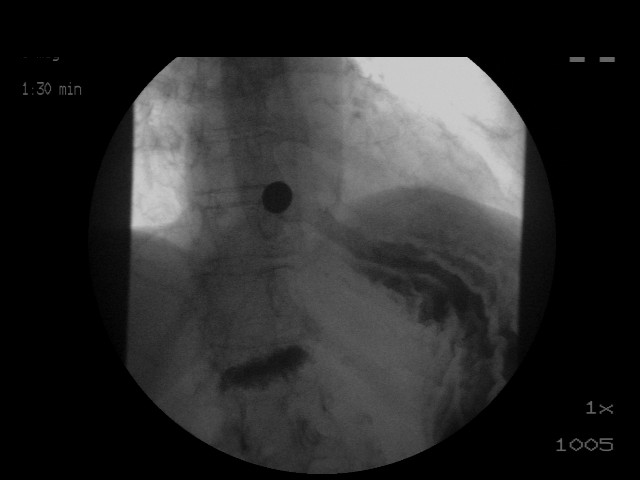
[im 34/48]
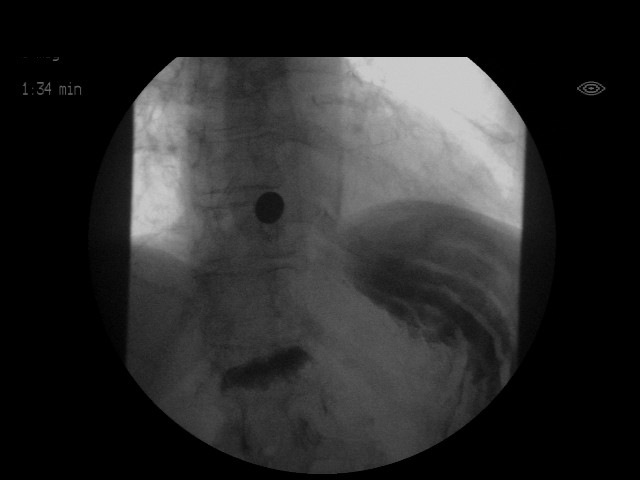
[im 36/48]
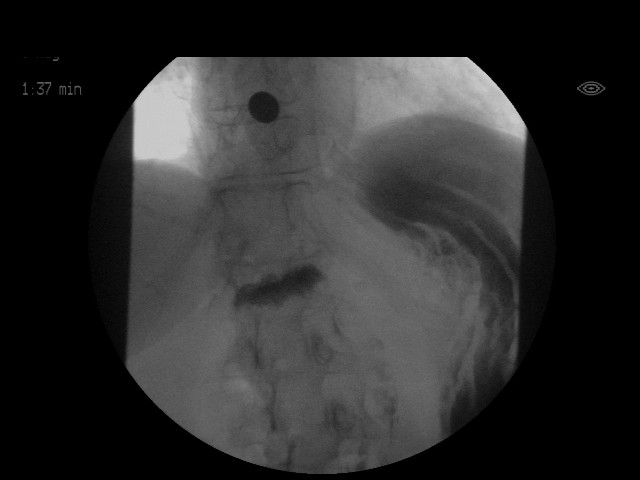
[im 41/48]
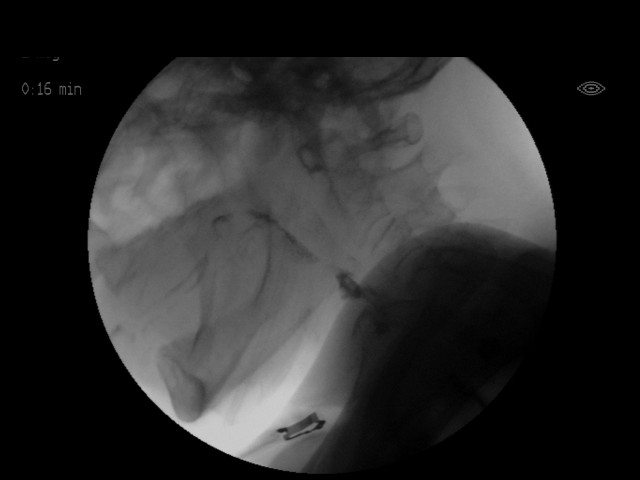
[im 43/48]
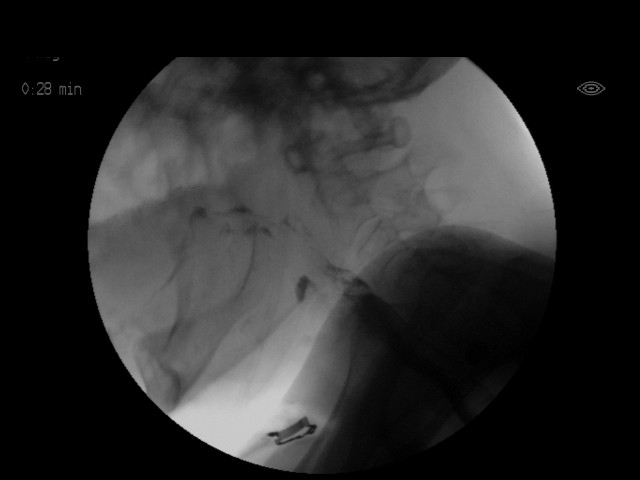
[im 45/48]
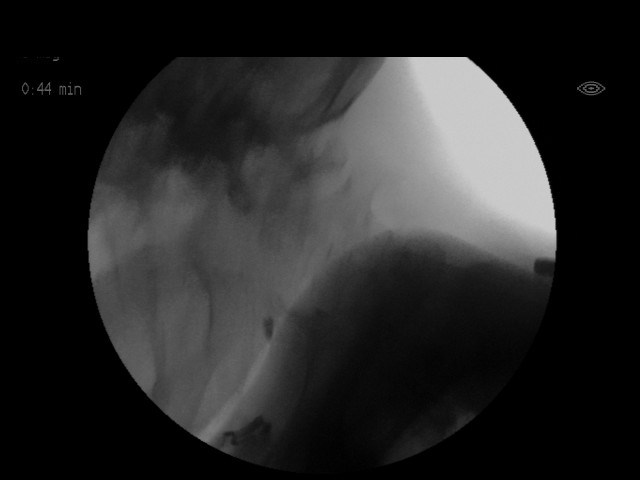

[Series 2: run · 5 acquisitions, 2 frames shown (2 of 2)]
[im 1/5]
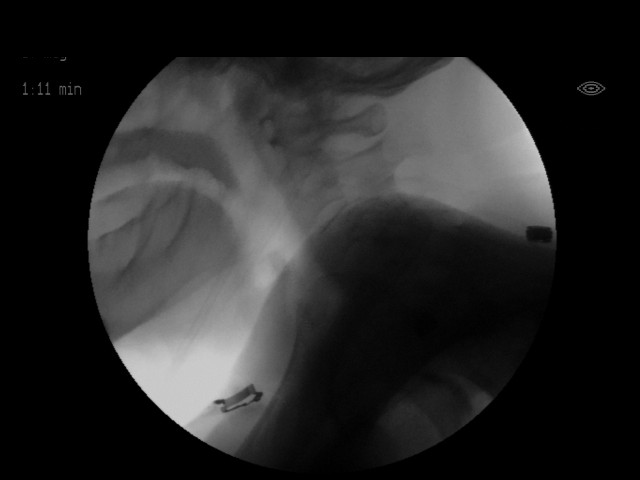
[im 5/5]
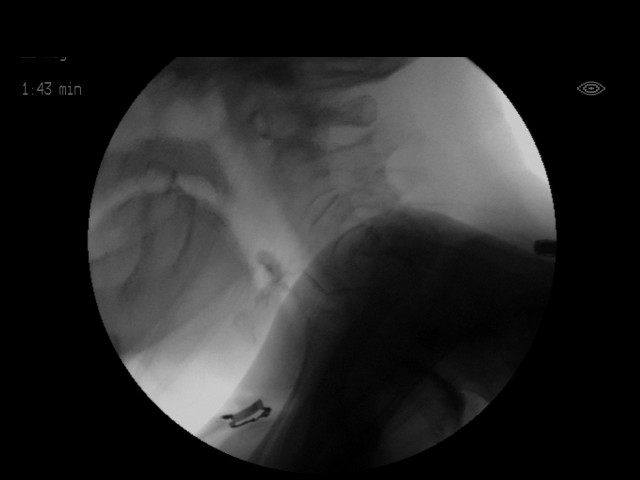

[18 of 24 positions shown; findings below may reference images not displayed]

FLUOROSCOPY FOR SWALLOWING FUNCTION STUDY:
Fluoroscopy was provided for swallowing function study, which was administered by a speech pathologist.  Final results and recommendations from this study are contained within the speech pathology report.
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
## 2017-11-15 IMAGING — US US EXTREM  UP VENOUS*L*
1 series · 13 of 22 positions shown · non-contrast
Comparison: None.

CLINICAL DATA: Left upper extremity edema for several months. Pain.



[Series 1: us extrem up venous*left* · 0.07mm/px · 13 of 22 slices shown]
[im 1/22]
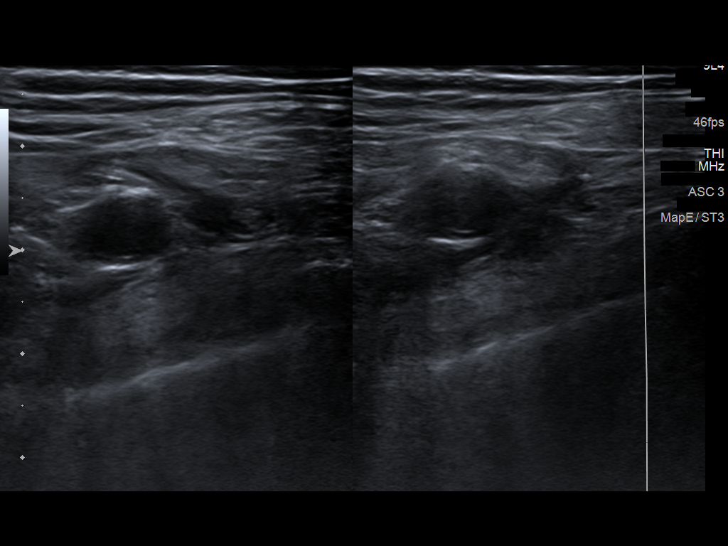
[im 3/22]
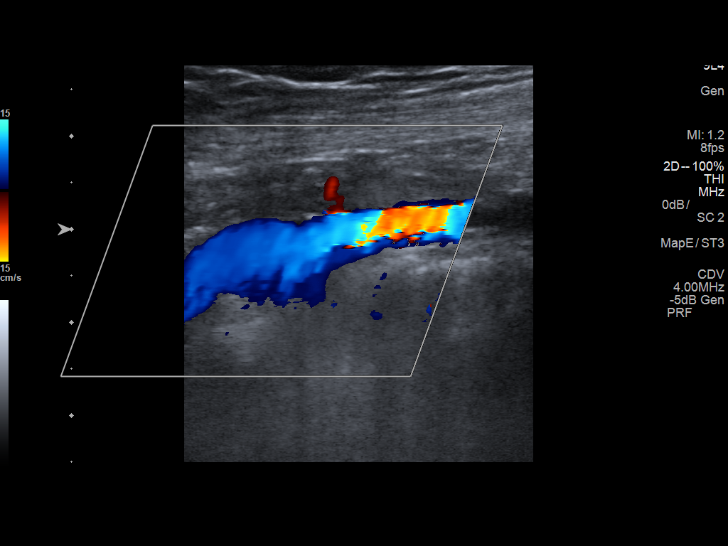
[im 5/22]
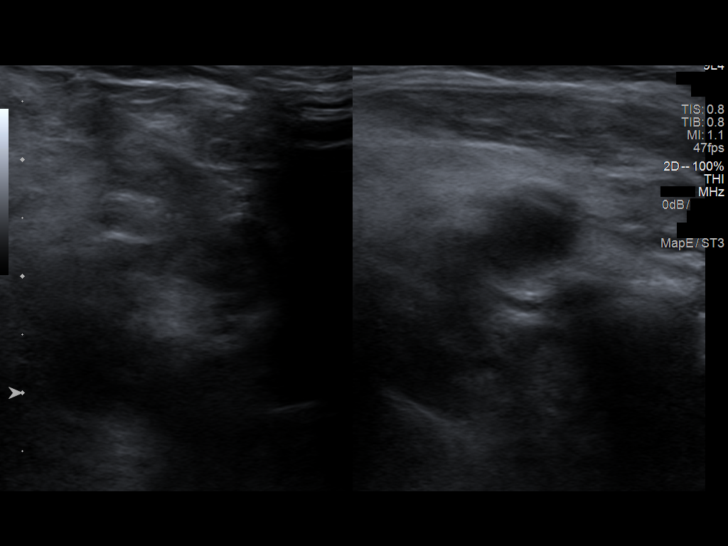
[im 6/22]
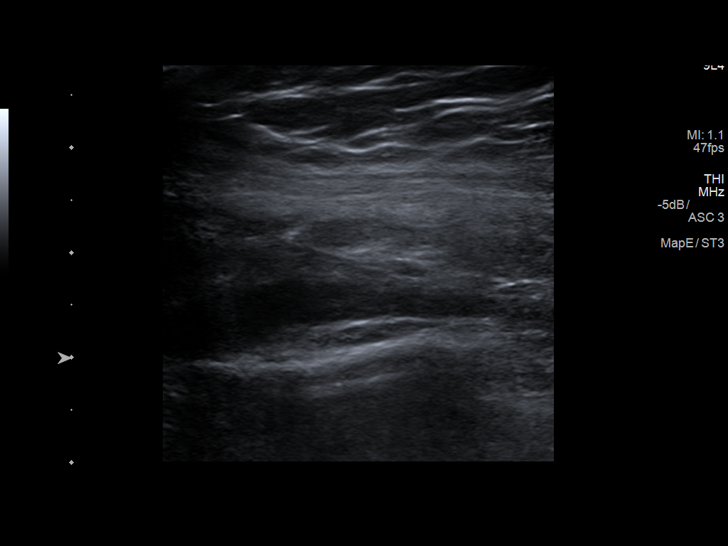
[im 8/22]
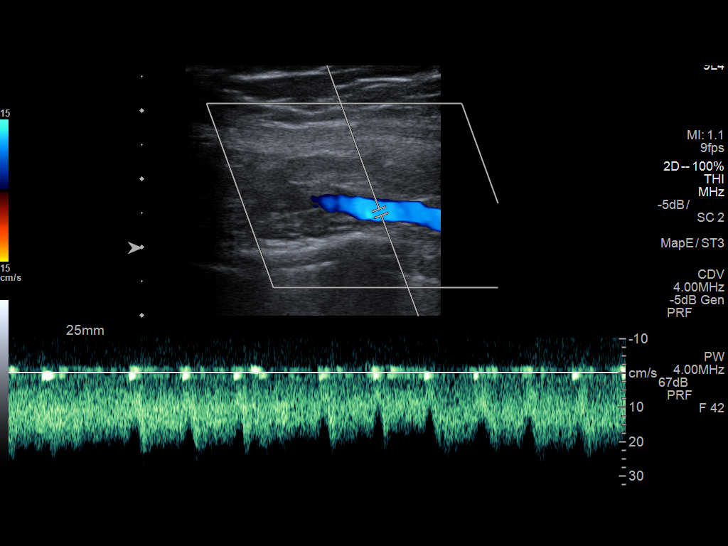
[im 10/22]
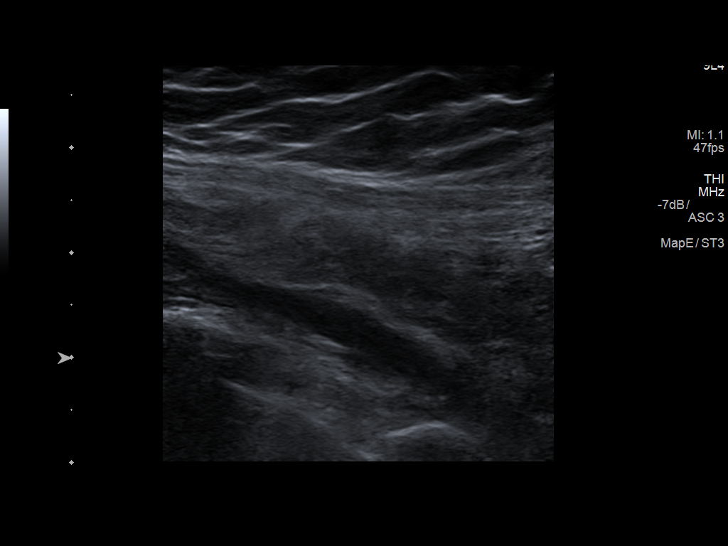
[im 12/22]
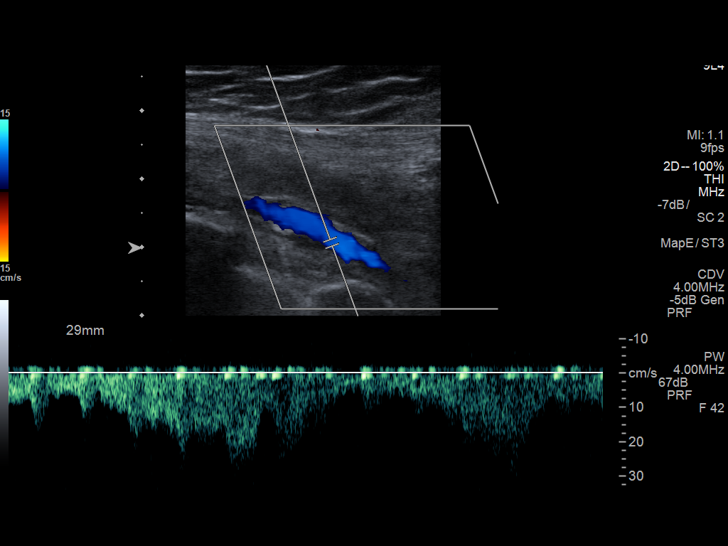
[im 13/22]
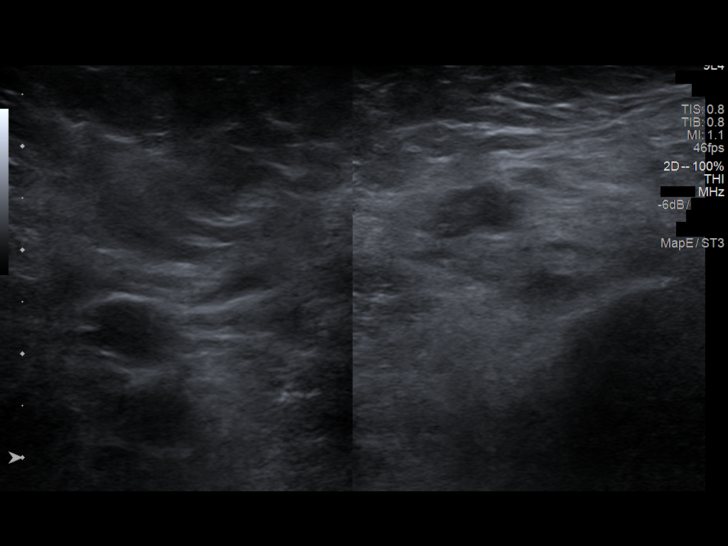
[im 15/22]
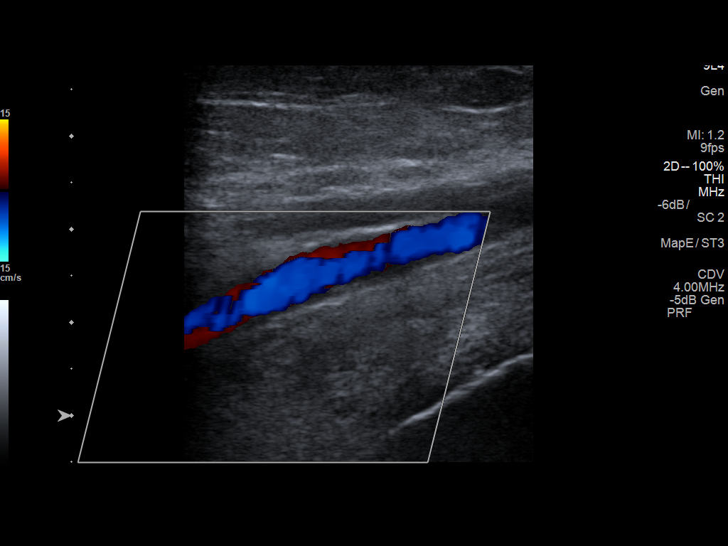
[im 17/22]
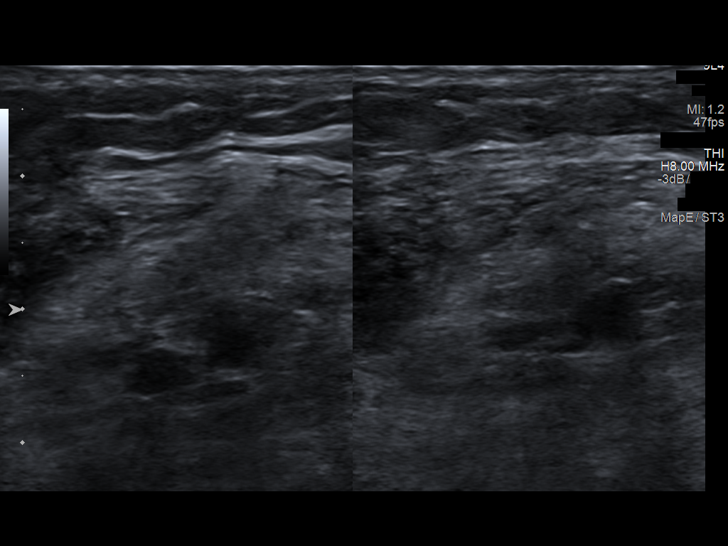
[im 18/22]
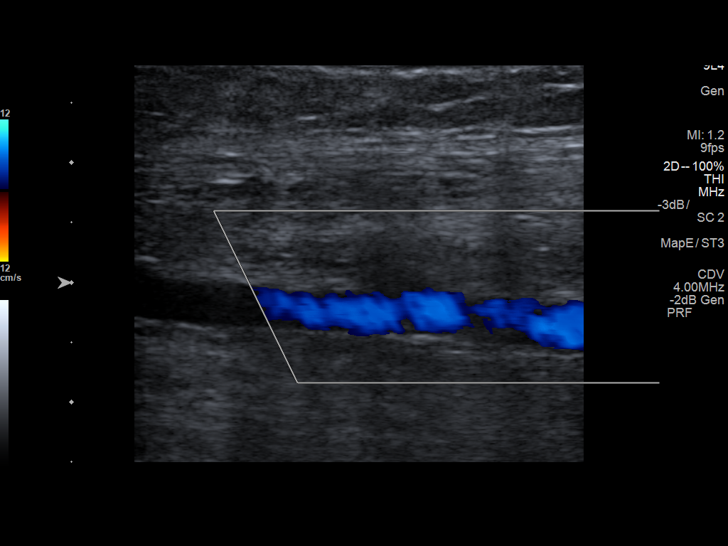
[im 20/22]
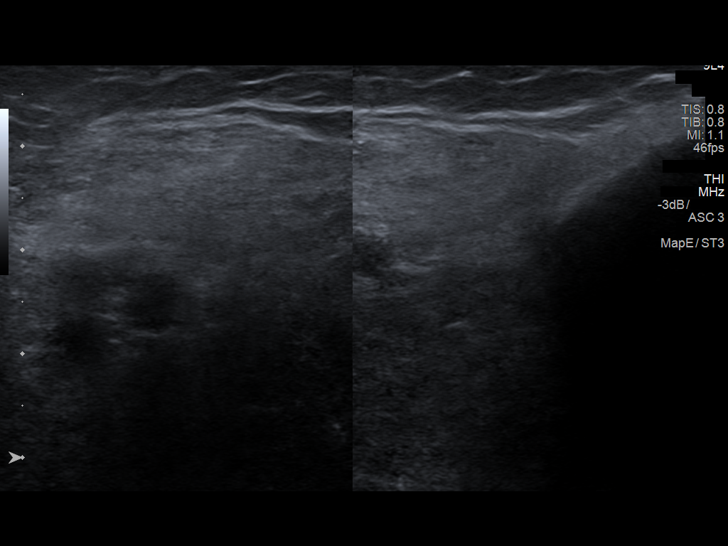
[im 22/22]
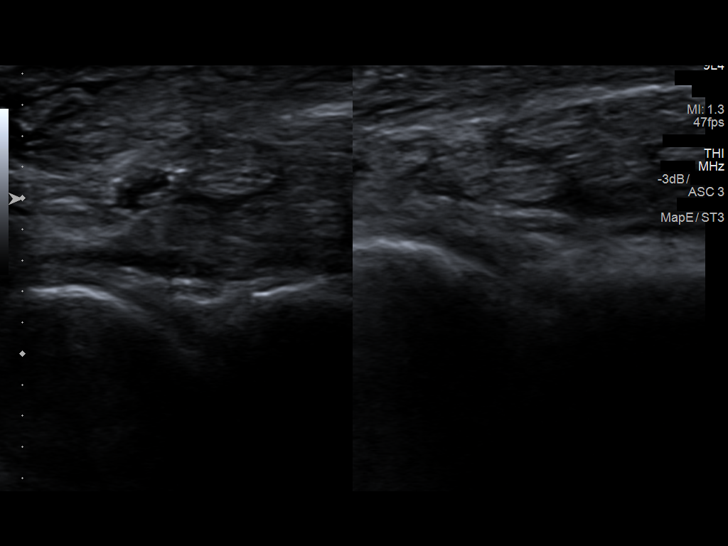

[13 of 22 positions shown; findings below may reference images not displayed]

FINDINGS: Contralateral Subclavian Vein: Respiratory phasicity is normal and
symmetric with the symptomatic side. No evidence of thrombus. Normal
compressibility.

Internal Jugular Vein: No evidence of thrombus. Normal
compressibility, respiratory phasicity and response to augmentation.

Subclavian Vein: No evidence of thrombus. Normal compressibility,
respiratory phasicity and response to augmentation.

Axillary Vein: No evidence of thrombus. Normal compressibility,
respiratory phasicity and response to augmentation.

Cephalic Vein: No evidence of thrombus. Normal compressibility,
respiratory phasicity and response to augmentation.

Basilic Vein: No evidence of thrombus. Normal compressibility,
respiratory phasicity and response to augmentation.

Brachial Veins: No evidence of thrombus. Normal compressibility,
respiratory phasicity and response to augmentation.

Radial Veins: No evidence of thrombus. Normal compressibility,
respiratory phasicity and response to augmentation.

Ulnar Veins: No evidence of thrombus. Normal compressibility,
respiratory phasicity and response to augmentation.

Venous Reflux:  None visualized.

Other Findings:  None visualized.
IMPRESSION: No evidence of deep venous thrombosis. Subcutaneous edema noted in
the forearm. This is nonspecific.
